# Patient Record
Sex: Female | Born: 1965
Health system: Southern US, Community
[De-identification: ages and names within clinical notes are randomized; demographics above are authoritative.]

## PROBLEM LIST (undated history)

## (undated) DIAGNOSIS — R059 Cough, unspecified: Secondary | ICD-10-CM

## (undated) DIAGNOSIS — D869 Sarcoidosis, unspecified: Secondary | ICD-10-CM

## (undated) DIAGNOSIS — K219 Gastro-esophageal reflux disease without esophagitis: Secondary | ICD-10-CM

## (undated) DIAGNOSIS — R05 Cough: Secondary | ICD-10-CM

## (undated) DIAGNOSIS — J45909 Unspecified asthma, uncomplicated: Secondary | ICD-10-CM

## (undated) DIAGNOSIS — I509 Heart failure, unspecified: Secondary | ICD-10-CM

## (undated) DIAGNOSIS — G43909 Migraine, unspecified, not intractable, without status migrainosus: Secondary | ICD-10-CM

## (undated) DIAGNOSIS — R0600 Dyspnea, unspecified: Secondary | ICD-10-CM

## (undated) DIAGNOSIS — Z9981 Dependence on supplemental oxygen: Secondary | ICD-10-CM

## (undated) DIAGNOSIS — I272 Pulmonary hypertension, unspecified: Secondary | ICD-10-CM

## (undated) HISTORY — PX: MULTIPLE TOOTH EXTRACTIONS: SHX2053

## (undated) HISTORY — PX: OTHER SURGICAL HISTORY: SHX169

---

## 2012-02-28 ENCOUNTER — Encounter (HOSPITAL_COMMUNITY): Payer: Self-pay | Admitting: Emergency Medicine

## 2012-02-28 ENCOUNTER — Emergency Department (INDEPENDENT_AMBULATORY_CARE_PROVIDER_SITE_OTHER)
Admission: EM | Admit: 2012-02-28 | Discharge: 2012-02-28 | Disposition: A | Discharge status: Home / Self Care | Payer: Self-pay | Source: Home / Self Care | Attending: Family Medicine | Admitting: Family Medicine

## 2012-02-28 DIAGNOSIS — J329 Chronic sinusitis, unspecified: Secondary | ICD-10-CM

## 2012-02-28 HISTORY — DX: Migraine, unspecified, not intractable, without status migrainosus: G43.909

## 2012-02-28 MED ORDER — AMOXICILLIN 500 MG PO CAPS: 500.0000 mg | ORAL_CAPSULE | Freq: Three times a day (TID) | ORAL | Status: DC

## 2012-02-28 MED ORDER — HYDROCODONE-ACETAMINOPHEN 5-325 MG PO TABS: 2.0000 | ORAL_TABLET | ORAL | Status: AC | PRN

## 2012-02-28 NOTE — ED Notes (Signed)
Pt c/o sinus pressure and pain. Since Sunday with some nasal  Drainage. Pt also c/o of gum pain left side top and bottom. Pt denies pain with teeth.

## 2012-02-28 NOTE — ED Provider Notes (Signed)
History     CSN: [id number]  Arrival date & time 02/28/12  1025   First MD Initiated Contact with Patient 02/28/12 1047      Chief Complaint  Patient presents with  . Facial Pain    sinus  pressure and left side face pain. and gum pain    (Consider location/radiation/quality/duration/timing/severity/associated sxs/prior treatment) Patient is a 46 y.o. female presenting with URI. The history is provided by the patient. No language interpreter was used.  URI The primary symptoms include headaches. Episode onset: 5 days. This is a new problem. The problem has been gradually worsening.  Symptoms associated with the illness include chills, facial pain and sinus pressure.    Past Medical History  Diagnosis Date  . Migraines     Past Surgical History  Procedure Date  . Tuballigation     History reviewed. No pertinent family history.  History  Substance Use Topics  . Smoking status: Never Smoker   . Smokeless tobacco: Not on file  . Alcohol Use: No    OB History    Grav Para Term Preterm Abortions TAB SAB Ect Mult Living                  Review of Systems  Constitutional: Positive for chills.  HENT: Positive for dental problem and sinus pressure.   Neurological: Positive for headaches.  All other systems reviewed and are negative.    Allergies  Review of patient's allergies indicates no known allergies.  Home Medications  No current outpatient prescriptions on file.  BP 129/88  Pulse 86  Temp 98.2 F (36.8 C) (Oral)  Resp 16  SpO2 98%  LMP 02/24/2012  Physical Exam  Nursing note and vitals reviewed. Constitutional: She appears well-developed and well-nourished.  HENT:  Head: Normocephalic and atraumatic.  Right Ear: External ear normal.  Left Ear: External ear normal.  Nose: Nose normal.  Mouth/Throat: Oropharynx is clear and moist.       Multiple broken teeth upper   Eyes: Conjunctivae normal and EOM are normal. Pupils are equal, round, and  reactive to light.  Neck: Normal range of motion. Neck supple.  Cardiovascular: Normal rate and regular rhythm.   Pulmonary/Chest: Effort normal and breath sounds normal.  Musculoskeletal: Normal range of motion.  Neurological: She is alert.  Skin: Skin is warm.    ED Course  Procedures (including critical care time)  Labs Reviewed - No data to display No results found.   No diagnosis found.    MDM  I will refer to dentist and treat for sinus infection        Fransico Meadow, [place] 02/28/12 1056

## 2012-07-15 ENCOUNTER — Emergency Department (HOSPITAL_COMMUNITY): Payer: No Typology Code available for payment source

## 2012-07-15 ENCOUNTER — Emergency Department (HOSPITAL_COMMUNITY)
Admission: EM | Admit: 2012-07-15 | Discharge: 2012-07-15 | Disposition: A | Discharge status: Home / Self Care | Payer: No Typology Code available for payment source | Attending: Emergency Medicine | Admitting: Emergency Medicine

## 2012-07-15 ENCOUNTER — Encounter (HOSPITAL_COMMUNITY): Payer: Self-pay | Admitting: Emergency Medicine

## 2012-07-15 DIAGNOSIS — M79642 Pain in left hand: Secondary | ICD-10-CM

## 2012-07-15 DIAGNOSIS — Y92410 Unspecified street and highway as the place of occurrence of the external cause: Secondary | ICD-10-CM | POA: Insufficient documentation

## 2012-07-15 DIAGNOSIS — Y9389 Activity, other specified: Secondary | ICD-10-CM | POA: Insufficient documentation

## 2012-07-15 DIAGNOSIS — Z8679 Personal history of other diseases of the circulatory system: Secondary | ICD-10-CM | POA: Insufficient documentation

## 2012-07-15 DIAGNOSIS — S4980XA Other specified injuries of shoulder and upper arm, unspecified arm, initial encounter: Secondary | ICD-10-CM | POA: Insufficient documentation

## 2012-07-15 DIAGNOSIS — S46909A Unspecified injury of unspecified muscle, fascia and tendon at shoulder and upper arm level, unspecified arm, initial encounter: Secondary | ICD-10-CM | POA: Insufficient documentation

## 2012-07-15 DIAGNOSIS — S6990XA Unspecified injury of unspecified wrist, hand and finger(s), initial encounter: Secondary | ICD-10-CM | POA: Insufficient documentation

## 2012-07-15 MED ORDER — NAPROXEN 500 MG PO TABS: 500.0000 mg | ORAL_TABLET | Freq: Two times a day (BID) | ORAL | Status: DC

## 2012-07-15 MED ORDER — HYDROCODONE-ACETAMINOPHEN 5-325 MG PO TABS: 2.0000 | ORAL_TABLET | ORAL | Status: DC | PRN

## 2012-07-15 MED ORDER — HYDROCODONE-ACETAMINOPHEN 5-325 MG PO TABS
2.0000 | ORAL_TABLET | Freq: Once | ORAL | Status: AC
  Administered 2012-07-15: 1 via ORAL
  Filled 2012-07-15: qty 2

## 2012-07-15 MED ORDER — CYCLOBENZAPRINE HCL 10 MG PO TABS: 10.0000 mg | ORAL_TABLET | Freq: Two times a day (BID) | ORAL | Status: DC | PRN
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
## 2012-07-15 NOTE — ED Provider Notes (Signed)
  Medical screening examination/treatment/procedure(s) were performed by non-physician practitioner and as supervising physician I was immediately available for consultation/collaboration.    Carmin Muskrat, MD 07/15/12 2121
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
## 2013-12-22 ENCOUNTER — Other Ambulatory Visit: Payer: Self-pay | Admitting: Obstetrics and Gynecology

## 2015-11-01 ENCOUNTER — Emergency Department (HOSPITAL_COMMUNITY): Payer: Medicaid Other

## 2015-11-01 ENCOUNTER — Ambulatory Visit (HOSPITAL_COMMUNITY)
Admission: EM | Admit: 2015-11-01 | Discharge: 2015-11-01 | Disposition: A | Discharge status: Nursing Home (Includes ICF) | Payer: Self-pay | Attending: Family Medicine | Admitting: Family Medicine

## 2015-11-01 ENCOUNTER — Inpatient Hospital Stay (HOSPITAL_COMMUNITY)
Admission: EM | Admit: 2015-11-01 | Discharge: 2015-11-04 | DRG: 189 | Disposition: A | Discharge status: Home / Self Care | Payer: Medicaid Other | Attending: Internal Medicine | Admitting: Internal Medicine

## 2015-11-01 ENCOUNTER — Encounter (HOSPITAL_COMMUNITY): Payer: Self-pay | Admitting: Emergency Medicine

## 2015-11-01 ENCOUNTER — Ambulatory Visit (INDEPENDENT_AMBULATORY_CARE_PROVIDER_SITE_OTHER): Payer: Self-pay

## 2015-11-01 ENCOUNTER — Encounter (HOSPITAL_COMMUNITY): Payer: Self-pay | Admitting: *Deleted

## 2015-11-01 DIAGNOSIS — R0602 Shortness of breath: Secondary | ICD-10-CM

## 2015-11-01 DIAGNOSIS — K219 Gastro-esophageal reflux disease without esophagitis: Secondary | ICD-10-CM | POA: Diagnosis present

## 2015-11-01 DIAGNOSIS — Z79899 Other long term (current) drug therapy: Secondary | ICD-10-CM

## 2015-11-01 DIAGNOSIS — J8410 Pulmonary fibrosis, unspecified: Secondary | ICD-10-CM | POA: Diagnosis present

## 2015-11-01 DIAGNOSIS — D869 Sarcoidosis, unspecified: Secondary | ICD-10-CM | POA: Diagnosis present

## 2015-11-01 DIAGNOSIS — R778 Other specified abnormalities of plasma proteins: Secondary | ICD-10-CM | POA: Diagnosis present

## 2015-11-01 DIAGNOSIS — IMO0001 Reserved for inherently not codable concepts without codable children: Secondary | ICD-10-CM

## 2015-11-01 DIAGNOSIS — J189 Pneumonia, unspecified organism: Secondary | ICD-10-CM

## 2015-11-01 DIAGNOSIS — Z87891 Personal history of nicotine dependence: Secondary | ICD-10-CM

## 2015-11-01 DIAGNOSIS — J45909 Unspecified asthma, uncomplicated: Secondary | ICD-10-CM

## 2015-11-01 DIAGNOSIS — D860 Sarcoidosis of lung: Secondary | ICD-10-CM

## 2015-11-01 DIAGNOSIS — J9601 Acute respiratory failure with hypoxia: Principal | ICD-10-CM | POA: Diagnosis present

## 2015-11-01 DIAGNOSIS — I248 Other forms of acute ischemic heart disease: Secondary | ICD-10-CM | POA: Diagnosis present

## 2015-11-01 DIAGNOSIS — E440 Moderate protein-calorie malnutrition: Secondary | ICD-10-CM | POA: Diagnosis present

## 2015-11-01 DIAGNOSIS — R7989 Other specified abnormal findings of blood chemistry: Secondary | ICD-10-CM

## 2015-11-01 DIAGNOSIS — Z23 Encounter for immunization: Secondary | ICD-10-CM

## 2015-11-01 DIAGNOSIS — I272 Other secondary pulmonary hypertension: Secondary | ICD-10-CM | POA: Diagnosis present

## 2015-11-01 DIAGNOSIS — Z8249 Family history of ischemic heart disease and other diseases of the circulatory system: Secondary | ICD-10-CM

## 2015-11-01 HISTORY — DX: Sarcoidosis, unspecified: D86.9

## 2015-11-01 HISTORY — DX: Gastro-esophageal reflux disease without esophagitis: K21.9

## 2015-11-01 LAB — CBC WITH DIFFERENTIAL/PLATELET
BASOS ABS: 0 10*3/uL (ref 0.0–0.1)
BASOS PCT: 0 %
EOS ABS: 0.1 10*3/uL (ref 0.0–0.7)
EOS PCT: 1 %
HEMATOCRIT: 48.7 % — AB (ref 36.0–46.0)
HEMOGLOBIN: 16 g/dL — AB (ref 12.0–15.0)
Lymphocytes Relative: 13 %
Lymphs Abs: 1.3 10*3/uL (ref 0.7–4.0)
MCH: 28.8 pg (ref 26.0–34.0)
MCHC: 32.9 g/dL (ref 30.0–36.0)
MCV: 87.6 fL (ref 78.0–100.0)
Monocytes Absolute: 0.6 10*3/uL (ref 0.1–1.0)
Monocytes Relative: 6 %
NEUTROS PCT: 80 %
Neutro Abs: 7.9 10*3/uL — ABNORMAL HIGH (ref 1.7–7.7)
Platelets: 397 10*3/uL (ref 150–400)
RBC: 5.56 MIL/uL — AB (ref 3.87–5.11)
RDW: 14.1 % (ref 11.5–15.5)
WBC: 9.9 10*3/uL (ref 4.0–10.5)

## 2015-11-01 LAB — I-STAT TROPONIN, ED: TROPONIN I, POC: 0.02 ng/mL (ref 0.00–0.08)

## 2015-11-01 LAB — BASIC METABOLIC PANEL
Anion gap: 10 (ref 5–15)
BUN: 10 mg/dL (ref 6–20)
CALCIUM: 9.1 mg/dL (ref 8.9–10.3)
CHLORIDE: 105 mmol/L (ref 101–111)
CO2: 21 mmol/L — AB (ref 22–32)
CREATININE: 1.01 mg/dL — AB (ref 0.44–1.00)
GFR calc Af Amer: >60 mL/min (ref >60)
GLUCOSE: 121 mg/dL — AB (ref 65–99)
Potassium: 3.9 mmol/L (ref 3.5–5.1)
Sodium: 136 mmol/L (ref 135–145)

## 2015-11-01 LAB — TROPONIN I: TROPONIN I: 0.05 ng/mL — AB (ref <0.03)

## 2015-11-01 MED ORDER — METHYLPREDNISOLONE SODIUM SUCC 125 MG IJ SOLR
125.0000 mg | Freq: Once | INTRAMUSCULAR | Status: AC
  Administered 2015-11-01: 125 mg via INTRAMUSCULAR

## 2015-11-01 MED ORDER — IOPAMIDOL (ISOVUE-370) INJECTION 76%
INTRAVENOUS | Status: AC
Start: 2015-11-01 — End: 2015-11-01
  Administered 2015-11-01: 100 mL
  Filled 2015-11-01: qty 100

## 2015-11-01 MED ORDER — ALBUTEROL SULFATE (2.5 MG/3ML) 0.083% IN NEBU
5.0000 mg | INHALATION_SOLUTION | Freq: Once | RESPIRATORY_TRACT | Status: AC
  Administered 2015-11-01: 5 mg via RESPIRATORY_TRACT
  Filled 2015-11-01: qty 6

## 2015-11-01 MED ORDER — GI COCKTAIL ~~LOC~~
30.0000 mL | Freq: Once | ORAL | Status: AC
  Administered 2015-11-01: 30 mL via ORAL

## 2015-11-01 MED ORDER — DEXTROSE 5 % IV SOLN
500.0000 mg | Freq: Once | INTRAVENOUS | Status: AC
  Administered 2015-11-02: 500 mg via INTRAVENOUS
  Filled 2015-11-01: qty 500

## 2015-11-01 MED ORDER — IPRATROPIUM BROMIDE 0.02 % IN SOLN
0.5000 mg | Freq: Once | RESPIRATORY_TRACT | Status: AC
  Administered 2015-11-01: 0.5 mg via RESPIRATORY_TRACT

## 2015-11-01 MED ORDER — METHYLPREDNISOLONE SODIUM SUCC 125 MG IJ SOLR
125.0000 mg | Freq: Once | INTRAMUSCULAR | Status: AC
  Administered 2015-11-02: 125 mg via INTRAVENOUS
  Filled 2015-11-01: qty 2

## 2015-11-01 MED ORDER — GI COCKTAIL ~~LOC~~
ORAL | Status: AC
  Filled 2015-11-01: qty 30

## 2015-11-01 MED ORDER — DEXTROSE 5 % IV SOLN
1.0000 g | Freq: Once | INTRAVENOUS | Status: AC
  Administered 2015-11-02: 1 g via INTRAVENOUS
  Filled 2015-11-01: qty 10

## 2015-11-01 MED ORDER — METHYLPREDNISOLONE SODIUM SUCC 125 MG IJ SOLR
INTRAMUSCULAR | Status: AC
  Filled 2015-11-01: qty 2

## 2015-11-01 MED ORDER — ALBUTEROL SULFATE (2.5 MG/3ML) 0.083% IN NEBU
INHALATION_SOLUTION | RESPIRATORY_TRACT | Status: AC
  Filled 2015-11-01: qty 6

## 2015-11-01 MED ORDER — ALBUTEROL SULFATE (2.5 MG/3ML) 0.083% IN NEBU
5.0000 mg | INHALATION_SOLUTION | Freq: Once | RESPIRATORY_TRACT | Status: AC
  Administered 2015-11-01: 5 mg via RESPIRATORY_TRACT

## 2015-11-01 MED ORDER — IPRATROPIUM BROMIDE 0.02 % IN SOLN
RESPIRATORY_TRACT | Status: AC
  Filled 2015-11-01: qty 2.5

## 2015-11-01 NOTE — Progress Notes (Signed)
RT called to pt's room for low spo2/increased HR. Pt on NRB at 10L when RT got to room with spo2 100%. Pt denies SOB at this time. Pt taken off NRB and placed on 5L [place] with spo2 remaining > 97%. RN at bedside and aware. Pt with clear/diminished BS throughout, RR approximately 20/min. RT will continue to monitor pt.

## 2015-11-01 NOTE — ED Notes (Signed)
Pt. Brought back from triage with report of RA sats around 78. 2L [place] applied, brought sats up to mid 80's, low 90's Non rebreather applied, 10L O2, respiratory called, Dr. Venora Maples notified. Respiratory therapist applied 5L [place], sats at 98%

## 2015-11-01 NOTE — ED Triage Notes (Signed)
Pt sts increased SOB over last couple of days; pt O2 sats noted to be 78% on RA

## 2015-11-01 NOTE — ED Notes (Signed)
Respiratory at bedside.

## 2015-11-01 NOTE — H&P (Signed)
History and Physical    Erin Zavala BUL:[id number] DOB: 1965-11-11 DOA: 11/01/2015  Referring MD/NP/PA:   PCP: No PCP Per Patient   Patient coming from:  The patient is coming from home.  At baseline, pt is independent for most of ADL.  Chief Complaint: Shortness of breath, cough  HPI: Erin Zavala is a 50 y.o. female with medical history significant of GERD, migraine headache, sarcoidosis, who presents with shortness of breath and cough  Patient reports that she has been having cough and shortness of breath for about 1 week, which has been progressively getting worse. No chest pain. She coughs up little amount of clear mucus. No fever, chills. Patient has a acid reflux symptoms of heartburn. She denies nausea, vomiting, abdominal pain, diarrhea, symptoms of UTI or unilateral weakness. Patient states that she was diagnosed as sarcoidosis in 1997 when she was treated for eye problems. Her vision has improved. Currently not on any medications for sarcoidosis.  ED Course: pt was found to have WBC 9.9, temperature normal, tachycardia, tachypnea, oxygen desaturation to 82% on room air, electrolytes and renal function okay. Chest x-ray the interstitial opacity. ABG with pH of 7.412, PCO2 34.7, PO2 121. Pt is admitted to telemetry bed as inpatient for further eval and treatment.  # CTA of the chest is negative for PE, but showed thoracic adenopathy and pulmonary fibrosis correlating with sarcoidosis history. Few patchy airspace opacities are likely related to the patient's chronic lung disease, but would correlate for pneumonia symptoms; probable pulmonary hypertension, presumably related to the fibrosis.  Review of Systems:   General: no fevers, chills, has loss of body weight, has poor appetite, has fatigue HEENT: no blurry vision, hearing changes or sore throat Respiratory: has dyspnea, coughing, no wheezing CV: no chest pain, no palpitations GI: no nausea, vomiting, abdominal pain, diarrhea,  constipation GU: no dysuria, burning on urination, increased urinary frequency, hematuria  Ext: no leg edema Neuro: no unilateral weakness, numbness, or tingling, no vision change or hearing loss Skin: no rash MSK: No muscle spasm, no deformity, no limitation of range of movement in spin Heme: No easy bruising.  Travel history: No recent long distant travel.  Allergy: No Known Allergies  Past Medical History:  Diagnosis Date  . GERD (gastroesophageal reflux disease)   . Migraines   . Sarcoidosis [facility])     Past Surgical History:  Procedure Laterality Date  . tuballigation      Social History:  reports that she has never smoked. She has never used smokeless tobacco. She reports that she does not drink alcohol or use drugs.  Family History:  Family History  Problem Relation Age of Onset  . Hypertension Mother   . Hypertension Father      Prior to Admission medications   Medication Sig Start Date End Date Taking? Authorizing Provider  cyclobenzaprine (FLEXERIL) 10 MG tablet Take 1 tablet (10 mg total) by mouth 2 (two) times daily as needed for muscle spasms. 07/15/12   Hannah Muthersbaugh, PA-C  HYDROcodone-acetaminophen (NORCO/VICODIN) 5-325 MG per tablet Take 2 tablets by mouth every 4 (four) hours as needed for pain. 07/15/12   Hannah Muthersbaugh, PA-C  naproxen (NAPROSYN) 500 MG tablet Take 1 tablet (500 mg total) by mouth 2 (two) times daily with a meal. 07/15/12   Hannah Muthersbaugh, PA-C  naproxen sodium (ANAPROX) 220 MG tablet Take 220 mg by mouth as needed (pain).    Historical Provider, MD    Physical Exam: Vitals:   11/02/15 0000 11/02/15 0002  11/02/15 0045 11/02/15 0136  BP: 109/90 115/84 118/87 119/87  Pulse: 93 103 97 99  Resp: 16 (!) 28  18  Temp:    97.5 F (36.4 C)  TempSrc:    Oral  SpO2: 100% 98% 100% 100%  Weight:    45.7 kg (100 lb 11.2 oz)   General: Not in acute distress HEENT:       Eyes: PERRL, EOMI, no scleral icterus.       ENT: No discharge  from the ears and nose, no pharynx injection, no tonsillar enlargement.        Neck: No JVD, no bruit, no mass felt. Heme: No neck lymph node enlargement. Cardiac: S1/S2, RRR, No murmurs, No gallops or rubs. Respiratory: Has decreased air movement bilaterally (L is worse). No rales, wheezing, rhonchi or rubs. GI: Soft, nondistended, nontender, no rebound pain, no organomegaly, BS present. GU: No hematuria Ext: No pitting leg edema bilaterally. 2+DP/PT pulse bilaterally. Musculoskeletal: No joint deformities, No joint redness or warmth, no limitation of ROM in spin. Skin: No rashes.  Neuro: Alert, oriented X3, cranial nerves II-XII grossly intact, moves all extremities normally. Psych: Patient is not psychotic, no suicidal or hemocidal ideation.  Labs on Admission: I have personally reviewed following labs and imaging studies  CBC:  Recent Labs Lab 11/01/15 1800  WBC 9.9  NEUTROABS 7.9*  HGB 16.0*  HCT 48.7*  MCV 87.6  PLT 387   Basic Metabolic Panel:  Recent Labs Lab 11/01/15 1800  NA 136  K 3.9  CL 105  CO2 21*  GLUCOSE 121*  BUN 10  CREATININE 1.01*  CALCIUM 9.1   GFR: Estimated Creatinine Clearance: 48.4 mL/min (by C-G formula based on SCr of 1.01 mg/dL). Liver Function Tests: No results for input(s): AST, ALT, ALKPHOS, BILITOT, PROT, ALBUMIN in the last 168 hours. No results for input(s): LIPASE, AMYLASE in the last 168 hours. No results for input(s): AMMONIA in the last 168 hours. Coagulation Profile:  Recent Labs Lab 11/02/15 0107  INR 1.12   Cardiac Enzymes:  Recent Labs Lab 11/01/15 1800 11/02/15 0107  TROPONINI 0.05* 0.04*   BNP (last 3 results) No results for input(s): PROBNP in the last 8760 hours. HbA1C: No results for input(s): HGBA1C in the last 72 hours. CBG: No results for input(s): GLUCAP in the last 168 hours. Lipid Profile: No results for input(s): CHOL, HDL, LDLCALC, TRIG, CHOLHDL, LDLDIRECT in the last 72 hours. Thyroid  Function Tests: No results for input(s): TSH, T4TOTAL, FREET4, T3FREE, THYROIDAB in the last 72 hours. Anemia Panel: No results for input(s): VITAMINB12, FOLATE, FERRITIN, TIBC, IRON, RETICCTPCT in the last 72 hours. Urine analysis: No results found for: COLORURINE, APPEARANCEUR, LABSPEC, PHURINE, GLUCOSEU, HGBUR, BILIRUBINUR, KETONESUR, PROTEINUR, UROBILINOGEN, NITRITE, LEUKOCYTESUR Sepsis Labs: _0 (procalcitonin:4,lacticidven:4) )No results found for this or any previous visit (from the past 240 hour(s)).   Radiological Exams on Admission: Dg Chest 2 View  Result Date: 11/01/2015 CLINICAL DATA:  Shortness of breath.  History of sarcoidosis. EXAM: CHEST  2 VIEW COMPARISON:  Chest radiograph 11/01/2015 FINDINGS: Superior mediastinal and hilar prominence may indicate underlying adenopathy. There are extensive interstitial opacities, predominantly within the upper lungs. No focal consolidation. No pneumothorax or pleural effusion. IMPRESSION: Interstitial opacities and likely mediastinal adenopathy, likely secondary to known sarcoidosis. Electronically Signed   By: Ulyses Jarred M.D.   On: 11/01/2015 19:36   Dg Chest 2 View  Result Date: 11/01/2015 CLINICAL DATA:  Shortness of breath, cough, history of asthma and sarcoidosis EXAM: CHEST  2 VIEW COMPARISON:  None. FINDINGS: Increased interstitial markings, upper lobe and left lower lobe predominant, likely corresponding to known sarcoidosis. No definite focal consolidation to suggest superimposed pneumonia. No pleural effusion or pneumothorax. Heart is normal in size. Visualized osseous structures are within normal limits. IMPRESSION: Suspected chronic interstitial markings related to known sarcoidosis. No findings suspicious for pneumonia. Electronically Signed   By: Julian Hy M.D.   On: 11/01/2015 16:31   Ct Angio Chest Pe W And/or Wo Contrast  Result Date: 11/01/2015 CLINICAL DATA:  Shortness of breath and tachycardia. History of  sarcoidosis. EXAM: CT ANGIOGRAPHY CHEST WITH CONTRAST TECHNIQUE: Multidetector CT imaging of the chest was performed using the standard protocol during bolus administration of intravenous contrast. Multiplanar CT image reconstructions and MIPs were obtained to evaluate the vascular anatomy. CONTRAST:  100 cc Isovue 370 intravenous COMPARISON:  None. FINDINGS: Cardiovascular: No cardiomegaly. No pericardial effusion. Enlarged main pulmonary artery measuring up to 31 mm, likely pulmonary hypertension. Study optimized for evaluation of the pulmonary arterial tree. No evidence of pulmonary embolism. Negative thoracic aorta. Mediastinum: Bulky mediastinal and hilar adenopathy. The subcarinal node is large enough to deform the left atrium. Findings attributed to patient's sarcoidosis history. Lungs/Pleura: Low volume lungs with apical predominant fibrotic interstitial opacities with central honeycombing. The lungs are diffusely ground-glass in appearance without Kerley lines are septal thickening. There are few patchy airspace type opacities in the right upper and right lower lobes. These could be fibrotic in nature but would correlate for any pneumonia symptoms. Upper abdomen: No acute findings. Hepatic artery branches are prominent and somewhat tortuous but there is no visible cirrhotic changes. Musculoskeletal: No chest wall mass or suspicious bone lesions identified. Review of the MIP images confirms the above findings. IMPRESSION: 1. Negative for pulmonary embolism. 2. Thoracic adenopathy and pulmonary fibrosis correlating with sarcoidosis history. Few patchy airspace opacities are likely related to the patient's chronic lung disease, but would correlate for pneumonia symptoms. 3. Probable pulmonary hypertension, presumably related to the fibrosis. Electronically Signed   By: Monte Fantasia M.D.   On: 11/01/2015 23:27     EKG: Independently reviewed. Sinus rhythm, QTC 456, T-wave inversion in V1-V5, and  III/aVF  Assessment/Plan Principal Problem:   Acute respiratory failure with hypoxia (HCC) Active Problems:   Sarcoidosis (HCC)   Elevated troponin   Pulmonary sarcoidosis (HCC)   GERD (gastroesophageal reflux disease)   Protein-calorie malnutrition, moderate (HCC)   Acute respiratory failure with hypoxia ([place]): Etiology is not clear. CTA of chest is negative for PE, but showed pulmonary fibrosis correlating with history of sarcoidosis and patchy airspace opacities. Patient does not have leukocytosis or fever, clinically does not have pneumonia, but cannot rule out atypical infection completely.  - Will admit to tele bed as inpt - IV Rocephin and azithromycin were given in ED, will keep azithromycin, and discontinue Rocephin - Mucinex for cough  - Solu-Medrol 60 mg twice a day - Atrovent nebs and prn Xopenex Neb prn for SOB - Urine legionella and S. pneumococcal antigen - Follow up blood culture x2, sputum culture and respiratory virus panel   Sarcoidosis: Patient states that she had vision disturbance initially when she was diagnosed on 1997. Her eye problem has resolved. She is taking any medications for sarcoidosis. No follow-up in the past. 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 Calcium 9.1 on admission - may consult to pulmonology in morning  Elevated troponin: Troponin 0.05. No chest pain, but has diffused T-wave inversion on EKG. Likely  due to demand ischemia secondary to tachycardia and hypoxia, I cannot rule out NSTEMI completely. - cycle CE q6 x3 and repeat her EKG in the am  - Aspirin - Risk factor stratification: will check FLP, UDS and A1C  - 2d echo - please call Card in AM  GERD: -Protonix  Protein-calorie malnutrition, moderate ([place]): -Consultation  DVT ppx:  SQ Lovenox Code Status: Full code Family Communication: Yes, patient's daughter and niece at bed side Disposition Plan:  Anticipate discharge back to previous home environment Consults  called:  none Admission status: Inpatient/tele  Date of Service 11/02/2015    Ivor Costa [facility] Pager [phone]  If 7PM-7AM, please contact night-coverage [email or web address] Password [facility] 11/02/2015, 2:27 AM

## 2015-11-01 NOTE — ED Notes (Signed)
Dr. Venora Maples at bedside

## 2015-11-01 NOTE — ED Provider Notes (Signed)
[place]    CSN: [id number] Arrival date & time: 11/01/15  1539  First Provider Contact:  First MD Initiated Contact with Patient 11/01/15 1603        History   Chief Complaint Chief Complaint  Patient presents with  . Cough    HPI Erin Zavala is a 50 y.o. female.    Shortness of Breath  Severity:  Moderate Onset quality:  Gradual Duration:  2 weeks Progression:  Worsening Chronicity:  Chronic Context comment:  Sx worse with gerd. just finished course of nexium Ineffective treatments:  Inhaler Associated symptoms: no fever and no wheezing   Risk factors comment:  H/o sarcoidosis   Past Medical History:  Diagnosis Date  . Migraines     There are no active problems to display for this patient.   Past Surgical History:  Procedure Laterality Date  . tuballigation      OB History    No data available       Home Medications    Prior to Admission medications   Medication Sig Start Date End Date Taking? Authorizing Provider  cyclobenzaprine (FLEXERIL) 10 MG tablet Take 1 tablet (10 mg total) by mouth 2 (two) times daily as needed for muscle spasms. 07/15/12   Hannah Muthersbaugh, PA-C  HYDROcodone-acetaminophen (NORCO/VICODIN) 5-325 MG per tablet Take 2 tablets by mouth every 4 (four) hours as needed for pain. 07/15/12   Hannah Muthersbaugh, PA-C  naproxen (NAPROSYN) 500 MG tablet Take 1 tablet (500 mg total) by mouth 2 (two) times daily with a meal. 07/15/12   Hannah Muthersbaugh, PA-C  naproxen sodium (ANAPROX) 220 MG tablet Take 220 mg by mouth as needed (pain).    Historical Provider, MD    Family History Family History  Problem Relation Age of Onset  . Hypertension Mother   . Hypertension Father     Social History Social History  Substance Use Topics  . Smoking status: Never Smoker  . Smokeless tobacco: Not on file  . Alcohol use No     Allergies   Review of patient's allergies indicates no known allergies.   Review of  Systems Review of Systems  Constitutional: Negative for fever.  Respiratory: Positive for shortness of breath. Negative for wheezing.   Cardiovascular: Negative.   Gastrointestinal: Negative.   All other systems reviewed and are negative.    Physical Exam Triage Vital Signs ED Triage Vitals [11/01/15 1559]  Enc Vitals Group     BP 115/81     Pulse Rate (!) 130     Resp 22     Temp 97.5 F (36.4 C)     Temp Source Oral     SpO2 (!) 87 %     Weight 102 lb (46.3 kg)     Height 5' (1.524 m)     Head Circumference      Peak Flow      Pain Score      Pain Loc      Pain Edu?      Excl. in [place]?    No data found.   Updated Vital Signs BP 115/81 (BP Location: Left Arm)   Pulse (!) 130 Comment: checked both hands  Temp 97.5 F (36.4 C) (Oral)   Resp 22 Comment: breathing swallow and fast, Pt states unable to take a normal breath or she'll start to cough.  Ht 5' (1.524 m)   Wt 102 lb (46.3 kg)   LMP 09/11/2015 Comment: perimenapausal  SpO2 (!) 87% Comment:  RN Nicole Kindred informed of Pts Spo2 highest reading from both hands  BMI 19.92 kg/m   Visual Acuity Right Eye Distance:   Left Eye Distance:   Bilateral Distance:    Right Eye Near:   Left Eye Near:    Bilateral Near:     Physical Exam  Constitutional: She is oriented to person, place, and time. She appears well-developed and well-nourished. She appears distressed.  Cardiovascular: Normal rate, regular rhythm, normal heart sounds and intact distal pulses.   Pulmonary/Chest: Breath sounds normal. She is in respiratory distress. She has no wheezes. She has no rales. She exhibits no tenderness.  Abdominal: Soft. Bowel sounds are normal.  Neurological: She is alert and oriented to person, place, and time.  Skin: Skin is warm and dry.  Nursing note and vitals reviewed.    UC Treatments / Results  Labs (all labs ordered are listed, but only abnormal results are displayed) Labs Reviewed - No data to display  EKG  EKG  Interpretation None       Radiology Dg Chest 2 View  Result Date: 11/01/2015 CLINICAL DATA:  Shortness of breath, cough, history of asthma and sarcoidosis EXAM: CHEST  2 VIEW COMPARISON:  None. FINDINGS: Increased interstitial markings, upper lobe and left lower lobe predominant, likely corresponding to known sarcoidosis. No definite focal consolidation to suggest superimposed pneumonia. No pleural effusion or pneumothorax. Heart is normal in size. Visualized osseous structures are within normal limits. IMPRESSION: Suspected chronic interstitial markings related to known sarcoidosis. No findings suspicious for pneumonia. Electronically Signed   By: Julian Hy M.D.   On: 11/01/2015 16:31  X-rays reviewed and report per radiologist.   Procedures Procedures (including critical care time)  Medications Ordered in UC Medications  gi cocktail (Maalox,Lidocaine,Donnatal) (30 mLs Oral Given 11/01/15 1610)  methylPREDNISolone sodium succinate (SOLU-MEDROL) 125 mg/2 mL injection 125 mg (125 mg Intramuscular Given 11/01/15 1610)  albuterol (PROVENTIL) (2.5 MG/3ML) 0.083% nebulizer solution 5 mg (5 mg Nebulization Given 11/01/15 1610)  ipratropium (ATROVENT) nebulizer solution 0.5 mg (0.5 mg Nebulization Given 11/01/15 1611)     Initial Impression / Assessment and Plan / UC Course  I have reviewed the triage vital signs and the nursing notes.  Pertinent labs & imaging results that were available during my care of the patient were reviewed by me and considered in my medical decision making (see chart for details).  Clinical Course    Sent for pulmonary eval of pulmonary sarcoidosis  Final Clinical Impressions(s) / UC Diagnoses   Final diagnoses:  Shortness of breath dyspnea  Sarcoidosis, lung (HCC)    New Prescriptions New Prescriptions   No medications on file     Billy Fischer, MD 11/01/15 1641

## 2015-11-01 NOTE — ED Provider Notes (Signed)
[facility] DEPT Provider Note   CSN: [id number] Arrival date & time: 11/01/15  1718     History   Chief Complaint Chief Complaint  Patient presents with  . Shortness of Breath    HPI Erin Zavala is a 50 y.o. female.  HPI Patient presents emergency department with worsening shortness of breath over the past several months but now with significant worsening over the past week and ongoing productive cough.  She's tried an albuterol inhaler without improvement in her symptoms.  She was seen at the urgent care and sent to the ER for further evaluation given her tachycardia to the 140s had significant shortness of breath.  No history DVT or pulmonary embolism.  She was diagnosed with sarcoidosis in her eyes in the 90s.  She's never followed up with anyone about the possibility of sarcoid elsewhere.  She does not have a primary care physician.    Past Medical History:  Diagnosis Date  . Migraines   . Sarcoidosis ([place])     There are no active problems to display for this patient.   Past Surgical History:  Procedure Laterality Date  . tuballigation      OB History    No data available       Home Medications    Prior to Admission medications   Medication Sig Start Date End Date Taking? Authorizing Provider  cyclobenzaprine (FLEXERIL) 10 MG tablet Take 1 tablet (10 mg total) by mouth 2 (two) times daily as needed for muscle spasms. 07/15/12   Hannah Muthersbaugh, PA-C  HYDROcodone-acetaminophen (NORCO/VICODIN) 5-325 MG per tablet Take 2 tablets by mouth every 4 (four) hours as needed for pain. 07/15/12   Hannah Muthersbaugh, PA-C  naproxen (NAPROSYN) 500 MG tablet Take 1 tablet (500 mg total) by mouth 2 (two) times daily with a meal. 07/15/12   Hannah Muthersbaugh, PA-C  naproxen sodium (ANAPROX) 220 MG tablet Take 220 mg by mouth as needed (pain).    Historical Provider, MD    Family History Family History  Problem Relation Age of Onset  . Hypertension Mother   .  Hypertension Father     Social History Social History  Substance Use Topics  . Smoking status: Never Smoker  . Smokeless tobacco: Never Used  . Alcohol use No     Allergies   Review of patient's allergies indicates no known allergies.   Review of Systems Review of Systems  All other systems reviewed and are negative.    Physical Exam Updated Vital Signs BP 104/77   Pulse 111   Resp (!) 28   LMP 10/17/2015 Comment: perimenapausal  SpO2 100%   Physical Exam  Constitutional: She is oriented to person, place, and time. She appears well-developed and well-nourished. No distress.  HENT:  Head: Normocephalic and atraumatic.  Eyes: EOM are normal.  Neck: Normal range of motion.  Cardiovascular: Regular rhythm and normal heart sounds.   Tachycardic  Pulmonary/Chest:  Tachypnea.  Accessory muscle use.  Decreased breath sounds in the bases.  No obvious wheezing.  Abdominal: Soft. She exhibits no distension. There is no tenderness.  Musculoskeletal: Normal range of motion.  Neurological: She is alert and oriented to person, place, and time.  Skin: Skin is warm and dry.  Psychiatric: She has a normal mood and affect. Judgment normal.  Nursing note and vitals reviewed.    ED Treatments / Results  Labs (all labs ordered are listed, but only abnormal results are displayed) Labs Reviewed  CBC WITH DIFFERENTIAL/PLATELET - Abnormal;  Notable for the following:       Result Value   RBC 5.56 (*)    Hemoglobin 16.0 (*)    HCT 48.7 (*)    Neutro Abs 7.9 (*)    All other components within normal limits  BASIC METABOLIC PANEL - Abnormal; Notable for the following:    CO2 21 (*)    Glucose, Bld 121 (*)    Creatinine, Ser 1.01 (*)    All other components within normal limits  TROPONIN I - Abnormal; Notable for the following:    Troponin I 0.05 (*)    All other components within normal limits  I-STAT TROPOININ, ED    EKG  EKG Interpretation  Date/Time:  Tuesday November 01 2015 17:47:01 EDT Ventricular Rate:  122 PR Interval:  126 QRS Duration: 70 QT Interval:  320 QTC Calculation: 456 R Axis:   71 Text Interpretation:  Sinus tachycardia Right atrial enlargement ST & T wave abnormality, consider anterior ischemia Abnormal ECG No old tracing to compare Confirmed by Brittnei Jagiello  MD, Lennette Bihari ([id number]) on 11/01/2015 7:16:02 PM       Radiology Dg Chest 2 View  Result Date: 11/01/2015 CLINICAL DATA:  Shortness of breath.  History of sarcoidosis. EXAM: CHEST  2 VIEW COMPARISON:  Chest radiograph 11/01/2015 FINDINGS: Superior mediastinal and hilar prominence may indicate underlying adenopathy. There are extensive interstitial opacities, predominantly within the upper lungs. No focal consolidation. No pneumothorax or pleural effusion. IMPRESSION: Interstitial opacities and likely mediastinal adenopathy, likely secondary to known sarcoidosis. Electronically Signed   By: Ulyses Jarred M.D.   On: 11/01/2015 19:36   Dg Chest 2 View  Result Date: 11/01/2015 CLINICAL DATA:  Shortness of breath, cough, history of asthma and sarcoidosis EXAM: CHEST  2 VIEW COMPARISON:  None. FINDINGS: Increased interstitial markings, upper lobe and left lower lobe predominant, likely corresponding to known sarcoidosis. No definite focal consolidation to suggest superimposed pneumonia. No pleural effusion or pneumothorax. Heart is normal in size. Visualized osseous structures are within normal limits. IMPRESSION: Suspected chronic interstitial markings related to known sarcoidosis. No findings suspicious for pneumonia. Electronically Signed   By: Julian Hy M.D.   On: 11/01/2015 16:31   Ct Angio Chest Pe W And/or Wo Contrast  Result Date: 11/01/2015 CLINICAL DATA:  Shortness of breath and tachycardia. History of sarcoidosis. EXAM: CT ANGIOGRAPHY CHEST WITH CONTRAST TECHNIQUE: Multidetector CT imaging of the chest was performed using the standard protocol during bolus administration of intravenous  contrast. Multiplanar CT image reconstructions and MIPs were obtained to evaluate the vascular anatomy. CONTRAST:  100 cc Isovue 370 intravenous COMPARISON:  None. FINDINGS: Cardiovascular: No cardiomegaly. No pericardial effusion. Enlarged main pulmonary artery measuring up to 31 mm, likely pulmonary hypertension. Study optimized for evaluation of the pulmonary arterial tree. No evidence of pulmonary embolism. Negative thoracic aorta. Mediastinum: Bulky mediastinal and hilar adenopathy. The subcarinal node is large enough to deform the left atrium. Findings attributed to patient's sarcoidosis history. Lungs/Pleura: Low volume lungs with apical predominant fibrotic interstitial opacities with central honeycombing. The lungs are diffusely ground-glass in appearance without Kerley lines are septal thickening. There are few patchy airspace type opacities in the right upper and right lower lobes. These could be fibrotic in nature but would correlate for any pneumonia symptoms. Upper abdomen: No acute findings. Hepatic artery branches are prominent and somewhat tortuous but there is no visible cirrhotic changes. Musculoskeletal: No chest wall mass or suspicious bone lesions identified. Review of the MIP images confirms the above  findings. IMPRESSION: 1. Negative for pulmonary embolism. 2. Thoracic adenopathy and pulmonary fibrosis correlating with sarcoidosis history. Few patchy airspace opacities are likely related to the patient's chronic lung disease, but would correlate for pneumonia symptoms. 3. Probable pulmonary hypertension, presumably related to the fibrosis. Electronically Signed   By: Monte Fantasia M.D.   On: 11/01/2015 23:27    Procedures Procedures (including critical care time)  Medications Ordered in ED Medications  cefTRIAXone (ROCEPHIN) 1 g in dextrose 5 % 50 mL IVPB (not administered)  azithromycin (ZITHROMAX) 500 mg in dextrose 5 % 250 mL IVPB (not administered)  methylPREDNISolone sodium  succinate (SOLU-MEDROL) 125 mg/2 mL injection 125 mg (not administered)  albuterol (PROVENTIL) (2.5 MG/3ML) 0.083% nebulizer solution 5 mg (5 mg Nebulization Given 11/01/15 1840)  iopamidol (ISOVUE-370) 76 % injection (100 mLs  Contrast Given 11/01/15 2216)     Initial Impression / Assessment and Plan / ED Course  I have reviewed the triage vital signs and the nursing notes.  Pertinent labs & imaging results that were available during my care of the patient were reviewed by me and considered in my medical decision making (see chart for details).  Clinical Course    Patient likely pulmonary sarcoid has evidence on CT.  No evidence of pulmonary embolism.  There is questionable infiltrates to suggest community acquired pneumonia.  She has had significant worsening of her breathing in the past week with productive cough.  She'll be started on IV antibiotics for community associated pneumonia.  IV Solu-Medrol now.  She's never had a diagnosis of pulmonary sarcoid.  She'll benefit from a pulmonary consultation.  She does not have a primary care physician.  Patient will be admitted overnight for ongoing evaluation and treatment.  Improved after bronchodilators in the ER.  Final Clinical Impressions(s) / ED Diagnoses   Final diagnoses:  SOB (shortness of breath)  Sarcoidosis ([place])  CAP (community acquired pneumonia)    New Prescriptions New Prescriptions   No medications on file     Jola Schmidt, MD 11/01/15 2349

## 2015-11-01 NOTE — ED Triage Notes (Signed)
Pt  Reports   Some   Shortness  Of  Breath  With  A  Non  Productive       Cough          With  Symptoms     Since  Yesterday  Pt  Has a  History  Of  Sarcoidosis

## 2015-11-01 NOTE — ED Notes (Addendum)
Patient transported to CT 

## 2015-11-01 NOTE — ED Notes (Signed)
Dr. Venora Maples aware of the pts troponin

## 2015-11-01 NOTE — ED Notes (Signed)
Report  Phoned   To     [facility]     Triage  nurse

## 2015-11-02 ENCOUNTER — Inpatient Hospital Stay (HOSPITAL_COMMUNITY): Payer: Medicaid Other

## 2015-11-02 ENCOUNTER — Encounter (HOSPITAL_COMMUNITY): Payer: Self-pay | Admitting: Internal Medicine

## 2015-11-02 DIAGNOSIS — R778 Other specified abnormalities of plasma proteins: Secondary | ICD-10-CM | POA: Diagnosis present

## 2015-11-02 DIAGNOSIS — R7989 Other specified abnormal findings of blood chemistry: Secondary | ICD-10-CM

## 2015-11-02 DIAGNOSIS — E440 Moderate protein-calorie malnutrition: Secondary | ICD-10-CM

## 2015-11-02 DIAGNOSIS — J8410 Pulmonary fibrosis, unspecified: Secondary | ICD-10-CM | POA: Diagnosis present

## 2015-11-02 DIAGNOSIS — K219 Gastro-esophageal reflux disease without esophagitis: Secondary | ICD-10-CM

## 2015-11-02 DIAGNOSIS — D860 Sarcoidosis of lung: Secondary | ICD-10-CM | POA: Diagnosis present

## 2015-11-02 DIAGNOSIS — Z23 Encounter for immunization: Secondary | ICD-10-CM | POA: Diagnosis not present

## 2015-11-02 DIAGNOSIS — I272 Other secondary pulmonary hypertension: Secondary | ICD-10-CM | POA: Diagnosis present

## 2015-11-02 DIAGNOSIS — Z87891 Personal history of nicotine dependence: Secondary | ICD-10-CM | POA: Diagnosis not present

## 2015-11-02 DIAGNOSIS — Z8249 Family history of ischemic heart disease and other diseases of the circulatory system: Secondary | ICD-10-CM | POA: Diagnosis not present

## 2015-11-02 DIAGNOSIS — J9601 Acute respiratory failure with hypoxia: Principal | ICD-10-CM

## 2015-11-02 DIAGNOSIS — D869 Sarcoidosis, unspecified: Secondary | ICD-10-CM | POA: Diagnosis present

## 2015-11-02 DIAGNOSIS — J45909 Unspecified asthma, uncomplicated: Secondary | ICD-10-CM

## 2015-11-02 DIAGNOSIS — Z79899 Other long term (current) drug therapy: Secondary | ICD-10-CM | POA: Diagnosis not present

## 2015-11-02 DIAGNOSIS — I248 Other forms of acute ischemic heart disease: Secondary | ICD-10-CM | POA: Diagnosis present

## 2015-11-02 LAB — LIPID PANEL
CHOL/HDL RATIO: 5.5 ratio
Cholesterol: 218 mg/dL — ABNORMAL HIGH (ref 0–200)
HDL: 40 mg/dL — ABNORMAL LOW (ref >40)
LDL CALC: 164 mg/dL — AB (ref 0–99)
Triglycerides: 68 mg/dL (ref <150)
VLDL: 14 mg/dL (ref 0–40)

## 2015-11-02 LAB — I-STAT ARTERIAL BLOOD GAS, ED
Acid-base deficit: 2 mmol/L (ref 0.0–2.0)
Bicarbonate: 22.1 mEq/L (ref 20.0–24.0)
O2 SAT: 99 %
PCO2 ART: 34.7 mmHg — AB (ref 35.0–45.0)
TCO2: 23 mmol/L (ref 0–100)
pH, Arterial: 7.412 (ref 7.350–7.450)
pO2, Arterial: 121 mmHg — ABNORMAL HIGH (ref 80.0–100.0)

## 2015-11-02 LAB — RAPID URINE DRUG SCREEN, HOSP PERFORMED
Amphetamines: NOT DETECTED
BARBITURATES: POSITIVE — AB
BENZODIAZEPINES: NOT DETECTED
COCAINE: NOT DETECTED
Opiates: NOT DETECTED
TETRAHYDROCANNABINOL: NOT DETECTED

## 2015-11-02 LAB — HIV ANTIBODY (ROUTINE TESTING W REFLEX): HIV SCREEN 4TH GENERATION: NONREACTIVE

## 2015-11-02 LAB — APTT: APTT: 32 s (ref 24–36)

## 2015-11-02 LAB — PROCALCITONIN

## 2015-11-02 LAB — ECHOCARDIOGRAM COMPLETE
HEIGHTINCHES: 60 in
WEIGHTICAEL: 1611.2 [oz_av]

## 2015-11-02 LAB — TROPONIN I
TROPONIN I: 0.04 ng/mL — AB (ref <0.03)
TROPONIN I: 0.04 ng/mL — AB (ref <0.03)
Troponin I: <0.03 ng/mL (ref <0.03)

## 2015-11-02 LAB — PROTIME-INR
INR: 1.12
Prothrombin Time: 14.5 seconds (ref 11.4–15.2)

## 2015-11-02 LAB — STREP PNEUMONIAE URINARY ANTIGEN: STREP PNEUMO URINARY ANTIGEN: NEGATIVE

## 2015-11-02 MED ORDER — LORATADINE 10 MG PO TABS
10.0000 mg | ORAL_TABLET | Freq: Every day | ORAL | Status: DC
  Administered 2015-11-02 – 2015-11-04 (×3): 10 mg via ORAL
  Filled 2015-11-02 (×3): qty 1

## 2015-11-02 MED ORDER — ASPIRIN 325 MG PO TABS
325.0000 mg | ORAL_TABLET | Freq: Every day | ORAL | Status: DC
  Administered 2015-11-02 – 2015-11-04 (×3): 325 mg via ORAL
  Filled 2015-11-02 (×3): qty 1

## 2015-11-02 MED ORDER — CETYLPYRIDINIUM CHLORIDE 0.05 % MT LIQD
7.0000 mL | Freq: Two times a day (BID) | OROMUCOSAL | Status: DC
  Administered 2015-11-02 – 2015-11-03 (×3): 7 mL via OROMUCOSAL

## 2015-11-02 MED ORDER — ENSURE ENLIVE PO LIQD
237.0000 mL | Freq: Three times a day (TID) | ORAL | Status: DC
  Administered 2015-11-03 – 2015-11-04 (×5): 237 mL via ORAL

## 2015-11-02 MED ORDER — PANTOPRAZOLE SODIUM 40 MG PO TBEC
40.0000 mg | DELAYED_RELEASE_TABLET | Freq: Every day | ORAL | Status: DC
  Administered 2015-11-02 – 2015-11-04 (×3): 40 mg via ORAL
  Filled 2015-11-02 (×3): qty 1

## 2015-11-02 MED ORDER — CYCLOBENZAPRINE HCL 10 MG PO TABS: 10.0000 mg | ORAL_TABLET | Freq: Two times a day (BID) | ORAL | Status: DC | PRN

## 2015-11-02 MED ORDER — SODIUM CHLORIDE 0.9 % IV SOLN
INTRAVENOUS | Status: DC
  Administered 2015-11-02: 100 mL/h via INTRAVENOUS
  Administered 2015-11-03 (×2): via INTRAVENOUS

## 2015-11-02 MED ORDER — IPRATROPIUM BROMIDE 0.02 % IN SOLN
0.5000 mg | Freq: Three times a day (TID) | RESPIRATORY_TRACT | Status: DC
  Administered 2015-11-02 – 2015-11-04 (×8): 0.5 mg via RESPIRATORY_TRACT
  Filled 2015-11-02 (×8): qty 2.5

## 2015-11-02 MED ORDER — PNEUMOCOCCAL VAC POLYVALENT 25 MCG/0.5ML IJ INJ
0.5000 mL | INJECTION | INTRAMUSCULAR | Status: AC
  Administered 2015-11-03: 0.5 mL via INTRAMUSCULAR
  Filled 2015-11-02: qty 0.5

## 2015-11-02 MED ORDER — LEVALBUTEROL HCL 1.25 MG/0.5ML IN NEBU
1.2500 mg | INHALATION_SOLUTION | Freq: Three times a day (TID) | RESPIRATORY_TRACT | Status: DC
  Administered 2015-11-02 – 2015-11-04 (×8): 1.25 mg via RESPIRATORY_TRACT
  Filled 2015-11-02 (×8): qty 0.5

## 2015-11-02 MED ORDER — IPRATROPIUM BROMIDE 0.02 % IN SOLN
0.5000 mg | RESPIRATORY_TRACT | Status: DC
  Administered 2015-11-02: 0.5 mg via RESPIRATORY_TRACT
  Filled 2015-11-02: qty 2.5

## 2015-11-02 MED ORDER — ENOXAPARIN SODIUM 40 MG/0.4ML ~~LOC~~ SOLN
40.0000 mg | SUBCUTANEOUS | Status: DC
  Administered 2015-11-02 – 2015-11-04 (×3): 40 mg via SUBCUTANEOUS
  Filled 2015-11-02 (×3): qty 0.4

## 2015-11-02 MED ORDER — LEVALBUTEROL HCL 1.25 MG/0.5ML IN NEBU
1.2500 mg | INHALATION_SOLUTION | Freq: Four times a day (QID) | RESPIRATORY_TRACT | Status: DC
  Administered 2015-11-02: 1.25 mg via RESPIRATORY_TRACT
  Filled 2015-11-02 (×2): qty 0.5

## 2015-11-02 MED ORDER — METHYLPREDNISOLONE SODIUM SUCC 125 MG IJ SOLR
60.0000 mg | Freq: Two times a day (BID) | INTRAMUSCULAR | Status: DC
  Administered 2015-11-02 – 2015-11-03 (×3): 60 mg via INTRAVENOUS
  Filled 2015-11-02 (×3): qty 2

## 2015-11-02 MED ORDER — DM-GUAIFENESIN ER 30-600 MG PO TB12: 1.0000 | ORAL_TABLET | Freq: Two times a day (BID) | ORAL | Status: DC | PRN

## 2015-11-02 MED ORDER — DEXTROSE 5 % IV SOLN: 1.0000 g | INTRAVENOUS | Status: DC

## 2015-11-02 MED ORDER — DEXTROSE 5 % IV SOLN
500.0000 mg | INTRAVENOUS | Status: DC
Start: 2015-11-02 — End: 2015-11-04
  Administered 2015-11-02 – 2015-11-03 (×2): 500 mg via INTRAVENOUS
  Filled 2015-11-02 (×3): qty 500

## 2015-11-02 MED ORDER — SALINE SPRAY 0.65 % NA SOLN
1.0000 | NASAL | Status: DC | PRN
  Administered 2015-11-02: 1 via NASAL
  Filled 2015-11-02: qty 44

## 2015-11-02 MED ORDER — HYDROCODONE-ACETAMINOPHEN 5-325 MG PO TABS: 2.0000 | ORAL_TABLET | ORAL | Status: DC | PRN

## 2015-11-02 NOTE — Progress Notes (Signed)
PROGRESS NOTE    Erin Zavala  ZOX:[id number] DOB: 1965/07/15 DOA: 11/01/2015 PCP: No PCP Per Patient   Chief Complaint  Patient presents with  . Shortness of Breath    Brief Narrative:  HPI On 11/01/2015 by Dr. Ivor Costa Martika Zavala is a 50 y.o. female with medical history significant of GERD, migraine headache, sarcoidosis, who presents with shortness of breath and cough Patient reports that she has been having cough and shortness of breath for about 1 week, which has been progressively getting worse. No chest pain. She coughs up little amount of clear mucus. No fever, chills. Patient has a acid reflux symptoms of heartburn. She denies nausea, vomiting, abdominal pain, diarrhea, symptoms of UTI or unilateral weakness. Patient states that she was diagnosed as sarcoidosis in 1997 when she was treated for eye problems. Her vision has improved. Currently not on any medications for sarcoidosis.  Assessment & Plan  Acute respiratory failure with hypoxia  -Unknown etiology  -Upon admission, SPO2 82% -CTA of chest is negative for PE, but showed pulmonary fibrosis correlating with history of sarcoidosis and patchy airspace opacities.  -Currently afebrile with no leukocytosis  -In. Clinically placed on IV azithromycin (1 dose of ceftriaxone given) -Continue Mucinex, slight mitral, nebulizer treatments -Continue supplemental oxygen -Respiratory viral panel pending  Sarcoidosis -Patient states that she had vision disturbance initially when she was diagnosed on 1997.  Her eye problem has resolved. She is not taking any medications for sarcoidosis. No follow-up in the past. 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  -Calcium 9.1 on admission  Elevated troponin -Troponin 0.05. However trending downward No chest pain, but has diffused T-wave inversion on EKG.  -Likely due to demand ischemia secondary to tachycardia and hypoxia -Continue aspirin -Echocardiogram: EF 55-60%, grade 1  diastolic dysfunction, PA peak pressure 60 mmHg  GERD -Protonix  Protein-calorie malnutrition, moderate  -Nutrition consulted  DVT Prophylaxis  Lovenox  Code Status: Full   Family Communication: none at bedside  Disposition Plan: Admitted  Consultants None  Procedures  Echocardiogram  Antibiotics   Anti-infectives    Start     Dose/Rate Route Frequency Ordered Stop   11/02/15 2300  cefTRIAXone (ROCEPHIN) 1 g in dextrose 5 % 50 mL IVPB  Status:  Discontinued     1 g 100 mL/hr over 30 Minutes Intravenous Every 24 hours 11/02/15 0019 11/02/15 0234   11/02/15 2300  azithromycin (ZITHROMAX) 500 mg in dextrose 5 % 250 mL IVPB     500 mg 250 mL/hr over 60 Minutes Intravenous Every 24 hours 11/02/15 0019 11/08/15 2259   11/01/15 2345  cefTRIAXone (ROCEPHIN) 1 g in dextrose 5 % 50 mL IVPB     1 g 100 mL/hr over 30 Minutes Intravenous  Once 11/01/15 2343 11/02/15 0110   11/01/15 2345  azithromycin (ZITHROMAX) 500 mg in dextrose 5 % 250 mL IVPB     500 mg 250 mL/hr over 60 Minutes Intravenous  Once 11/01/15 2343 11/02/15 0142      Subjective:   Katharine Look Slappey seen and examined today. Patient feels breathing is improved. Denies any current chest pain, abdominal pain, nausea, vomiting, and diarrhea or constipation.   Objective:   Vitals:   11/02/15 0136 11/02/15 0426 11/02/15 0820 11/02/15 0939  BP: 119/87 111/79 102/79   Pulse: 99 93 91 92  Resp: _0 Temp: 97.5 F (36.4 C) 97.5 F (36.4 C) 97.7 F (36.5 C)   TempSrc: Oral Oral Axillary   SpO2:  100% 100% 100% 100%  Weight: 45.7 kg (100 lb 11.2 oz)       Intake/Output Summary (Last 24 hours) at 11/02/15 1400 Last data filed at 11/02/15 0953  Gross per 24 hour  Intake                0 ml  Output              250 ml  Net             -250 ml   Filed Weights   11/02/15 0136  Weight: 45.7 kg (100 lb 11.2 oz)    Exam  General: Well developed, thin, NAD  HEENT: NCAT, , mucous membranes moist.    Cardiovascular: S1 S2 auscultated, no murmurs, RRR  Respiratory: Diminished but clear, occasional cough.  Abdomen: Soft, nontender, nondistended, + bowel sounds  Extremities: warm dry without cyanosis clubbing or edema  Neuro: AAOx3, nonfocal  Psych: Normal affect and demeanor with intact judgement and insight   Data Reviewed: I have personally reviewed following labs and imaging studies  CBC:  Recent Labs Lab 11/01/15 1800  WBC 9.9  NEUTROABS 7.9*  HGB 16.0*  HCT 48.7*  MCV 87.6  PLT 902   Basic Metabolic Panel:  Recent Labs Lab 11/01/15 1800  NA 136  K 3.9  CL 105  CO2 21*  GLUCOSE 121*  BUN 10  CREATININE 1.01*  CALCIUM 9.1   GFR: Estimated Creatinine Clearance: 48.4 mL/min (by C-G formula based on SCr of 1.01 mg/dL). Liver Function Tests: No results for input(s): AST, ALT, ALKPHOS, BILITOT, PROT, ALBUMIN in the last 168 hours. No results for input(s): LIPASE, AMYLASE in the last 168 hours. No results for input(s): AMMONIA in the last 168 hours. Coagulation Profile:  Recent Labs Lab 11/02/15 0107  INR 1.12   Cardiac Enzymes:  Recent Labs Lab 11/01/15 1800 11/02/15 0107 11/02/15 0649  TROPONINI 0.05* 0.04* 0.04*   BNP (last 3 results) No results for input(s): PROBNP in the last 8760 hours. HbA1C: No results for input(s): HGBA1C in the last 72 hours. CBG: No results for input(s): GLUCAP in the last 168 hours. Lipid Profile:  Recent Labs  11/02/15 0649  CHOL 218*  HDL 40*  LDLCALC 164*  TRIG 68  CHOLHDL 5.5   Thyroid Function Tests: No results for input(s): TSH, T4TOTAL, FREET4, T3FREE, THYROIDAB in the last 72 hours. Anemia Panel: No results for input(s): VITAMINB12, FOLATE, FERRITIN, TIBC, IRON, RETICCTPCT in the last 72 hours. Urine analysis: No results found for: COLORURINE, APPEARANCEUR, LABSPEC, PHURINE, GLUCOSEU, HGBUR, BILIRUBINUR, KETONESUR, PROTEINUR, UROBILINOGEN, NITRITE, LEUKOCYTESUR Sepsis  Labs: _0 (procalcitonin:4,lacticidven:4)  )No results found for this or any previous visit (from the past 240 hour(s)).    Radiology Studies: Dg Chest 2 View  Result Date: 11/01/2015 CLINICAL DATA:  Shortness of breath.  History of sarcoidosis. EXAM: CHEST  2 VIEW COMPARISON:  Chest radiograph 11/01/2015 FINDINGS: Superior mediastinal and hilar prominence may indicate underlying adenopathy. There are extensive interstitial opacities, predominantly within the upper lungs. No focal consolidation. No pneumothorax or pleural effusion. IMPRESSION: Interstitial opacities and likely mediastinal adenopathy, likely secondary to known sarcoidosis. Electronically Signed   By: Ulyses Jarred M.D.   On: 11/01/2015 19:36   Dg Chest 2 View  Result Date: 11/01/2015 CLINICAL DATA:  Shortness of breath, cough, history of asthma and sarcoidosis EXAM: CHEST  2 VIEW COMPARISON:  None. FINDINGS: Increased interstitial markings, upper lobe and left lower lobe predominant, likely corresponding to known sarcoidosis. No definite  focal consolidation to suggest superimposed pneumonia. No pleural effusion or pneumothorax. Heart is normal in size. Visualized osseous structures are within normal limits. IMPRESSION: Suspected chronic interstitial markings related to known sarcoidosis. No findings suspicious for pneumonia. Electronically Signed   By: Julian Hy M.D.   On: 11/01/2015 16:31   Ct Angio Chest Pe W And/or Wo Contrast  Result Date: 11/01/2015 CLINICAL DATA:  Shortness of breath and tachycardia. History of sarcoidosis. EXAM: CT ANGIOGRAPHY CHEST WITH CONTRAST TECHNIQUE: Multidetector CT imaging of the chest was performed using the standard protocol during bolus administration of intravenous contrast. Multiplanar CT image reconstructions and MIPs were obtained to evaluate the vascular anatomy. CONTRAST:  100 cc Isovue 370 intravenous COMPARISON:  None. FINDINGS: Cardiovascular: No cardiomegaly. No pericardial  effusion. Enlarged main pulmonary artery measuring up to 31 mm, likely pulmonary hypertension. Study optimized for evaluation of the pulmonary arterial tree. No evidence of pulmonary embolism. Negative thoracic aorta. Mediastinum: Bulky mediastinal and hilar adenopathy. The subcarinal node is large enough to deform the left atrium. Findings attributed to patient's sarcoidosis history. Lungs/Pleura: Low volume lungs with apical predominant fibrotic interstitial opacities with central honeycombing. The lungs are diffusely ground-glass in appearance without Kerley lines are septal thickening. There are few patchy airspace type opacities in the right upper and right lower lobes. These could be fibrotic in nature but would correlate for any pneumonia symptoms. Upper abdomen: No acute findings. Hepatic artery branches are prominent and somewhat tortuous but there is no visible cirrhotic changes. Musculoskeletal: No chest wall mass or suspicious bone lesions identified. Review of the MIP images confirms the above findings. IMPRESSION: 1. Negative for pulmonary embolism. 2. Thoracic adenopathy and pulmonary fibrosis correlating with sarcoidosis history. Few patchy airspace opacities are likely related to the patient's chronic lung disease, but would correlate for pneumonia symptoms. 3. Probable pulmonary hypertension, presumably related to the fibrosis. Electronically Signed   By: Monte Fantasia M.D.   On: 11/01/2015 23:27     Scheduled Meds: . aspirin  325 mg Oral Daily  . azithromycin  500 mg Intravenous Q24H  . enoxaparin (LOVENOX) injection  40 mg Subcutaneous Q24H  . feeding supplement (ENSURE ENLIVE)  237 mL Oral TID BM  . ipratropium  0.5 mg Nebulization TID  . levalbuterol  1.25 mg Nebulization TID  . loratadine  10 mg Oral Daily  . methylPREDNISolone (SOLU-MEDROL) injection  60 mg Intravenous Q12H  . pantoprazole  40 mg Oral Q1200  . [START ON 11/03/2015] pneumococcal 23 valent vaccine  0.5 mL  Intramuscular Tomorrow-1000   Continuous Infusions: . sodium chloride 100 mL/hr (11/02/15 0202)     LOS: 0 days   Time Spent in minutes   30 minutes  Jennessy Sandridge D.O. on 11/02/2015 at 2:00 PM  Between 7am to 7pm - Pager - [phone]  After 7pm go to [email or web address] - password [email or web address]  And look for the night coverage person covering for me after hours  [facility] Office  [phone]

## 2015-11-02 NOTE — Progress Notes (Signed)
Echocardiogram 2D Echocardiogram has been performed.  Aggie Cosier 11/02/2015, 11:37 AM

## 2015-11-02 NOTE — ED Notes (Signed)
Attempted report x 2 

## 2015-11-02 NOTE — Progress Notes (Signed)
Initial Nutrition Assessment  DOCUMENTATION CODES:   Non-severe (moderate) malnutrition in context of chronic illness  INTERVENTION:  Provide Ensure Enlive po TID, each supplement provides 350 kcal and 20 grams of protein.  Encourage adequate PO intake.   NUTRITION DIAGNOSIS:   Malnutrition related to chronic illness as evidenced by percent weight loss, mild depletion of body fat, moderate depletions of muscle mass.  GOAL:   Patient will meet greater than or equal to 90% of their needs  MONITOR:   PO intake, Supplement acceptance, Labs, Weight trends, Skin, I & O's  REASON FOR ASSESSMENT:   Consult Assessment of nutrition requirement/status  ASSESSMENT:   50 y.o. female with medical history significant of GERD, migraine headache, sarcoidosis, who presents with shortness of breath and cough  Diet has been advanced. Pt reports having a lack of appetite which has been ongoing over the past 1 week. Pt reports trying to consume at least 3 meals a day (usually consisting of chicken and vegetables) however meal completion was 50%. Pt does reports consuming at least 3 Ensure Shakes a day. Usually body weight ~115-120 lbs, which she reported last weighing 8-9 months ago. Pt with a reported 13% weight loss in 8 months. RD to order Ensure to aid in caloric and protein needs. Pt encouraged to continue with her Ensure shakes at home to aid in caloric and protein needs. Pt concerned about her lack of appetite and plans on taking to the MD regarding an appetite stimulant medication.   Nutrition-Focused physical exam completed. Findings are mild to moderate fat depletion, moderate muscle depletion, and no edema.   Labs and medications reviewed.   Diet Order:  Diet regular Room service appropriate? Yes; Fluid consistency: Thin  Skin:  Reviewed, no issues  Last BM:  8/22  Height:   Ht Readings from Last 1 Encounters:  11/01/15 5' (1.524 m)    Weight:   Wt Readings from Last 1  Encounters:  11/02/15 100 lb 11.2 oz (45.7 kg)    Ideal Body Weight:  45.45 kg  BMI:  Body mass index is 19.67 kg/m.  Estimated Nutritional Needs:   Kcal:  1500-1700  Protein:  60-75 grams  Fluid:  >/= 1.5 L/day  EDUCATION NEEDS:   Education needs addressed  Corrin Parker, MS, RD, LDN Pager # [phone] After hours/ weekend pager # [phone]

## 2015-11-02 NOTE — ED Notes (Signed)
Attempted report x1.

## 2015-11-02 NOTE — Consult Note (Signed)
Name: Erin Zavala MRN: [id number] DOB: [birth year]    ADMISSION DATE:  11/01/2015 CONSULTATION DATE:  11/02/15  REFERRING MD :  Dr. Ree Kida   CHIEF COMPLAINT:  Shortness of Breath    HISTORY OF PRESENT ILLNESS:  50 y/o F, former occasional social smoker, with a PMH of GERD, migraines and occular sarcoidosis (dx in 1997) who presented to [facility] on 8/23 with reports of shortness of breath.    The patient reports she has noted a gradual worsening of shortness of breath with decreased activity tolerance. Initially it was only occassionally and with significant exertion (stairs).  As of late she has noted shortness of breath even at rest to the point that it is difficult to eat.  She reports baseline weight of ~ 115-120 lbs and she has noted since January of 2017 that she has lost weight (now down to 100 lbs).  After diagnosis of occular sarcoidosis, she was treated with occular steroid injections.  She was aware that she had mild pulmonary manifestations at that time.  She has noted a dry, mostly non-productive cough.  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    Initial ER evaluation notable for WBC 9.9, afebrile, tachycardia, and O2 saturations to 82% on RA. The patient was ruled out for PE with a negative CTA of the chest.  The patient was admitted per [facility] for further evaluation of hypoxic respiratory failure.  Follow up ECHO demonstrated LVEF of 55-60%, normal wall motion, elevated LV filling pressure, grade 1 diastolic dysfunction, mild TR and PA peak pressure of 60.    Currently, she denies fevers, chills, "sick feeling", chest pain, pain with inspiration, hemoptysis, n/v/d/.   PAST MEDICAL HISTORY :   has a past medical history of GERD  (gastroesophageal reflux disease); Migraines; and Sarcoidosis ([place]).  has a past surgical history that includes tuballigation.   Prior to Admission medications   Medication Sig Start Date End Date Taking? Authorizing Provider  naproxen sodium (ANAPROX) 220 MG tablet Take 220 mg by mouth as needed (pain).   Yes Historical Provider, MD  cyclobenzaprine (FLEXERIL) 10 MG tablet Take 1 tablet (10 mg total) by mouth 2 (two) times daily as needed for muscle spasms. Patient not taking: Reported on 11/02/2015 07/15/12   Jarrett Soho Muthersbaugh, PA-C  esomeprazole (NEXIUM) 20 MG capsule Take 20 mg by mouth daily at 12 noon.    Historical Provider, MD  HYDROcodone-acetaminophen (NORCO/VICODIN) 5-325 MG per tablet Take 2 tablets by mouth every 4 (four) hours as needed for pain. Patient not taking: Reported on 11/02/2015 07/15/12   Jarrett Soho Muthersbaugh, PA-C  naproxen (NAPROSYN) 500 MG tablet Take 1 tablet (500 mg total) by mouth 2 (two) times daily with a meal. Patient not taking: Reported on 11/02/2015 07/15/12   Jarrett Soho Muthersbaugh, PA-C   No Known Allergies  FAMILY HISTORY:  family history includes Hypertension in her father and mother.   SOCIAL HISTORY:  reports that she has quit smoking. She has never used smokeless tobacco. She reports that she does not drink alcohol or use drugs.  REVIEW OF SYSTEMS:  POSITIVES IN BOLD Constitutional: Negative for fever, chills, weight loss, malaise/fatigue and diaphoresis.  HENT: Negative for hearing loss, ear pain, nosebleeds, congestion, sore throat, neck pain, tinnitus and  ear discharge.   Eyes: Negative for blurred vision, double vision, photophobia, pain, discharge and redness.  Respiratory: Negative for cough, hemoptysis, sputum production, shortness of breath, wheezing and stridor.   Cardiovascular: Negative for chest pain, palpitations, orthopnea, claudication, leg swelling and PND.  Gastrointestinal: Negative for heartburn, nausea, vomiting, abdominal pain,  diarrhea, constipation, blood in stool and melena.  Genitourinary: Negative for dysuria, urgency, frequency, hematuria and flank pain.  Musculoskeletal: Negative for myalgias, back pain, joint pain and falls.  Skin: Negative for itching and rash.  Neurological: Negative for dizziness, tingling, tremors, sensory change, speech change, focal weakness, seizures, loss of consciousness, weakness and headaches.  Endo/Heme/Allergies: Negative for environmental allergies and polydipsia. Does not bruise/bleed easily.  SUBJECTIVE:   VITAL SIGNS: Temp:  [97.5 F (36.4 C)-97.7 F (36.5 C)] 97.7 F (36.5 C) (08/23 0820) Pulse Rate:  [91-128] 92 (08/23 0939) Resp:  [16-31] 18 (08/23 0939) BP: (102-123)/(77-97) 102/79 (08/23 0820) SpO2:  [82 %-100 %] 100 % (08/23 1406) FiO2 (%):  [3 %] 3 % (08/23 0426) Weight:  [100 lb 11.2 oz (45.7 kg)] 100 lb 11.2 oz (45.7 kg) (08/23 0136)  PHYSICAL EXAMINATION: General:  Thin adult female in NAD at rest  Neuro:  AAOx4, speech clear, MAE  HEENT:  MM pink/moist, no jvd Cardiovascular:  s1s2 rrr, no m/r/g, tachy  Lungs:  Mild tachypnea but no distress, lungs bilaterally diminished with few faint inspiratory crackles posterior lower  Abdomen:  Obese, soft, bsx4 active  Musculoskeletal:  No acute deformities  Skin:  Warm/dry, no edema Extremities:  Nail bed clubbing noted   Recent Labs Lab 11/01/15 1800  NA 136  K 3.9  CL 105  CO2 21*  BUN 10  CREATININE 1.01*  GLUCOSE 121*    Recent Labs Lab 11/01/15 1800  HGB 16.0*  HCT 48.7*  WBC 9.9  PLT 397   Dg Chest 2 View  Result Date: 11/01/2015 CLINICAL DATA:  Shortness of breath.  History of sarcoidosis. EXAM: CHEST  2 VIEW COMPARISON:  Chest radiograph 11/01/2015 FINDINGS: Superior mediastinal and hilar prominence may indicate underlying adenopathy. There are extensive interstitial opacities, predominantly within the upper lungs. No focal consolidation. No pneumothorax or pleural effusion.  IMPRESSION: Interstitial opacities and likely mediastinal adenopathy, likely secondary to known sarcoidosis. Electronically Signed   By: Ulyses Jarred M.D.   On: 11/01/2015 19:36   Dg Chest 2 View  Result Date: 11/01/2015 CLINICAL DATA:  Shortness of breath, cough, history of asthma and sarcoidosis EXAM: CHEST  2 VIEW COMPARISON:  None. FINDINGS: Increased interstitial markings, upper lobe and left lower lobe predominant, likely corresponding to known sarcoidosis. No definite focal consolidation to suggest superimposed pneumonia. No pleural effusion or pneumothorax. Heart is normal in size. Visualized osseous structures are within normal limits. IMPRESSION: Suspected chronic interstitial markings related to known sarcoidosis. No findings suspicious for pneumonia. Electronically Signed   By: Julian Hy M.D.   On: 11/01/2015 16:31   Ct Angio Chest Pe W And/or Wo Contrast  Result Date: 11/01/2015 CLINICAL DATA:  Shortness of breath and tachycardia. History of sarcoidosis. EXAM: CT ANGIOGRAPHY CHEST WITH CONTRAST TECHNIQUE: Multidetector CT imaging of the chest was performed using the standard protocol during bolus administration of intravenous contrast. Multiplanar CT image reconstructions and MIPs were obtained to evaluate the vascular anatomy. CONTRAST:  100 cc Isovue 370 intravenous COMPARISON:  None. FINDINGS: Cardiovascular: No cardiomegaly. No pericardial effusion. Enlarged main pulmonary artery measuring up to 31 mm, likely pulmonary hypertension. Study optimized for evaluation of  the pulmonary arterial tree. No evidence of pulmonary embolism. Negative thoracic aorta. Mediastinum: Bulky mediastinal and hilar adenopathy. The subcarinal node is large enough to deform the left atrium. Findings attributed to patient's sarcoidosis history. Lungs/Pleura: Low volume lungs with apical predominant fibrotic interstitial opacities with central honeycombing. The lungs are diffusely ground-glass in appearance  without Kerley lines are septal thickening. There are few patchy airspace type opacities in the right upper and right lower lobes. These could be fibrotic in nature but would correlate for any pneumonia symptoms. Upper abdomen: No acute findings. Hepatic artery branches are prominent and somewhat tortuous but there is no visible cirrhotic changes. Musculoskeletal: No chest wall mass or suspicious bone lesions identified. Review of the MIP images confirms the above findings. IMPRESSION: 1. Negative for pulmonary embolism. 2. Thoracic adenopathy and pulmonary fibrosis correlating with sarcoidosis history. Few patchy airspace opacities are likely related to the patient's chronic lung disease, but would correlate for pneumonia symptoms. 3. Probable pulmonary hypertension, presumably related to the fibrosis. Electronically Signed   By: Monte Fantasia M.D.   On: 11/01/2015 23:27   SIGNIFICANT EVENTS  8/22 - Admit with dyspnea   STUDIES:  CTA Chest 8/22: negative for PE, thoracic adenopathy & pulmonary fibrosis, few patchy areas of scarring, suspected pulmonary hypertension TTE 8/23: LVEF of 55-60%, normal wall motion, elevated LV filling pressure, grade 1 diastolic dysfunction, mild TR and PA peak pressure of 60.    ASSESSMENT / PLAN:  Acute Hypoxic Respiratory Failure - in the setting of suspected sarcoid pulmonary disease with evidence of pulmonary hypertension on ECHO and CT  Plan: Continue O2 for saturations > 92% with PH Assess ambulatory O2 needs prior to discharge Await respiratory viral panel Continue droplet precautions  Continue PPI  Await sputum culture  Bilateral Pulmonary Fibrosis - as noted on CTA chest 8/22 with areas of suspected scarring  Plan: Will need follow up in pulmonary office post discharge Assess ANA to rule out lupus  Continue empiric abx for now, doubt pulmonary infection  Assess PCT    Sarcoidosis - likely stage II based on radiographic findings    Plan: Continue solumedrol 60 mg IV Q12 Plan to taper to to 40 mg QD 8/24    Noe Gens, NP-C [facility] Pgr: [phone] or if no answer [phone] 11/02/2015, 4:45 PM  PCCM Attending Note: Please refer to patient's consultation note performed by nurse practitioner and reviewed extensively by me. 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 Patient was on prednisone therapy for approximately 2 years. Since then she has had ongoing intermittent cough productive of a clear mucus. She has noted increased dyspnea with exposure to heat/humidity as well as inhaled perfumes. She reports her dyspnea and cough did worsen prompting presentation to hospital. Patient denies any rashes or abnormal bruising. She denies any palpitations or near syncope. She denies any eye swelling, erythema, or blurry vision. Denies any recent travel or infectious contacts.  BP 102/79 (BP Location: Left Arm)   Pulse 92   Temp 97.7 F (36.5 C) (Axillary)   Resp 18   Wt 100 lb 11.2 oz (45.7 kg)   LMP 10/17/2015 Comment: perimenapausal  SpO2 100%   BMI 19.67 kg/m  Gen.: Sitting up in bed. Watching TV. Talking on the phone. Integument: Warm and dry. No rash on exposed skin. Pulmonary: Symmetrically decreased breath sounds. Normal work of breathing on nasal cannula oxygen. Speaking in complete sentences. Abdomen: Soft. Nontender. Nondistended. Cardiovascular: Regular rate &  rhythm. No appreciable JVD or edema.  TTE (11/02/15): LV normal in size with EF 55-60%. No wall motion abnormalities. Grade 1 diastolic dysfunction. LA & RA normal in size. RV normal in size and function. Pulmonary artery systolic pressure 60 mmHg. Diastolic flattening and systolic flattening of the intraventricular septum. No aortic stenosis or regurgitation. Aortic root normal in size. Trivial mitral regurgitation without stenosis. No pulmonic stenosis. Mild tricuspid regurgitation. Trivial pericardial  effusion.  CTA CHEST 11/01/15 (personally reviewed by me): No pulmonary emboli noted. No pleural effusion or thickening. Enlarged main pulmonary artery. No pericardial effusion appreciated. Bulky mediastinal and hilar lymphadenopathy. Bilateral upper lung cystic changes as well as subpleural consolidation within right lower lung and bilateral upper lobes. Bilateral patchy groundglass also noted.  A/P: 50 year old female with acute hypoxic respiratory failure remotely diagnosed with ocular sarcoidosis. Patient's CT imaging could be consistent with a combination of some mild fibrocystic (stage IV) sarcoidosis as well as stage II sarcoidosis. An additional autoimmune etiology is certainly possible given her history of dry mouth. Without a biopsy to confirm the diagnosis she is currently on empiric therapy. She has no infectious symptoms or history that would suggest as such. I do feel a limited autoimmune workup is reasonable at this time as we continue treatment of the patient. Certainly some childhood history of asthma could be contributing to some of her symptoms.  1. Possible sarcoidosis: Checking serum angiotensin-converting enzyme level. Also checking serum ANA. Continuing on prednisone with plan to taper to prednisone 40 mg by mouth daily tomorrow. Patient will need outpatient full pulmonary function testing as well as full autoimmune workup pending results of her serum ANA. Biopsy may be necessary but will be deferred for now. 2. Acute hypoxic respiratory failure: Possibly secondary to underlying sarcoidosis. Recommend continuing to wean FiO2 for saturation greater than 92% to prevent pulmonary arterial vasospasm. Patient will need a oxygen titration walk prior to discharge from hospital. 3. Possible pulmonary hypertension: No indication for pulmonary arterial vasodilator therapy at this time. We will plan for an outpatient workup with right and left heart catheterization. 4. Childhood asthma: Plan to  check full pulmonary function testing after discharge from hospital. We will plan to start the patient on inhaled medication therapy/nebulizer therapy as an outpatient. 5. Follow-up: The patient should be scheduled for a follow-up appointment in my clinic with me within 2-4 weeks of discharge from hospital.  [place]. Ashok Cordia, M.D. [facility] Pager:  [phone] After 3pm or if no response, call [phone] 4:48 PM 11/02/15

## 2015-11-03 DIAGNOSIS — J45909 Unspecified asthma, uncomplicated: Secondary | ICD-10-CM

## 2015-11-03 DIAGNOSIS — D860 Sarcoidosis of lung: Secondary | ICD-10-CM

## 2015-11-03 LAB — CBC
HEMATOCRIT: 43.2 % (ref 36.0–46.0)
HEMOGLOBIN: 14.3 g/dL (ref 12.0–15.0)
MCH: 29.1 pg (ref 26.0–34.0)
MCHC: 33.1 g/dL (ref 30.0–36.0)
MCV: 87.8 fL (ref 78.0–100.0)
Platelets: 352 10*3/uL (ref 150–400)
RBC: 4.92 MIL/uL (ref 3.87–5.11)
RDW: 14.7 % (ref 11.5–15.5)
WBC: 22.6 10*3/uL — AB (ref 4.0–10.5)

## 2015-11-03 LAB — BASIC METABOLIC PANEL
ANION GAP: 6 (ref 5–15)
BUN: 11 mg/dL (ref 6–20)
CHLORIDE: 105 mmol/L (ref 101–111)
CO2: 24 mmol/L (ref 22–32)
Calcium: 8.7 mg/dL — ABNORMAL LOW (ref 8.9–10.3)
Creatinine, Ser: 0.71 mg/dL (ref 0.44–1.00)
GFR calc non Af Amer: >60 mL/min (ref >60)
Glucose, Bld: 121 mg/dL — ABNORMAL HIGH (ref 65–99)
POTASSIUM: 4.8 mmol/L (ref 3.5–5.1)
Sodium: 135 mmol/L (ref 135–145)

## 2015-11-03 LAB — LEGIONELLA PNEUMOPHILA SEROGP 1 UR AG: L. PNEUMOPHILA SEROGP 1 UR AG: NEGATIVE

## 2015-11-03 LAB — RESPIRATORY PANEL BY PCR
ADENOVIRUS-RVPPCR: NOT DETECTED
Bordetella pertussis: NOT DETECTED
CHLAMYDOPHILA PNEUMONIAE-RVPPCR: NOT DETECTED
CORONAVIRUS 229E-RVPPCR: NOT DETECTED
Coronavirus HKU1: NOT DETECTED
Coronavirus NL63: NOT DETECTED
Coronavirus OC43: NOT DETECTED
Influenza A: NOT DETECTED
Influenza B: NOT DETECTED
METAPNEUMOVIRUS-RVPPCR: NOT DETECTED
Mycoplasma pneumoniae: NOT DETECTED
PARAINFLUENZA VIRUS 3-RVPPCR: NOT DETECTED
Parainfluenza Virus 1: NOT DETECTED
Parainfluenza Virus 2: NOT DETECTED
Parainfluenza Virus 4: NOT DETECTED
RHINOVIRUS / ENTEROVIRUS - RVPPCR: NOT DETECTED
Respiratory Syncytial Virus: NOT DETECTED

## 2015-11-03 LAB — ANTINUCLEAR ANTIBODIES, IFA: ANA Ab, IFA: POSITIVE — AB

## 2015-11-03 LAB — FANA STAINING PATTERNS: Speckled Pattern: 1:80 {titer}

## 2015-11-03 LAB — HEMOGLOBIN A1C
Hgb A1c MFr Bld: 6.2 % — ABNORMAL HIGH (ref 4.8–5.6)
Mean Plasma Glucose: 131 mg/dL

## 2015-11-03 LAB — ANGIOTENSIN CONVERTING ENZYME: ANGIOTENSIN-CONVERTING ENZYME: 120 U/L — AB (ref 14–82)

## 2015-11-03 MED ORDER — ONDANSETRON HCL 4 MG/2ML IJ SOLN
4.0000 mg | Freq: Four times a day (QID) | INTRAMUSCULAR | Status: DC | PRN
  Administered 2015-11-03: 4 mg via INTRAVENOUS
  Filled 2015-11-03: qty 2

## 2015-11-03 MED ORDER — PREDNISONE 20 MG PO TABS
40.0000 mg | ORAL_TABLET | Freq: Every day | ORAL | Status: DC
  Administered 2015-11-03 – 2015-11-04 (×2): 40 mg via ORAL
  Filled 2015-11-03 (×2): qty 2

## 2015-11-03 NOTE — Progress Notes (Signed)
paitent sitting with and w/o O2 was 100%. patietn ambulating with O2 and sated between 100-95% patietn when ambulated without OS and sats dropped to 81%.

## 2015-11-03 NOTE — Progress Notes (Signed)
Name: Erin Zavala MRN: [id number] DOB: [birth year]    ADMISSION DATE:  11/01/2015 CONSULTATION DATE:  11/02/15  REFERRING MD :  Dr. Ree Kida   CHIEF COMPLAINT:  Shortness of Breath    HISTORY OF PRESENT ILLNESS:   49 y/o F, former occasional social smoker, with a PMH of GERD, migraines and occular sarcoidosis (dx in 1997) who presented to [facility] on 8/23 with reports of shortness of breath.    SUBJECTIVE:   VITAL SIGNS: Temp:  [97.5 F (36.4 C)-98.5 F (36.9 C)] 97.5 F (36.4 C) (08/24 0917) Pulse Rate:  [83-92] 87 (08/24 0917) Resp:  [16-19] 16 (08/24 0917) BP: (127-144)/(89-97) 144/97 (08/24 0917) SpO2:  [98 %-100 %] 98 % (08/24 1008) Weight:  [100 lb 11.2 oz (45.7 kg)] 100 lb 11.2 oz (45.7 kg) (08/23 2200) 4 liters  PHYSICAL EXAMINATION: General:  Thin adult female in NAD at rest -->gets SOB w/ exertion but attributes it to fatigue Neuro:  AAOx4, speech clear, MAE  HEENT:  MM pink/moist, no jvd Cardiovascular:  s1s2 rrr, no m/r/g, tachy  Lungs:  M no distress, lungs bilaterally diminished no rales today  Abdomen:  Obese, soft, bsx4 active  Musculoskeletal:  No acute deformities  Skin:  Warm/dry, no edema Extremities:  Nail bed clubbing noted   Recent Labs Lab 11/01/15 1800 11/03/15 0733  NA 136 135  K 3.9 4.8  CL 105 105  CO2 21* 24  BUN 10 11  CREATININE 1.01* 0.71  GLUCOSE 121* 121*    Recent Labs Lab 11/01/15 1800 11/03/15 0733  HGB 16.0* 14.3  HCT 48.7* 43.2  WBC 9.9 22.6*  PLT 397 352   Dg Chest 2 View  Result Date: 11/01/2015 CLINICAL DATA:  Shortness of breath.  History of sarcoidosis. EXAM: CHEST  2 VIEW COMPARISON:  Chest radiograph 11/01/2015 FINDINGS: Superior mediastinal and hilar prominence may indicate underlying adenopathy. There are extensive interstitial opacities, predominantly within the upper lungs. No focal consolidation. No pneumothorax or pleural effusion. IMPRESSION: Interstitial opacities and likely mediastinal  adenopathy, likely secondary to known sarcoidosis. Electronically Signed   By: Ulyses Jarred M.D.   On: 11/01/2015 19:36   Dg Chest 2 View  Result Date: 11/01/2015 CLINICAL DATA:  Shortness of breath, cough, history of asthma and sarcoidosis EXAM: CHEST  2 VIEW COMPARISON:  None. FINDINGS: Increased interstitial markings, upper lobe and left lower lobe predominant, likely corresponding to known sarcoidosis. No definite focal consolidation to suggest superimposed pneumonia. No pleural effusion or pneumothorax. Heart is normal in size. Visualized osseous structures are within normal limits. IMPRESSION: Suspected chronic interstitial markings related to known sarcoidosis. No findings suspicious for pneumonia. Electronically Signed   By: Julian Hy M.D.   On: 11/01/2015 16:31   Ct Angio Chest Pe W And/or Wo Contrast  Result Date: 11/01/2015 CLINICAL DATA:  Shortness of breath and tachycardia. History of sarcoidosis. EXAM: CT ANGIOGRAPHY CHEST WITH CONTRAST TECHNIQUE: Multidetector CT imaging of the chest was performed using the standard protocol during bolus administration of intravenous contrast. Multiplanar CT image reconstructions and MIPs were obtained to evaluate the vascular anatomy. CONTRAST:  100 cc Isovue 370 intravenous COMPARISON:  None. FINDINGS: Cardiovascular: No cardiomegaly. No pericardial effusion. Enlarged main pulmonary artery measuring up to 31 mm, likely pulmonary hypertension. Study optimized for evaluation of the pulmonary arterial tree. No evidence of pulmonary embolism. Negative thoracic aorta. Mediastinum: Bulky mediastinal and hilar adenopathy. The subcarinal node is large enough to deform the left atrium. Findings attributed to patient's sarcoidosis  history. Lungs/Pleura: Low volume lungs with apical predominant fibrotic interstitial opacities with central honeycombing. The lungs are diffusely ground-glass in appearance without Kerley lines are septal thickening. There are few  patchy airspace type opacities in the right upper and right lower lobes. These could be fibrotic in nature but would correlate for any pneumonia symptoms. Upper abdomen: No acute findings. Hepatic artery branches are prominent and somewhat tortuous but there is no visible cirrhotic changes. Musculoskeletal: No chest wall mass or suspicious bone lesions identified. Review of the MIP images confirms the above findings. IMPRESSION: 1. Negative for pulmonary embolism. 2. Thoracic adenopathy and pulmonary fibrosis correlating with sarcoidosis history. Few patchy airspace opacities are likely related to the patient's chronic lung disease, but would correlate for pneumonia symptoms. 3. Probable pulmonary hypertension, presumably related to the fibrosis. Electronically Signed   By: Monte Fantasia M.D.   On: 11/01/2015 23:27   SIGNIFICANT EVENTS  8/22 - Admit with dyspnea   STUDIES:  CTA Chest 8/22: negative for PE, thoracic adenopathy & pulmonary fibrosis, few patchy areas of scarring, suspected pulmonary hypertension TTE 8/23: LVEF of 55-60%, normal wall motion, elevated LV filling pressure, grade 1 diastolic dysfunction, mild TR and PA peak pressure of 60.    ASSESSMENT / PLAN: 50 year old female with acute hypoxic respiratory failure remotely diagnosed with ocular sarcoidosis. Patient's CT imaging could be consistent with a combination of some mild fibrocystic (stage IV) sarcoidosis as well as stage II sarcoidosis. An additional autoimmune etiology is certainly possible given her history of dry mouth. Without a biopsy to confirm the diagnosis she is currently on empiric therapy. She has no infectious symptoms or history that would suggest as such.. Certainly some childhood history of asthma could be contributing to some of her symptoms. She seems better w/ steroids, BDs and supportive care.   1. Possible pulmonary sarcoidosis: Checking serum angiotensin-converting enzyme level. Also checking serum ANA (still  pending) Continuing on prednisone with plan to taper to prednisone 40 mg by mouth daily starting today (8/24). Patient will need outpatient full pulmonary function testing as well as full autoimmune workup pending results of her serum ANA. Biopsy may be necessary but will be deferred for now. Respiratory Viral studies are negative 2. Acute hypoxic respiratory failure: Possibly secondary to underlying sarcoidosis. Recommend continuing to wean FiO2 for saturation greater than 92% to prevent pulmonary arterial vasospasm. Patient will need a oxygen titration walk prior to discharge from hospital. 3. Possible pulmonary hypertension: No indication for pulmonary arterial vasodilator therapy at this time. We will plan for an outpatient workup with right and left heart catheterization. 4. Childhood asthma: Plan to check full pulmonary function testing after discharge from hospital. We will plan to start the patient on inhaled medication therapy/nebulizer therapy as an outpatient.   Follow-up: w/ Dr Ashok Cordia on: Sept 18th at 915 am   We will be available as needed   Erick Colace ACNP-BC [place] Pager # [phone] OR # [phone] if no answer  12:39 PM 11/03/15  PCCM Attending Note: Please refer to patient's progress note performed by nurse practitioner and reviewed extensively by me. Resulted in desaturation. Continuing to have intermittent coughing triggered by "tickle" in the back of her throat. Denies any chest pain or pressure.  Review of systems: No emesis but did have nausea. No subjective fever, chills, or sweats.  BP (!) 144/97 (BP Location: Right Arm)   Pulse 87   Temp 97.5 F (36.4 C) (Oral)   Resp 16  Ht 5' (1.524 m)   Wt 100 lb 11.2 oz (45.7 kg)   LMP 10/17/2015 Comment: perimenapausal  SpO2 99%   BMI 19.67 kg/m  Gen.: Sitting up in bed. Watching TV. No distress. Integument: Warm and dry. No rash on exposed skin. Pulmonary: Distant and symmetrically  decreased breath sounds. Normal work of breathing on nasal cannula oxygen.  Patient did desaturate with coughing spell.  Abdomen: Soft. Nontender. Nondistended. Cardiovascular: Regular rate & rhythm. No appreciable JVD or edema.  TTE (11/02/15): LV normal in size with EF 55-60%. No wall motion abnormalities. Grade 1 diastolic dysfunction. LA & RA normal in size. RV normal in size and function. Pulmonary artery systolic pressure 60 mmHg. Diastolic flattening and systolic flattening of the intraventricular septum. No aortic stenosis or regurgitation. Aortic root normal in size. Trivial mitral regurgitation without stenosis. No pulmonic stenosis. Mild tricuspid regurgitation. Trivial pericardial effusion.  CTA CHEST 11/01/15 (previously reviewed by me): No pulmonary emboli noted. No pleural effusion or thickening. Enlarged main pulmonary artery. No pericardial effusion appreciated. Bulky mediastinal and hilar lymphadenopathy. Bilateral upper lung cystic changes as well as subpleural consolidation within right lower lung and bilateral upper lobes. Bilateral patchy groundglass also noted.  A/P: 50 year old female with acute hypoxic respiratory failure remotely diagnosed with ocular sarcoidosis. Slow clinical improvement but she is improving.  1. Possible Sarcoidosis: Awaiting serum ACE & ANA results. Recommend continuing Prednisone 75m daily x7 days then taper by 173mevery 10 days until she sees me in office. Plan for further outpatient PFTs and workup pending the results of her serum testing. 2. Acute Hypoxic Respiratory Failure: Recommend ambulatory oxygen saturation prior to discharge to determine oxygen flow requirement to maintain saturation >92% to prevent pulmonary arterial vasospasm. 3. Possible Pulmonary Hypertension:  Continuing to hold on pulmonary arterial vasodilator therapy. Plan for left and right heart catheterization once patient recovers. 4. Childhood Asthma: Plan for Full PFTs after  she recovers. 5. Follow-up: With me on 9/18 @ 9:15am. She has my contact information & business card.  JeSonia BallereAshok CordiaM.D. [facility] Pager:  [phone] 3pm or if no response, call [phone] PM 11/03/15

## 2015-11-03 NOTE — Progress Notes (Addendum)
PROGRESS NOTE    Erin Zavala  FBX:[id number] DOB: [birth year] DOA: 11/01/2015 PCP: No PCP Per Patient   Chief Complaint  Patient presents with  . Shortness of Breath    Brief Narrative:  HPI On 11/01/2015 by Dr. Ivor Costa Erin Zavala is a 50 y.o. female with medical history significant of GERD, migraine headache, sarcoidosis, who presents with shortness of breath and cough Patient reports that she has been having cough and shortness of breath for about 1 week, which has been progressively getting worse. No chest pain. She coughs up little amount of clear mucus. No fever, chills. Patient has a acid reflux symptoms of heartburn. She denies nausea, vomiting, abdominal pain, diarrhea, symptoms of UTI or unilateral weakness. Patient states that she was diagnosed as sarcoidosis in 1997 when she was treated for eye problems. Her vision has improved. Currently not on any medications for sarcoidosis.  Assessment & Plan  Acute respiratory failure with hypoxia  -Unknown etiology  -Upon admission, SPO2 82% -CTA of chest is negative for PE, but showed pulmonary fibrosis correlating with history of sarcoidosis and patchy airspace opacities.  -Currently afebrile  -Initially placed on IV azithromycin (1 dose of ceftriaxone given) -Continue Mucinex, slight mitral, nebulizer treatments -Continue supplemental oxygen -Respiratory viral panel negative -Pulmonology consulted and appreciated, see discussion below. -Have asked to have oxygen saturations monitored on ambulation as well as rest. Patient dropped 81% while ambulating in her room. -Case management consulted for home oxygen as well as medications.  Sarcoidosis -Patient states that she had vision disturbance initially when she was diagnosed on 1997.  Her eye problem has resolved. She is not taking any medications for sarcoidosis. No follow-up in the past. 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  -Calcium 9.1 on  admission -Pulmonology consulted and appreciated, Have ordered ACE level, ANA. Recommended patient be on prednisone taper. Patient will need pulmonary function studies as an outpatient. She may also need a biopsy however that is deferred for now. Use of inhaled therapy as well as nebulizer therapy will be assessed as an outpatient as well. Patient may also need a right and left heart catheterization. Pulmonary arterial vasodilator therapy will be deferred at this time.  Elevated troponin -Troponin 0.05. However trending downward No chest pain, but has diffused T-wave inversion on EKG.  -Likely due to demand ischemia secondary to tachycardia and hypoxia -Continue aspirin -Echocardiogram: EF 91-91%, grade 1 diastolic dysfunction, PA peak pressure 60 mmHg  GERD -Protonix  Protein-calorie malnutrition, moderate  -Nutrition consulted  Leukocytosis -Likely secondary to steroids -Will continue to monitor CBC  DVT Prophylaxis  Lovenox  Code Status: Full   Family Communication: none at bedside  Disposition Plan: Admitted. Possible discharge 11/04/2015.  Consultants Pulmonology  Procedures  Echocardiogram  Antibiotics   Anti-infectives    Start     Dose/Rate Route Frequency Ordered Stop   11/02/15 2300  cefTRIAXone (ROCEPHIN) 1 g in dextrose 5 % 50 mL IVPB  Status:  Discontinued     1 g 100 mL/hr over 30 Minutes Intravenous Every 24 hours 11/02/15 0019 11/02/15 0234   11/02/15 2300  azithromycin (ZITHROMAX) 500 mg in dextrose 5 % 250 mL IVPB     500 mg 250 mL/hr over 60 Minutes Intravenous Every 24 hours 11/02/15 0019 11/08/15 2259   11/01/15 2345  cefTRIAXone (ROCEPHIN) 1 g in dextrose 5 % 50 mL IVPB     1 g 100 mL/hr over 30 Minutes Intravenous  Once 11/01/15 2343 11/02/15 0110  11/01/15 2345  azithromycin (ZITHROMAX) 500 mg in dextrose 5 % 250 mL IVPB     500 mg 250 mL/hr over 60 Minutes Intravenous  Once 11/01/15 2343 11/02/15 0142      Subjective:   Erin Zavala  seen and examined today. Patient States she feels improved today. Continues to have cough, nonproductive.  Denies any current chest pain, abdominal pain, nausea, vomiting, and diarrhea or constipation.   Objective:   Vitals:   11/02/15 2200 11/03/15 0551 11/03/15 0917 11/03/15 1008  BP:  135/90 (!) 144/97   Pulse:  83 87   Resp:  19 16   Temp:  97.6 F (36.4 C) 97.5 F (36.4 C)   TempSrc:  Oral Oral   SpO2:  100% 99% 98%  Weight: 45.7 kg (100 lb 11.2 oz)     Height: 5' (1.524 m)       Intake/Output Summary (Last 24 hours) at 11/03/15 1437 Last data filed at 11/03/15 0918  Gross per 24 hour  Intake          2453.33 ml  Output              900 ml  Net          1553.33 ml   Filed Weights   11/02/15 0136 11/02/15 2200  Weight: 45.7 kg (100 lb 11.2 oz) 45.7 kg (100 lb 11.2 oz)    Exam  General: Well developed, thin, NAD  HEENT: NCAT, , mucous membranes moist.   Cardiovascular: S1 S2 auscultated, no murmurs, RRR  Respiratory: Diminished but clear, occasional cough.  Abdomen: Soft, nontender, nondistended, + bowel sounds  Extremities: warm dry without cyanosis clubbing or edema. Patient does have clubbing noted on her fingernails  Neuro: AAOx3, nonfocal  Psych: appropriate mood and affect, pleasant   Data Reviewed: I have personally reviewed following labs and imaging studies  CBC:  Recent Labs Lab 11/01/15 1800 11/03/15 0733  WBC 9.9 22.6*  NEUTROABS 7.9*  --   HGB 16.0* 14.3  HCT 48.7* 43.2  MCV 87.6 87.8  PLT 397 250   Basic Metabolic Panel:  Recent Labs Lab 11/01/15 1800 11/03/15 0733  NA 136 135  K 3.9 4.8  CL 105 105  CO2 21* 24  GLUCOSE 121* 121*  BUN 10 11  CREATININE 1.01* 0.71  CALCIUM 9.1 8.7*   GFR: Estimated Creatinine Clearance: 61.1 mL/min (by C-G formula based on SCr of 0.8 mg/dL). Liver Function Tests: No results for input(s): AST, ALT, ALKPHOS, BILITOT, PROT, ALBUMIN in the last 168 hours. No results for input(s): LIPASE,  AMYLASE in the last 168 hours. No results for input(s): AMMONIA in the last 168 hours. Coagulation Profile:  Recent Labs Lab 11/02/15 0107  INR 1.12   Cardiac Enzymes:  Recent Labs Lab 11/01/15 1800 11/02/15 0107 11/02/15 0649 11/02/15 1253  TROPONINI 0.05* 0.04* 0.04* <0.03   BNP (last 3 results) No results for input(s): PROBNP in the last 8760 hours. HbA1C:  Recent Labs  11/02/15 0649  HGBA1C 6.2*   CBG: No results for input(s): GLUCAP in the last 168 hours. Lipid Profile:  Recent Labs  11/02/15 0649  CHOL 218*  HDL 40*  LDLCALC 164*  TRIG 68  CHOLHDL 5.5   Thyroid Function Tests: No results for input(s): TSH, T4TOTAL, FREET4, T3FREE, THYROIDAB in the last 72 hours. Anemia Panel: No results for input(s): VITAMINB12, FOLATE, FERRITIN, TIBC, IRON, RETICCTPCT in the last 72 hours. Urine analysis: No results found for: COLORURINE, APPEARANCEUR, [place], [place],  GLUCOSEU, HGBUR, BILIRUBINUR, KETONESUR, PROTEINUR, UROBILINOGEN, NITRITE, LEUKOCYTESUR Sepsis Labs: _0 (procalcitonin:4,lacticidven:4)  ) Recent Results (from the past 240 hour(s))  Respiratory Panel by PCR     Status: None   Collection Time: 11/02/15  5:39 PM  Result Value Ref Range Status   Adenovirus NOT DETECTED NOT DETECTED Final   Coronavirus 229E NOT DETECTED NOT DETECTED Final   Coronavirus HKU1 NOT DETECTED NOT DETECTED Final   Coronavirus NL63 NOT DETECTED NOT DETECTED Final   Coronavirus OC43 NOT DETECTED NOT DETECTED Final   Metapneumovirus NOT DETECTED NOT DETECTED Final   Rhinovirus / Enterovirus NOT DETECTED NOT DETECTED Final   Influenza A NOT DETECTED NOT DETECTED Final   Influenza B NOT DETECTED NOT DETECTED Final   Parainfluenza Virus 1 NOT DETECTED NOT DETECTED Final   Parainfluenza Virus 2 NOT DETECTED NOT DETECTED Final   Parainfluenza Virus 3 NOT DETECTED NOT DETECTED Final   Parainfluenza Virus 4 NOT DETECTED NOT DETECTED Final   Respiratory Syncytial Virus NOT  DETECTED NOT DETECTED Final   Bordetella pertussis NOT DETECTED NOT DETECTED Final   Chlamydophila pneumoniae NOT DETECTED NOT DETECTED Final   Mycoplasma pneumoniae NOT DETECTED NOT DETECTED Final      Radiology Studies: Dg Chest 2 View  Result Date: 11/01/2015 CLINICAL DATA:  Shortness of breath.  History of sarcoidosis. EXAM: CHEST  2 VIEW COMPARISON:  Chest radiograph 11/01/2015 FINDINGS: Superior mediastinal and hilar prominence may indicate underlying adenopathy. There are extensive interstitial opacities, predominantly within the upper lungs. No focal consolidation. No pneumothorax or pleural effusion. IMPRESSION: Interstitial opacities and likely mediastinal adenopathy, likely secondary to known sarcoidosis. Electronically Signed   By: Ulyses Jarred M.D.   On: 11/01/2015 19:36   Dg Chest 2 View  Result Date: 11/01/2015 CLINICAL DATA:  Shortness of breath, cough, history of asthma and sarcoidosis EXAM: CHEST  2 VIEW COMPARISON:  None. FINDINGS: Increased interstitial markings, upper lobe and left lower lobe predominant, likely corresponding to known sarcoidosis. No definite focal consolidation to suggest superimposed pneumonia. No pleural effusion or pneumothorax. Heart is normal in size. Visualized osseous structures are within normal limits. IMPRESSION: Suspected chronic interstitial markings related to known sarcoidosis. No findings suspicious for pneumonia. Electronically Signed   By: Julian Hy M.D.   On: 11/01/2015 16:31   Ct Angio Chest Pe W And/or Wo Contrast  Result Date: 11/01/2015 CLINICAL DATA:  Shortness of breath and tachycardia. History of sarcoidosis. EXAM: CT ANGIOGRAPHY CHEST WITH CONTRAST TECHNIQUE: Multidetector CT imaging of the chest was performed using the standard protocol during bolus administration of intravenous contrast. Multiplanar CT image reconstructions and MIPs were obtained to evaluate the vascular anatomy. CONTRAST:  100 cc Isovue 370 intravenous  COMPARISON:  None. FINDINGS: Cardiovascular: No cardiomegaly. No pericardial effusion. Enlarged main pulmonary artery measuring up to 31 mm, likely pulmonary hypertension. Study optimized for evaluation of the pulmonary arterial tree. No evidence of pulmonary embolism. Negative thoracic aorta. Mediastinum: Bulky mediastinal and hilar adenopathy. The subcarinal node is large enough to deform the left atrium. Findings attributed to patient's sarcoidosis history. Lungs/Pleura: Low volume lungs with apical predominant fibrotic interstitial opacities with central honeycombing. The lungs are diffusely ground-glass in appearance without Kerley lines are septal thickening. There are few patchy airspace type opacities in the right upper and right lower lobes. These could be fibrotic in nature but would correlate for any pneumonia symptoms. Upper abdomen: No acute findings. Hepatic artery branches are prominent and somewhat tortuous but there is no visible cirrhotic changes. Musculoskeletal: No  chest wall mass or suspicious bone lesions identified. Review of the MIP images confirms the above findings. IMPRESSION: 1. Negative for pulmonary embolism. 2. Thoracic adenopathy and pulmonary fibrosis correlating with sarcoidosis history. Few patchy airspace opacities are likely related to the patient's chronic lung disease, but would correlate for pneumonia symptoms. 3. Probable pulmonary hypertension, presumably related to the fibrosis. Electronically Signed   By: Monte Fantasia M.D.   On: 11/01/2015 23:27     Scheduled Meds: . antiseptic oral rinse  7 mL Mouth Rinse BID  . aspirin  325 mg Oral Daily  . azithromycin  500 mg Intravenous Q24H  . enoxaparin (LOVENOX) injection  40 mg Subcutaneous Q24H  . feeding supplement (ENSURE ENLIVE)  237 mL Oral TID BM  . ipratropium  0.5 mg Nebulization TID  . levalbuterol  1.25 mg Nebulization TID  . loratadine  10 mg Oral Daily  . pantoprazole  40 mg Oral Q1200  . predniSONE   40 mg Oral Q breakfast   Continuous Infusions: . sodium chloride 100 mL/hr at 11/03/15 0604     LOS: 1 day   Time Spent in minutes   30 minutes  Hendrick Pavich D.O. on 11/03/2015 at 2:37 PM  Between 7am to 7pm - Pager - [phone]  After 7pm go to [email or web address] - password [email or web address]  And look for the night coverage person covering for me after hours  [facility] Office  [phone]

## 2015-11-03 NOTE — Care Management Note (Addendum)
Case Management Note  Patient Details  Name: Erin Zavala MRN: [id number] Date of Birth: [birth year]  Subjective/Objective:     CM following for progression and d/c planning.                Action/Plan: 11/03/2015 Met with pt re d/c planning. Pt is currently uninsured, she has recently stated a new job however is not yet eligible for the insurance benefit. Have asked [facility]  to eval this pt for possible assistance with Oxygen. Will also provide a MATCH letter to assist pt with medications when ready for d/c. Pt has no PCP at this time. Have given pt info re [facility] to schedule an appointment. No appointments are available until Monday, November 07, 2015. If pt is d/c she will need to call at [facility] on Aug [facility], to schedule a hospital follow up appointment.   11/04/2015 [facility] able to assist with oxygen, pt will d/c to [facility], [place], [place] room 306 at the time of d/c. [place] notified.   Expected Discharge Date:  11/04/15               Expected Discharge Plan:  Home/Self Care  In-House Referral:  NA  Discharge planning Services  CM Consult, [place], [facility]  Post Acute Care Choice:  Durable Medical Equipment Choice offered to:  NA  DME Arranged:  Oxygen DME Agency:  [place]:    [facility] Agency:     Status of Service:  In process, will continue to follow  If discussed at Long Length of Stay Meetings, dates discussed:    Additional Comments:  Adron Bene, RN 11/03/2015, 3:19 PM

## 2015-11-03 NOTE — Progress Notes (Signed)
SATURATION QUALIFICATIONS: (This note is used to comply with regulatory documentation for home oxygen)  Patient Saturations on Room Air at Rest = 100%  Patient Saturations on Room Air while Ambulating = 81%  Patient Saturations on 3 Liters of oxygen while Ambulating = 100-95%  Please briefly explain why patient needs home oxygen:

## 2015-11-04 LAB — CBC
HCT: 42.8 % (ref 36.0–46.0)
HEMOGLOBIN: 14 g/dL (ref 12.0–15.0)
MCH: 29 pg (ref 26.0–34.0)
MCHC: 32.7 g/dL (ref 30.0–36.0)
MCV: 88.8 fL (ref 78.0–100.0)
PLATELETS: 337 10*3/uL (ref 150–400)
RBC: 4.82 MIL/uL (ref 3.87–5.11)
RDW: 14.7 % (ref 11.5–15.5)
WBC: 18.6 10*3/uL — AB (ref 4.0–10.5)

## 2015-11-04 LAB — BASIC METABOLIC PANEL
Anion gap: 8 (ref 5–15)
BUN: 10 mg/dL (ref 6–20)
CHLORIDE: 103 mmol/L (ref 101–111)
CO2: 26 mmol/L (ref 22–32)
CREATININE: 0.72 mg/dL (ref 0.44–1.00)
Calcium: 8.6 mg/dL — ABNORMAL LOW (ref 8.9–10.3)
Glucose, Bld: 115 mg/dL — ABNORMAL HIGH (ref 65–99)
POTASSIUM: 3.8 mmol/L (ref 3.5–5.1)
SODIUM: 137 mmol/L (ref 135–145)

## 2015-11-04 LAB — PROCALCITONIN

## 2015-11-04 MED ORDER — FLUTICASONE PROPIONATE 50 MCG/ACT NA SUSP
1.0000 | Freq: Two times a day (BID) | NASAL | Status: DC
  Administered 2015-11-04: 1 via NASAL
  Filled 2015-11-04 (×2): qty 16

## 2015-11-04 MED ORDER — PREDNISONE 10 MG PO TABS: ORAL_TABLET | ORAL | 0 refills | Status: DC

## 2015-11-04 MED ORDER — ASPIRIN 81 MG PO TABS: 81.0000 mg | ORAL_TABLET | Freq: Every day | ORAL | 0 refills | Status: AC

## 2015-11-04 MED ORDER — LORATADINE 10 MG PO TABS: 10.0000 mg | ORAL_TABLET | Freq: Every day | ORAL | 0 refills | Status: DC

## 2015-11-04 MED ORDER — AZITHROMYCIN 500 MG PO TABS: 500.0000 mg | ORAL_TABLET | Freq: Every day | ORAL | 0 refills | Status: DC

## 2015-11-04 MED ORDER — SALINE SPRAY 0.65 % NA SOLN: 1.0000 | NASAL | 0 refills | Status: DC | PRN

## 2015-11-04 MED ORDER — DEXTROSE 5 % IV SOLN: 500.0000 mg | INTRAVENOUS | 0 refills | Status: DC

## 2015-11-04 MED ORDER — DM-GUAIFENESIN ER 30-600 MG PO TB12: 1.0000 | ORAL_TABLET | Freq: Two times a day (BID) | ORAL | 0 refills | Status: DC | PRN

## 2015-11-04 MED ORDER — ENSURE ENLIVE PO LIQD: 237.0000 mL | Freq: Three times a day (TID) | ORAL | 0 refills | Status: DC

## 2015-11-04 NOTE — Discharge Instructions (Signed)
Respiratory failure is when your lungs are not working well and your breathing (respiratory) system fails. When respiratory failure occurs, it is difficult for your lungs to get enough oxygen, get rid of carbon dioxide, or both. Respiratory failure can be life threatening.  °Respiratory failure can be acute or chronic. Acute respiratory failure is sudden, severe, and requires emergency medical treatment. Chronic respiratory failure is less severe, happens over time, and requires ongoing treatment.  °WHAT ARE THE CAUSES OF ACUTE RESPIRATORY FAILURE?  °Any problem affecting the heart or lungs can cause acute respiratory failure. Some of these causes include the following: °· Chronic bronchitis and emphysema (COPD).   °· Blood clot going to a lung (pulmonary embolism).   °· Having water in the lungs caused by heart failure, lung injury, or infection (pulmonary edema).   °· Collapsed lung (pneumothorax).   °· Pneumonia.   °· Pulmonary fibrosis.   °· Obesity.   °· Asthma.   °· Heart failure.   °· Any type of trauma to the chest that can make breathing difficult.   °· Nerve or muscle diseases making chest movements difficult. °HOW WILL MY ACUTE RESPIRATORY FAILURE BE TREATED?  °Treatment of acute respiratory failure depends on the cause of the respiratory failure. Usually, you will stay in the intensive care unit so your breathing can be watched closely. Treatment can include the following: °· Oxygen. Oxygen can be delivered through the following: °¨ Nasal cannula. This is small tubing that goes in your nose to give you oxygen. °¨ Face mask. A face mask covers your nose and mouth to give you oxygen. °· Medicine. Different medicines can be given to help with breathing. These can include: °¨ Nebulizers. Nebulizers deliver medicines to open the air passages (bronchodilators). These medicines help to open or relax the airways in the lungs so you can breathe better. They can also help loosen mucus from your  lungs. °¨ Diuretics. Diuretic medicines can help you breathe better by getting rid of extra water in your body. °¨ Steroids. Steroid medicines can help decrease swelling (inflammation) in your lungs. °¨ Antibiotics. °· Chest tube. If you have a collapsed lung (pneumothorax), a chest tube is placed to help reinflate the lung. °· Noninvasive positive pressure ventilation (NPPV). This is a tight-fitting mask that goes over your nose and mouth. The mask has tubing that is attached to a machine. The machine blows air into the tubing, which helps to keep the tiny air sacs (alveoli) in your lungs open. This machine allows you to breathe on your own. °· Ventilator. A ventilator is a breathing machine. When on a ventilator, a breathing tube is put into the lungs. A ventilator is used when you can no longer breathe well enough on your own. You may have low oxygen levels or high carbon dioxide (CO2) levels in your blood. When you are on a ventilator, sedation and pain medicines are given to make you sleep so your lungs can heal. °SEEK IMMEDIATE MEDICAL CARE IF: °· You have shortness of breath (dyspnea) with or without activity. °· You have rapid breathing (tachypnea). °· You are wheezing. °· You are unable to say more than a few words without having to catch your breath. °· You find it very difficult to function normally. °· You have a fast heart rate. °· You have a bluish color to your finger or toe nail beds. °· You have confusion or drowsiness or both. °  °This information is not intended to replace advice given to you by your health care provider. Make sure you discuss   any questions you have with your health care provider.   Document Released: 03/03/2013 Document Revised: 11/17/2014 Document Reviewed: 03/03/2013 Elsevier Interactive Patient Education [employer].

## 2015-11-04 NOTE — Discharge Summary (Signed)
Physician Discharge Summary  Erin Zavala TXH:[id number] DOB: 12-20-1965 DOA: 11/01/2015  PCP: No PCP Per Patient  Admit date: 11/01/2015 Discharge date: 11/04/2015  Time spent: 45 minutes  Recommendations for Outpatient Follow-up:  Patient will be discharged to home with home oxygen.  Please call [place] [facility] for follow up appointment in one week and repeat CBC. Follow up with Dr. Ashok Zavala, pulmonology, on September 18 at 9:15am. Patient should continue medications as prescribed.  Patient should follow a regular diet.   Discharge Diagnoses:  Acute respiratory failure with hypoxia Sarcoidosis Elevated troponin GERD Protein calorie malnutrition, moderate Leukocytosis  Discharge Condition: Stable  Diet recommendation: Regular  Filed Weights   11/02/15 0136 11/02/15 2200  Weight: 45.7 kg (100 lb 11.2 oz) 45.7 kg (100 lb 11.2 oz)    History of present illness:  On 11/01/2015 by Dr. Mila Zavala a 50 y.o.femalewith medical history significant of GERD, migraine headache, sarcoidosis, who presents with shortness of breath and cough Patient reports that she has been having cough and shortness of breath for about 1 week, which has been progressively getting worse. No chest pain.She coughs up little amount of clear mucus. No fever, chills.Patient has a acid reflux symptoms of heartburn. She denies nausea, vomiting, abdominal pain, diarrhea, symptoms of UTI or unilateral weakness. Patient states that she was diagnosed as sarcoidosis in 1997 when she was treated for eye problems. Her vision has improved. Currently not on any medications for sarcoidosis.  Hospital Course:  Acute respiratory failure with hypoxia  -Unknown etiology  -Upon admission, SPO2 82% -CTA of chest isnegative for PE, but showed pulmonary fibrosiscorrelating with history of sarcoidosisand patchy airspace opacities.  -Currently afebrile  -Initially placed on IV azithromycin (1 dose of  ceftriaxone given) -Continue Mucinex, slight mitral, nebulizer treatments -Continue supplemental oxygen -Respiratory viral panel negative -Pulmonology consulted and appreciated, see discussion below. -Have asked to have oxygen saturations monitored on ambulation as well as rest. Patient dropped 81% while ambulating in her room. O2 sats with ambulation on 3L: 95-100% -Case management consulted for home oxygen as well as medications.  Sarcoidosis -Patient states that she had vision disturbance initially when she was diagnosed on 1997.  Her eye problem has resolved. She isnot taking any medications for sarcoidosis. No follow-up in the past. 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  -Calcium 9.1 on admission -Pulmonology consulted and appreciated, Have ordered ACE level, ANA. Recommended patient be on prednisone taper. Patient will need pulmonary function studies as an outpatient. She may also need a biopsy however that is deferred for now. Use of inhaled therapy as well as nebulizer therapy will be assessed as an outpatient as well. Patient may also need a right and left heart catheterization. Pulmonary arterial vasodilator therapy will be deferred at this time. Will discharge patient with long prednisone taper.   Elevated troponin -Troponin 0.05. However trending downward No chest pain, but has diffused T-wave inversion on EKG.  -Likely due to demand ischemia secondary to tachycardia and hypoxia -Continue aspirin -Echocardiogram: EF 20-23%, grade 1 diastolic dysfunction, PA peak pressure 60 mmHg  GERD -Protonix  Protein-calorie malnutrition, moderate  -Nutrition consulted  Leukocytosis -Likely secondary to steroids, WBC improving. Today 18 -Repeat CBC in one week  Consultants Pulmonology  Procedures  Echocardiogram  Discharge Exam: Vitals:   11/04/15 0427 11/04/15 0821  BP: 131/84 134/88  Pulse: 80 83  Resp: 18 17  Temp: 99.2 F (37.3 C) 97.9 F (36.6 C)     Exam  General: Well developed, thin, NAD  HEENT: NCAT, , mucous membranes moist.   Cardiovascular: S1 S2 auscultated, no murmurs, RRR  Respiratory: Diminished but clear, occasional cough.  Abdomen: Soft, nontender, nondistended, + bowel sounds  Extremities: warm dry without cyanosis clubbing or edema. +clubbing  Neuro: AAOx3, nonfocal  Psych: appropriate mood and affect, pleasant  Discharge Instructions Discharge Instructions    Discharge instructions    Complete by:  As directed   Patient will be discharged to home with home oxygen.  Please call [place] [facility] for follow up appointment in one week and repeat CBC. Follow up with Dr. Ashok Zavala, pulmonology, on September 18 at 9:15am. Patient should continue medications as prescribed.  Patient should follow a regular diet.     Current Discharge Medication List    START taking these medications   Details  aspirin 81 MG tablet Take 1 tablet (81 mg total) by mouth daily. Qty: 30 tablet, Refills: 0    azithromycin 500 mg in dextrose 5 % 250 mL Inject 500 mg into the vein daily. Qty: 5 tablet, Refills: 0    dextromethorphan-guaiFENesin (MUCINEX DM) 30-600 MG 12hr tablet Take 1 tablet by mouth 2 (two) times daily as needed for cough. Qty: 14 tablet, Refills: 0    feeding supplement, ENSURE ENLIVE, (ENSURE ENLIVE) LIQD Take 237 mLs by mouth 3 (three) times daily between meals. Qty: 90 Bottle, Refills: 0    loratadine (CLARITIN) 10 MG tablet Take 1 tablet (10 mg total) by mouth daily. Qty: 30 tablet, Refills: 0    predniSONE (DELTASONE) 10 MG tablet Take 4 tablets (30m) once per day for seven days.  Then take 3 tablets (339m once per day for seven days.  Then take 2 tablets (2056monce per day for seven days.  Then take 1 tablet (68m15mnce per day for seven days. Qty: 70 tablet, Refills: 0    sodium chloride (OCEAN) 0.65 % SOLN nasal spray Place 1 spray into both nostrils as needed for congestion. Qty: 480 mL,  Refills: 0      CONTINUE these medications which have NOT CHANGED   Details  naproxen sodium (ANAPROX) 220 MG tablet Take 220 mg by mouth as needed (pain).    esomeprazole (NEXIUM) 20 MG capsule Take 20 mg by mouth daily at 12 noon.      STOP taking these medications     cyclobenzaprine (FLEXERIL) 10 MG tablet      HYDROcodone-acetaminophen (NORCO/VICODIN) 5-325 MG per tablet      naproxen (NAPROSYN) 500 MG tablet        No Known Allergies Follow-up Information    [facility] Follow up on 11/28/2015.   Specialty:  Pulmonary Disease Why:  at 915 am  Contact information: [address] [phone]        [facility] Contact information: Please call [phone] on Monday, November 07, 2015 at 9am to schedule an appointment [facility].  [address] [id number]       The results of significant diagnostics from this hospitalization (including imaging, microbiology, ancillary and laboratory) are listed below for reference.    Significant Diagnostic Studies: Dg Chest 2 View  Result Date: 11/01/2015 CLINICAL DATA:  Shortness of breath.  History of sarcoidosis. EXAM: CHEST  2 VIEW COMPARISON:  Chest radiograph 11/01/2015 FINDINGS: Superior mediastinal and hilar prominence  may indicate underlying adenopathy. There are extensive interstitial opacities, predominantly within the upper lungs. No focal consolidation. No pneumothorax or pleural effusion. IMPRESSION: Interstitial opacities and likely mediastinal adenopathy, likely secondary to known sarcoidosis. Electronically Signed   By: Ulyses Jarred M.D.   On: 11/01/2015 19:36   Dg Chest 2 View  Result Date: 11/01/2015 CLINICAL DATA:  Shortness of breath, cough, history of asthma and sarcoidosis EXAM: CHEST  2 VIEW COMPARISON:  None. FINDINGS: Increased interstitial markings, upper lobe and left  lower lobe predominant, likely corresponding to known sarcoidosis. No definite focal consolidation to suggest superimposed pneumonia. No pleural effusion or pneumothorax. Heart is normal in size. Visualized osseous structures are within normal limits. IMPRESSION: Suspected chronic interstitial markings related to known sarcoidosis. No findings suspicious for pneumonia. Electronically Signed   By: Julian Hy M.D.   On: 11/01/2015 16:31   Ct Angio Chest Pe W And/or Wo Contrast  Result Date: 11/01/2015 CLINICAL DATA:  Shortness of breath and tachycardia. History of sarcoidosis. EXAM: CT ANGIOGRAPHY CHEST WITH CONTRAST TECHNIQUE: Multidetector CT imaging of the chest was performed using the standard protocol during bolus administration of intravenous contrast. Multiplanar CT image reconstructions and MIPs were obtained to evaluate the vascular anatomy. CONTRAST:  100 cc Isovue 370 intravenous COMPARISON:  None. FINDINGS: Cardiovascular: No cardiomegaly. No pericardial effusion. Enlarged main pulmonary artery measuring up to 31 mm, likely pulmonary hypertension. Study optimized for evaluation of the pulmonary arterial tree. No evidence of pulmonary embolism. Negative thoracic aorta. Mediastinum: Bulky mediastinal and hilar adenopathy. The subcarinal node is large enough to deform the left atrium. Findings attributed to patient's sarcoidosis history. Lungs/Pleura: Low volume lungs with apical predominant fibrotic interstitial opacities with central honeycombing. The lungs are diffusely ground-glass in appearance without Kerley lines are septal thickening. There are few patchy airspace type opacities in the right upper and right lower lobes. These could be fibrotic in nature but would correlate for any pneumonia symptoms. Upper abdomen: No acute findings. Hepatic artery branches are prominent and somewhat tortuous but there is no visible cirrhotic changes. Musculoskeletal: No chest wall mass or suspicious bone  lesions identified. Review of the MIP images confirms the above findings. IMPRESSION: 1. Negative for pulmonary embolism. 2. Thoracic adenopathy and pulmonary fibrosis correlating with sarcoidosis history. Few patchy airspace opacities are likely related to the patient's chronic lung disease, but would correlate for pneumonia symptoms. 3. Probable pulmonary hypertension, presumably related to the fibrosis. Electronically Signed   By: Monte Fantasia M.D.   On: 11/01/2015 23:27    Microbiology: Recent Results (from the past 240 hour(s))  Culture, blood (routine x 2) Call MD if unable to obtain prior to antibiotics being given     Status: None (Preliminary result)   Collection Time: 11/02/15 12:27 AM  Result Value Ref Range Status   Specimen Description BLOOD LEFT ARM  Final   Special Requests BOTTLES DRAWN AEROBIC AND ANAEROBIC 5ML  Final   Culture NO GROWTH 1 DAY  Final   Report Status PENDING  Incomplete  Culture, blood (routine x 2) Call MD if unable to obtain prior to antibiotics being given     Status: None (Preliminary result)   Collection Time: 11/02/15 12:32 AM  Result Value Ref Range Status   Specimen Description BLOOD LEFT HAND  Final   Special Requests BOTTLES DRAWN AEROBIC AND ANAEROBIC 5ML  Final   Culture NO GROWTH 1 DAY  Final   Report Status PENDING  Incomplete  Respiratory Panel by PCR  Status: None   Collection Time: 11/02/15  5:39 PM  Result Value Ref Range Status   Adenovirus NOT DETECTED NOT DETECTED Final   Coronavirus 229E NOT DETECTED NOT DETECTED Final   Coronavirus HKU1 NOT DETECTED NOT DETECTED Final   Coronavirus NL63 NOT DETECTED NOT DETECTED Final   Coronavirus OC43 NOT DETECTED NOT DETECTED Final   Metapneumovirus NOT DETECTED NOT DETECTED Final   Rhinovirus / Enterovirus NOT DETECTED NOT DETECTED Final   Influenza A NOT DETECTED NOT DETECTED Final   Influenza B NOT DETECTED NOT DETECTED Final   Parainfluenza Virus 1 NOT DETECTED NOT DETECTED Final    Parainfluenza Virus 2 NOT DETECTED NOT DETECTED Final   Parainfluenza Virus 3 NOT DETECTED NOT DETECTED Final   Parainfluenza Virus 4 NOT DETECTED NOT DETECTED Final   Respiratory Syncytial Virus NOT DETECTED NOT DETECTED Final   Bordetella pertussis NOT DETECTED NOT DETECTED Final   Chlamydophila pneumoniae NOT DETECTED NOT DETECTED Final   Mycoplasma pneumoniae NOT DETECTED NOT DETECTED Final     Labs: Basic Metabolic Panel:  Recent Labs Lab 11/01/15 1800 11/03/15 0733 11/04/15 0552  NA 136 135 137  K 3.9 4.8 3.8  CL 105 105 103  CO2 21* 24 26  GLUCOSE 121* 121* 115*  BUN _0 CREATININE 1.01* 0.71 0.72  CALCIUM 9.1 8.7* 8.6*   Liver Function Tests: No results for input(s): AST, ALT, ALKPHOS, BILITOT, PROT, ALBUMIN in the last 168 hours. No results for input(s): LIPASE, AMYLASE in the last 168 hours. No results for input(s): AMMONIA in the last 168 hours. CBC:  Recent Labs Lab 11/01/15 1800 11/03/15 0733 11/04/15 0552  WBC 9.9 22.6* 18.6*  NEUTROABS 7.9*  --   --   HGB 16.0* 14.3 14.0  HCT 48.7* 43.2 42.8  MCV 87.6 87.8 88.8  PLT 397 352 337   Cardiac Enzymes:  Recent Labs Lab 11/01/15 1800 11/02/15 0107 11/02/15 0649 11/02/15 1253  TROPONINI 0.05* 0.04* 0.04* <0.03   BNP: BNP (last 3 results) No results for input(s): BNP in the last 8760 hours.  ProBNP (last 3 results) No results for input(s): PROBNP in the last 8760 hours.  CBG: No results for input(s): GLUCAP in the last 168 hours.     SignedCristal Ford  [facility] 11/04/2015, 11:28 AM

## 2015-11-04 NOTE — Progress Notes (Signed)
Erin Zavala to be D/C'd Home per MD order.  Discussed prescriptions and follow up appointments with the patient. Prescriptions given to patient, medication list explained in detail. Pt verbalized understanding.    Medication List    STOP taking these medications   cyclobenzaprine 10 MG tablet Commonly known as:  FLEXERIL   HYDROcodone-acetaminophen 5-325 MG tablet Commonly known as:  NORCO/VICODIN   naproxen 500 MG tablet Commonly known as:  NAPROSYN     TAKE these medications   aspirin 81 MG tablet Take 1 tablet (81 mg total) by mouth daily.   azithromycin 500 MG tablet Commonly known as:  ZITHROMAX Take 1 tablet (500 mg total) by mouth daily.   dextromethorphan-guaiFENesin 30-600 MG 12hr tablet Commonly known as:  MUCINEX DM Take 1 tablet by mouth 2 (two) times daily as needed for cough.   esomeprazole 20 MG capsule Commonly known as:  NEXIUM Take 20 mg by mouth daily at 12 noon.   feeding supplement (ENSURE ENLIVE) Liqd Take 237 mLs by mouth 3 (three) times daily between meals.   loratadine 10 MG tablet Commonly known as:  CLARITIN Take 1 tablet (10 mg total) by mouth daily.   naproxen sodium 220 MG tablet Commonly known as:  ANAPROX Take 220 mg by mouth as needed (pain).   predniSONE 10 MG tablet Commonly known as:  DELTASONE Take 4 tablets (80m) once per day for seven days.  Then take 3 tablets (329m once per day for seven days.  Then take 2 tablets (2034monce per day for seven days.  Then take 1 tablet (30m57mnce per day for seven days.   sodium chloride 0.65 % Soln nasal spray Commonly known as:  OCEAN Place 1 spray into both nostrils as needed for congestion.       Vitals:   11/04/15 0427 11/04/15 0821  BP: 131/84 134/88  Pulse: 80 83  Resp: 18 17  Temp: 99.2 F (37.3 C) 97.9 F (36.6 C)    Skin clean, dry and intact without evidence of skin break down, no evidence of skin tears noted. IV catheter discontinued intact. Site without signs  and symptoms of complications. Dressing and pressure applied. Pt denies pain at this time. No complaints noted.  An After Visit Summary was printed and given to the patient. Match letter given to patient Patient escorted via WC, [place] D/C home via private auto.  LukeRetta Mac, RN

## 2015-11-04 NOTE — Discharge Summary (Signed)
Physician Discharge Summary  Erin Zavala TXH:[id number] DOB: 12-20-1965 DOA: 11/01/2015  PCP: No PCP Per Erin Zavala  Admit date: 11/01/2015 Discharge date: 11/04/2015  Time spent: 45 minutes  Recommendations for Outpatient Follow-up:  Erin Zavala will be discharged to home with home oxygen.  Please call [facility] for follow up appointment in one week and repeat CBC. Follow up with Dr. Ashok Cordia, pulmonology, on September 18 at 9:15am. Erin Zavala should continue medications as prescribed.  Erin Zavala should follow a regular diet.   Discharge Diagnoses:  Acute respiratory failure with hypoxia Sarcoidosis Elevated troponin GERD Protein calorie malnutrition, moderate Leukocytosis  Discharge Condition: Stable  Diet recommendation: Regular  Filed Weights   11/02/15 0136 11/02/15 2200  Weight: 45.7 kg (100 lb 11.2 oz) 45.7 kg (100 lb 11.2 oz)    History of present illness:  On 11/01/2015 by Dr. Mila Zavala a 50 y.o.femalewith medical history significant of GERD, migraine headache, sarcoidosis, who presents with shortness of breath and cough Erin Zavala reports that she has been having cough and shortness of breath for about 1 week, which has been progressively getting worse. No chest pain.She coughs up little amount of clear mucus. No fever, chills.Erin Zavala has a acid reflux symptoms of heartburn. She denies nausea, vomiting, abdominal pain, diarrhea, symptoms of UTI or unilateral weakness. Erin Zavala states that she was diagnosed as sarcoidosis in 1997 when she was treated for eye problems. Her vision has improved. Currently not on any medications for sarcoidosis.  Hospital Course:  Acute respiratory failure with hypoxia  -Unknown etiology  -Upon admission, SPO2 82% -CTA of chest isnegative for PE, but showed pulmonary fibrosiscorrelating with history of sarcoidosisand patchy airspace opacities.  -Currently afebrile  -Initially placed on IV azithromycin (1 dose of  ceftriaxone given) -Continue Mucinex, slight mitral, nebulizer treatments -Continue supplemental oxygen -Respiratory viral panel negative -Pulmonology consulted and appreciated, see discussion below. -Have asked to have oxygen saturations monitored on ambulation as well as rest. Erin Zavala dropped 81% while ambulating in her room. O2 sats with ambulation on 3L: 95-100% -Case management consulted for home oxygen as well as medications.  Sarcoidosis -Erin Zavala states that she had vision disturbance initially when she was diagnosed on 1997.  Her eye problem has resolved. She isnot taking any medications for sarcoidosis. No follow-up in the past. 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  -Calcium 9.1 on admission -Pulmonology consulted and appreciated, Have ordered ACE level, ANA. Recommended Erin Zavala be on prednisone taper. Erin Zavala will need pulmonary function studies as an outpatient. She may also need a biopsy however that is deferred for now. Use of inhaled therapy as well as nebulizer therapy will be assessed as an outpatient as well. Erin Zavala may also need a right and left heart catheterization. Pulmonary arterial vasodilator therapy will be deferred at this time. Will discharge Erin Zavala with long prednisone taper.   Elevated troponin -Troponin 0.05. However trending downward No chest pain, but has diffused T-wave inversion on EKG.  -Likely due to demand ischemia secondary to tachycardia and hypoxia -Continue aspirin -Echocardiogram: EF 20-23%, grade 1 diastolic dysfunction, PA peak pressure 60 mmHg  GERD -Protonix  Protein-calorie malnutrition, moderate  -Nutrition consulted  Leukocytosis -Likely secondary to steroids, WBC improving. Today 18 -Repeat CBC in one week  Consultants Pulmonology  Procedures  Echocardiogram  Discharge Exam: Vitals:   11/04/15 0427 11/04/15 0821  BP: 131/84 134/88  Pulse: 80 83  Resp: 18 17  Temp: 99.2 F (37.3 C) 97.9 F (36.6 C)     Exam  General: Well developed, thin, NAD  HEENT: NCAT, , mucous membranes moist.   Cardiovascular: S1 S2 auscultated, no murmurs, RRR  Respiratory: Diminished but clear, occasional cough.  Abdomen: Soft, nontender, nondistended, + bowel sounds  Extremities: warm dry without cyanosis clubbing or edema. +clubbing  Neuro: AAOx3, nonfocal  Psych: appropriate mood and affect, pleasant  Discharge Instructions Discharge Instructions    Discharge instructions    Complete by:  As directed   Erin Zavala will be discharged to home with home oxygen.  Please call [facility] for follow up appointment in one week and repeat CBC. Follow up with Dr. Ashok Cordia, pulmonology, on September 18 at 9:15am. Erin Zavala should continue medications as prescribed.  Erin Zavala should follow a regular diet.     Current Discharge Medication List    START taking these medications   Details  aspirin 81 MG tablet Take 1 tablet (81 mg total) by mouth daily. Qty: 30 tablet, Refills: 0    azithromycin (ZITHROMAX) 500 MG tablet Take 1 tablet (500 mg total) by mouth daily. Qty: 5 tablet, Refills: 0    dextromethorphan-guaiFENesin (MUCINEX DM) 30-600 MG 12hr tablet Take 1 tablet by mouth 2 (two) times daily as needed for cough. Qty: 14 tablet, Refills: 0    feeding supplement, ENSURE ENLIVE, (ENSURE ENLIVE) LIQD Take 237 mLs by mouth 3 (three) times daily between meals. Qty: 90 Bottle, Refills: 0    loratadine (CLARITIN) 10 MG tablet Take 1 tablet (10 mg total) by mouth daily. Qty: 30 tablet, Refills: 0    predniSONE (DELTASONE) 10 MG tablet Take 4 tablets (78m) once per day for seven days.  Then take 3 tablets (37m once per day for seven days.  Then take 2 tablets (2026monce per day for seven days.  Then take 1 tablet (58m29mnce per day for seven days. Qty: 70 tablet, Refills: 0    sodium chloride (OCEAN) 0.65 % SOLN nasal spray Place 1 spray into both nostrils as needed for congestion. Qty: 480  mL, Refills: 0      CONTINUE these medications which have NOT CHANGED   Details  naproxen sodium (ANAPROX) 220 MG tablet Take 220 mg by mouth as needed (pain).    esomeprazole (NEXIUM) 20 MG capsule Take 20 mg by mouth daily at 12 noon.      STOP taking these medications     cyclobenzaprine (FLEXERIL) 10 MG tablet      HYDROcodone-acetaminophen (NORCO/VICODIN) 5-325 MG per tablet      naproxen (NAPROSYN) 500 MG tablet        No Known Allergies Follow-up Information    [facility] Follow up on 11/28/2015.   Specialty:  Pulmonary Disease Why:  at 915 am  Contact information: [phone] [phone]        [facility] Contact information: Please call [phone] on Monday, November 07, 2015 at 9am to schedule an [facility].  [address] [phone]       The results of significant diagnostics from this hospitalization (including imaging, microbiology, ancillary and laboratory) are listed below for reference.    Significant Diagnostic Studies: Dg Chest 2 View  Result Date: 11/01/2015 CLINICAL DATA:  Shortness of breath.  History of sarcoidosis. EXAM: CHEST  2 VIEW COMPARISON:  Chest radiograph 11/01/2015 FINDINGS: Superior mediastinal and hilar prominence may indicate  underlying adenopathy. There are extensive interstitial opacities, predominantly within the upper lungs. No focal consolidation. No pneumothorax or pleural effusion. IMPRESSION: Interstitial opacities and likely mediastinal adenopathy, likely secondary to known sarcoidosis. Electronically Signed   By: Ulyses Jarred M.D.   On: 11/01/2015 19:36   Dg Chest 2 View  Result Date: 11/01/2015 CLINICAL DATA:  Shortness of breath, cough, history of asthma and sarcoidosis EXAM: CHEST  2 VIEW COMPARISON:  None. FINDINGS: Increased interstitial markings, upper lobe and left  lower lobe predominant, likely corresponding to known sarcoidosis. No definite focal consolidation to suggest superimposed pneumonia. No pleural effusion or pneumothorax. Heart is normal in size. Visualized osseous structures are within normal limits. IMPRESSION: Suspected chronic interstitial markings related to known sarcoidosis. No findings suspicious for pneumonia. Electronically Signed   By: Julian Hy M.D.   On: 11/01/2015 16:31   Ct Angio Chest Pe W And/or Wo Contrast  Result Date: 11/01/2015 CLINICAL DATA:  Shortness of breath and tachycardia. History of sarcoidosis. EXAM: CT ANGIOGRAPHY CHEST WITH CONTRAST TECHNIQUE: Multidetector CT imaging of the chest was performed using the standard protocol during bolus administration of intravenous contrast. Multiplanar CT image reconstructions and MIPs were obtained to evaluate the vascular anatomy. CONTRAST:  100 cc Isovue 370 intravenous COMPARISON:  None. FINDINGS: Cardiovascular: No cardiomegaly. No pericardial effusion. Enlarged main pulmonary artery measuring up to 31 mm, likely pulmonary hypertension. Study optimized for evaluation of the pulmonary arterial tree. No evidence of pulmonary embolism. Negative thoracic aorta. Mediastinum: Bulky mediastinal and hilar adenopathy. The subcarinal node is large enough to deform the left atrium. Findings attributed to Erin Zavala's sarcoidosis history. Lungs/Pleura: Low volume lungs with apical predominant fibrotic interstitial opacities with central honeycombing. The lungs are diffusely ground-glass in appearance without Kerley lines are septal thickening. There are few patchy airspace type opacities in the right upper and right lower lobes. These could be fibrotic in nature but would correlate for any pneumonia symptoms. Upper abdomen: No acute findings. Hepatic artery branches are prominent and somewhat tortuous but there is no visible cirrhotic changes. Musculoskeletal: No chest wall mass or suspicious bone  lesions identified. Review of the MIP images confirms the above findings. IMPRESSION: 1. Negative for pulmonary embolism. 2. Thoracic adenopathy and pulmonary fibrosis correlating with sarcoidosis history. Few patchy airspace opacities are likely related to the Erin Zavala's chronic lung disease, but would correlate for pneumonia symptoms. 3. Probable pulmonary hypertension, presumably related to the fibrosis. Electronically Signed   By: Monte Fantasia M.D.   On: 11/01/2015 23:27    Microbiology: Recent Results (from the past 240 hour(s))  Culture, blood (routine x 2) Call MD if unable to obtain prior to antibiotics being given     Status: None (Preliminary result)   Collection Time: 11/02/15 12:27 AM  Result Value Ref Range Status   Specimen Description BLOOD LEFT ARM  Final   Special Requests BOTTLES DRAWN AEROBIC AND ANAEROBIC 5ML  Final   Culture NO GROWTH 1 DAY  Final   Report Status PENDING  Incomplete  Culture, blood (routine x 2) Call MD if unable to obtain prior to antibiotics being given     Status: None (Preliminary result)   Collection Time: 11/02/15 12:32 AM  Result Value Ref Range Status   Specimen Description BLOOD LEFT HAND  Final   Special Requests BOTTLES DRAWN AEROBIC AND ANAEROBIC 5ML  Final   Culture NO GROWTH 1 DAY  Final   Report Status PENDING  Incomplete  Respiratory Panel by PCR  Status: None   Collection Time: 11/02/15  5:39 PM  Result Value Ref Range Status   Adenovirus NOT DETECTED NOT DETECTED Final   Coronavirus 229E NOT DETECTED NOT DETECTED Final   Coronavirus HKU1 NOT DETECTED NOT DETECTED Final   Coronavirus NL63 NOT DETECTED NOT DETECTED Final   Coronavirus OC43 NOT DETECTED NOT DETECTED Final   Metapneumovirus NOT DETECTED NOT DETECTED Final   Rhinovirus / Enterovirus NOT DETECTED NOT DETECTED Final   Influenza A NOT DETECTED NOT DETECTED Final   Influenza B NOT DETECTED NOT DETECTED Final   Parainfluenza Virus 1 NOT DETECTED NOT DETECTED Final    Parainfluenza Virus 2 NOT DETECTED NOT DETECTED Final   Parainfluenza Virus 3 NOT DETECTED NOT DETECTED Final   Parainfluenza Virus 4 NOT DETECTED NOT DETECTED Final   Respiratory Syncytial Virus NOT DETECTED NOT DETECTED Final   Bordetella pertussis NOT DETECTED NOT DETECTED Final   Chlamydophila pneumoniae NOT DETECTED NOT DETECTED Final   Mycoplasma pneumoniae NOT DETECTED NOT DETECTED Final     Labs: Basic Metabolic Panel:  Recent Labs Lab 11/01/15 1800 11/03/15 0733 11/04/15 0552  NA 136 135 137  K 3.9 4.8 3.8  CL 105 105 103  CO2 21* 24 26  GLUCOSE 121* 121* 115*  BUN _0 CREATININE 1.01* 0.71 0.72  CALCIUM 9.1 8.7* 8.6*   Liver Function Tests: No results for input(s): AST, ALT, ALKPHOS, BILITOT, PROT, ALBUMIN in the last 168 hours. No results for input(s): LIPASE, AMYLASE in the last 168 hours. No results for input(s): AMMONIA in the last 168 hours. CBC:  Recent Labs Lab 11/01/15 1800 11/03/15 0733 11/04/15 0552  WBC 9.9 22.6* 18.6*  NEUTROABS 7.9*  --   --   HGB 16.0* 14.3 14.0  HCT 48.7* 43.2 42.8  MCV 87.6 87.8 88.8  PLT 397 352 337   Cardiac Enzymes:  Recent Labs Lab 11/01/15 1800 11/02/15 0107 11/02/15 0649 11/02/15 1253  TROPONINI 0.05* 0.04* 0.04* <0.03   BNP: BNP (last 3 results) No results for input(s): BNP in the last 8760 hours.  ProBNP (last 3 results) No results for input(s): PROBNP in the last 8760 hours.  CBG: No results for input(s): GLUCAP in the last 168 hours.     SignedCristal Ford  [facility] 11/04/2015, 2:50 PM

## 2015-11-04 NOTE — Progress Notes (Signed)
SATURATION QUALIFICATIONS: (This note is used to comply with regulatory documentation for home oxygen)  Patient Saturations on Room Air at Rest = 100%  Patient Saturations on Room Air while Ambulating = 81%  Patient Saturations on 3 Liters of oxygen while Ambulating = 95-100%  Please briefly explain why patient needs home oxygen:

## 2015-11-04 NOTE — Care Management (Addendum)
Spoke with [facility] on 11/03/2015 re d/c of this pt with home Oxygen. Await [facility] eval for assistance with home Oxygen. Follow up call to [facility] today, 11/04/2015 await response.   10:15 Notified by Brenton Grills of [facility] , that pt is eligible for home Oxygen. This CM notified Dr Ree Kida. Pt will be staying at [facility] until she is more recovered as she is concerned about old carpets in her home. Will notify [facility].  Address:   [facility], [address] [place], [place] room [address].

## 2015-11-09 ENCOUNTER — Encounter: Payer: Self-pay | Admitting: *Deleted

## 2015-11-09 ENCOUNTER — Encounter: Payer: Self-pay | Admitting: Critical Care Medicine

## 2015-11-09 ENCOUNTER — Ambulatory Visit: Payer: Medicaid Other | Attending: Critical Care Medicine | Admitting: Critical Care Medicine

## 2015-11-09 VITALS — BP 121/83 | HR 90 | Temp 97.5°F | Resp 18 | Ht 60.0 in | Wt 108.4 lb

## 2015-11-09 DIAGNOSIS — D869 Sarcoidosis, unspecified: Secondary | ICD-10-CM

## 2015-11-09 DIAGNOSIS — K219 Gastro-esophageal reflux disease without esophagitis: Secondary | ICD-10-CM | POA: Diagnosis not present

## 2015-11-09 DIAGNOSIS — J9611 Chronic respiratory failure with hypoxia: Secondary | ICD-10-CM | POA: Diagnosis not present

## 2015-11-09 DIAGNOSIS — Z7982 Long term (current) use of aspirin: Secondary | ICD-10-CM | POA: Insufficient documentation

## 2015-11-09 DIAGNOSIS — Z79899 Other long term (current) drug therapy: Secondary | ICD-10-CM | POA: Diagnosis not present

## 2015-11-09 DIAGNOSIS — D860 Sarcoidosis of lung: Secondary | ICD-10-CM | POA: Insufficient documentation

## 2015-11-09 DIAGNOSIS — Z87891 Personal history of nicotine dependence: Secondary | ICD-10-CM | POA: Diagnosis not present

## 2015-11-09 LAB — CULTURE, BLOOD (ROUTINE X 2)
CULTURE: NO GROWTH
Culture: NO GROWTH

## 2015-11-09 MED ORDER — ESOMEPRAZOLE MAGNESIUM 20 MG PO CPDR: 20.0000 mg | DELAYED_RELEASE_CAPSULE | Freq: Every day | ORAL | 6 refills | Status: DC

## 2015-11-09 MED FILL — ESOMEPRAZOLE MAG DR 20 MG C: 20 | 30 days supply | Qty: 30 | Fill #0

## 2015-11-09 NOTE — Assessment & Plan Note (Addendum)
pulm sarcoidosis Flare  Plan Pulse prednisone Oxygen therapy Needs [place] pulm f/u , I will refere ?pulm htn rx

## 2015-11-09 NOTE — Progress Notes (Signed)
Patient is here for HFU  Patient denies pain at this time  Patient has taken medication today and patient has eaten.

## 2015-11-09 NOTE — Patient Instructions (Addendum)
Continue prednisone as prescribed Continue oxygen as prescribed nexium refilled  No further changes Return 2 months

## 2015-11-09 NOTE — Progress Notes (Signed)
Subjective:    Patient ID: Erin Zavala, female    DOB: 1965/10/20, 49 y.o.   MRN: [id number]  Hosp f/u sarcoid and resp failure Dx in 1997 in eyes.  Pt had some scarring in the lungs.  Rx eyes only for a few years, in remission. Pt noted more symptoms in the past year.  Pt notes more allergies, work environment issues and worse last 6 months. Also GERD.  D/c on 2L oxygen.  Uses [facility].     Shortness of Breath  This is a chronic problem. The current episode started more than 1 year ago. The problem occurs daily (exertional only but occ at rest ). Associated symptoms include chest pain and wheezing. Pertinent negatives include no sore throat.   Hosp f/u . On 11/01/2015 by Dr. Mila Palmer a 50 y.o.femalewith medical history significant of GERD, migraine headache, sarcoidosis, who presents with shortness of breath and cough Patient reports that she has been having cough and shortness of breath for about 1 week, which has been progressively getting worse. No chest pain.She coughs up little amount of clear mucus. No fever, chills.Patient has a acid reflux symptoms of heartburn. She denies nausea, vomiting, abdominal pain, diarrhea, symptoms of UTI or unilateral weakness. Patient states that she was diagnosed as sarcoidosis in 1997 when she was treated for eye problems. Her vision has improved. Currently not on any medications for sarcoidosis.  Hospital Course:  Acute respiratory failure with hypoxia  -Unknown etiology  -Upon admission, SPO2 82% -CTA of chest isnegative for PE, but showed pulmonary fibrosiscorrelating with history of sarcoidosisand patchy airspace opacities.  -Currently afebrile  -Initiallyplaced on IV azithromycin (1 dose of ceftriaxone given) -Continue Mucinex, slight mitral, nebulizer treatments -Continue supplemental oxygen -Respiratory viral panel negative -Pulmonology consulted and appreciated, see discussion below. -Have asked to have oxygen  saturations monitored on ambulation as well as rest. Patient dropped 81% while ambulating in her room. O2 sats with ambulation on 3L: 95-100% -Case management consulted for home oxygen as well as medications.  Sarcoidosis -Patient states that she had vision disturbance initially when she was diagnosed on 1997. Her eye problem has resolved. She isnot taking any medications for sarcoidosis. No follow-up in the past. 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  -Calcium 9.1 on admission -Pulmonology consulted and appreciated, Have ordered ACElevel, ANA. Recommended patient be on prednisone taper. Patient will need pulmonary function studies as an outpatient. She may also need a biopsy however that is deferred for now. Use of inhaled therapy as well as nebulizer therapy will be assessed as an outpatient as well. Patient may also need a right and left heart catheterization. Pulmonary arterial vasodilator therapy will be deferred at this time. Will discharge patient with long prednisone taper.   Elevated troponin -Troponin 0.05. However trending downward No chest pain, but has diffused T-wave inversion on EKG.  -Likely due to demand ischemia secondary to tachycardia and hypoxia -Continue aspirin -Echocardiogram: EF 44-03%, grade 1 diastolic dysfunction, PA peak pressure 60 mmHg  GERD -Protonix  Protein-calorie malnutrition, moderate  -Nutrition consulted  Leukocytosis -Likely secondary to steroids, WBC improving. Today 18 -Repeat CBC in one week  Past Medical History:  Diagnosis Date  . GERD (gastroesophageal reflux disease)   . Migraines   . Sarcoidosis ([place])      Family History  Problem Relation Age of Onset  . Hypertension Mother   . Hypertension Father   . Rheumatologic disease Neg Hx      Social  History   Social History  . Marital status: Married    Spouse name: N/A  . Number of children: N/A  . Years of education: N/A   Occupational History  . Not on  file.   Social History Main Topics  . Smoking status: Former Research scientist (life sciences)  . Smokeless tobacco: Never Used     Comment: former occasional social smoker  . Alcohol use No  . Drug use: No  . Sexual activity: Not on file   Other Topics Concern  . Not on file   Social History Narrative   Patient denies any bird or mold exposure. Currently works with exposure to inhaled perfumes and chemicals. 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 Previous exposure to inhaled dust while working in [employer]. Denies any IV drug use or previous incarceration. No known tuberculosis exposure. She has never lived nor volunteered at a homeless shelter.     No Known Allergies   Outpatient Medications Prior to Visit  Medication Sig Dispense Refill  . aspirin 81 MG tablet Take 1 tablet (81 mg total) by mouth daily. 30 tablet 0  . dextromethorphan-guaiFENesin (MUCINEX DM) 30-600 MG 12hr tablet Take 1 tablet by mouth 2 (two) times daily as needed for cough. 14 tablet 0  . feeding supplement, ENSURE ENLIVE, (ENSURE ENLIVE) LIQD Take 237 mLs by mouth 3 (three) times daily between meals. 90 Bottle 0  . loratadine (CLARITIN) 10 MG tablet Take 1 tablet (10 mg total) by mouth daily. 30 tablet 0  . naproxen sodium (ANAPROX) 220 MG tablet Take 220 mg by mouth as needed (pain).    . predniSONE (DELTASONE) 10 MG tablet Take 4 tablets (23m) once per day for seven days.  Then take 3 tablets (370m once per day for seven days.  Then take 2 tablets (2052monce per day for seven days.  Then take 1 tablet (32m47mnce per day for seven days. 70 tablet 0  . sodium chloride (OCEAN) 0.65 % SOLN nasal spray Place 1 spray into both nostrils as needed for congestion. 480 mL 0  . azithromycin (ZITHROMAX) 500 MG tablet Take 1 tablet (500 mg total) by mouth daily. 5 tablet 0  . esomeprazole (NEXIUM) 20 MG capsule Take 20 mg by mouth daily at 12 noon.     No  facility-administered medications prior to visit.      Review of Systems  Constitutional: Negative for fatigue.  HENT: Positive for nosebleeds, trouble swallowing and voice change. Negative for sore throat.   Respiratory: Positive for cough, chest tightness, shortness of breath and wheezing.   Cardiovascular: Positive for chest pain.  Gastrointestinal:       Notes some gerd  Hematological: Negative for adenopathy.       Objective:   Physical Exam Vitals:   11/09/15 1226  BP: 121/83  Pulse: 90  Resp: 18  Temp: 97.5 F (36.4 C)  TempSrc: Oral  SpO2: 98%  Weight: 108 lb 6.4 oz (49.2 kg)  Height: 5' (1.524 m)    Gen: Pleasant, well-nourished, in no distress,  normal affect  ENT: No lesions,  mouth clear,  oropharynx clear, no postnasal drip  Neck: No JVD, no TMG, no carotid bruits  Lungs: No use of accessory muscles, no dullness to percussion,basilar dry rales  Cardiovascular: RRR, heart sounds normal, no murmur or gallops, no peripheral edema  Abdomen: soft and NT, no HSM,  BS normal  Musculoskeletal: No deformities, no cyanosis or clubbing  Neuro: alert, non focal  Skin: Warm, no lesions or rashes  No results found.'      Assessment & Plan:  I personally reviewed all images and lab data in the [facility] system as well as any outside material available during this office visit and agree with the  radiology impressions.   Pulmonary sarcoidosis ([place]) pulm sarcoidosis Flare  Plan Pulse prednisone Oxygen therapy Needs [place] pulm f/u , I will refere ?pulm htn rx  GERD (gastroesophageal reflux disease) Cont GERD Rx   Erin Zavala was seen today for hospitalization follow-up.  Diagnoses and all orders for this visit:  Sarcoidosis ([place])  Chronic hypoxemic respiratory failure ([place])  Pulmonary sarcoidosis ([place])  Gastroesophageal reflux disease without esophagitis  Other orders -     esomeprazole (NEXIUM) 20 MG capsule; Take 1 capsule (20 mg total) by mouth  daily at 12 noon.

## 2015-11-09 NOTE — Assessment & Plan Note (Signed)
Cont GERD Rx

## 2015-11-28 ENCOUNTER — Telehealth: Payer: Self-pay | Admitting: Pulmonary Disease

## 2015-11-28 ENCOUNTER — Ambulatory Visit (INDEPENDENT_AMBULATORY_CARE_PROVIDER_SITE_OTHER): Payer: Self-pay | Admitting: Pulmonary Disease

## 2015-11-28 ENCOUNTER — Encounter: Payer: Self-pay | Admitting: Pulmonary Disease

## 2015-11-28 ENCOUNTER — Telehealth (HOSPITAL_COMMUNITY): Payer: Self-pay | Admitting: Vascular Surgery

## 2015-11-28 VITALS — BP 108/72 | HR 108 | Ht 60.0 in | Wt 109.6 lb

## 2015-11-28 DIAGNOSIS — J45909 Unspecified asthma, uncomplicated: Secondary | ICD-10-CM

## 2015-11-28 DIAGNOSIS — R002 Palpitations: Secondary | ICD-10-CM

## 2015-11-28 DIAGNOSIS — K219 Gastro-esophageal reflux disease without esophagitis: Secondary | ICD-10-CM

## 2015-11-28 DIAGNOSIS — D869 Sarcoidosis, unspecified: Secondary | ICD-10-CM

## 2015-11-28 DIAGNOSIS — J9601 Acute respiratory failure with hypoxia: Secondary | ICD-10-CM

## 2015-11-28 NOTE — Patient Instructions (Addendum)
   Remember to schedule an appointment with an Ophthalmologist to make sure you don't have any Sarcoidosis in your eyes.  I am referring you to a Cardiologist to do a heart catheterization.  We will do blood work and breathing tests when you come back to see me.   Please use your oxygen at 3 L/m when you are walking or exerting yourself.   Call me if you feel your breathing or coughing is getting worse after you stop the Prednisone.  TESTS ORDERED: 1. 48 Hour Holter Monitor 2. Full PFTs at Follow-up 3. 6MWT with oxygen titration at next appointment

## 2015-11-28 NOTE — Addendum Note (Signed)
Addended by: Inge Rise on: 11/28/2015 10:27 AM   Modules accepted: Orders

## 2015-11-28 NOTE — Progress Notes (Signed)
Subjective:    Patient ID: Erin Zavala, female    DOB: 13-Dec-1965, 50 y.o.   MRN: [id number]  C.C.:  Follow-up for Possible Sarcoidosis, Acute Hypoxic Respiratory Failure, Childhood Asthma, Possible Pulmonary Hypertension, & GERD.  HPI Possible Sarcoidosis:  Started on Prednisone 61m daily during last hospitalization. Previously diagnosed in 1997. She reports her dyspnea has improved significantly. She reports her cough has significantly improved and is only intermittent. No syncope or near syncope.  No eye swelling, pain, or erythema. She has tapered to Prednisone 134mdaily from 4080mreviously. Her taper finishes on Friday.   Acute Hypoxic Respiratory Failure:  Required 3 L/m on exertion at last appointment to maintain her saturation. However she has been using 2 L/m since her discharge per her report. Brief walk test today continues to show a 3 L/m requirement with exertion.  Childhood Asthma:  Per patient's report. Not currently on any inhaled medications. She reports she has no nocturnal coughing or awakening with any breathing problems.   Possible Pulmonary Hypertension:  She reports she has had intermittent palpitations. Denies any lower extremity edema.  GERD:  Patient is on Nexium daily. She reports she does have intermittent heartburn but is improved. She reports her cough seems to trigger with her reflux.  Review of Systems No chest pain, tightness, or pressure. No rashes or abnormal bruising. No focal weakness, numbness, or tingling.   No Known Allergies  Current Outpatient Prescriptions on File Prior to Visit  Medication Sig Dispense Refill  . aspirin 81 MG tablet Take 1 tablet (81 mg total) by mouth daily. 30 tablet 0  . esomeprazole (NEXIUM) 20 MG capsule Take 1 capsule (20 mg total) by mouth daily at 12 noon. 30 capsule 6  . feeding supplement, ENSURE ENLIVE, (ENSURE ENLIVE) LIQD Take 237 mLs by mouth 3 (three) times daily between meals. 90 Bottle 0  . loratadine  (CLARITIN) 10 MG tablet Take 1 tablet (10 mg total) by mouth daily. 30 tablet 0  . naproxen sodium (ANAPROX) 220 MG tablet Take 220 mg by mouth as needed (pain).    . predniSONE (DELTASONE) 10 MG tablet Take 4 tablets (48m29mnce per day for seven days.  Then take 3 tablets (30mg33mce per day for seven days.  Then take 2 tablets (20mg)39me per day for seven days.  Then take 1 tablet (10mg) 6m per day for seven days. 70 tablet 0  . sodium chloride (OCEAN) 0.65 % SOLN nasal spray Place 1 spray into both nostrils as needed for congestion. 480 mL 0  . dextromethorphan-guaiFENesin (MUCINEX DM) 30-600 MG 12hr tablet Take 1 tablet by mouth 2 (two) times daily as needed for cough. (Patient not taking: Reported on 11/28/2015) 14 tablet 0   No current facility-administered medications on file prior to visit.     Past Medical History:  Diagnosis Date  . GERD (gastroesophageal reflux disease)   . Migraines   . Sarcoidosis (HCC)  [facility]:  Procedure Laterality Date  . tuballigation      Family History  Problem Relation Age of Onset  . Hypertension Mother   . Hypertension Father   . Rheumatologic disease Neg Hx     Social History   Social History  . Marital status: Married    Spouse name: N/A  . Number of children: N/A  . Years of education: N/A   Social History Main Topics  . 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 Alcohol use No  . Drug use: No  . Sexual activity: Not Asked   Other Topics Concern  . None   Social History Narrative   Patient denies any bird or mold exposure. Currently works with exposure to inhaled perfumes and chemicals. 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 Previous exposure to inhaled dust while working in [employer]. Denies any IV drug use or previous incarceration. No known tuberculosis exposure.  She has never lived nor volunteered at a homeless shelter.      Objective:   Physical Exam BP 108/72 (BP Location: Left Arm, Patient Position: Sitting, Cuff Size: Normal)   Pulse (!) 108   Ht 5' (1.524 m)   Wt 109 lb 9.6 oz (49.7 kg)   LMP 11/09/2015 Comment: perimenapausal  SpO2 98%   BMI 21.40 kg/m  General:  Awake. Alert. No acute distress. S  Integument:  Warm & dry. No rash on exposed skin. No bruising. HEENT:  Moist mucus membranes. No oral ulcers. No scleral injection or icterus.  Cardiovascular:  Regular rate. No edema. No appreciable JVD.  Pulmonary:  Good aeration & clear to auscultation bilaterally. Symmetric chest wall expansion. No accessory muscle use. Abdomen: Soft. Normal bowel sounds. Nondistended. Grossly nontender. Musculoskeletal:  Normal bulk and tone. Hand grip strength 5/5 bilaterally. No joint deformity or effusion appreciated.  IMAGING CTA CHEST 11/01/15 (previously reviewed by me):No pulmonary emboli noted. No pleural effusion or thickening. Enlarged main pulmonary artery. No pericardial effusion appreciated. Bulky mediastinal and hilar lymphadenopathy. Bilateral upper lung cystic changes as well as subpleural consolidation within right lower lung and bilateral upper lobes. Bilateral patchy groundglass also noted.  CARDIAC TTE (11/02/15):LV normal in size with EF 55-60%.No wall motion abnormalities. Grade 1 diastolic dysfunction. LA &RA normal in size. RV normal in size and function. Pulmonary artery systolic pressure 60 mmHg. Diastolic flattening and systolic flattening of the intraventricular septum. No aortic stenosis or regurgitation. Aortic root normal in size. Trivial mitral regurgitation without stenosis. No pulmonic stenosis. Mild tricuspid regurgitation. Trivial pericardial effusion.  LABS 11/02/15 ACE:  120 ANA:  1:80 (speckled)    Assessment & Plan:  50 y.o. previously diagnosed with sarcoidosis and also with a history of childhood asthma.  Patient's findings on CT imaging could be consistent with underlying sarcoidosis. Alternatively, with her speckled pattern and mildly positive ANA she may have underlying lupus. Her echocardiogram does suggest the possibility of pulmonary hypertension with preserved right ventricular function. We will need to perform further serum workup once the patient is weaned off prednisone therapy. With her palpitations and the possibility for underlying sarcoidosis I feel that evaluation with Holter monitoring is reasonable at this time. We will need to assess her with left and right heart catheterization. I instructed the patient to contact my office if she had any new breathing problems or questions as she weaned off of prednisone therapy.  1. Sarcoidosis: Deferring biopsy at this time. Patient recommended to undergo ophthalmology evaluation. Continuing with full autoimmune workup at follow-up appointment. 2. Acute Hypoxic Respiratory Failure: Continuing oxygen at 3 L/m with exertion. Plan to repeat 6 minute walk test with oxygen titration at next appointment. 3. Possible Pulmonary Hypertension: Referring to cardiology for left and right heart catheterizations with pulmonary arterial vasodilator challenge. 4. Childhood Asthma: Checking full pulmonary function testing at follow-up appointment. Deferring RAST panel until follow-up appointment. 5. GERD: Continuing Nexium. No changes. 6. Palpitations: Checking 48 hour  Holter monitor. 7. Follow-up: Patient to return to clinic in 4 weeks or sooner if needed.  Sonia Baller Ashok Cordia, M.D. [facility] Pager:  [phone] After 3pm or if no response, call [phone] 9:55 AM 11/28/15

## 2015-11-28 NOTE — Telephone Encounter (Signed)
Routing to [facility] for follow up.

## 2015-11-28 NOTE — Telephone Encounter (Signed)
Sherri from [facility] called pt needs a rt and LT heart cath.. Please advise

## 2015-11-29 NOTE — Addendum Note (Signed)
Addended by: Inge Rise on: 11/29/2015 11:41 AM   Modules accepted: Orders

## 2015-11-29 NOTE — Telephone Encounter (Signed)
Forms were placed in [place] look at. Please advise once done. thanks

## 2015-11-29 NOTE — Telephone Encounter (Signed)
Spoke with pt, pt will pick up completed forms from office.  Nothing further needed.

## 2015-11-29 NOTE — Telephone Encounter (Signed)
Done & in folder with Mindy.

## 2015-12-05 ENCOUNTER — Ambulatory Visit (INDEPENDENT_AMBULATORY_CARE_PROVIDER_SITE_OTHER): Payer: Medicaid Other

## 2015-12-05 DIAGNOSIS — R002 Palpitations: Secondary | ICD-10-CM

## 2015-12-06 ENCOUNTER — Institutional Professional Consult (permissible substitution): Payer: Self-pay | Admitting: Pulmonary Disease

## 2015-12-08 ENCOUNTER — Other Ambulatory Visit (HOSPITAL_COMMUNITY): Payer: Self-pay | Admitting: *Deleted

## 2015-12-08 DIAGNOSIS — I272 Pulmonary hypertension, unspecified: Secondary | ICD-10-CM

## 2015-12-08 NOTE — Telephone Encounter (Signed)
[place] sch for 10/2, pt aware and instructions reviewed with her

## 2015-12-09 ENCOUNTER — Other Ambulatory Visit (INDEPENDENT_AMBULATORY_CARE_PROVIDER_SITE_OTHER): Payer: Medicaid Other

## 2015-12-09 ENCOUNTER — Telehealth: Payer: Self-pay | Admitting: Pulmonary Disease

## 2015-12-09 DIAGNOSIS — D860 Sarcoidosis of lung: Secondary | ICD-10-CM | POA: Diagnosis not present

## 2015-12-09 LAB — LACTATE DEHYDROGENASE: LDH: 370 U/L — AB (ref 100–200)

## 2015-12-09 LAB — SEDIMENTATION RATE: SED RATE: 112 mm/h — AB (ref 0–20)

## 2015-12-09 LAB — C-REACTIVE PROTEIN: CRP: 3.4 mg/dL (ref 0.5–20.0)

## 2015-12-09 MED ORDER — PREDNISONE 10 MG PO TABS: ORAL_TABLET | ORAL | 0 refills | Status: DC

## 2015-12-09 NOTE — Telephone Encounter (Signed)
Have the patient restart on Prednisone 24m daily today but not before the following blood work is done: ANA  Smith Antibody DS-DNA Antibody ESR CRP ACE LDH SSA SSB Rheumatoid Factor Anti-CCP

## 2015-12-09 NOTE — Telephone Encounter (Signed)
Pt states that she completed the Prednisone Taper x 7 days ago. Not feeling any better. Pt c/o SOB and cough with clear mucus production. Per last OV notes the patient was advised to call our office if not any better after taper.  Please advise Dr Ashok Cordia. Thanks.

## 2015-12-09 NOTE — Telephone Encounter (Signed)
Spoke with pt, aware of recs.  Labs will be drawn today.  Labs and rx ordered.  Nothing further needed at this time.

## 2015-12-12 ENCOUNTER — Encounter (HOSPITAL_COMMUNITY): Payer: Self-pay | Admitting: Cardiology

## 2015-12-12 ENCOUNTER — Encounter (HOSPITAL_COMMUNITY)
Admission: RE | Disposition: A | Discharge status: Home / Self Care | Payer: Self-pay | Source: Ambulatory Visit | Attending: Cardiology

## 2015-12-12 ENCOUNTER — Ambulatory Visit (HOSPITAL_COMMUNITY)
Admission: RE | Admit: 2015-12-12 | Discharge: 2015-12-12 | Disposition: A | Discharge status: Home / Self Care | Payer: Medicaid Other | Source: Ambulatory Visit | Attending: Cardiology | Admitting: Cardiology

## 2015-12-12 DIAGNOSIS — G43909 Migraine, unspecified, not intractable, without status migrainosus: Secondary | ICD-10-CM | POA: Insufficient documentation

## 2015-12-12 DIAGNOSIS — J9601 Acute respiratory failure with hypoxia: Secondary | ICD-10-CM | POA: Insufficient documentation

## 2015-12-12 DIAGNOSIS — Z7952 Long term (current) use of systemic steroids: Secondary | ICD-10-CM | POA: Diagnosis not present

## 2015-12-12 DIAGNOSIS — Z87891 Personal history of nicotine dependence: Secondary | ICD-10-CM | POA: Diagnosis not present

## 2015-12-12 DIAGNOSIS — Z8249 Family history of ischemic heart disease and other diseases of the circulatory system: Secondary | ICD-10-CM | POA: Diagnosis not present

## 2015-12-12 DIAGNOSIS — I2721 Secondary pulmonary arterial hypertension: Secondary | ICD-10-CM | POA: Diagnosis not present

## 2015-12-12 DIAGNOSIS — I270 Primary pulmonary hypertension: Secondary | ICD-10-CM | POA: Diagnosis not present

## 2015-12-12 DIAGNOSIS — K219 Gastro-esophageal reflux disease without esophagitis: Secondary | ICD-10-CM | POA: Insufficient documentation

## 2015-12-12 DIAGNOSIS — I272 Pulmonary hypertension, unspecified: Secondary | ICD-10-CM

## 2015-12-12 DIAGNOSIS — Z7982 Long term (current) use of aspirin: Secondary | ICD-10-CM | POA: Insufficient documentation

## 2015-12-12 DIAGNOSIS — D869 Sarcoidosis, unspecified: Secondary | ICD-10-CM | POA: Insufficient documentation

## 2015-12-12 HISTORY — PX: CARDIAC CATHETERIZATION: SHX172

## 2015-12-12 LAB — POCT I-STAT 3, VENOUS BLOOD GAS (G3P V)
Acid-base deficit: 1 mmol/L (ref 0.0–2.0)
BICARBONATE: 25.5 mmol/L (ref 20.0–28.0)
BICARBONATE: 26.1 mmol/L (ref 20.0–28.0)
O2 Saturation: 74 %
O2 Saturation: 75 %
PCO2 VEN: 47.7 mmHg (ref 44.0–60.0)
PH VEN: 7.338 (ref 7.250–7.430)
PH VEN: 7.346 (ref 7.250–7.430)
PO2 VEN: 42 mmHg (ref 32.0–45.0)
TCO2: 27 mmol/L (ref 0–100)
TCO2: 28 mmol/L (ref 0–100)
pCO2, Ven: 47.6 mmHg (ref 44.0–60.0)
pO2, Ven: 42 mmHg (ref 32.0–45.0)

## 2015-12-12 LAB — CBC
HCT: 41.4 % (ref 36.0–46.0)
Hemoglobin: 13.4 g/dL (ref 12.0–15.0)
MCH: 29 pg (ref 26.0–34.0)
MCHC: 32.4 g/dL (ref 30.0–36.0)
MCV: 89.6 fL (ref 78.0–100.0)
PLATELETS: 414 10*3/uL — AB (ref 150–400)
RBC: 4.62 MIL/uL (ref 3.87–5.11)
RDW: 14.2 % (ref 11.5–15.5)
WBC: 7.8 10*3/uL (ref 4.0–10.5)

## 2015-12-12 LAB — BASIC METABOLIC PANEL
Anion gap: 4 — ABNORMAL LOW (ref 5–15)
BUN: 17 mg/dL (ref 6–20)
CALCIUM: 9.1 mg/dL (ref 8.9–10.3)
CO2: 28 mmol/L (ref 22–32)
CREATININE: 0.8 mg/dL (ref 0.44–1.00)
Chloride: 106 mmol/L (ref 101–111)
GFR calc Af Amer: >60 mL/min (ref >60)
Glucose, Bld: 91 mg/dL (ref 65–99)
Potassium: 4.1 mmol/L (ref 3.5–5.1)
SODIUM: 138 mmol/L (ref 135–145)

## 2015-12-12 LAB — ANA: Anti Nuclear Antibody(ANA): NEGATIVE

## 2015-12-12 LAB — SJOGRENS SYNDROME-B EXTRACTABLE NUCLEAR ANTIBODY: SSB (La) (ENA) Antibody, IgG: <1

## 2015-12-12 LAB — PROTIME-INR
INR: 1.03
Prothrombin Time: 13.5 seconds (ref 11.4–15.2)

## 2015-12-12 LAB — RHEUMATOID FACTOR

## 2015-12-12 LAB — SJOGRENS SYNDROME-A EXTRACTABLE NUCLEAR ANTIBODY: SSA (RO) (ENA) ANTIBODY, IGG: NEGATIVE

## 2015-12-12 LAB — ANTI-SMITH ANTIBODY: ENA SM Ab Ser-aCnc: <1

## 2015-12-12 LAB — CYCLIC CITRUL PEPTIDE ANTIBODY, IGG

## 2015-12-12 LAB — ANTI-DNA ANTIBODY, DOUBLE-STRANDED: DS DNA AB: 1 [IU]/mL

## 2015-12-12 SURGERY — RIGHT HEART CATH

## 2015-12-12 MED ORDER — FENTANYL CITRATE (PF) 100 MCG/2ML IJ SOLN
INTRAMUSCULAR | Status: DC | PRN
  Administered 2015-12-12: 12.5 ug via INTRAVENOUS

## 2015-12-12 MED ORDER — HEPARIN (PORCINE) IN NACL 2-0.9 UNIT/ML-% IJ SOLN
INTRAMUSCULAR | Status: DC | PRN
  Administered 2015-12-12: 1000 mL

## 2015-12-12 MED ORDER — ASPIRIN 81 MG PO CHEW
CHEWABLE_TABLET | ORAL | Status: AC
  Filled 2015-12-12: qty 1

## 2015-12-12 MED ORDER — MIDAZOLAM HCL 2 MG/2ML IJ SOLN
INTRAMUSCULAR | Status: DC | PRN
  Administered 2015-12-12: 0.5 mg via INTRAVENOUS

## 2015-12-12 MED ORDER — SODIUM CHLORIDE 0.9 % IV SOLN
INTRAVENOUS | Status: DC
  Administered 2015-12-12: 09:00:00 via INTRAVENOUS

## 2015-12-12 MED ORDER — SODIUM CHLORIDE 0.9% FLUSH: 3.0000 mL | INTRAVENOUS | Status: DC | PRN

## 2015-12-12 MED ORDER — ACETAMINOPHEN 325 MG PO TABS: 650.0000 mg | ORAL_TABLET | ORAL | Status: DC | PRN

## 2015-12-12 MED ORDER — HEPARIN (PORCINE) IN NACL 2-0.9 UNIT/ML-% IJ SOLN
INTRAMUSCULAR | Status: AC
  Filled 2015-12-12: qty 500

## 2015-12-12 MED ORDER — MIDAZOLAM HCL 2 MG/2ML IJ SOLN
INTRAMUSCULAR | Status: AC
  Filled 2015-12-12: qty 2

## 2015-12-12 MED ORDER — SODIUM CHLORIDE 0.9 % IV SOLN: 250.0000 mL | INTRAVENOUS | Status: DC | PRN

## 2015-12-12 MED ORDER — SODIUM CHLORIDE 0.9% FLUSH: 3.0000 mL | Freq: Two times a day (BID) | INTRAVENOUS | Status: DC

## 2015-12-12 MED ORDER — ASPIRIN 81 MG PO CHEW
81.0000 mg | CHEWABLE_TABLET | ORAL | Status: AC
Start: 2015-12-12 — End: 2015-12-12
  Administered 2015-12-12: 81 mg via ORAL

## 2015-12-12 MED ORDER — ONDANSETRON HCL 4 MG/2ML IJ SOLN: 4.0000 mg | Freq: Four times a day (QID) | INTRAMUSCULAR | Status: DC | PRN

## 2015-12-12 MED ORDER — FENTANYL CITRATE (PF) 100 MCG/2ML IJ SOLN
INTRAMUSCULAR | Status: AC
  Filled 2015-12-12: qty 2

## 2015-12-12 MED ORDER — LIDOCAINE HCL (PF) 1 % IJ SOLN
INTRAMUSCULAR | Status: DC | PRN
Start: 2015-12-12 — End: 2015-12-12
  Administered 2015-12-12: 2 mL

## 2015-12-12 MED ORDER — LIDOCAINE HCL (PF) 1 % IJ SOLN
INTRAMUSCULAR | Status: AC
  Filled 2015-12-12: qty 30

## 2015-12-12 SURGICAL SUPPLY — 11 items
CATH BALLN WEDGE 5F 110CM (CATHETERS) ×3 IMPLANT
GUIDEWIRE .025 260CM (WIRE) ×3 IMPLANT
PACK CARDIAC CATHETERIZATION (CUSTOM PROCEDURE TRAY) ×3 IMPLANT
PROTECTION STATION PRESSURIZED (MISCELLANEOUS) ×3
SHEATH FAST CATH BRACH 5F 5CM (SHEATH) ×3 IMPLANT
STATION PROTECTION PRESSURIZED (MISCELLANEOUS) ×1 IMPLANT
TRANSDUCER W/STOPCOCK (MISCELLANEOUS) ×3 IMPLANT
TUBING ART PRESS 72  MALE/FEM (TUBING) ×2
TUBING ART PRESS 72 MALE/FEM (TUBING) ×1 IMPLANT
TUBING CIL FLEX 10 FLL-RA (TUBING) ×3 IMPLANT
WIRE EMERALD 3MM-J .025X260CM (WIRE) ×3 IMPLANT

## 2015-12-12 NOTE — Interval H&P Note (Signed)
History and Physical Interval Note:  12/12/2015 10:35 AM  Erin Zavala  has presented today for surgery, with the diagnosis of hp  The various methods of treatment have been discussed with the patient and family. After consideration of risks, benefits and other options for treatment, the patient has consented to  Procedure(s): Right Heart Cath (N/A) as a surgical intervention .  The patient's history has been reviewed, patient examined, no change in status, stable for surgery.  I have reviewed the patient's chart and labs.  Questions were answered to the patient's satisfaction.    Patient had evidence for pulmonary hypertension by echo.  We will plan RHC to further evaluate PH in setting of suspected sarcoidosis and possible lupus.    Elder Davidian [employer]

## 2015-12-12 NOTE — H&P (View-Only) (Signed)
 Subjective:    Patient ID: Erin Zavala, female    DOB: 03/12/1965, 50 y.o.   MRN: [id number]  C.C.:  Follow-up for Possible Sarcoidosis, Acute Hypoxic Respiratory Failure, Childhood Asthma, Possible Pulmonary Hypertension, & GERD.  HPI Possible Sarcoidosis:  Started on Prednisone 40mg daily during last hospitalization. Previously diagnosed in 1997. She reports her dyspnea has improved significantly. She reports her cough has significantly improved and is only intermittent. No syncope or near syncope.  No eye swelling, pain, or erythema. She has tapered to Prednisone 10mg daily from 40mg previously. Her taper finishes on Friday.   Acute Hypoxic Respiratory Failure:  Required 3 L/m on exertion at last appointment to maintain her saturation. However she has been using 2 L/m since her discharge per her report. Brief walk test today continues to show a 3 L/m requirement with exertion.  Childhood Asthma:  Per patient's report. Not currently on any inhaled medications. She reports she has no nocturnal coughing or awakening with any breathing problems.   Possible Pulmonary Hypertension:  She reports she has had intermittent palpitations. Denies any lower extremity edema.  GERD:  Patient is on Nexium daily. She reports she does have intermittent heartburn but is improved. She reports her cough seems to trigger with her reflux.  Review of Systems No chest pain, tightness, or pressure. No rashes or abnormal bruising. No focal weakness, numbness, or tingling.   No Known Allergies  Current Outpatient Prescriptions on File Prior to Visit  Medication Sig Dispense Refill  . aspirin 81 MG tablet Take 1 tablet (81 mg total) by mouth daily. 30 tablet 0  . esomeprazole (NEXIUM) 20 MG capsule Take 1 capsule (20 mg total) by mouth daily at 12 noon. 30 capsule 6  . feeding supplement, ENSURE ENLIVE, (ENSURE ENLIVE) LIQD Take 237 mLs by mouth 3 (three) times daily between meals. 90 Bottle 0  . loratadine  (CLARITIN) 10 MG tablet Take 1 tablet (10 mg total) by mouth daily. 30 tablet 0  . naproxen sodium (ANAPROX) 220 MG tablet Take 220 mg by mouth as needed (pain).    . predniSONE (DELTASONE) 10 MG tablet Take 4 tablets (40mg) once per day for seven days.  Then take 3 tablets (30mg) once per day for seven days.  Then take 2 tablets (20mg) once per day for seven days.  Then take 1 tablet (10mg) once per day for seven days. 70 tablet 0  . sodium chloride (OCEAN) 0.65 % SOLN nasal spray Place 1 spray into both nostrils as needed for congestion. 480 mL 0  . dextromethorphan-guaiFENesin (MUCINEX DM) 30-600 MG 12hr tablet Take 1 tablet by mouth 2 (two) times daily as needed for cough. (Patient not taking: Reported on 11/28/2015) 14 tablet 0   No current facility-administered medications on file prior to visit.     Past Medical History:  Diagnosis Date  . GERD (gastroesophageal reflux disease)   . Migraines   . Sarcoidosis (HCC)     Past Surgical History:  Procedure Laterality Date  . tuballigation      Family History  Problem Relation Age of Onset  . Hypertension Mother   . Hypertension Father   . Rheumatologic disease Neg Hx     Social History   Social History  . Marital status: Married    Spouse name: N/A  . Number of children: N/A  . Years of education: N/A   Social History Main Topics  . Smoking status: Former Smoker  . Smokeless tobacco: Never Used       Comment: former occasional social smoker  . Alcohol use No  . Drug use: No  . Sexual activity: Not Asked   Other Topics Concern  . None   Social History Narrative   Patient denies any bird or mold exposure. Currently works with exposure to inhaled perfumes and chemicals. 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 Denies any IV drug use or previous incarceration. No known tuberculosis exposure.  She has never lived nor volunteered at a homeless shelter.      Objective:   Physical Exam BP 108/72 (BP Location: Left Arm, Patient Position: Sitting, Cuff Size: Normal)   Pulse (!) 108   Ht 5' (1.524 m)   Wt 109 lb 9.6 oz (49.7 kg)   LMP 11/09/2015 Comment: perimenapausal  SpO2 98%   BMI 21.40 kg/m  General:  Awake. Alert. No acute distress. S  Integument:  Warm & dry. No rash on exposed skin. No bruising. HEENT:  Moist mucus membranes. No oral ulcers. No scleral injection or icterus.  Cardiovascular:  Regular rate. No edema. No appreciable JVD.  Pulmonary:  Good aeration & clear to auscultation bilaterally. Symmetric chest wall expansion. No accessory muscle use. Abdomen: Soft. Normal bowel sounds. Nondistended. Grossly nontender. Musculoskeletal:  Normal bulk and tone. Hand grip strength 5/5 bilaterally. No joint deformity or effusion appreciated.  IMAGING CTA CHEST 11/01/15 (previously reviewed by me):No pulmonary emboli noted. No pleural effusion or thickening. Enlarged main pulmonary artery. No pericardial effusion appreciated. Bulky mediastinal and hilar lymphadenopathy. Bilateral upper lung cystic changes as well as subpleural consolidation within right lower lung and bilateral upper lobes. Bilateral patchy groundglass also noted.  CARDIAC TTE (11/02/15):LV normal in size with EF 55-60%.No wall motion abnormalities. Grade 1 diastolic dysfunction. LA &RA normal in size. RV normal in size and function. Pulmonary artery systolic pressure 60 mmHg. Diastolic flattening and systolic flattening of the intraventricular septum. No aortic stenosis or regurgitation. Aortic root normal in size. Trivial mitral regurgitation without stenosis. No pulmonic stenosis. Mild tricuspid regurgitation. Trivial pericardial effusion.  LABS 11/02/15 ACE:  120 ANA:  1:80 (speckled)    Assessment & Plan:  50 y.o. previously diagnosed with sarcoidosis and also with a history of childhood asthma.  Patient's findings on CT imaging could be consistent with underlying sarcoidosis. Alternatively, with her speckled pattern and mildly positive ANA she may have underlying lupus. Her echocardiogram does suggest the possibility of pulmonary hypertension with preserved right ventricular function. We will need to perform further serum workup once the patient is weaned off prednisone therapy. With her palpitations and the possibility for underlying sarcoidosis I feel that evaluation with Holter monitoring is reasonable at this time. We will need to assess her with left and right heart catheterization. I instructed the patient to contact my office if she had any new breathing problems or questions as she weaned off of prednisone therapy.  1. Sarcoidosis: Deferring biopsy at this time. Patient recommended to undergo ophthalmology evaluation. Continuing with full autoimmune workup at follow-up appointment. 2. Acute Hypoxic Respiratory Failure: Continuing oxygen at 3 L/m with exertion. Plan to repeat 6 minute walk test with oxygen titration at next appointment. 3. Possible Pulmonary Hypertension: Referring to cardiology for left and right heart catheterizations with pulmonary arterial vasodilator challenge. 4. Childhood Asthma: Checking full pulmonary function testing at follow-up appointment. Deferring RAST panel until follow-up appointment. 5. GERD: Continuing Nexium. No changes. 6. Palpitations: Checking 48 hour   Holter monitor. 7. Follow-up: Patient to return to clinic in 4 weeks or sooner if needed.  Jermal Dismuke E. Milferd Ansell, M.D. [facility] Pager:  [phone] After 3pm or if no response, call [phone] 9:55 AM 11/28/15    

## 2015-12-12 NOTE — Discharge Instructions (Signed)
Angiogram, Care After °Refer to this sheet in the next few weeks. These instructions provide you with information about caring for yourself after your procedure. Your health care provider may also give you more specific instructions. Your treatment has been planned according to current medical practices, but problems sometimes occur. Call your health care provider if you have any problems or questions after your procedure. °WHAT TO EXPECT AFTER THE PROCEDURE °After your procedure, it is typical to have the following: °· Bruising at the catheter insertion site that usually fades within 1-2 weeks. °· Blood collecting in the tissue (hematoma) that may be painful to the touch. It should usually decrease in size and tenderness within 1-2 weeks. °HOME CARE INSTRUCTIONS °· Take medicines only as directed by your health care provider. °· You may shower 24-48 hours after the procedure or as directed by your health care provider. Remove the bandage (dressing) and gently wash the site with plain soap and water. Pat the area dry with a clean towel. Do not rub the site, because this may cause bleeding. °· Do not take baths, swim, or use a hot tub until your health care provider approves. °· Check your insertion site every day for redness, swelling, or drainage. °· Do not apply powder or lotion to the site. °· Do not lift over 10 lb (4.5 kg) for 5 days after your procedure or as directed by your health care provider. °· Ask your health care provider when it is okay to: °¨ Return to work or school. °¨ Resume usual physical activities or sports. °¨ Resume sexual activity. °· Do not drive home if you are discharged the same day as the procedure. Have someone else drive you. °· You may drive 24 hours after the procedure unless otherwise instructed by your health care provider. °· Do not operate machinery or power tools for 24 hours after the procedure or as directed by your health care provider. °· If your procedure was done as an  outpatient procedure, which means that you went home the same day as your procedure, a responsible adult should be with you for the first 24 hours after you arrive home. °· Keep all follow-up visits as directed by your health care provider. This is important. °SEEK MEDICAL CARE IF: °· You have a fever. °· You have chills. °· You have increased bleeding from the catheter insertion site. Hold pressure on the site. °SEEK IMMEDIATE MEDICAL CARE IF: °· You have unusual pain at the catheter insertion site. °· You have redness, warmth, or swelling at the catheter insertion site. °· You have drainage (other than a small amount of blood on the dressing) from the catheter insertion site. °· The catheter insertion site is bleeding, and the bleeding does not stop after 30 minutes of holding steady pressure on the site. °· The area near or just beyond the catheter insertion site becomes pale, cool, tingly, or numb. °  °This information is not intended to replace advice given to you by your health care provider. Make sure you discuss any questions you have with your health care provider. °  °Document Released: 09/14/2004 Document Revised: 03/19/2014 Document Reviewed: 07/30/2012 °Elsevier Interactive Patient Education ©2016 [employer] ° °

## 2015-12-13 LAB — ANGIOTENSIN CONVERTING ENZYME: ANGIOTENSIN-CONVERTING ENZYME: 136 U/L — AB (ref 9–67)

## 2015-12-14 ENCOUNTER — Telehealth (HOSPITAL_COMMUNITY): Payer: Self-pay | Admitting: Cardiology

## 2015-12-14 NOTE — Telephone Encounter (Signed)
NP appt scheduled for 01/02/17@ 1140 am, pt aware and voiced understand.  np packet in the mail   Message to [facility] Pharmacist to assist with [place]  new  Med start

## 2015-12-14 NOTE — Telephone Encounter (Signed)
-----  Message from Larey Dresser, MD sent at 12/12/2015  2:18 PM EDT ----- Nira Conn, this patient needs followup with me for pulmonary hypertension, needs to start Opsumit 10 daily.

## 2015-12-15 ENCOUNTER — Ambulatory Visit (HOSPITAL_COMMUNITY)
Admission: RE | Admit: 2015-12-15 | Discharge: 2015-12-15 | Disposition: A | Discharge status: Home / Self Care | Payer: Medicaid Other | Source: Ambulatory Visit | Attending: Internal Medicine | Admitting: Internal Medicine

## 2015-12-15 DIAGNOSIS — Z3202 Encounter for pregnancy test, result negative: Secondary | ICD-10-CM | POA: Diagnosis not present

## 2015-12-15 DIAGNOSIS — D860 Sarcoidosis of lung: Secondary | ICD-10-CM | POA: Diagnosis present

## 2015-12-15 LAB — PREGNANCY, URINE: PREG TEST UR: NEGATIVE

## 2015-12-27 ENCOUNTER — Telehealth: Payer: Self-pay | Admitting: Licensed Clinical Social Worker

## 2015-12-27 ENCOUNTER — Ambulatory Visit (HOSPITAL_COMMUNITY)
Admission: RE | Admit: 2015-12-27 | Discharge: 2015-12-27 | Disposition: A | Discharge status: Home / Self Care | Payer: Medicaid Other | Source: Ambulatory Visit | Attending: Pulmonary Disease | Admitting: Pulmonary Disease

## 2015-12-27 DIAGNOSIS — D869 Sarcoidosis, unspecified: Secondary | ICD-10-CM | POA: Diagnosis not present

## 2015-12-27 DIAGNOSIS — R942 Abnormal results of pulmonary function studies: Secondary | ICD-10-CM | POA: Diagnosis not present

## 2015-12-27 LAB — PULMONARY FUNCTION TEST
DL/VA % pred: 47 %
DL/VA: 2.02 ml/min/mmHg/L
DLCO UNC: 3.85 ml/min/mmHg
DLCO unc % pred: 20 %
FEF 25-75 Post: 0.65 L/sec
FEF 25-75 Pre: 1.03 L/sec
FEF2575-%Change-Post: -36 %
FEF2575-%Pred-Post: 29 %
FEF2575-%Pred-Pre: 46 %
FEV1-%CHANGE-POST: -4 %
FEV1-%PRED-PRE: 60 %
FEV1-%Pred-Post: 58 %
FEV1-POST: 1.16 L
FEV1-Pre: 1.21 L
FEV1FVC-%Change-Post: -6 %
FEV1FVC-%Pred-Pre: 96 %
FEV6-%Change-Post: 2 %
FEV6-%PRED-PRE: 63 %
FEV6-%Pred-Post: 65 %
FEV6-POST: 1.56 L
FEV6-Pre: 1.53 L
FEV6FVC-%PRED-POST: 103 %
FEV6FVC-%PRED-PRE: 103 %
FVC-%Change-Post: 2 %
FVC-%PRED-PRE: 61 %
FVC-%Pred-Post: 63 %
FVC-POST: 1.56 L
FVC-PRE: 1.53 L
POST FEV6/FVC RATIO: 100 %
PRE FEV6/FVC RATIO: 100 %
Post FEV1/FVC ratio: 74 %
Pre FEV1/FVC ratio: 79 %
RV % PRED: 70 %
RV: 1.12 L
TLC % PRED: 58 %
TLC: 2.62 L

## 2015-12-27 MED ORDER — ALBUTEROL SULFATE (2.5 MG/3ML) 0.083% IN NEBU
2.5000 mg | INHALATION_SOLUTION | Freq: Once | RESPIRATORY_TRACT | Status: AC
  Administered 2015-12-27: 2.5 mg via RESPIRATORY_TRACT

## 2015-12-27 NOTE — Telephone Encounter (Signed)
CSW was referred for insurance assistance. CSW contacted pt about insurance assistance program and encouraged her to apply for orange card to meet her insurance needs.  Pt stated she will make an appt. with DSS and fill out an orange card application. Pt currently unemployed and seeking new opportunities. Pt verbalized understanding of resources provided and CSW will be available as needed in the future.Raquel Sarna, LCSW [phone]

## 2015-12-28 ENCOUNTER — Encounter: Payer: Self-pay | Admitting: Pulmonary Disease

## 2015-12-28 ENCOUNTER — Ambulatory Visit (INDEPENDENT_AMBULATORY_CARE_PROVIDER_SITE_OTHER): Payer: Medicaid Other | Admitting: Pulmonary Disease

## 2015-12-28 VITALS — BP 118/70 | HR 98 | Ht 60.0 in | Wt 117.2 lb

## 2015-12-28 DIAGNOSIS — J9611 Chronic respiratory failure with hypoxia: Secondary | ICD-10-CM | POA: Diagnosis not present

## 2015-12-28 DIAGNOSIS — R590 Localized enlarged lymph nodes: Secondary | ICD-10-CM

## 2015-12-28 DIAGNOSIS — J209 Acute bronchitis, unspecified: Secondary | ICD-10-CM | POA: Insufficient documentation

## 2015-12-28 DIAGNOSIS — I272 Pulmonary hypertension, unspecified: Secondary | ICD-10-CM | POA: Diagnosis not present

## 2015-12-28 DIAGNOSIS — K219 Gastro-esophageal reflux disease without esophagitis: Secondary | ICD-10-CM

## 2015-12-28 DIAGNOSIS — D869 Sarcoidosis, unspecified: Secondary | ICD-10-CM

## 2015-12-28 MED ORDER — BECLOMETHASONE DIPROPIONATE 80 MCG/ACT IN AERS: 2.0000 | INHALATION_SPRAY | Freq: Two times a day (BID) | RESPIRATORY_TRACT | 3 refills | Status: DC

## 2015-12-28 MED ORDER — PREDNISONE 10 MG PO TABS: ORAL_TABLET | ORAL | 3 refills | Status: DC

## 2015-12-28 NOTE — Progress Notes (Signed)
Subjective:    Patient ID: Erin Zavala, female    DOB: 1965/05/21, 50 y.o.   MRN: [id number]  C.C.:  Follow-up for Probable Sarcoidosis, Chronic Hypoxic Respiratory Failure, Moderate Pulmonary Hypertension, Palpitations, & GERD.  HPI  She reports starting about 2 days ago she developed a cough productive of a yellowish phlegm. She does have a sick contact through her grand-daughter.   Probable Sarcoidosis:  Tapered to Prednisone 22m daily.  She has an appointment on 11/2 with Ophthalmology to be seen. No syncope or near syncope. She has been trying to exercise some.  No wheezing. Dyspnea is improving.  Chronic Hypoxic Respiratory Failure:  Continuing oxygen at 2 L/m at rest & 3 L/m with exertion.   Moderate Pulmonary Hypertension:  Confirmed with RHC on 10/2. Patient to be evaluated by Cardiology on 10/25. Started on Macitentan.  Palpitations: PACs and PVCs noted on 48 hour Holter monitor. She reports she has been having less palpitations since she started Macitentan.   GERD:  On Nexium. She reports intermittent reflux depending on diet. No morning brash water taste.   Review of Systems Denies any fatigue. No muscle aches or pains. No nausea, emesis, or diarrhea. No fever, chills, or sweats. No sore throat or sinus congestion.   No Known Allergies  Current Outpatient Prescriptions on File Prior to Visit  Medication Sig Dispense Refill  . aspirin 81 MG tablet Take 1 tablet (81 mg total) by mouth daily. 30 tablet 0  . dextromethorphan-guaiFENesin (MUCINEX DM) 30-600 MG 12hr tablet Take 1 tablet by mouth 2 (two) times daily as needed for cough. 14 tablet 0  . feeding supplement, ENSURE ENLIVE, (ENSURE ENLIVE) LIQD Take 237 mLs by mouth 3 (three) times daily between meals. 90 Bottle 0  . naproxen sodium (ANAPROX) 220 MG tablet Take 220 mg by mouth daily as needed (pain).    . predniSONE (DELTASONE) 10 MG tablet 1 tablet by mouth daily 30 tablet 0  . sodium chloride (OCEAN) 0.65 % SOLN  nasal spray Place 1 spray into both nostrils as needed for congestion. 480 mL 0   No current facility-administered medications on file prior to visit.     Past Medical History:  Diagnosis Date  . GERD (gastroesophageal reflux disease)   . Migraines   . Sarcoidosis ([facility]     Past Surgical History:  Procedure Laterality Date  . CARDIAC CATHETERIZATION N/A 12/12/2015   Procedure: Right Heart Cath;  Surgeon: DLarey Dresser MD;  [facility];  Service: Cardiovascular;  Laterality: N/A;  . tuballigation      Family History  Problem Relation Age of Onset  . Hypertension Mother   . Hypertension Father   . Rheumatologic disease Neg Hx     Social History   Social History  . Marital status: Married    Spouse name: N/A  . Number of children: N/A  . Years of education: N/A   Social History Main Topics  . Smoking status: Former SResearch scientist (life sciences) . Smokeless tobacco: Never Used     Comment: former occasional social smoker  . Alcohol use No  . Drug use: No  . Sexual activity: Not Asked   Other Topics Concern  . None   Social History Narrative   Patient denies any bird or mold exposure. Currently works with exposure to inhaled perfumes and chemicals. 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 Previous exposure to inhaled dust while working in [employer] Denies  any IV drug use or previous incarceration. No known tuberculosis exposure. She has never lived nor volunteered at a homeless shelter.      Objective:   Physical Exam BP 118/70 (BP Location: Right Arm, Cuff Size: Normal)   Pulse 98   Ht 5' (1.524 m)   Wt 117 lb 3.2 oz (53.2 kg)   SpO2 95%   BMI 22.89 kg/m  General:  Awake. Alert. Comfortable.   Integument:  Warm & dry. No rash on exposed skin.  HEENT:  Moist mucus membranes. No oral ulcers. No nasal turbinate swelling.  Cardiovascular:  Regular rate. No edema. Normal S1 & S2. Pulmonary:  Clear  with auscultation. Good aeration bilaterally and normal work of breathing on supplemental oxygen. Abdomen: Soft. Normal bowel sounds. Nontender. Musculoskeletal:  Normal bulk and tone. No joint deformity or effusion appreciated.  PFT 12/27/15: FVC 1.53 L (61%) FEV1 1.21 L (60%) FEV1/FVC 0.79 FEF 25-75 1.03 L (46%) negative bronchodilator response TLC 2.62 L (58%) RV 70% ERV 109% DLCO unreliable  6MWT 12/28/15:  Walked 264 meters / Baseline Sat 100% on 2 L/m / Nadir Sat 88% on 2 L/m (required 3 L/m to maintain saturation with exertion)  IMAGING CTA CHEST 11/01/15 (previously reviewed by me):No pulmonary emboli noted. No pleural effusion or thickening. Enlarged main pulmonary artery. No pericardial effusion appreciated. Bulky mediastinal and hilar lymphadenopathy. Bilateral upper lung cystic changes as well as subpleural consolidation within right lower lung and bilateral upper lobes. Bilateral patchy groundglass also noted.  CARDIAC RHC (12/12/15): RA Mean - 3 RV - 55/1 PA - 59/22 (36) PCWP - 5 PA Sat - 75% Aortic Sat - 100% CO - 4.2 CI - 2.92 PVR - 7.4 WU  48 HOUR HOLTER MONITOR (12/05/15):  Sinus rhythm with PVCs & PACs. HR 70-160.  TTE (11/02/15):LV normal in size with EF 55-60%.No wall motion abnormalities. Grade 1 diastolic dysfunction. LA &RA normal in size. RV normal in size and function. Pulmonary artery systolic pressure 60 mmHg. Diastolic flattening and systolic flattening of the intraventricular septum. No aortic stenosis or regurgitation. Aortic root normal in size. Trivial mitral regurgitation without stenosis. No pulmonic stenosis. Mild tricuspid regurgitation. Trivial pericardial effusion.  LABS 12/09/15 LDH:  370 CRP:  3.4 ESR:  112 ANA:  Negative ACE:  136 Anti-CCP:  <16 RF:  <14 Smith Ab:  <1.0 DS DNA Ab:  1 SSA:  <1.0 SSB:  <1.0  11/02/15 ACE:  120 ANA:  1:80 (speckled)    Assessment & Plan:  50 y.o. previously diagnosed with probable sarcoidosis and  also with a history of childhood asthma.  Patient's right heart catheterization does show moderate pulmonary hypertension. She has been started on a pulmonary arterial vasodilator by cardiology and appreciate assistance in management. Patient's probable underlying sarcoidosis needs to be confirmed with biopsy before I refer her for a lung transplant evaluation. Symptomatically she seems to be well-controlled at this time and I am continuing on her current dose of prednisone wall deferring steroid sparing agents such as infliximab and methotrexate for now. She will continue on oxygen at 3 L/m with exertion and 2 L/m at rest. This was communicated to her today. I am starting her on inhaled corticosteroid therapy in an effort to decrease her pulmonary sarcoidosis activity. She does have an upcoming appointment with ophthalmology. I instructed the patient to notify my office if she had any worsening in her breathing or cough with her acute bronchitis.  1. Acute Bronchitis:  Holding on antibiotics for now.  Suspect viral in etiology. 2. Probable Sarcoidosis: Continuing Prednisone 42m daily. Referring to CVTS for mediastinal lymph node biopsy. Starting Qvar 816m 2 inhalations twice daily. Holding off on lung transplant referral for now. 3. Chronic Hypoxic Respiratory Failure:  Continuing oxygen at 2 L/m at rest & 3 L/m with exertion. 4. Possible Pulmonary Hypertension:  Following with Cardiology. Currently on Macitentan. Referring to Pulmonary Rehab. 5. Palpitations:  Improving. Deferring to Cardiology. Improving. 6. GERD: Continuing Nexium. No changes. 7. Health Maintenance:  Pneumovax 02 November 2015. Recommended Influenza Vaccine in 2 weeks.  8. Follow-up: Patient to return to clinic in 8 weeks or sooner if needed.   JeSonia BallereAshok CordiaM.D. [facility] Pager:  [phone] 3pm or if no response, call [phone]:11 PM 12/28/15

## 2015-12-28 NOTE — Patient Instructions (Signed)
Continue taking your Prednisone 9m daily for now.  I'm starting you on Qvar - inhale 2 puffs twice daily.  Make sure you rinse, gargle, brush your tongue, brush your teeth, and spit after the Qvar to keep from getting thrush.  Make sure you get your flu shot in a couple of weeks once you get better.  Call me if you have any questions or concerns.

## 2015-12-28 NOTE — Progress Notes (Signed)
Test reviewed.  

## 2015-12-29 ENCOUNTER — Telehealth: Payer: Self-pay | Admitting: Pulmonary Disease

## 2015-12-29 NOTE — Telephone Encounter (Signed)
Opened in error

## 2015-12-29 NOTE — Telephone Encounter (Signed)
lmtcb x1 for pt. 

## 2015-12-30 NOTE — Telephone Encounter (Signed)
Spoke with pt. States that Qvar is going to cost her $199 a month. She can't afford this. I have advised her to contact her insurance company for her drug formulary. She agreed and will call us once she speaks with her insurance.

## 2016-01-02 ENCOUNTER — Encounter: Payer: Self-pay | Admitting: Thoracic Surgery (Cardiothoracic Vascular Surgery)

## 2016-01-02 ENCOUNTER — Telehealth (HOSPITAL_COMMUNITY): Payer: Self-pay | Admitting: *Deleted

## 2016-01-02 ENCOUNTER — Institutional Professional Consult (permissible substitution) (INDEPENDENT_AMBULATORY_CARE_PROVIDER_SITE_OTHER): Payer: Medicaid Other | Admitting: Thoracic Surgery (Cardiothoracic Vascular Surgery)

## 2016-01-02 ENCOUNTER — Other Ambulatory Visit: Payer: Self-pay | Admitting: *Deleted

## 2016-01-02 VITALS — BP 107/76 | HR 88 | Resp 16 | Ht 60.0 in | Wt 116.4 lb

## 2016-01-02 DIAGNOSIS — R590 Localized enlarged lymph nodes: Secondary | ICD-10-CM | POA: Diagnosis not present

## 2016-01-02 NOTE — Progress Notes (Signed)
PCP is No PCP Per Patient Referring Provider is Javier Glazier, MD  Chief Complaint  Patient presents with  . Adenopathy    mediastinal/hilar....referral for BX.Marland KitchenMarland KitchenCTA CHEST 11/01/15, PFT 12/1715, ECHO 11/02/15    HPI:  Ms. Witts is a 50 year old woman sent for consultation regarding mediastinal adenopathy.  Ms. Tavano is a 50 year old with a history of ocular sarcoidosis, gastroesophageal reflux and asthma. She was diagnosed with ocular sarcoidosis in 1997. She was treated for a few years and did not have any further issues with that. Apparently she had some scarring in her lungs but no respiratory symptoms. Over the past year she has been feeling short of breath and having a chronic cough. Cough is generally nonproductive but occasionally has some white sputum. She initially was only short of breath with heavy exertion. However over the past few months it has been rapidly progressive and she is short of breath even with minimal exertion such as walking across the room. She progressed to the point where she was short of breath at rest. She was admitted to the hospital on 11/01/2015. She had a CT of the chest to rule out a pulmonary embolus. There was no PE, but there was severe diffuse airspace disease and hilar and mediastinal adenopathy.  She saw Dr. Ashok Cordia and was started on steroids. Right heart catheterization showed pulmonary hypertension with a systolic of 59 mmHg. Her CVP was normal and she had no evidence of right heart failure. Point function testing showed moderate decrease in FVC and FEV1 both the proximal 60% of predicted. There was a severe decrease in her diffusion capacity at 20% of predicted.  She currently is on 2 L nasal cannula oxygen at rest and 3 L with exertion. She typically is not short of breath at rest without oxygen. She does get short of breath if she tries to walk up stairs. She lost about 21 pounds over the past 6 months but is gaining back about 12 or 14 pounds over  the past 2 months. She previously smoked, but quit in 2003.  Zubrod Score: At the time of surgery this patient's most appropriate activity status/level should be described as: _0     0    Normal activity, no symptoms _1     1    Restricted in physical strenuous activity but ambulatory, able to do out light work _2     2    Ambulatory and capable of self care, unable to do work activities, up and about >50 % of waking hours                              _3     3    Only limited self care, in bed greater than 50% of waking hours _4     4    Completely disabled, no self care, confined to bed or chair _5     5    Moribund  Past Medical History:  Diagnosis Date  . GERD (gastroesophageal reflux disease)   . Migraines   . Sarcoidosis [facility])     Past Surgical History:  Procedure Laterality Date  . CARDIAC CATHETERIZATION N/A 12/12/2015   Procedure: Right Heart Cath;  Surgeon: Larey Dresser, MD;  Location: [facility];  Service: Cardiovascular;  Laterality: N/A;  . tuballigation      Family History  Problem Relation Age of Onset  . Hypertension Mother   . Hypertension Father   . Rheumatologic  disease Neg Hx     Social History Social History  Substance Use Topics  . Smoking status: Former Smoker  . Smokeless tobacco: Never Used     Comment: former occasional social smoker  . Alcohol use No    Current Outpatient Prescriptions  Medication Sig Dispense Refill  . aspirin 81 MG tablet Take 1 tablet (81 mg total) by mouth daily. 30 tablet 0  . beclomethasone (QVAR) 80 MCG/ACT inhaler Inhale 2 puffs into the lungs 2 (two) times daily. 1 Inhaler 3  . dextromethorphan-guaiFENesin (MUCINEX DM) 30-600 MG 12hr tablet Take 1 tablet by mouth 2 (two) times daily as needed for cough. 14 tablet 0  . feeding supplement, ENSURE ENLIVE, (ENSURE ENLIVE) LIQD Take 237 mLs by mouth 3 (three) times daily between meals. 90 Bottle 0  . Macitentan (OPSUMIT) 10 MG TABS Take 1 tablet by mouth daily.    .  naproxen sodium (ANAPROX) 220 MG tablet Take 220 mg by mouth daily as needed (pain).    . predniSONE (DELTASONE) 10 MG tablet 1 tablet by mouth daily 30 tablet 3  . sodium chloride (OCEAN) 0.65 % SOLN nasal spray Place 1 spray into both nostrils as needed for congestion. 480 mL 0   No current facility-administered medications for this visit.     No Known Allergies  Review of Systems  Constitutional: Positive for activity change, appetite change, fatigue and unexpected weight change (lost 21 pounds, then regained 12). Negative for chills and fever.  HENT: Negative for trouble swallowing and voice change.   Eyes: Positive for visual disturbance.  Respiratory: Positive for cough, chest tightness, shortness of breath and wheezing.        Home oxygen  Cardiovascular: Positive for palpitations. Negative for chest pain and leg swelling.  Gastrointestinal: Negative for blood in stool.       Reflux   Genitourinary: Negative for difficulty urinating and dysuria.  Musculoskeletal: Negative for back pain and joint swelling.  Neurological: Negative for seizures, syncope and weakness.  Hematological: Negative for adenopathy. Does not bruise/bleed easily.  All other systems reviewed and are negative.   BP 107/76   Pulse 88   Resp 16   Ht 5' (1.524 m)   Wt 116 lb 6.4 oz (52.8 kg)   LMP 12/28/2015   BMI 22.73 kg/m  Physical Exam  Constitutional: She is oriented to person, place, and time. She appears well-developed and well-nourished.  HENT:  Head: Normocephalic and atraumatic.  Mouth/Throat: No oropharyngeal exudate.  Nasal canula O2 in place  Eyes: Conjunctivae and EOM are normal. No scleral icterus.  Neck: Neck supple. No thyromegaly present.  Pulmonary/Chest: Effort normal. No respiratory distress. She has no wheezes. She has no rales.  Diminished BS bilaterally, + clubbing of nails  Abdominal: Soft. She exhibits no distension. There is no tenderness.  Musculoskeletal: Normal range of  motion. She exhibits no edema.  Lymphadenopathy:    She has no cervical adenopathy.  Neurological: She is alert and oriented to person, place, and time. No cranial nerve deficit. She exhibits normal muscle tone.  No focal motor deficit  Skin: Skin is warm and dry.  Vitals reviewed.    Diagnostic Tests: Pulmonary function tests 12/27/15 FVC= 1.53 (61%) FEV1= 1.21 (60%) FEV1= 1.16 (58%) postbronchodilator DLCO= 3.85 (20%)  Right Heart catheterization 12/12/15 PA= 59/22 (mean 36) PCWP= 5 Rv= 55/1 CVP= 3  CT Chest 11/01/15 CT ANGIOGRAPHY CHEST WITH CONTRAST  TECHNIQUE: Multidetector CT imaging of the chest was performed using the standard   protocol during bolus administration of intravenous contrast. Multiplanar CT image reconstructions and MIPs were obtained to evaluate the vascular anatomy.  CONTRAST:  100 cc Isovue 370 intravenous  COMPARISON:  None.  FINDINGS: Cardiovascular: No cardiomegaly. No pericardial effusion. Enlarged main pulmonary artery measuring up to 31 mm, likely pulmonary hypertension. Study optimized for evaluation of the pulmonary arterial tree. No evidence of pulmonary embolism. Negative thoracic aorta.  Mediastinum: Bulky mediastinal and hilar adenopathy. The subcarinal node is large enough to deform the left atrium. Findings attributed to patient's sarcoidosis history.  Lungs/Pleura: Low volume lungs with apical predominant fibrotic interstitial opacities with central honeycombing. The lungs are diffusely ground-glass in appearance without Kerley lines are septal thickening. There are few patchy airspace type opacities in the right upper and right lower lobes. These could be fibrotic in nature but would correlate for any pneumonia symptoms.  Upper abdomen: No acute findings. Hepatic artery branches are prominent and somewhat tortuous but there is no visible cirrhotic changes.  Musculoskeletal: No chest wall mass or suspicious bone  lesions identified.  Review of the MIP images confirms the above findings.  IMPRESSION: 1. Negative for pulmonary embolism. 2. Thoracic adenopathy and pulmonary fibrosis correlating with sarcoidosis history. Few patchy airspace opacities are likely related to the patient's chronic lung disease, but would correlate for pneumonia symptoms. 3. Probable pulmonary hypertension, presumably related to the fibrosis.   Electronically Signed   By: Monte Fantasia M.D.   On: 11/01/2015 23:27  I personally reviewed the CT chest and concur with the findings noted above  Impression: 50 year old woman with a history of sarcoidosis who presents with progressive shortness of breath now to the point where she is oxygen dependent even at rest. She has severe bilateral airspace disease and hilar or mediastinal adenopathy. She does have a history of ocular sarcoid diagnosed back in 1997. This most likely is severe pulmonary sarcoidosis. She has been started on prednisone and Qvar which have helped as has her home oxygen. She is a biopsy for definitive diagnosis.  There are multiple options for biopsy. She does have enlarged subcarinal nodes that are potentially accessible with mediastinoscopy. However she has a very large right pulmonary artery and pulmonary hypertension and that vessel would be at risk trying to access the subcarinal lymph nodes. If that vessel were injured at the time mediastinal endoscopy is unlikely that that would be a salvageable situation.  2 other potential options would be bronchoscopy and endobronchial ultrasound. Although not ideal for making the diagnosis of sarcoidosis, I do think is possible that we could do so. And if we were able to do so it would save for the risk of a mediastinoscopy. Another potential approach would be a CT-guided needle biopsy from a posterior approach to the subcarinal nodes. I would favor a trial of the bronchoscopy and endobronchial ultrasound  first.  I discussed the proposed procedure with Ms. Furno. He would be done in the operating room under general anesthesia on an outpatient basis. She does understand there is a significant possibility that study will be nondiagnostic. I informed her of the indications, risks, benefits, and alternatives. She understands the risks include, but are not limited those associated with general anesthesia. Procedure specific risks include bleeding, pneumothorax, failure to make a diagnosis, DVT, PE, as well as the possibility of other unforeseeable complications.  Plan: Bronchoscopy and endobronchial ultrasound on Monday, 01/16/2016.  Melrose Nakayama, MD [facility] [phone]

## 2016-01-03 NOTE — Progress Notes (Signed)
Erin Zavala,  My only hesitation is given the increased risk of bleeding with her pulmonary hypertension and transbronchial biopsies. You probably saw from my note that I'm just trying to confirm the Sarcoid before sending her to [facility] for a lung transplant referral evaluation. With the EBUS please make sure the pathologist gets a specimen in RPMI to send for flow cytometry.   Thanks for seeing her.  Creig Hines

## 2016-01-04 ENCOUNTER — Ambulatory Visit (HOSPITAL_COMMUNITY)
Admission: RE | Admit: 2016-01-04 | Discharge: 2016-01-04 | Disposition: A | Discharge status: Home / Self Care | Payer: Medicaid Other | Source: Ambulatory Visit | Attending: Cardiology | Admitting: Cardiology

## 2016-01-04 VITALS — BP 104/66 | HR 85 | Wt 117.8 lb

## 2016-01-04 DIAGNOSIS — D860 Sarcoidosis of lung: Secondary | ICD-10-CM

## 2016-01-04 DIAGNOSIS — I272 Pulmonary hypertension, unspecified: Secondary | ICD-10-CM | POA: Diagnosis not present

## 2016-01-04 DIAGNOSIS — K219 Gastro-esophageal reflux disease without esophagitis: Secondary | ICD-10-CM | POA: Insufficient documentation

## 2016-01-04 DIAGNOSIS — J9611 Chronic respiratory failure with hypoxia: Secondary | ICD-10-CM | POA: Diagnosis not present

## 2016-01-04 DIAGNOSIS — Z7982 Long term (current) use of aspirin: Secondary | ICD-10-CM | POA: Diagnosis not present

## 2016-01-04 DIAGNOSIS — Z87891 Personal history of nicotine dependence: Secondary | ICD-10-CM | POA: Diagnosis not present

## 2016-01-04 DIAGNOSIS — Z9981 Dependence on supplemental oxygen: Secondary | ICD-10-CM | POA: Diagnosis not present

## 2016-01-04 DIAGNOSIS — Z8249 Family history of ischemic heart disease and other diseases of the circulatory system: Secondary | ICD-10-CM | POA: Diagnosis not present

## 2016-01-04 NOTE — Patient Instructions (Signed)
Start Adcirca 20 mg daily, this has to come from a specialty pharmacy, your prescription has been sent to [facility], they will contact you before shipping you the medication  Your physician recommends that you schedule a follow-up appointment in: 1 month

## 2016-01-04 NOTE — Progress Notes (Signed)
6 min walk test completed, pt ambulated 1000 ft (304.8 m) on 3 L O2 sats ranged 83-94%.

## 2016-01-05 NOTE — Progress Notes (Signed)
Pulmonology: Dr. Ashok Cordia Cardiology: Dr. [name]  50 yo with sarcoidosis, chronic hypoxemic respiratory failure on home oxygen, and pulmonary hypertension presents for cardiology evaluation.  She was diagnosed with ocular sarcoidosis in 1997. It seems like she did not have significant lung complications (that were recognized at least) until earlier this year.  She has now requires home oxygen.  CTA chest shows changes consistent with sarcoidosis.  Echo in 8/17 had evidence for RV strain and elevated PA pressure.  [place] in 10/17 confirmed pulmonary arterial hypertension with normal right and left heart filling pressures.    She has started on macitentan.  Prior to starting, she had been having palplitations.  These are now better.  She is short of breath after walking about 5 minutes.  No lightheadedness or syncope. No chest pain.  Stable overall.    Labs (9/17): NA negative, ESR 112, anti-dsDNA negative, anti-CCP negative, RF < 14.  ACE 136.  Labs (10/17): K 4.1, creatinine 0.8  ECG (10/17): NSR, normal  6 minute walk: 305 m.   PMH: 1. GERD 2. Pulmonary hypertension: Suspect Group 5 PH related to sarcoidosis.  - CTA chest (8/17) with no PE but mediastinal/hilar LAN, cystic lung changes, subpleural consolidation, patchy ground glass.  - Echo (8/17): EF 55-60%, D-shaped interventricular septum, PASP 60 mmHg. - ANA negative, ESR 112, anti-dsDNA negative, anti-CCP negative, RF < 14.  - RHC (10/17): mean RA 3, PA 59/22 mean 36, mean PCWP 5, CI 2.92, PVR 7.4 WU.  - PFTs (10/17): Restrictive.  3. Sarcoidosis: Diagnosed with ocular sarcoidosis in 1997. Significant pulmonary involvement noted in 8/17.   4. Chronic hypoxemic respiratory failure: Home oxygen started this year.  5. Childhood asthma.  Social History   Social History  . Marital status: Married    Spouse name: N/A  . Number of children: N/A  . Years of education: N/A   Social History Main Topics  . Smoking status: Former Research scientist (life sciences)  .  Smokeless tobacco: Never Used     Comment: former occasional social smoker  . Alcohol use No  . Drug use: No  . Sexual activity: Not on file   Other Topics Concern  . Not on file   Social History Narrative   Patient denies any bird or mold exposure. Currently works with exposure to inhaled perfumes and chemicals. 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 Previous exposure to inhaled dust while working in [employer]. Denies any IV drug use or previous incarceration. No known tuberculosis exposure. She has never lived nor volunteered at a homeless shelter.   Family History  Problem Relation Age of Onset  . Hypertension Mother   . Hypertension Father   . Rheumatologic disease Neg Hx    ROS: All systems reviewed and negative except as per HPI.   Current Outpatient Prescriptions  Medication Sig Dispense Refill  . aspirin 81 MG tablet Take 1 tablet (81 mg total) by mouth daily. 30 tablet 0  . beclomethasone (QVAR) 80 MCG/ACT inhaler Inhale 2 puffs into the lungs 2 (two) times daily. 1 Inhaler 3  . dextromethorphan-guaiFENesin (MUCINEX DM) 30-600 MG 12hr tablet Take 1 tablet by mouth 2 (two) times daily as needed for cough. 14 tablet 0  . feeding supplement, ENSURE ENLIVE, (ENSURE ENLIVE) LIQD Take 237 mLs by mouth 3 (three) times daily between meals. 90 Bottle 0  . Macitentan (OPSUMIT) 10 MG TABS Take 1 tablet by mouth daily.    . predniSONE (DELTASONE) 10  MG tablet 1 tablet by mouth daily 30 tablet 3  . sodium chloride (OCEAN) 0.65 % SOLN nasal spray Place 1 spray into both nostrils as needed for congestion. 480 mL 0   No current facility-administered medications for this encounter.    BP 104/66   Pulse 85   Wt 117 lb 12 oz (53.4 kg)   LMP 12/28/2015   SpO2 94% Comment: 3L  BMI 23.00 kg/m  General: Thin, NAD Neck: No JVD, no thyromegaly or thyroid nodule.  Lungs: Clear to auscultation bilaterally with normal  respiratory effort. CV: Nondisplaced PMI.  Heart regular S1/S2 (loud P2), no S3/S4, no murmur.  No peripheral edema.  No carotid bruit.  Normal pedal pulses.  Abdomen: Soft, nontender, no hepatosplenomegaly, no distention.  Skin: Intact without lesions or rashes.  Neurologic: Alert and oriented x 3.  Psych: Normal affect. Extremities: No clubbing or cyanosis.  HEENT: Normal.   Assessment/Plan: 1. Sarcoidosis: Followed by Dr. Ashok Cordia.  Ocular and lung involvement.  She is on prednisone and home oxygen.  2. Pulmonary hypertension: Pulmonary arterial hypertension. Suspect Group 5 PH related to sarcoidosis. CTA chest showed no PE but did show changes of sarcoidosis.  PFTs were restrictive.  Echo showed RV strain and elevated PA pressure.  PAH was confirmed by RHC with PVR 7.4 WU.  Serologic workup was negative.  - 6 minute walk done today => she did well.  - Continue macitentan.  Will start tadalafil 20 mg daily.    Loralie Champagne 01/05/2016

## 2016-01-10 NOTE — Pre-Procedure Instructions (Signed)
Erin Zavala  01/10/2016      [id number] [address]Phone: [phone]: [phone] [facility] [facility] [address]Phone: [phone]: [phone] [address]Phone: [phone]: [phone] [id number] [address]hone: [phone]: [phone]  Your procedure is scheduled on November 6  Report to [facility] [address].  Call this number if you have problems the morning of surgery:  [phone]   Remember:  Do not eat food or drink liquids after midnight.   Take these medicines the morning of surgery with A SIP OF WATER  beclomethasone (QVAR), predniSONE (DELTASONE),   Macitentan (OPSUMIT)  7 days prior to surgery STOP taking any Aspirin, Aleve, Naproxen, Ibuprofen, Motrin, Advil, Goody's, BC's, all herbal medications, fish oil, and all vitamins    Do not wear jewelry, make-up or nail polish.  Do not wear lotions, powders, or perfumes, or deoderant.  Do not shave 48 hours prior to surgery.    Do not bring valuables to the hospital.  [facility] not responsible for any belongings or valuables.  Contacts, dentures or bridgework may not be worn into surgery.  Leave your suitcase in the car.  After surgery it may be brought to your room.  For patients admitted to the hospital, discharge time will be determined by your treatment team.  Patients discharged the day of surgery will not be allowed to drive home.    Special instructions:   [place]- Preparing For Surgery  Before surgery, you can play an important role. Because skin is not sterile, your skin needs to be as free of germs as possible.  You can reduce the number of germs on your skin by washing with CHG (chlorahexidine gluconate) Soap before surgery.  CHG is an antiseptic cleaner which kills germs and bonds with the skin to continue killing germs even after washing.  Please do not use if you have an allergy to CHG or antibacterial soaps. If your skin becomes reddened/irritated stop using the CHG.  Do not shave (including legs and underarms) for at least 48 hours prior to first CHG shower. It is OK to shave your face.  Please follow these instructions carefully.   1. Shower the NIGHT BEFORE SURGERY and the MORNING OF SURGERY with CHG.   2. If you chose to wash your hair, wash your hair first as usual with your normal shampoo.  3. After you shampoo, rinse your hair and body thoroughly to remove the shampoo.  4. Use CHG as you would any other liquid soap. You can apply CHG directly to the skin and wash gently with a scrungie or a clean washcloth.   5. Apply the CHG Soap to your body ONLY FROM THE NECK DOWN.  Do not use on open wounds or open sores. Avoid contact with your eyes, ears, mouth and genitals (private parts). Wash genitals (private parts) with your normal soap.  6. Wash thoroughly, paying special attention to the area where your surgery will be performed.  7. Thoroughly rinse your body with warm water from the neck down.  8. DO NOT shower/wash with your normal soap after using and  rinsing off the CHG Soap.  9. Pat yourself dry with a CLEAN TOWEL.   10. Wear CLEAN PAJAMAS   11. Place CLEAN SHEETS on your bed the night of your first shower and DO NOT SLEEP WITH PETS.    Day of Surgery: Do not apply any deodorants/lotions. Please wear clean clothes to the hospital/surgery center.      Please read over the following fact sheets that you were given.

## 2016-01-11 ENCOUNTER — Encounter (HOSPITAL_COMMUNITY)
Admission: RE | Admit: 2016-01-11 | Discharge: 2016-01-11 | Disposition: A | Discharge status: Home / Self Care | Payer: Medicaid Other | Source: Ambulatory Visit | Attending: Thoracic Surgery (Cardiothoracic Vascular Surgery) | Admitting: Thoracic Surgery (Cardiothoracic Vascular Surgery)

## 2016-01-11 ENCOUNTER — Encounter (HOSPITAL_COMMUNITY): Payer: Self-pay

## 2016-01-11 DIAGNOSIS — R590 Localized enlarged lymph nodes: Secondary | ICD-10-CM

## 2016-01-11 HISTORY — DX: Dyspnea, unspecified: R06.00

## 2016-01-11 HISTORY — DX: Cough: R05

## 2016-01-11 HISTORY — DX: Dependence on supplemental oxygen: Z99.81

## 2016-01-11 HISTORY — DX: Pulmonary hypertension, unspecified: I27.20

## 2016-01-11 HISTORY — DX: Cough, unspecified: R05.9

## 2016-01-11 HISTORY — DX: Unspecified asthma, uncomplicated: J45.909

## 2016-01-11 LAB — COMPREHENSIVE METABOLIC PANEL
ALBUMIN: 3.4 g/dL — AB (ref 3.5–5.0)
ALT: 21 U/L (ref 14–54)
ANION GAP: 7 (ref 5–15)
AST: 23 U/L (ref 15–41)
Alkaline Phosphatase: 64 U/L (ref 38–126)
BILIRUBIN TOTAL: 0.5 mg/dL (ref 0.3–1.2)
BUN: 14 mg/dL (ref 6–20)
CHLORIDE: 106 mmol/L (ref 101–111)
CO2: 25 mmol/L (ref 22–32)
Calcium: 9.5 mg/dL (ref 8.9–10.3)
Creatinine, Ser: 0.91 mg/dL (ref 0.44–1.00)
GFR calc Af Amer: >60 mL/min (ref >60)
GLUCOSE: 103 mg/dL — AB (ref 65–99)
POTASSIUM: 3.8 mmol/L (ref 3.5–5.1)
Sodium: 138 mmol/L (ref 135–145)
TOTAL PROTEIN: 7 g/dL (ref 6.5–8.1)

## 2016-01-11 LAB — CBC
HEMATOCRIT: 39.4 % (ref 36.0–46.0)
Hemoglobin: 13 g/dL (ref 12.0–15.0)
MCH: 28.8 pg (ref 26.0–34.0)
MCHC: 33 g/dL (ref 30.0–36.0)
MCV: 87.4 fL (ref 78.0–100.0)
PLATELETS: 352 10*3/uL (ref 150–400)
RBC: 4.51 MIL/uL (ref 3.87–5.11)
RDW: 15.3 % (ref 11.5–15.5)
WBC: 14.5 10*3/uL — AB (ref 4.0–10.5)

## 2016-01-11 LAB — APTT: APTT: 29 s (ref 24–36)

## 2016-01-11 LAB — PROTIME-INR
INR: 0.99
PROTHROMBIN TIME: 13.1 s (ref 11.4–15.2)

## 2016-01-11 LAB — HCG, SERUM, QUALITATIVE: PREG SERUM: NEGATIVE

## 2016-01-11 NOTE — Progress Notes (Addendum)
PCP - none Cardiologist - [place]  Chest x-ray - DOS EKG - 12/12/15 Stress Test - denies ECHO - 11/02/15 Cardiac Cath - 12/12/15  Will send to anesthesia for review of Ekg   Patient is on 2l of oxygen at all times and on 3L with acitivity    Patient denies shortness of breath, fever, cough and chest pain at PAT appointment

## 2016-01-12 ENCOUNTER — Other Ambulatory Visit: Payer: Self-pay | Admitting: *Deleted

## 2016-01-12 ENCOUNTER — Other Ambulatory Visit (HOSPITAL_COMMUNITY): Payer: Self-pay | Admitting: Pharmacist

## 2016-01-12 DIAGNOSIS — R590 Localized enlarged lymph nodes: Secondary | ICD-10-CM

## 2016-01-12 MED ORDER — TADALAFIL (PAH) 20 MG PO TABS: 20.0000 mg | ORAL_TABLET | Freq: Every day | ORAL | 11 refills | Status: DC

## 2016-01-12 NOTE — Progress Notes (Addendum)
Anesthesia Chart Review: Patient is a 50 year old female scheduled for video bronchoscopy with endobronchial ultrasound on 01/16/2016 by Dr. Roxan Hockey. Diagnosis: mediastinal adenopathy.  History includes former smoker (quit '03), Sarcoidosis (occular sarcoidosis '97; presented with pulmonary sarcoidosis 10/2015), moderate-severe pulmonary hypertension likely secondary to sarcoidosis (on Opsumit and Adcirca), chornic hypoxemic respiratory failure on home O2 (2L; 3L with activity), dyspnea, childhood asthma, GERD, migraines, tubal ligation,   - No PCP per patient. - Cardiologist is Dr. [name], last visit in the Pulmonary HTN clinic on 01/04/16. Opsumit was started after RHC, macitentan was started at this visit.  - Pulmonologist is Dr. Milinda Hirschfeld. He has been in communication with Dr. Roxan Hockey (see 01/02/16 Progress note). Following definitive diagnosis, he may ultimately refer her to [facility] for consideration for lung transplant evaluation.   Meds include ASA 81 mg, Qvar, prednisone, tadalafil (Adcirca), macitentan (Opsumit).  BP 114/75   Pulse 91   Temp 36.6 C   Resp 18   Ht 5' (1.524 m)   Wt 121 lb 8 oz (55.1 kg)   LMP 12/28/2015   SpO2 93%   BMI 23.73 kg/m    EKG 12/12/15  NSR, non-specific ST-T changes.  [place] 12/12/15 (Dr. [name]): RA Mean - 3 RV - 55/1 PA - 59/22 (36) PCWP - 5 PA Sat - 75% Aortic Sat - 100% CO - 4.2 CI - 2.92 PVR - 7.4 WU 1. Normal (low) left and right heart filling pressures.  2. Moderate pulmonary arterial HTN, PVR 7.4 WU.  Suspect component of group 5 PH (sarcoidosis) but cannot rule out group 1 component (?lupus with elevated ANA).  She is symptomatic, would be reasonable to begin treatment with ERA (in setting of sarcoidosis). Will discuss with Dr Ashok Cordia.   [place] 12/05/15   Sinus rhythm with PVCs & PACs. HR 70-160. No pauses.  TTE 11/02/15 LV normal in size with EF 55-60%.No wall motion abnormalities. Grade 1 diastolic dysfunction. LA  &RA normal in size. RV normal in size and function. Pulmonary artery systolic pressure 60 mmHg. Diastolic flattening and systolic flattening of the intraventricular septum. No aortic stenosis or regurgitation. Aortic root normal in size. Trivial mitral regurgitation without stenosis. No pulmonic stenosis. Mild tricuspid regurgitation. Trivial pericardial effusion.  Pulmonary function tests 12/27/15 FVC= 1.53 (61%) FEV1= 1.21 (60%) FEV1= 1.16 (58%) postbronchodilator DLCO= 3.85 (20%)  CTA Chest 11/01/15 IMPRESSION: 1. Negative for pulmonary embolism. 2. Thoracic adenopathy and pulmonary fibrosis correlating with sarcoidosis history. Few patchy airspace opacities are likely related to the patient's chronic lung disease, but would correlate for pneumonia symptoms. 3. Probable pulmonary hypertension, presumably related to the fibrosis.  6 min walk test 01/04/16  Patient ambulated 1000 ft (304.8 m) on 3 L O2 sats ranged 83-94%.  She is scheduled for a CXR on the day of surgery.   Preoperative labs noted. Cr 0.91. Glucose 103. WBC 14.5, H/H 13.0/39.4, PLT 352. PT/PTT WNL. Serum pregnancy test negative. Due to elevated WBC, Dr. Roxan Hockey is having her come back in for a UA and repeat CBC on 01/13/16. Chart will be left for follow-up labs. I have updated anesthesiologist Dr. Conrad Truxton regarding patient's pulmonary hypertension history.  [facility]/Anesthesiology Phone [phone] 01/12/2016 11:16 AM  Addendum: Follow-up labs today showed her WBC trending down to 12.5. UA WNL. CXR showed no acute cardiopulmonary findings, diffuse interstitial lung disease which is an expected finding.   Anesthesiologist to evaluate on the day of surgery.  Myra Gianotti, PA-C [facility]  Short Stay Center/Anesthesiology Phone [phone] 01/13/2016 3:58 PM

## 2016-01-13 ENCOUNTER — Ambulatory Visit (HOSPITAL_COMMUNITY)
Admission: RE | Admit: 2016-01-13 | Discharge: 2016-01-13 | Disposition: A | Discharge status: Home / Self Care | Payer: Medicaid Other | Source: Ambulatory Visit | Attending: Anesthesiology | Admitting: Anesthesiology

## 2016-01-13 ENCOUNTER — Encounter (HOSPITAL_COMMUNITY)
Admission: RE | Admit: 2016-01-13 | Discharge: 2016-01-13 | Disposition: A | Discharge status: Home / Self Care | Payer: Medicaid Other | Source: Ambulatory Visit | Attending: Thoracic Surgery (Cardiothoracic Vascular Surgery) | Admitting: Thoracic Surgery (Cardiothoracic Vascular Surgery)

## 2016-01-13 DIAGNOSIS — J849 Interstitial pulmonary disease, unspecified: Secondary | ICD-10-CM | POA: Insufficient documentation

## 2016-01-13 DIAGNOSIS — Z01818 Encounter for other preprocedural examination: Secondary | ICD-10-CM | POA: Insufficient documentation

## 2016-01-13 DIAGNOSIS — R590 Localized enlarged lymph nodes: Secondary | ICD-10-CM | POA: Diagnosis present

## 2016-01-13 LAB — URINALYSIS, ROUTINE W REFLEX MICROSCOPIC
Bilirubin Urine: NEGATIVE
GLUCOSE, UA: NEGATIVE mg/dL
HGB URINE DIPSTICK: NEGATIVE
KETONES UR: NEGATIVE mg/dL
LEUKOCYTES UA: NEGATIVE
Nitrite: NEGATIVE
PROTEIN: NEGATIVE mg/dL
Specific Gravity, Urine: 1.025 (ref 1.005–1.030)
pH: 6 (ref 5.0–8.0)

## 2016-01-13 LAB — CBC
HCT: 43.2 % (ref 36.0–46.0)
Hemoglobin: 14.4 g/dL (ref 12.0–15.0)
MCH: 29 pg (ref 26.0–34.0)
MCHC: 33.3 g/dL (ref 30.0–36.0)
MCV: 87.1 fL (ref 78.0–100.0)
PLATELETS: 397 10*3/uL (ref 150–400)
RBC: 4.96 MIL/uL (ref 3.87–5.11)
RDW: 15.4 % (ref 11.5–15.5)
WBC: 12.5 10*3/uL — AB (ref 4.0–10.5)

## 2016-01-16 ENCOUNTER — Encounter (HOSPITAL_COMMUNITY)
Admission: RE | Disposition: A | Discharge status: Home / Self Care | Payer: Self-pay | Source: Ambulatory Visit | Attending: Thoracic Surgery (Cardiothoracic Vascular Surgery)

## 2016-01-16 ENCOUNTER — Ambulatory Visit (HOSPITAL_COMMUNITY)
Admission: RE | Admit: 2016-01-16 | Discharge: 2016-01-16 | Disposition: A | Discharge status: Home / Self Care | Payer: Medicaid Other | Source: Ambulatory Visit | Attending: Thoracic Surgery (Cardiothoracic Vascular Surgery) | Admitting: Thoracic Surgery (Cardiothoracic Vascular Surgery)

## 2016-01-16 ENCOUNTER — Ambulatory Visit (HOSPITAL_COMMUNITY): Payer: Medicaid Other | Admitting: Vascular Surgery

## 2016-01-16 ENCOUNTER — Ambulatory Visit (HOSPITAL_COMMUNITY): Payer: Medicaid Other

## 2016-01-16 ENCOUNTER — Encounter (HOSPITAL_COMMUNITY): Payer: Self-pay | Admitting: General Practice

## 2016-01-16 ENCOUNTER — Ambulatory Visit (HOSPITAL_COMMUNITY): Payer: Medicaid Other | Admitting: Certified Registered"

## 2016-01-16 ENCOUNTER — Other Ambulatory Visit (HOSPITAL_COMMUNITY): Payer: Self-pay

## 2016-01-16 DIAGNOSIS — R0602 Shortness of breath: Secondary | ICD-10-CM | POA: Diagnosis not present

## 2016-01-16 DIAGNOSIS — Z9981 Dependence on supplemental oxygen: Secondary | ICD-10-CM | POA: Diagnosis not present

## 2016-01-16 DIAGNOSIS — R05 Cough: Secondary | ICD-10-CM | POA: Diagnosis not present

## 2016-01-16 DIAGNOSIS — J8410 Pulmonary fibrosis, unspecified: Secondary | ICD-10-CM | POA: Insufficient documentation

## 2016-01-16 DIAGNOSIS — G43909 Migraine, unspecified, not intractable, without status migrainosus: Secondary | ICD-10-CM | POA: Diagnosis not present

## 2016-01-16 DIAGNOSIS — Z8249 Family history of ischemic heart disease and other diseases of the circulatory system: Secondary | ICD-10-CM | POA: Insufficient documentation

## 2016-01-16 DIAGNOSIS — K219 Gastro-esophageal reflux disease without esophagitis: Secondary | ICD-10-CM | POA: Insufficient documentation

## 2016-01-16 DIAGNOSIS — R590 Localized enlarged lymph nodes: Secondary | ICD-10-CM | POA: Diagnosis not present

## 2016-01-16 DIAGNOSIS — Z9889 Other specified postprocedural states: Secondary | ICD-10-CM

## 2016-01-16 DIAGNOSIS — D8689 Sarcoidosis of other sites: Secondary | ICD-10-CM | POA: Diagnosis not present

## 2016-01-16 DIAGNOSIS — I272 Pulmonary hypertension, unspecified: Secondary | ICD-10-CM | POA: Insufficient documentation

## 2016-01-16 DIAGNOSIS — Z7982 Long term (current) use of aspirin: Secondary | ICD-10-CM | POA: Insufficient documentation

## 2016-01-16 DIAGNOSIS — J45909 Unspecified asthma, uncomplicated: Secondary | ICD-10-CM | POA: Diagnosis not present

## 2016-01-16 DIAGNOSIS — Z87891 Personal history of nicotine dependence: Secondary | ICD-10-CM | POA: Diagnosis not present

## 2016-01-16 DIAGNOSIS — J849 Interstitial pulmonary disease, unspecified: Secondary | ICD-10-CM | POA: Diagnosis not present

## 2016-01-16 HISTORY — PX: VIDEO BRONCHOSCOPY WITH ENDOBRONCHIAL ULTRASOUND: SHX6177

## 2016-01-16 SURGERY — BRONCHOSCOPY, WITH EBUS
Anesthesia: General

## 2016-01-16 MED ORDER — MIDAZOLAM HCL 2 MG/2ML IJ SOLN
INTRAMUSCULAR | Status: AC
  Filled 2016-01-16: qty 2

## 2016-01-16 MED ORDER — PHENYLEPHRINE HCL 10 MG/ML IJ SOLN
INTRAVENOUS | Status: DC | PRN
  Administered 2016-01-16: 30 ug/min via INTRAVENOUS

## 2016-01-16 MED ORDER — EPINEPHRINE PF 1 MG/ML IJ SOLN
INTRAMUSCULAR | Status: DC | PRN
  Administered 2016-01-16: 1 mg via ENDOTRACHEOPULMONARY

## 2016-01-16 MED ORDER — LIDOCAINE HCL (CARDIAC) 20 MG/ML IV SOLN
INTRAVENOUS | Status: DC | PRN
  Administered 2016-01-16: 100 mg via INTRATRACHEAL

## 2016-01-16 MED ORDER — FENTANYL CITRATE (PF) 100 MCG/2ML IJ SOLN
INTRAMUSCULAR | Status: AC
  Filled 2016-01-16: qty 2

## 2016-01-16 MED ORDER — ROCURONIUM BROMIDE 10 MG/ML (PF) SYRINGE
PREFILLED_SYRINGE | INTRAVENOUS | Status: AC
  Filled 2016-01-16: qty 10

## 2016-01-16 MED ORDER — 0.9 % SODIUM CHLORIDE (POUR BTL) OPTIME
TOPICAL | Status: DC | PRN
  Administered 2016-01-16: 1000 mL

## 2016-01-16 MED ORDER — HYDROMORPHONE HCL 1 MG/ML IJ SOLN: 0.2500 mg | INTRAMUSCULAR | Status: DC | PRN

## 2016-01-16 MED ORDER — SUGAMMADEX SODIUM 200 MG/2ML IV SOLN
INTRAVENOUS | Status: DC | PRN
  Administered 2016-01-16: 120 mg via INTRAVENOUS

## 2016-01-16 MED ORDER — ONDANSETRON HCL 4 MG/2ML IJ SOLN
INTRAMUSCULAR | Status: DC | PRN
  Administered 2016-01-16: 4 mg via INTRAVENOUS

## 2016-01-16 MED ORDER — MIDAZOLAM HCL 2 MG/2ML IJ SOLN
INTRAMUSCULAR | Status: DC | PRN
  Administered 2016-01-16: 2 mg via INTRAVENOUS

## 2016-01-16 MED ORDER — FENTANYL CITRATE (PF) 100 MCG/2ML IJ SOLN
INTRAMUSCULAR | Status: DC | PRN
  Administered 2016-01-16: 100 ug via INTRAVENOUS

## 2016-01-16 MED ORDER — LIDOCAINE 2% (20 MG/ML) 5 ML SYRINGE
INTRAMUSCULAR | Status: AC
  Filled 2016-01-16: qty 5

## 2016-01-16 MED ORDER — ONDANSETRON HCL 4 MG/2ML IJ SOLN: 4.0000 mg | Freq: Once | INTRAMUSCULAR | Status: DC | PRN

## 2016-01-16 MED ORDER — PHENYLEPHRINE 40 MCG/ML (10ML) SYRINGE FOR IV PUSH (FOR BLOOD PRESSURE SUPPORT)
PREFILLED_SYRINGE | INTRAVENOUS | Status: AC
  Filled 2016-01-16: qty 10

## 2016-01-16 MED ORDER — SUGAMMADEX SODIUM 200 MG/2ML IV SOLN
INTRAVENOUS | Status: AC
  Filled 2016-01-16: qty 2

## 2016-01-16 MED ORDER — ONDANSETRON HCL 4 MG/2ML IJ SOLN
INTRAMUSCULAR | Status: AC
  Filled 2016-01-16: qty 2

## 2016-01-16 MED ORDER — PROPOFOL 10 MG/ML IV BOLUS
INTRAVENOUS | Status: AC
  Filled 2016-01-16: qty 20

## 2016-01-16 MED ORDER — ROCURONIUM 10MG/ML (10ML) SYRINGE FOR MEDFUSION PUMP - OPTIME
INTRAVENOUS | Status: DC | PRN
  Administered 2016-01-16: 50 mg via INTRAVENOUS

## 2016-01-16 MED ORDER — LACTATED RINGERS IV SOLN
INTRAVENOUS | Status: DC | PRN
Start: 2016-01-16 — End: 2016-01-16
  Administered 2016-01-16 (×2): via INTRAVENOUS

## 2016-01-16 MED ORDER — EPINEPHRINE PF 1 MG/ML IJ SOLN
INTRAMUSCULAR | Status: AC
  Filled 2016-01-16: qty 1

## 2016-01-16 MED ORDER — PROPOFOL 10 MG/ML IV BOLUS
INTRAVENOUS | Status: DC | PRN
  Administered 2016-01-16: 100 mg via INTRAVENOUS

## 2016-01-16 MED ORDER — PHENYLEPHRINE HCL 10 MG/ML IJ SOLN
INTRAMUSCULAR | Status: DC | PRN
  Administered 2016-01-16: 120 ug via INTRAVENOUS

## 2016-01-16 MED ORDER — MEPERIDINE HCL 25 MG/ML IJ SOLN: 6.2500 mg | INTRAMUSCULAR | Status: DC | PRN

## 2016-01-16 SURGICAL SUPPLY — 31 items
BRUSH CYTOL CELLEBRITY 1.5X140 (MISCELLANEOUS) ×3 IMPLANT
CANISTER SUCTION 2500CC (MISCELLANEOUS) ×3 IMPLANT
CONT SPEC 4OZ CLIKSEAL STRL BL (MISCELLANEOUS) ×9 IMPLANT
COVER DOME SNAP 22 D (MISCELLANEOUS) ×3 IMPLANT
COVER TABLE BACK 60X90 (DRAPES) ×3 IMPLANT
FILTER STRAW FLUID ASPIR (MISCELLANEOUS) IMPLANT
FORCEPS BIOP RJ4 1.8 (CUTTING FORCEPS) ×3 IMPLANT
GAUZE SPONGE 4X4 12PLY STRL (GAUZE/BANDAGES/DRESSINGS) ×3 IMPLANT
GLOVE BIO SURGEON STRL SZ 6.5 (GLOVE) ×2 IMPLANT
GLOVE BIO SURGEONS STRL SZ 6.5 (GLOVE) ×1
GLOVE SURG SIGNA 7.5 PF LTX (GLOVE) ×3 IMPLANT
GOWN STRL REUS W/ TWL XL LVL3 (GOWN DISPOSABLE) ×1 IMPLANT
GOWN STRL REUS W/TWL XL LVL3 (GOWN DISPOSABLE) ×2
KIT CLEAN ENDO COMPLIANCE (KITS) ×6 IMPLANT
KIT ROOM TURNOVER OR (KITS) ×3 IMPLANT
MARKER SKIN DUAL TIP RULER LAB (MISCELLANEOUS) ×3 IMPLANT
NEEDLE BIOPSY TRANSBRONCH 21G (NEEDLE) ×3 IMPLANT
NEEDLE EBUS SONO TIP PENTAX (NEEDLE) ×3 IMPLANT
NS IRRIG 1000ML POUR BTL (IV SOLUTION) ×3 IMPLANT
OIL SILICONE PENTAX (PARTS (SERVICE/REPAIRS)) ×3 IMPLANT
PAD ARMBOARD 7.5X6 YLW CONV (MISCELLANEOUS) ×6 IMPLANT
SYR 20CC LL (SYRINGE) ×3 IMPLANT
SYR 20ML ECCENTRIC (SYRINGE) ×3 IMPLANT
SYR 5ML LL (SYRINGE) ×3 IMPLANT
SYR 5ML LUER SLIP (SYRINGE) ×3 IMPLANT
SYR CONTROL 10ML LL (SYRINGE) IMPLANT
TOWEL OR 17X24 6PK STRL BLUE (TOWEL DISPOSABLE) ×3 IMPLANT
TRAP SPECIMEN MUCOUS 40CC (MISCELLANEOUS) ×3 IMPLANT
TUBE CONNECTING 20'X1/4 (TUBING) ×1
TUBE CONNECTING 20X1/4 (TUBING) ×2 IMPLANT
WATER STERILE IRR 1000ML POUR (IV SOLUTION) ×3 IMPLANT

## 2016-01-16 NOTE — H&P (View-Only) (Signed)
PCP is No PCP Per Patient Referring Provider is Javier Glazier, MD  Chief Complaint  Patient presents with  . Adenopathy    mediastinal/hilar....referral for BX.Marland KitchenMarland KitchenCTA CHEST 11/01/15, PFT 12/1715, ECHO 11/02/15    HPI:  Erin Zavala is a 49 year old woman sent for consultation regarding mediastinal adenopathy.  Erin Zavala is a 50 year old with a history of ocular sarcoidosis, gastroesophageal reflux and asthma. She was diagnosed with ocular sarcoidosis in 1997. She was treated for a few years and did not have any further issues with that. Apparently she had some scarring in her lungs but no respiratory symptoms. Over the past year she has been feeling short of breath and having a chronic cough. Cough is generally nonproductive but occasionally has some white sputum. She initially was only short of breath with heavy exertion. However over the past few months it has been rapidly progressive and she is short of breath even with minimal exertion such as walking across the room. She progressed to the point where she was short of breath at rest. She was admitted to the hospital on 11/01/2015. She had a CT of the chest to rule out a pulmonary embolus. There was no PE, but there was severe diffuse airspace disease and hilar and mediastinal adenopathy.  She saw Dr. Ashok Cordia and was started on steroids. Right heart catheterization showed pulmonary hypertension with a systolic of 59 mmHg. Her CVP was normal and she had no evidence of right heart failure. Point function testing showed moderate decrease in FVC and FEV1 both the proximal 60% of predicted. There was a severe decrease in her diffusion capacity at 20% of predicted.  She currently is on 2 L nasal cannula oxygen at rest and 3 L with exertion. She typically is not short of breath at rest without oxygen. She does get short of breath if she tries to walk up stairs. She lost about 21 pounds over the past 6 months but is gaining back about 12 or 14 pounds over  the past 2 months. She previously smoked, but quit in 2003.  Zubrod Score: At the time of surgery this patient's most appropriate activity status/level should be described as: _0     0    Normal activity, no symptoms _1     1    Restricted in physical strenuous activity but ambulatory, able to do out light work _2     2    Ambulatory and capable of self care, unable to do work activities, up and about >50 % of waking hours                              _3     3    Only limited self care, in bed greater than 50% of waking hours _4     4    Completely disabled, no self care, confined to bed or chair _5     5    Moribund  Past Medical History:  Diagnosis Date  . GERD (gastroesophageal reflux disease)   . Migraines   . Sarcoidosis [facility])     Past Surgical History:  Procedure Laterality Date  . CARDIAC CATHETERIZATION N/A 12/12/2015   Procedure: Right Heart Cath;  Surgeon: Larey Dresser, MD;  Location: [facility] [facility];  Service: Cardiovascular;  Laterality: N/A;  . tuballigation      Family History  Problem Relation Age of Onset  . Hypertension Mother   . Hypertension Father   . Rheumatologic  disease Neg Hx     Social History Social History  Substance Use Topics  . Smoking status: Former Research scientist (life sciences)  . Smokeless tobacco: Never Used     Comment: former occasional social smoker  . Alcohol use No    Current Outpatient Prescriptions  Medication Sig Dispense Refill  . aspirin 81 MG tablet Take 1 tablet (81 mg total) by mouth daily. 30 tablet 0  . beclomethasone (QVAR) 80 MCG/ACT inhaler Inhale 2 puffs into the lungs 2 (two) times daily. 1 Inhaler 3  . dextromethorphan-guaiFENesin (MUCINEX DM) 30-600 MG 12hr tablet Take 1 tablet by mouth 2 (two) times daily as needed for cough. 14 tablet 0  . feeding supplement, ENSURE ENLIVE, (ENSURE ENLIVE) LIQD Take 237 mLs by mouth 3 (three) times daily between meals. 90 Bottle 0  . Macitentan (OPSUMIT) 10 MG TABS Take 1 tablet by mouth daily.    .  naproxen sodium (ANAPROX) 220 MG tablet Take 220 mg by mouth daily as needed (pain).    . predniSONE (DELTASONE) 10 MG tablet 1 tablet by mouth daily 30 tablet 3  . sodium chloride (OCEAN) 0.65 % SOLN nasal spray Place 1 spray into both nostrils as needed for congestion. 480 mL 0   No current facility-administered medications for this visit.     No Known Allergies  Review of Systems  Constitutional: Positive for activity change, appetite change, fatigue and unexpected weight change (lost 21 pounds, then regained 12). Negative for chills and fever.  HENT: Negative for trouble swallowing and voice change.   Eyes: Positive for visual disturbance.  Respiratory: Positive for cough, chest tightness, shortness of breath and wheezing.        Home oxygen  Cardiovascular: Positive for palpitations. Negative for chest pain and leg swelling.  Gastrointestinal: Negative for blood in stool.       Reflux   Genitourinary: Negative for difficulty urinating and dysuria.  Musculoskeletal: Negative for back pain and joint swelling.  Neurological: Negative for seizures, syncope and weakness.  Hematological: Negative for adenopathy. Does not bruise/bleed easily.  All other systems reviewed and are negative.   BP 107/76   Pulse 88   Resp 16   Ht 5' (1.524 m)   Wt 116 lb 6.4 oz (52.8 kg)   LMP 12/28/2015   BMI 22.73 kg/m  Physical Exam  Constitutional: She is oriented to person, place, and time. She appears well-developed and well-nourished.  HENT:  Head: Normocephalic and atraumatic.  Mouth/Throat: No oropharyngeal exudate.  Nasal canula O2 in place  Eyes: Conjunctivae and EOM are normal. No scleral icterus.  Neck: Neck supple. No thyromegaly present.  Pulmonary/Chest: Effort normal. No respiratory distress. She has no wheezes. She has no rales.  Diminished BS bilaterally, + clubbing of nails  Abdominal: Soft. She exhibits no distension. There is no tenderness.  Musculoskeletal: Normal range of  motion. She exhibits no edema.  Lymphadenopathy:    She has no cervical adenopathy.  Neurological: She is alert and oriented to person, place, and time. No cranial nerve deficit. She exhibits normal muscle tone.  No focal motor deficit  Skin: Skin is warm and dry.  Vitals reviewed.    Diagnostic Tests: Pulmonary function tests 12/27/15 FVC= 1.53 (61%) FEV1= 1.21 (60%) FEV1= 1.16 (58%) postbronchodilator DLCO= 3.85 (20%)  Right Heart catheterization 12/12/15 PA= 59/22 (mean 36) PCWP= 5 Rv= 55/1 CVP= 3  CT Chest 11/01/15 CT ANGIOGRAPHY CHEST WITH CONTRAST  TECHNIQUE: Multidetector CT imaging of the chest was performed using the standard  protocol during bolus administration of intravenous contrast. Multiplanar CT image reconstructions and MIPs were obtained to evaluate the vascular anatomy.  CONTRAST:  100 cc Isovue 370 intravenous  COMPARISON:  None.  FINDINGS: Cardiovascular: No cardiomegaly. No pericardial effusion. Enlarged main pulmonary artery measuring up to 31 mm, likely pulmonary hypertension. Study optimized for evaluation of the pulmonary arterial tree. No evidence of pulmonary embolism. Negative thoracic aorta.  Mediastinum: Bulky mediastinal and hilar adenopathy. The subcarinal node is large enough to deform the left atrium. Findings attributed to patient's sarcoidosis history.  Lungs/Pleura: Low volume lungs with apical predominant fibrotic interstitial opacities with central honeycombing. The lungs are diffusely ground-glass in appearance without Kerley lines are septal thickening. There are few patchy airspace type opacities in the right upper and right lower lobes. These could be fibrotic in nature but would correlate for any pneumonia symptoms.  Upper abdomen: No acute findings. Hepatic artery branches are prominent and somewhat tortuous but there is no visible cirrhotic changes.  Musculoskeletal: No chest wall mass or suspicious bone  lesions identified.  Review of the MIP images confirms the above findings.  IMPRESSION: 1. Negative for pulmonary embolism. 2. Thoracic adenopathy and pulmonary fibrosis correlating with sarcoidosis history. Few patchy airspace opacities are likely related to the patient's chronic lung disease, but would correlate for pneumonia symptoms. 3. Probable pulmonary hypertension, presumably related to the fibrosis.   Electronically Signed   By: Monte Fantasia M.D.   On: 11/01/2015 23:27  I personally reviewed the CT chest and concur with the findings noted above  Impression: 50 year old woman with a history of sarcoidosis who presents with progressive shortness of breath now to the point where she is oxygen dependent even at rest. She has severe bilateral airspace disease and hilar or mediastinal adenopathy. She does have a history of ocular sarcoid diagnosed back in 1997. This most likely is severe pulmonary sarcoidosis. She has been started on prednisone and Qvar which have helped as has her home oxygen. She is a biopsy for definitive diagnosis.  There are multiple options for biopsy. She does have enlarged subcarinal nodes that are potentially accessible with mediastinoscopy. However she has a very large right pulmonary artery and pulmonary hypertension and that vessel would be at risk trying to access the subcarinal lymph nodes. If that vessel were injured at the time mediastinal endoscopy is unlikely that that would be a salvageable situation.  2 other potential options would be bronchoscopy and endobronchial ultrasound. Although not ideal for making the diagnosis of sarcoidosis, I do think is possible that we could do so. And if we were able to do so it would save for the risk of a mediastinoscopy. Another potential approach would be a CT-guided needle biopsy from a posterior approach to the subcarinal nodes. I would favor a trial of the bronchoscopy and endobronchial ultrasound  first.  I discussed the proposed procedure with Ms. Holsworth. He would be done in the operating room under general anesthesia on an outpatient basis. She does understand there is a significant possibility that study will be nondiagnostic. I informed her of the indications, risks, benefits, and alternatives. She understands the risks include, but are not limited those associated with general anesthesia. Procedure specific risks include bleeding, pneumothorax, failure to make a diagnosis, DVT, PE, as well as the possibility of other unforeseeable complications.  Plan: Bronchoscopy and endobronchial ultrasound on Monday, 01/16/2016.  Melrose Nakayama, MD [facility] [phone]

## 2016-01-16 NOTE — Anesthesia Procedure Notes (Signed)
Procedure Name: Intubation Date/Time: 01/16/2016 7:37 AM Performed by: Lance Coon Pre-anesthesia Checklist: Patient identified, Emergency Drugs available, Suction available, Patient being monitored and Timeout performed Patient Re-evaluated:Patient Re-evaluated prior to inductionOxygen Delivery Method: Circle system utilized Preoxygenation: Pre-oxygenation with 100% oxygen Intubation Type: IV induction Ventilation: Mask ventilation without difficulty Laryngoscope Size: Miller and 2 Grade View: Grade I Tube type: Oral Tube size: 8.5 mm Number of attempts: 1 Airway Equipment and Method: Stylet Placement Confirmation: ETT inserted through vocal cords under direct vision,  positive ETCO2 and breath sounds checked- equal and bilateral Secured at: 21 cm Tube secured with: Tape Dental Injury: Teeth and Oropharynx as per pre-operative assessment

## 2016-01-16 NOTE — Anesthesia Preprocedure Evaluation (Signed)
Anesthesia Evaluation  Patient identified by MRN, date of birth, ID band Patient awake    Reviewed: Allergy & Precautions, NPO status , Patient's Chart, lab work & pertinent test results  Airway Mallampati: I  TM Distance: >3 FB Neck ROM: Full    Dental   Pulmonary asthma , former smoker,    Pulmonary exam normal        Cardiovascular Normal cardiovascular exam     Neuro/Psych    GI/Hepatic GERD  Medicated and Controlled,  Endo/Other    Renal/GU      Musculoskeletal   Abdominal   Peds  Hematology   Anesthesia Other Findings   Reproductive/Obstetrics                             Anesthesia Physical Anesthesia Plan  ASA: III  Anesthesia Plan: General   Post-op Pain Management:    Induction: Intravenous  Airway Management Planned: Oral ETT  Additional Equipment:   Intra-op Plan:   Post-operative Plan: Extubation in OR  Informed Consent: I have reviewed the patients History and Physical, chart, labs and discussed the procedure including the risks, benefits and alternatives for the proposed anesthesia with the patient or authorized representative who has indicated his/her understanding and acceptance.     Plan Discussed with: CRNA and Surgeon  Anesthesia Plan Comments:         Anesthesia Quick Evaluation

## 2016-01-16 NOTE — Discharge Instructions (Signed)
Do not drive or engage in heavy physical activity for 24 hours.  You may resume normal activities tomorrow  You may cough up small amounts of blood over the next few days  You may use acetaminophen(Tylenol) or over the counter throat lozenges as needed for sore throat  Call [phone] if you develop chest pain, shortness of breath, fever > 101 F or cough up more than 2 tablespoons of blood  My office will contact you with a follow up appointment

## 2016-01-16 NOTE — Brief Op Note (Signed)
01/16/2016  9:23 AM  PATIENT:  [name]  50 y.o. female  PRE-OPERATIVE DIAGNOSIS:  MEDIASTINAL ADENOPATHY  POST-OPERATIVE DIAGNOSIS:  MEDIASTINAL ADENOPATHY  PROCEDURE:  Video Bronchoscopy with Brushings, Transbronchial biopsy and BAL Endobronchial Ultrasound with Mediastinal Lymph Node aspirations Wang Needle Biopsy of subcarinal lymph node  SURGEON:  Surgeon(s) and Role:    * Melrose Nakayama, MD - Primary   ANESTHESIA:   general  EBL:  Total I/O In: 1000 [I.V.:1000] Out: 10 [Blood:10]  BLOOD ADMINISTERED:none  DRAINS: none   LOCAL MEDICATIONS USED:  NONE  SPECIMEN:  Source of Specimen:  Level 7 nodes, RUL and tracheal biopsy  DISPOSITION OF SPECIMEN:  Path and micro (AFB and Fungal)  PLAN OF CARE: Discharge to home after PACU  PATIENT DISPOSITION:  PACU - hemodynamically stable.   Delay start of Pharmacological VTE agent (>24hrs) due to surgical blood loss or risk of bleeding: not applicable

## 2016-01-16 NOTE — Anesthesia Postprocedure Evaluation (Signed)
Anesthesia Post Note  Patient: Erin Zavala  Procedure(s) Performed: Procedure(s) (LRB): VIDEO BRONCHOSCOPY WITH ENDOBRONCHIAL ULTRASOUND (N/A)  Patient location during evaluation: PACU Anesthesia Type: General Level of consciousness: awake and alert Pain management: pain level controlled Vital Signs Assessment: post-procedure vital signs reviewed and stable Respiratory status: spontaneous breathing, nonlabored ventilation, respiratory function stable and patient connected to nasal cannula oxygen Cardiovascular status: blood pressure returned to baseline and stable Postop Assessment: no signs of nausea or vomiting Anesthetic complications: no    Last Vitals:  Vitals:   01/16/16 1045 01/16/16 1046  BP:  105/74  Pulse:  82  Resp:  (!) 33  Temp: 36.4 C     Last Pain:  Vitals:   01/16/16 0945  TempSrc:   PainSc: 0-No pain                 Rowe Warman DAVID

## 2016-01-16 NOTE — Interval H&P Note (Signed)
History and Physical Interval Note:  01/16/2016 7:08 AM  Erin Zavala  has presented today for surgery, with the diagnosis of MEDIASTINAL ADENOPATHY  The various methods of treatment have been discussed with the patient and family. After consideration of risks, benefits and other options for treatment, the patient has consented to  Procedure(s): [place] (N/A) as a surgical intervention .  The patient's history has been reviewed, patient examined, no change in status, stable for surgery.  I have reviewed the patient's chart and labs.  Questions were answered to the patient's satisfaction.     Melrose Nakayama

## 2016-01-16 NOTE — Transfer of Care (Signed)
Immediate Anesthesia Transfer of Care Note  Patient: Erin Zavala  Procedure(s) Performed: Procedure(s): VIDEO BRONCHOSCOPY WITH ENDOBRONCHIAL ULTRASOUND (N/A)  Patient Location: PACU  Anesthesia Type:General  Level of Consciousness: awake, alert , oriented and patient cooperative  Airway & Oxygen Therapy: Patient Spontanous Breathing and Patient connected to face mask oxygen  Post-op Assessment: Report given to RN and Post -op Vital signs reviewed and stable  Post vital signs: Reviewed and stable  Last Vitals:  Vitals:   01/16/16 0915 01/16/16 0917  BP:  103/60  Pulse: 96 (!) 117  Resp:  (!) 28  Temp:      Last Pain:  Vitals:   01/16/16 0638  TempSrc: Oral      Patients Stated Pain Goal: 2 (63/49/49 4473)  Complications: No apparent anesthesia complications

## 2016-01-17 ENCOUNTER — Encounter (HOSPITAL_COMMUNITY): Payer: Self-pay | Admitting: Thoracic Surgery (Cardiothoracic Vascular Surgery)

## 2016-01-17 LAB — ACID FAST SMEAR (AFB, MYCOBACTERIA): Acid Fast Smear: NEGATIVE

## 2016-01-17 LAB — ACID FAST SMEAR (AFB)

## 2016-01-17 NOTE — Op Note (Addendum)
NAMEGENIE, WENKE               ACCOUNT NO.:  [id number]  MEDICAL RECORD NO.:  [id number]  LOCATION:  [facility]                         FACILITY:  [facility]  PHYSICIAN:  Revonda Standard. Roxan Hockey, M.D.DATE OF BIRTH:  09/23/1965  DATE OF PROCEDURE:  01/16/2016 DATE OF DISCHARGE:  01/16/2016                              OPERATIVE REPORT   PREOPERATIVE DIAGNOSES:  Mediastinal adenopathy and interstitial lung disease.  POSTOPERATIVE DIAGNOSES:  Mediastinal adenopathy and interstitial lung disease.  PROCEDURES:   1.Video bronchoscopy with brushings, transbronchial biopsy and bronchoalveolar lavage. 2.Endobronchial ultrasound, mediastinal lymph node aspirations 3.Wang needle biopsy of subcarinal lymph node.  SURGEON:  Revonda Standard. Roxan Hockey, M.D.  ASSISTANT:  None.  ANESTHESIA:  General.  FINDINGS:  Aspirations of level 7 node showed possible granuloma. Right upper lobe and tracheal biopsies sent for permanent pathology. Right upper lobe brushings showed only benign cells.  CLINICAL NOTE:  Ms. Mcdougald is a 50 year old woman with history of ocular sarcoidosis many years ago.  She presents with chronic cough and shortness of breath.  This has become progressive and she is now short of breath with minimal exertion.  Workup revealed bilateral pulmonary infiltrates and interstitial changes and severe subcarinal adenopathy.  Her diffusion capacity is only 20% of predicted and she is oxygen dependent. She was referred for biopsy for diagnostic purposes.  Due to mediastinoscopy being very high risk, she was advised to undergo bronchoscopy and endobronchial ultrasound in hopes of making a diagnosis in a less invasive fashion.  She did understand that there was a significant possibility of a nondiagnostic study.  The indications, risks, benefits and alternatives were discussed in detail with the patient. She understood and accepted the risks and agreed to proceed.  OPERATIVE NOTE:  Ms. Pangilinan was  brought to the operating room on January 16, 2016.  She had induction of anesthesia and was intubated. After performing a time-out, flexible fiberoptic bronchoscopy was performed via the endotracheal tube.  There was some swelling of the mucosa particularly overlying the carina.  There was normal endobronchial anatomy and no endobronchial lesions.  There were some thick white secretions.  The bronchoscope was removed.  The endobronchial ultrasound probe then was advanced.  The subcarinal level 7 lymph nodes were identified.  There was less of a window around the vessels than would have been thought, but there was an adequate window for aspirations and multiple aspirations were performed of this node with part of the specimen being placed on the slides for immediate examination and the remainder being placed into cytologic preparation fluid for permanent cytology.  As aspirations were performed, they were periodically sent for examination.  With each aspiration, the needle was advanced into the lymph node with ultrasound visualization and suction was applied as the needle was passed back and forth in the node.  A total of five aspirations were performed on the subcarinal node.    While awaiting the results of those, the endobronchial ultrasound probe was removed and the bronchoscope was reinserted.  Brushings were taken from the right upper lobe bronchi.  The brush was advanced into different subsegmental bronchi and then brushings were performed. There was some minor bleeding with the brushings.  A  transbronchial biopsy then was performed and there was more bleeding with that.  No additional biopsies were performed.  The bleeding ultimately cleared with irrigation with saline and also small amounts of dilute epinephrine. Bronchoalveolar lavage was performed twice. Each time 100 mL of saline was used with a return of approximately 30 mL of saline. One specimen was sent for AFB and fungal cultures  and the other was sent for cytology.  Aspirations of the subcarinal node were performed with a Wang needle as the larger bore of that needle might be helpful in identifying granulomas.  A final inspection was made.  There was no ongoing bleeding.  The patient was extubated in the operating room and taken to the postanesthetic care unit in good condition.  Brushings from the right upper lobe showed only benign bronchial cells.  One of the aspirations from the level 7 node was suspicious for granuloma.     Revonda Standard Roxan Hockey, M.D.     SCH/MEDQ  D:  01/16/2016  T:  01/17/2016  Job:  [id number]

## 2016-01-18 ENCOUNTER — Telehealth (HOSPITAL_COMMUNITY): Payer: Self-pay | Admitting: Cardiology

## 2016-01-18 NOTE — Telephone Encounter (Signed)
Patients pregnancy information given to opsumit speciality pharmacist  Lenna Sciara, PharmD aware and will continue to process opsumit

## 2016-01-19 LAB — CULTURE, RESPIRATORY: CULTURE: NO GROWTH

## 2016-01-19 LAB — CULTURE, RESPIRATORY W GRAM STAIN

## 2016-01-27 ENCOUNTER — Other Ambulatory Visit: Payer: Self-pay

## 2016-01-27 DIAGNOSIS — D860 Sarcoidosis of lung: Secondary | ICD-10-CM

## 2016-02-06 ENCOUNTER — Encounter (HOSPITAL_COMMUNITY): Payer: Self-pay

## 2016-02-14 LAB — FUNGUS CULTURE RESULT

## 2016-02-14 LAB — FUNGUS CULTURE WITH STAIN

## 2016-02-14 LAB — FUNGAL ORGANISM REFLEX

## 2016-02-27 ENCOUNTER — Ambulatory Visit: Payer: Self-pay | Admitting: Pulmonary Disease

## 2016-02-28 ENCOUNTER — Other Ambulatory Visit: Payer: Medicaid Other

## 2016-02-28 ENCOUNTER — Ambulatory Visit (INDEPENDENT_AMBULATORY_CARE_PROVIDER_SITE_OTHER): Payer: Medicaid Other | Admitting: Pulmonary Disease

## 2016-02-28 ENCOUNTER — Encounter: Payer: Self-pay | Admitting: Pulmonary Disease

## 2016-02-28 ENCOUNTER — Telehealth (HOSPITAL_COMMUNITY): Payer: Self-pay | Admitting: Pharmacist

## 2016-02-28 VITALS — BP 106/64 | HR 97 | Ht 60.0 in | Wt 121.8 lb

## 2016-02-28 DIAGNOSIS — K219 Gastro-esophageal reflux disease without esophagitis: Secondary | ICD-10-CM

## 2016-02-28 DIAGNOSIS — D869 Sarcoidosis, unspecified: Secondary | ICD-10-CM

## 2016-02-28 DIAGNOSIS — Z23 Encounter for immunization: Secondary | ICD-10-CM

## 2016-02-28 DIAGNOSIS — J9611 Chronic respiratory failure with hypoxia: Secondary | ICD-10-CM | POA: Diagnosis not present

## 2016-02-28 DIAGNOSIS — I272 Pulmonary hypertension, unspecified: Secondary | ICD-10-CM

## 2016-02-28 LAB — ACID FAST CULTURE WITH REFLEXED SENSITIVITIES: ACID FAST CULTURE - AFSCU3: NEGATIVE

## 2016-02-28 MED ORDER — BECLOMETHASONE DIPROPIONATE 80 MCG/ACT IN AERS: 2.0000 | INHALATION_SPRAY | Freq: Two times a day (BID) | RESPIRATORY_TRACT | 6 refills | Status: DC

## 2016-02-28 MED ORDER — ESOMEPRAZOLE MAGNESIUM 40 MG PO CPDR: 40.0000 mg | DELAYED_RELEASE_CAPSULE | Freq: Every day | ORAL | 3 refills | Status: DC

## 2016-02-28 NOTE — Patient Instructions (Addendum)
   Continue using your Qvar inhaler and Prednisone at your current dose.  Call me if you have any new breathing problems or questions before your next appointment.   Remember to come back on Friday to get your skin PPD test read.   TESTS ORDERED: 1. Spirometry with bronchodilator challenge & DLCO at next appointment 2. 6MWT with oxygen titration at next appointment 3. Serum Quantiferon-TB today  4. Skin PPD

## 2016-02-28 NOTE — Telephone Encounter (Signed)
ASSIST approved for patient assistance for Adcirca through 05/28/16.   Ruta Hinds. Velva Harman, PharmD, BCPS, CPP Clinical Pharmacist Pager: [phone] Phone: [phone] 02/28/2016 11:44 AM

## 2016-02-28 NOTE — Progress Notes (Signed)
Subjective:    Patient ID: Erin Zavala, female    DOB: 1965-04-24, 50 y.o.   MRN: [id number]  C.C.:  Follow-up for Sarcoidosis, Chronic Hypoxic Respiratory Failure, Moderate Pulmonary Hypertension, & GERD.  HPI  Sarcoidosis:  Tapered to Prednisone 52m daily previously. Biopsy confirmed on 11/6. Referred to [facility] for lung transplant evaluation & is scheduled to see on 1/22. Started on Qvar 80 g 2 puffs inhaled twice a day at last appointment. She feels like her breathing is doing better. She is walking regularly for exercise. Reports she is compliant with her inhaler and Prednisone. She reports recently she has had a mild runny nose and mild cough due to a "tickle" in the back of her throat. She reports no other significant cough except with severe exertion. No syncope or near syncope. Reports she still has to set her appointment with Opthalmology.   Chronic Hypoxic Respiratory Failure:  Continuing oxygen at 2 L/m at rest & 3 L/m with exertion. She is continuing to use her oxygen as prescribed and at work. She is monitoring her oxygen with her own pulse oximeter.   Moderate Pulmonary Hypertension:  Confirmed with [facility] on 10/2. Started on Macitentan and Adcirca previously. Followed by Cardiology. Referred to pulmonary rehabilitation at last appointment but couldn't afford the co-pay. Denies any lower extremity edema. She reports her palpitations have completely resolved.   GERD:  Prescribed Nexium but is out of it. She reports recently she has had some mild dyspepsia that she attributes to her recent diet. Denies any recent morning brash water taste. She has adjusted back to her diet recently.   Review of Systems She has noticed some easy fatigue. No focal weakness, numbness, or tingling. No rashes but does bruise easily. No abdominal pain, nausea, or emesis.   Allergies  Allergen Reactions  . No Known Allergies     Current Outpatient Prescriptions on File Prior to Visit  Medication Sig  Dispense Refill  . aspirin 81 MG tablet Take 1 tablet (81 mg total) by mouth daily. 30 tablet 0  . beclomethasone (QVAR) 80 MCG/ACT inhaler Inhale 2 puffs into the lungs 2 (two) times daily. 1 Inhaler 3  . dextromethorphan-guaiFENesin (MUCINEX DM) 30-600 MG 12hr tablet Take 1 tablet by mouth 2 (two) times daily as needed for cough. 14 tablet 0  . feeding supplement, ENSURE ENLIVE, (ENSURE ENLIVE) LIQD Take 237 mLs by mouth 3 (three) times daily between meals. 90 Bottle 0  . Macitentan (OPSUMIT) 10 MG TABS Take 1 tablet by mouth daily.    . predniSONE (DELTASONE) 10 MG tablet 1 tablet by mouth daily 30 tablet 3  . sodium chloride (OCEAN) 0.65 % SOLN nasal spray Place 1 spray into both nostrils as needed for congestion. 480 mL 0  . tadalafil, PAH, (ADCIRCA) 20 MG tablet Take 1 tablet (20 mg total) by mouth daily. 30 tablet 11   No current facility-administered medications on file prior to visit.     Past Medical History:  Diagnosis Date  . Asthma    as a child  . Cough   . Dyspnea   . GERD (gastroesophageal reflux disease)   . Migraines   . Oxygen dependent   . Pulmonary hypertension   . Sarcoidosis ([facility]     Past Surgical History:  Procedure Laterality Date  . CARDIAC CATHETERIZATION N/A 12/12/2015   Procedure: Right Heart Cath;  Surgeon: DLarey Dresser MD;  [facility];  Service: Cardiovascular;  Laterality: N/A;  .  MULTIPLE TOOTH EXTRACTIONS  01/13/16   all teeth will be removed  . tuballigation    . VIDEO BRONCHOSCOPY WITH ENDOBRONCHIAL ULTRASOUND N/A 01/16/2016   Procedure: VIDEO BRONCHOSCOPY WITH ENDOBRONCHIAL ULTRASOUND;  Surgeon: Melrose Nakayama, MD;  Location: [facility] OR;  Service: Thoracic;  Laterality: N/A;    Family History  Problem Relation Age of Onset  . Hypertension Mother   . Hypertension Father   . Rheumatologic disease Neg Hx     Social History   Social History  . Marital status: Married    Spouse name: N/A  . Number of children: N/A    . Years of education: N/A   Social History Main Topics  . Smoking status: Former Smoker    Quit date: 01/11/2011  . Smokeless tobacco: Never Used     Comment: former occasional social smoker  . Alcohol use No  . Drug use: No  . Sexual activity: Not Asked   Other Topics Concern  . None   Social History Narrative   Patient denies any bird or mold exposure. Currently works with exposure to inhaled perfumes and chemicals. 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 Previous exposure to inhaled dust while working in [employer]. Denies any IV drug use or previous incarceration. No known tuberculosis exposure. She has never lived nor volunteered at a homeless shelter.      Objective:   Physical Exam BP 106/64 (BP Location: Left Arm, Cuff Size: Normal)   Pulse 97   Ht 5' (1.524 m)   Wt 121 lb 12.8 oz (55.2 kg)   SpO2 98%   BMI 23.79 kg/m  General:  Awake. No distress. Thin female. Integument:  Warm & dry. No rash on exposed skin.  HEENT:  Moist mucus membranes. No nasal turbinate swelling. No nasal turbinate swelling.  Cardiovascular:  Regular rate. No edema. Normal S1 & S2. Pulmonary:  Good aeration bilaterally. Normal work of breathing on oxygen. Speaking complete sentencees. Abdomen: Soft. Normal bowel sounds. Nontender. Musculoskeletal:  Normal bulk and tone. No joint deformity or effusion appreciated.  PFT 12/27/15: FVC 1.53 L (61%) FEV1 1.21 L (60%) FEV1/FVC 0.79 FEF 25-75 1.03 L (46%) negative bronchodilator response TLC 2.62 L (58%) RV 70% ERV 109% DLCO unreliable  6MWT 12/28/15:  Walked 264 meters / Baseline Sat 100% on 2 L/m / Nadir Sat 88% on 2 L/m (required 3 L/m to maintain saturation with exertion)  IMAGING CTA CHEST 11/01/15 (previously reviewed by me):No pulmonary emboli noted. No pleural effusion or thickening. Enlarged main pulmonary artery. No pericardial effusion appreciated. Bulky mediastinal and  hilar lymphadenopathy. Bilateral upper lung cystic changes as well as subpleural consolidation within right lower lung and bilateral upper lobes. Bilateral patchy groundglass also noted.  CARDIAC R[facility] (12/12/15): RA Mean - 3 RV - 55/1 PA - 59/22 (36) PCWP - 5 PA Sat - 75% Aortic Sat - 100% CO - 4.2 CI - 2.92 PVR - 7.4 WU  48 HOUR HOLTER MONITOR (12/05/15):  Sinus rhythm with PVCs & PACs. HR 70-160.  TTE (11/02/15):LV normal in size with EF 55-60%.No wall motion abnormalities. Grade 1 diastolic dysfunction. LA &RA normal in size. RV normal in size and function. Pulmonary artery systolic pressure 60 mmHg. Diastolic flattening and systolic flattening of the intraventricular septum. No aortic stenosis or regurgitation. Aortic root normal in size. Trivial mitral regurgitation without stenosis. No pulmonic stenosis. Mild tricuspid regurgitation. Trivial pericardial effusion.  PATHOLOGY BAL RUL (01/16/16):  No malignancy w/  granulomatous inflammation EBUS FNA Level 7 (01/16/16):  No malignancy & vague granulomatous inflammation Brushing RUL (01/16/16):  Vague granulomatous inflammation Transbronchial Biopsies RUL & Trachea (01/16/16):  Benign respiratory epthelium  MICROBIOLOGY BAL (01/16/16):  Bacteria negative / AFB negative / Fungus negative  LABS 12/09/15 LDH:  370 CRP:  3.4 ESR:  112 ANA:  Negative ACE:  136 Anti-CCP:  <16 RF:  <14 Smith Ab:  <1.0 DS DNA Ab:  1 SSA:  <1.0 SSB:  <1.0 3/17 ACE:  120 ANA:  1:80 (speckled)    Assessment & Plan:  50 y.o. previously diagnosed with sarcoidosis, chronic hypoxic respiratory failure, moderate pulmonary hypertension, & GERD. Reviewed patient's previous pathology results which confirmed the underlying suspicion of sarcoidosis. Patient is unable to afford pulmonary rehabilitation which is unfortunate. She is continuing to use oxygen and remains active. I had lengthy discussion today with her regarding potential further worsening of her  pulmonary function with time and the potential need for lung transplant. At this time I'm holding off on initiating methotrexate as a steroid sparing agent pending evaluation and evaluation for latent tuberculosis. Patient's previous palpitations seem to have significantly improved with her current treatment for pulmonary arterial hypertension. I instructed the patient contact my office if she had any new breathing problems or questions before her next appointment as I would be happy to see her sooner.  1. Sarcoidosis: Continuing patient on current dose of prednisone 10 mg by mouth daily & Qvar 160 g inhaled twice a day. Awaiting lung transplant evaluation by [facility]. Repeat spirometry with bronchodilator challenge & DLCO at next appointment. Checking serum QuantiFERON-TB today as well as placing skin PPD test. 2. Chronic Hypoxic Respiratory Failure:  Continuing oxygen at 2 L/m at rest & 3 L/m with exertion. Repeat 6 minute walk test with oxygen titration at next appointment. 3. Moderate Pulmonary Hypertension:  Continuing patient on Adcirca & Macitentan. Following with cardiology. Repeat 6 minute walk test with oxygen titration at next appointment. 4. GERD: Refill patient's Nexium today. Reminded her of appropriate dietary and lifestyle modifications. 5. Health Maintenance:  Pneumovax 02 November 2015. Administering Influenza Vaccine today.  6. Follow-up: Patient to return to clinic in 2 months or sooner if needed.  Sonia Baller Ashok Cordia, M.D. [facility] Pulmonary & Critical Care Pager:  [phone] After 3pm or if no response, call [phone] 4:11 PM 02/28/16

## 2016-03-01 LAB — QUANTIFERON TB GOLD ASSAY (BLOOD)
INTERFERON GAMMA RELEASE ASSAY: NEGATIVE
Mitogen-Nil: 1.08 IU/mL
QUANTIFERON TB AG MINUS NIL: 0 [IU]/mL
Quantiferon Nil Value: 0.02 IU/mL

## 2016-03-02 ENCOUNTER — Telehealth: Payer: Self-pay | Admitting: Pulmonary Disease

## 2016-03-02 LAB — TB SKIN TEST
INDURATION: 0 mm
TB SKIN TEST: NEGATIVE

## 2016-03-02 NOTE — Telephone Encounter (Signed)
Pt came in to have PPD read, which was negative. Pt requested a note for work stating she was seen today, note has been provided to pt. Will route to [facility] for a FYI.

## 2016-03-09 ENCOUNTER — Telehealth: Payer: Self-pay | Admitting: Pulmonary Disease

## 2016-03-09 NOTE — Telephone Encounter (Signed)
Received a PA request for esomeprazole magnesium (Nexium) 66m. Spoke with someone from [employer] that stated that the medication would be covered as long as it was the brand name Nexium. [facility] [address] [place] name brand, the RX went through. Patient is now able to get her medication.

## 2016-03-20 ENCOUNTER — Ambulatory Visit (HOSPITAL_COMMUNITY)
Admission: RE | Admit: 2016-03-20 | Discharge: 2016-03-20 | Disposition: A | Discharge status: Home / Self Care | Payer: Medicaid Other | Source: Ambulatory Visit | Attending: Cardiology | Admitting: Cardiology

## 2016-03-20 ENCOUNTER — Encounter (HOSPITAL_COMMUNITY): Payer: Self-pay

## 2016-03-20 VITALS — BP 104/62 | HR 92 | Wt 127.2 lb

## 2016-03-20 DIAGNOSIS — I272 Pulmonary hypertension, unspecified: Secondary | ICD-10-CM | POA: Diagnosis not present

## 2016-03-20 MED ORDER — TADALAFIL (PAH) 20 MG PO TABS: 40.0000 mg | ORAL_TABLET | Freq: Every day | ORAL | 11 refills | Status: DC

## 2016-03-20 NOTE — Progress Notes (Signed)
Pulmonology: Dr. Ashok Cordia Cardiology: Dr. [name]  51 yo with sarcoidosis, chronic hypoxemic respiratory failure on home oxygen, and pulmonary hypertension presents for cardiology evaluation.  She was diagnosed with ocular sarcoidosis in 1997. It seems like she did not have significant lung complications (that were recognized at least) until earlier this year.  She has now requires home oxygen.  CTA chest shows changes consistent with sarcoidosis, this has been confirmed by biopsy.  Echo in 8/17 had evidence for RV strain and elevated PA pressure.  [place] in 10/17 confirmed pulmonary arterial hypertension with normal right and left heart filling pressures.    She has started on macitentan followed by Adcirca.  Overall, breathing improved with pulmonary hypertension meds.  She is short of breath walking up a flight of steps, but she can climb the whole flight without stopping.  No dyspnea walking on flat ground.  No lightheadedness/syncope. No chest pain.    Labs (9/17): NA negative, ESR 112, anti-dsDNA negative, anti-CCP negative, RF < 14.  ACE 136.  Labs (10/17): K 4.1, creatinine 0.8 Labs (11/17): K 3.8, creatinine 0.91  ECG (10/17): NSR, normal  6 minute walk (10/17): 305 m.  6 minute walk (1/18): 305 m  PMH: 1. GERD 2. Pulmonary hypertension: Suspect Group 5 PH related to sarcoidosis.  - CTA chest (8/17) with no PE but mediastinal/hilar LAN, cystic lung changes, subpleural consolidation, patchy ground glass.  - Echo (8/17): EF 55-60%, D-shaped interventricular septum, PASP 60 mmHg. - ANA negative, ESR 112, anti-dsDNA negative, anti-CCP negative, RF < 14.  - RHC (10/17): mean RA 3, PA 59/22 mean 36, mean PCWP 5, CI 2.92, PVR 7.4 WU.  - PFTs (10/17): Restrictive.  3. Sarcoidosis: Diagnosed with ocular sarcoidosis in 1997. Significant pulmonary involvement noted in 8/17.  Lung biopsy positive.  4. Chronic hypoxemic respiratory failure: Home oxygen started this year.  5. Childhood  asthma.  Social History   Social History  . Marital status: Married    Spouse name: N/A  . Number of children: N/A  . Years of education: N/A   Social History Main Topics  . Smoking status: Former Smoker    Quit date: 01/11/2011  . Smokeless tobacco: Never Used     Comment: former occasional social smoker  . Alcohol use No  . Drug use: No  . Sexual activity: Not Asked   Other Topics Concern  . None   Social History Narrative   Patient denies any bird or mold exposure. Currently works with exposure to inhaled perfumes and chemicals. 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 Previous exposure to inhaled dust while working in [employer]. Denies any IV drug use or previous incarceration. No known tuberculosis exposure. She has never lived nor volunteered at a homeless shelter.   Family History  Problem Relation Age of Onset  . Hypertension Mother   . Hypertension Father   . Rheumatologic disease Neg Hx    ROS: All systems reviewed and negative except as per HPI.   Current Outpatient Prescriptions  Medication Sig Dispense Refill  . aspirin 81 MG tablet Take 1 tablet (81 mg total) by mouth daily. 30 tablet 0  . beclomethasone (QVAR) 80 MCG/ACT inhaler Inhale 2 puffs into the lungs 2 (two) times daily. 1 Inhaler 6  . dextromethorphan-guaiFENesin (MUCINEX DM) 30-600 MG 12hr tablet Take 1 tablet by mouth 2 (two) times daily as needed for cough. 14 tablet 0  . esomeprazole (NEXIUM) 40 MG capsule Take 1  capsule (40 mg total) by mouth daily at 12 noon. 30 capsule 3  . feeding supplement, ENSURE ENLIVE, (ENSURE ENLIVE) LIQD Take 237 mLs by mouth 3 (three) times daily between meals. 90 Bottle 0  . Macitentan (OPSUMIT) 10 MG TABS Take 1 tablet by mouth daily.    . predniSONE (DELTASONE) 10 MG tablet 1 tablet by mouth daily 30 tablet 3  . sodium chloride (OCEAN) 0.65 % SOLN nasal spray Place 1 spray into both nostrils as  needed for congestion. 480 mL 0  . tadalafil, PAH, (ADCIRCA) 20 MG tablet Take 2 tablets (40 mg total) by mouth daily. 30 tablet 11   No current facility-administered medications for this encounter.    BP 104/62   Pulse 92   Wt 127 lb 4 oz (57.7 kg) Comment: 122lbs on home scale  SpO2 99% Comment: on 2L of O2  BMI 24.85 kg/m  General: Thin, NAD Neck: No JVD, no thyromegaly or thyroid nodule.  Lungs: Clear to auscultation bilaterally with normal respiratory effort. CV: Nondisplaced PMI.  Heart regular S1/S2 (loud P2), no S3/S4, no murmur.  No peripheral edema.  No carotid bruit.  Normal pedal pulses.  Abdomen: Soft, nontender, no hepatosplenomegaly, no distention.  Skin: Intact without lesions or rashes.  Neurologic: Alert and oriented x 3.  Psych: Normal affect. Extremities: No clubbing or cyanosis.  HEENT: Normal.   Assessment/Plan: 1. Sarcoidosis: Followed by Dr. Ashok Cordia.  Ocular and lung involvement, biopsy-proven.  She is on prednisone and home oxygen. She will be evaluated for lung transplant at [facility].  2. Pulmonary hypertension: Pulmonary arterial hypertension. Suspect Group 5 PH related to sarcoidosis. CTA chest showed no PE but did show changes of sarcoidosis.  PFTs were restrictive.  Echo showed RV strain and elevated PA pressure.  PAH was confirmed by RHC with PVR 7.4 WU.  Serologic workup was negative.  - 6 minute walk done today => stable.  - Continue macitentan and tadalafil 40 mg daily.   Followup in 2 months.     Loralie Champagne 03/20/2016

## 2016-03-20 NOTE — Patient Instructions (Signed)
Increase Adcirca to 40 mg (2 tabs) daily  Your physician recommends that you schedule a follow-up appointment in: 2 months

## 2016-03-20 NOTE — Progress Notes (Signed)
6 min walk test completed  total feet= 1,000 Starting O2=97 Ending O2=84 Starting heart rate=108 Ending heart rate=132  On 3L of O2.

## 2016-03-23 ENCOUNTER — Encounter: Payer: Self-pay | Admitting: Pulmonary Disease

## 2016-03-23 ENCOUNTER — Ambulatory Visit (INDEPENDENT_AMBULATORY_CARE_PROVIDER_SITE_OTHER): Payer: Medicaid Other | Admitting: Pulmonary Disease

## 2016-03-23 DIAGNOSIS — D860 Sarcoidosis of lung: Secondary | ICD-10-CM

## 2016-03-23 DIAGNOSIS — K219 Gastro-esophageal reflux disease without esophagitis: Secondary | ICD-10-CM

## 2016-03-23 DIAGNOSIS — R0902 Hypoxemia: Secondary | ICD-10-CM

## 2016-03-23 DIAGNOSIS — R0981 Nasal congestion: Secondary | ICD-10-CM

## 2016-03-23 MED ORDER — PREDNISONE 10 MG PO TABS: ORAL_TABLET | ORAL | 0 refills | Status: DC

## 2016-03-23 MED ORDER — DOXYCYCLINE HYCLATE 100 MG PO TABS: 100.0000 mg | ORAL_TABLET | Freq: Two times a day (BID) | ORAL | 0 refills | Status: DC

## 2016-03-23 NOTE — Assessment & Plan Note (Signed)
Recent sinus congestion. Denies fevers and chills. Has sinus headaches. Plan : 1. Start Flonase, 2 squirts per nostril at bedtime. 2. Start Claritin-D-24. One tablet daily. I expect, she'll not be on this long time. She does not have high blood pressure issues. 3. Increase prednisone to 30 mg daily for 5 days, followed by 20 mg for 5 days, then cut down to 10 mg daily which is her usual dose. 4. Doxycycline, 100 mg, one tablet twice a day to start over the weekend if she's not better. 5. Obviously I told her to go to the ER if she is worse over the weekend.

## 2016-03-23 NOTE — Patient Instructions (Signed)
It was a pleasure taking care of you today!  You are diagnosed with bronchitis and sinus congestion.   Start Flonase, 2 squirts per nostril at bedtime. Start Claritin D -24, 1 tablet daily for 5-7 days.  Increase your prednisone to 30 mg/d for 5 days then 20 mg/d for 5 days then resume 10 mg/d.  Take Doxycycline, 100 mg/tab, 1 tab 2x/day if not better over the weekend.  Continue with Nexium.  Continue with your O2.   Go to the ER if worse over the weekend.   Follow-up with Dr. Ashok Cordia as scheduled.

## 2016-03-23 NOTE — Progress Notes (Signed)
Subjective:    Patient ID: Erin Zavala, female    DOB: 1965/03/20, 51 y.o.   MRN: [id number]  HPI   Patient is being seen for sarcoidosis, chronic hypoxemic respiratory failure, pulmonary hypertension, reflux disease. He was last seen by Dr. Milinda Hirschfeld in December 2017. At that time, she was stable. She is chronically on prednisone, recently at 10 mg a day. She started on Qvar during the last visit.  Since last seen, she was doing her usual self until the last 3-4 days. Started having nasal congestion, sinus headaches and post nasal drip.  Maybe a little more SOB than baseline today.  Same cough. (-) fevers, chills.  (-) sick contacts.    Review of Systems  Constitutional: Negative.   HENT: Positive for congestion, sinus pain and sinus pressure.   Eyes: Negative.   Respiratory: Positive for shortness of breath.   Cardiovascular: Negative.   Gastrointestinal: Negative.   Endocrine: Negative.   Genitourinary: Negative.   Musculoskeletal: Negative.   Skin: Negative.   Allergic/Immunologic: Negative.   Neurological: Negative.   Hematological: Negative.   Psychiatric/Behavioral: Negative.   All other systems reviewed and are negative.      Objective:   Physical Exam  Vitals:  Vitals:   03/23/16 1202  BP: 106/64  Pulse: 88  SpO2: 94%  Weight: 127 lb 6.4 oz (57.8 kg)  Height: 5' (1.524 m)    Constitutional/General:  Pleasant, well-nourished, well-developed, not in any distress,  Comfortably seating.  Well kempt  Body mass index is 24.88 kg/m. Wt Readings from Last 3 Encounters:  03/23/16 127 lb 6.4 oz (57.8 kg)  03/20/16 127 lb 4 oz (57.7 kg)  02/28/16 121 lb 12.8 oz (55.2 kg)      HEENT: Pupils equal and reactive to light and accommodation. Anicteric sclerae. Normal nasal mucosa.   No oral  lesions,  mouth clear,  oropharynx clear, no postnasal drip. (-) Oral thrush. No dental caries.  Airway - Mallampati class III-IV  Neck: No masses. Midline trachea. No JVD, (-)  LAD. (-) bruits appreciated.  Respiratory/Chest: Grossly normal chest. (-) deformity. (-) Accessory muscle use.  Symmetric expansion. (-) Tenderness on palpation.  Resonant on percussion.  Diminished BS on both lower lung zones. (-) wheezing, crackles, rhonchi (-) egophony  Cardiovascular: Regular rate and  rhythm, heart sounds normal, no murmur or gallops, no peripheral edema  Gastrointestinal:  Normal bowel sounds. Soft, non-tender. No hepatosplenomegaly.  (-) masses.   Musculoskeletal:  Normal muscle tone. Normal gait.   Extremities: Grossly normal. (-) clubbing, cyanosis.  (-) edema  Skin: (-) rash,lesions seen.   Neurological/Psychiatric : alert, oriented to time, place, person. Normal mood and affect           Assessment & Plan:  Sinus congestion Recent sinus congestion. Denies fevers and chills. Has sinus headaches. Plan : 1. Start Flonase, 2 squirts per nostril at bedtime. 2. Start Claritin-D-24. One tablet daily. I expect, she'll not be on this long time. She does not have high blood pressure issues. 3. Increase prednisone to 30 mg daily for 5 days, followed by 20 mg for 5 days, then cut down to 10 mg daily which is her usual dose. 4. Doxycycline, 100 mg, one tablet twice a day to start over the weekend if she's not better. 5. Obviously I told her to go to the ER if she is worse over the weekend.  Pulmonary sarcoidosis (HCC) Recent sinus congestion. We'll give her prednisone boost over 10 days and resume  10 mg daily after that.. Continue Qvar as instructed. Continue oxygen as instructed. Antibiotics for possible bronchitis. Patient advised to go to the ER if not better.  GERD (gastroesophageal reflux disease) Stable. Cont nexium  Hypoxemia Cont O2 2L 24/7, 3L with exertion.      Patient will follow up with Dr. Ashok Cordia as scheduled, sooner if not better.     Erin Becton, MD 03/23/2016   12:57 PM [facility] [place] Pager: [phone] Office: [phone], Fax: [phone]

## 2016-03-23 NOTE — Assessment & Plan Note (Signed)
Stable. Cont nexium

## 2016-03-23 NOTE — Assessment & Plan Note (Signed)
Recent sinus congestion. We'll give her prednisone boost over 10 days and resume 10 mg daily after that.. Continue Qvar as instructed. Continue oxygen as instructed. Antibiotics for possible bronchitis. Patient advised to go to the ER if not better.

## 2016-03-23 NOTE — Assessment & Plan Note (Signed)
Cont O2 2L 24/7, 3L with exertion.

## 2016-04-17 ENCOUNTER — Telehealth (HOSPITAL_COMMUNITY): Payer: Self-pay | Admitting: *Deleted

## 2016-04-18 ENCOUNTER — Telehealth: Payer: Self-pay

## 2016-04-18 NOTE — Telephone Encounter (Signed)
   Pt sees Dr. Ashok Cordia routinely and she has f/u with him on 2/22. Is it OK for you to send the note to him?   Thanks.  [name], MD 04/18/2016, 11:12 AM [place] Pager [phone] After 3 pm or if no answer, call [phone]

## 2016-04-18 NOTE — Telephone Encounter (Signed)
AD  You saw this pt. For an acute visit and below for your recommendation. She is requesting a refill of her prednisone. Is that ok with you.          Editor: Rush Landmark, MD (Physician)    It was a pleasure taking care of you today!  You are diagnosed with bronchitis and sinus congestion.   Start Flonase, 2 squirts per nostril at bedtime. Start Claritin D -24, 1 tablet daily for 5-7 days.  Increase your prednisone to 30 mg/d for 5 days then 20 mg/d for 5 days then resume 10 mg/d.  Take Doxycycline, 100 mg/tab, 1 tab 2x/day if not better over the weekend.  Continue with Nexium.  Continue with your O2.   Go to the ER if worse over the weekend.   Follow-up with Dr. Ashok Cordia as scheduled.

## 2016-04-19 ENCOUNTER — Telehealth: Payer: Self-pay | Admitting: Pulmonary Disease

## 2016-04-19 DIAGNOSIS — I272 Pulmonary hypertension, unspecified: Secondary | ICD-10-CM

## 2016-04-19 MED ORDER — PREDNISONE 10 MG PO TABS: ORAL_TABLET | ORAL | 3 refills | Status: DC

## 2016-04-19 NOTE — Telephone Encounter (Signed)
ATC. Left message for pt to call back. I need to verify which pharmacy she would like the prednisone sent to.[name]

## 2016-04-19 NOTE — Telephone Encounter (Signed)
Yes please send in a refill for her Prednisone.

## 2016-04-19 NOTE — Telephone Encounter (Signed)
Spoke with Lattie Haw at [facility], states that pt is interested in doing pulmonary maintenance program.   JN ok to order?  Thanks!

## 2016-04-19 NOTE — Telephone Encounter (Signed)
Prednisone sent to preferred pharmacy.  Pt aware.  Nothing further needed.

## 2016-04-19 NOTE — Telephone Encounter (Signed)
Absolutely.

## 2016-04-19 NOTE — Telephone Encounter (Signed)
[facility] near [place] [phone] this is the patient #

## 2016-04-20 NOTE — Telephone Encounter (Signed)
Order for pulmonary rehab has been placed. Nothing further is needed.

## 2016-05-01 ENCOUNTER — Telehealth (HOSPITAL_COMMUNITY): Payer: Self-pay | Admitting: Pharmacist

## 2016-05-01 MED ORDER — AMBRISENTAN 10 MG PO TABS: 10.0000 mg | ORAL_TABLET | Freq: Every day | ORAL | 11 refills | Status: DC

## 2016-05-01 NOTE — Telephone Encounter (Signed)
[place] [employer] denied [facility] because [facility] and [facility] are on their preferred list. Per Dr. [name], will switch to [facility]. Rx sent to [facility] today.   Ruta Hinds. Velva Harman, PharmD, BCPS, CPP Clinical Pharmacist Pager: [phone] Phone: [phone] 05/01/2016 10:53 AM

## 2016-05-02 ENCOUNTER — Telehealth: Payer: Self-pay | Admitting: Pulmonary Disease

## 2016-05-02 NOTE — Telephone Encounter (Signed)
This order was placed on 04/20/16 by Leigh. Called pulmonary rehab and left a message with Lattie Haw to call us back.

## 2016-05-02 NOTE — Telephone Encounter (Signed)
Rehab called back stated someone had made a appt from it

## 2016-05-03 ENCOUNTER — Ambulatory Visit (INDEPENDENT_AMBULATORY_CARE_PROVIDER_SITE_OTHER): Payer: Medicaid Other | Admitting: *Deleted

## 2016-05-03 ENCOUNTER — Ambulatory Visit (INDEPENDENT_AMBULATORY_CARE_PROVIDER_SITE_OTHER)
Admission: RE | Admit: 2016-05-03 | Discharge: 2016-05-03 | Disposition: A | Discharge status: Home / Self Care | Payer: Medicaid Other | Source: Ambulatory Visit | Attending: Pulmonary Disease | Admitting: Pulmonary Disease

## 2016-05-03 ENCOUNTER — Encounter: Payer: Self-pay | Admitting: Pulmonary Disease

## 2016-05-03 ENCOUNTER — Ambulatory Visit: Payer: Medicaid Other | Admitting: *Deleted

## 2016-05-03 ENCOUNTER — Ambulatory Visit (INDEPENDENT_AMBULATORY_CARE_PROVIDER_SITE_OTHER): Payer: Medicaid Other | Admitting: Pulmonary Disease

## 2016-05-03 VITALS — BP 102/68 | HR 85 | Ht 60.0 in | Wt 127.0 lb

## 2016-05-03 DIAGNOSIS — J9611 Chronic respiratory failure with hypoxia: Secondary | ICD-10-CM | POA: Diagnosis not present

## 2016-05-03 DIAGNOSIS — R05 Cough: Secondary | ICD-10-CM | POA: Diagnosis not present

## 2016-05-03 DIAGNOSIS — I272 Pulmonary hypertension, unspecified: Secondary | ICD-10-CM | POA: Diagnosis not present

## 2016-05-03 DIAGNOSIS — K219 Gastro-esophageal reflux disease without esophagitis: Secondary | ICD-10-CM

## 2016-05-03 DIAGNOSIS — J209 Acute bronchitis, unspecified: Secondary | ICD-10-CM

## 2016-05-03 DIAGNOSIS — D869 Sarcoidosis, unspecified: Secondary | ICD-10-CM

## 2016-05-03 DIAGNOSIS — R059 Cough, unspecified: Secondary | ICD-10-CM

## 2016-05-03 DIAGNOSIS — R0602 Shortness of breath: Secondary | ICD-10-CM

## 2016-05-03 LAB — POCT INFLUENZA A/B
Influenza A, POC: NEGATIVE
Influenza B, POC: NEGATIVE

## 2016-05-03 MED ORDER — PREDNISONE 20 MG PO TABS: 20.0000 mg | ORAL_TABLET | Freq: Every day | ORAL | 0 refills | Status: DC

## 2016-05-03 MED ORDER — BENZONATATE 100 MG PO CAPS
100.0000 mg | ORAL_CAPSULE | Freq: Three times a day (TID) | ORAL | 0 refills | Status: DC | PRN
Start: 2016-05-03 — End: 2016-05-16

## 2016-05-03 MED ORDER — RANITIDINE HCL 150 MG PO TABS: 150.0000 mg | ORAL_TABLET | Freq: Every day | ORAL | 3 refills | Status: DC

## 2016-05-03 NOTE — Progress Notes (Signed)
Subjective:    Patient ID: Erin Zavala, female    DOB: 09-12-1965, 51 y.o.   MRN: [id number]  C.C.:  Follow-up for Sarcoidosis, Chronic Hypoxic Respiratory Failure, Moderate Pulmonary Hypertension, & GERD.  HPI  Cough:  She reports she developed chills and a headache on Monday. She then developed a cough and diarrhea. She feels like her wheezing and coughing have improved since Tuesday when they were worse. She reports she did have brief contact on Sunday with someone with the Flu. Reports her cough is nonproductive.   Sarcoidosis: Biopsy confirmed on 01/16/16: Previously referred to [facility] for lung transplant evaluation rescheduled her appointment to 3/19. Prednisone tapered to 10 mg daily. Patient started on Qvar 160 g inhaled twice daily previously. She reports she has been wheezing some. No syncope or near syncope. She has had some mildly blurred vision. She has an appointment on 3/9 Ophthalmologist. She denies any eye pain or erythema.   Chronic hypoxic respiratory failure: Previously prescribed oxygen at 2 L/m at rest and 3 L/m with exertion. Patient does have a home pulse oximeter that she uses. Patient required 3 L/m at rest & 4 L/m with exertion to maintain during her walk today.   Moderate pulmonary hypertension: Confirmed with right heart catheterization on 12/12/15. Follows with cardiology. Currently maintained on Ivy. Denies any lower extremity edema.   GERD: Previously prescribed Nexium. No reflux or dyspepsia. Minimal morning brash water taste.   Review of Systems She has been experiencing some myalgias. Denies any abdominal pain or nausea. She did have a mild sore, scratchy throat at the onset. She has had some hot & cold chills as well as sweats but no fever. She has had some chest tightness & pressure with her chest congestion. She reports she has had increased sinus congestion & drainage. She was seen on 1/12 & treated for an acute bronchitis with  antibiotics.   Allergies  Allergen Reactions  . No Known Allergies     Current Outpatient Prescriptions on File Prior to Visit  Medication Sig Dispense Refill  . aspirin 81 MG tablet Take 1 tablet (81 mg total) by mouth daily. 30 tablet 0  . beclomethasone (QVAR) 80 MCG/ACT inhaler Inhale 2 puffs into the lungs 2 (two) times daily. 1 Inhaler 6  . dextromethorphan-guaiFENesin (MUCINEX DM) 30-600 MG 12hr tablet Take 1 tablet by mouth 2 (two) times daily as needed for cough. 14 tablet 0  . esomeprazole (NEXIUM) 40 MG capsule Take 1 capsule (40 mg total) by mouth daily at 12 noon. 30 capsule 3  . feeding supplement, ENSURE ENLIVE, (ENSURE ENLIVE) LIQD Take 237 mLs by mouth 3 (three) times daily between meals. 90 Bottle 0  . predniSONE (DELTASONE) 10 MG tablet 1 tablet by mouth daily 30 tablet 3  . sodium chloride (OCEAN) 0.65 % SOLN nasal spray Place 1 spray into both nostrils as needed for congestion. 480 mL 0  . tadalafil, PAH, (ADCIRCA) 20 MG tablet Take 2 tablets (40 mg total) by mouth daily. 30 tablet 11  . ambrisentan (LETAIRIS) 10 MG tablet Take 1 tablet (10 mg total) by mouth daily. (Patient not taking: Reported on 05/03/2016) 30 tablet 11  . doxycycline (VIBRA-TABS) 100 MG tablet Take 1 tablet (100 mg total) by mouth 2 (two) times daily. (Patient not taking: Reported on 05/03/2016) 14 tablet 0   No current facility-administered medications on file prior to visit.     Past Medical History:  Diagnosis Date  . Asthma  as a child  . Cough   . Dyspnea   . GERD (gastroesophageal reflux disease)   . Migraines   . Oxygen dependent   . Pulmonary hypertension   . Sarcoidosis [facility])     Past Surgical History:  Procedure Laterality Date  . CARDIAC CATHETERIZATION N/A 12/12/2015   Procedure: Right Heart Cath;  Surgeon: Larey Dresser, MD;  Location: [facility];  Service: Cardiovascular;  Laterality: N/A;  . MULTIPLE TOOTH EXTRACTIONS  01/13/16   all teeth will be removed  .  tuballigation    . VIDEO BRONCHOSCOPY WITH ENDOBRONCHIAL ULTRASOUND N/A 01/16/2016   Procedure: VIDEO BRONCHOSCOPY WITH ENDOBRONCHIAL ULTRASOUND;  Surgeon: Melrose Nakayama, MD;  Location: [facility] OR;  Service: Thoracic;  Laterality: N/A;    Family History  Problem Relation Age of Onset  . Hypertension Mother   . Hypertension Father   . Rheumatologic disease Neg Hx     Social History   Social History  . Marital status: Married    Spouse name: N/A  . Number of children: N/A  . Years of education: N/A   Social History Main Topics  . Smoking status: Former Smoker    Quit date: 01/11/2011  . Smokeless tobacco: Never Used     Comment: former occasional social smoker  . Alcohol use No  . Drug use: No  . Sexual activity: Not Asked   Other Topics Concern  . None   Social History Narrative   Patient denies any bird or mold exposure. Currently works with exposure to inhaled perfumes and chemicals. 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 Previous exposure to inhaled dust while working in [employer]. Denies any IV drug use or previous incarceration. No known tuberculosis exposure. She has never lived nor volunteered at a homeless shelter.      Objective:   Physical Exam BP 102/68 (BP Location: Right Arm, Patient Position: Sitting, Cuff Size: Normal)   Pulse 85   Ht 5' (1.524 m)   Wt 127 lb (57.6 kg)   SpO2 98%   BMI 24.80 kg/m  Gen.: No distress. Awake. Alert. Integument: Warm and dry. No rash or bruising on exposed skin. HEENT: Minimal bilateral nasal turbinate swelling. No oral ulcers. No scleral icterus or injection. Cardiovascular: Regular rate and rhythm. No edema. No murmur, rub, or gallop. Pulmonary: Overall clear with auscultation with good aeration bilaterally. No accessory muscle use on supplemental oxygen. Speaking in complete sentences. Abdomen: Soft. Nondistended. Nontender. Extremities: No cyanosis  or clubbing.  PFT 12/27/15: FVC 1.53 L (61%) FEV1 1.21 L (60%) FEV1/FVC 0.79 FEF 25-75 1.03 L (46%) negative bronchodilator response TLC 2.62 L (58%) RV 70% ERV 109% DLCO unreliable  6MWT 05/03/16:  Walked 320 meters / Baseline Sat 99% on  3 L/m / Nadir Sat 68% on  3 L/m @ end of test (required 4 L/m with exertion to maintain with exertion but desaturated with coughing) 12/28/15:  Walked 264 meters / Baseline Sat 100% on 2 L/m / Nadir Sat 88% on 2 L/m (required 3 L/m to maintain saturation with exertion)  IMAGING CTA CHEST 11/01/15 (previously reviewed by me):No pulmonary emboli noted. No pleural effusion or thickening. Enlarged main pulmonary artery. No pericardial effusion appreciated. Bulky mediastinal and hilar lymphadenopathy. Bilateral upper lung cystic changes as well as subpleural consolidation within right lower lung and bilateral upper lobes. Bilateral patchy groundglass also noted.  CARDIAC RHC (12/12/15): RA Mean - 3 RV - 55/1 PA -  59/22 (36) PCWP - 5 PA Sat - 75% Aortic Sat - 100% CO - 4.2 CI - 2.92 PVR - 7.4 WU  48 HOUR HOLTER MONITOR (12/05/15):  Sinus rhythm with PVCs & PACs. HR 70-160.  TTE (11/02/15):LV normal in size with EF 55-60%.No wall motion abnormalities. Grade 1 diastolic dysfunction. LA &RA normal in size. RV normal in size and function. Pulmonary artery systolic pressure 60 mmHg. Diastolic flattening and systolic flattening of the intraventricular septum. No aortic stenosis or regurgitation. Aortic root normal in size. Trivial mitral regurgitation without stenosis. No pulmonic stenosis. Mild tricuspid regurgitation. Trivial pericardial effusion.  PATHOLOGY BAL RUL (01/16/16):  No malignancy w/ granulomatous inflammation EBUS FNA Level 7 (01/16/16):  No malignancy & vague granulomatous inflammation Brushing RUL (01/16/16):  Vague granulomatous inflammation Transbronchial Biopsies RUL & Trachea (01/16/16):  Benign respiratory epthelium  MICROBIOLOGY BAL  (01/16/16):  Bacteria negative / AFB negative / Fungus negative Quantiferon-TB 02/28/16:  Negative Skin PPD 03/02/16:  Negative (0 induration) Influenza DFA (05/03/16):  Negative   LABS 12/09/15 LDH:  370 CRP:  3.4 ESR:  112 ANA:  Negative ACE:  136 Anti-CCP:  <16 RF:  <14 Smith Ab:  <1.0 DS DNA Ab:  1 SSA:  <1.0 SSB:  <1.0 3/17 ACE:  120 ANA:  1:80 (speckled)    Assessment & Plan:  51 y.o. female with acute bronchitis/upper respiratory illness likely viral in etiology. She has a known history of pulmonary sarcoidosis, pulmonary hypertension, chronic hypoxic respiratory failure, & GERD. Patient's walk test today personally reviewed by me and patient obviously had desaturation during coughing spells. She does appear to have an increased oxygen requirement with exertion above her usual baseline. Overall she appears euvolemic with her underlying pulmonary hypertension ages no signs of acute decompensation. Given the timing of her symptom start and reportedly improving cough I do not think that Tamiflu would be of great benefit to the patient. I instructed the patient that she should seek immediate medical attention if she developed any worsening in her breathing whatsoever.  1. Acute bronchitis:Checking chest x-ray PA/LAT today. Holding off on antibiotic therapy. Cough suppression with Tessalon Perles. Also advised to use over-the-counter Mucinex. 2. Sarcoidosis: Continuing patient on Qvar 160 g inhaled twice a day. Increasing dose of prednisone to 20 mg daily for the next 4 days. Awaiting evaluation by [facility] lung transplant program. 3. Chronic hypoxic respiratory failure: Patient instructed to increase oxygen flow rate to 4 L/m with exertion. 4. Pulmonary hypertension: Following with cardiology. Continuing oxygen therapy with increased oxygen on exertion. Continuing  Macitentan & Adcirca. 5. GERD: Continuing Nexium. Increasing treatment with Zantac 150 mg by mouth daily at bedtime. 6. Health  maintenance: Status post influenza vaccine December 2017 & Pneumovax August 2017. Plan for Prevnar vaccine August 2018. 7. Follow-up: Patient to return to clinic in 1 week to ensure she is improving. Can likely follow up with me in 2-3 months afterwards with a repeat 6 minute walk test with oxygen titration and spirometry with DLCO at that time.  Erin Zavala, M.D. [facility] Pulmonary & Critical Care Pager:  [phone] After 3pm or if no response, call [phone] 4:45 PM 05/03/16

## 2016-05-03 NOTE — Progress Notes (Signed)
Test reviewed.  

## 2016-05-03 NOTE — Patient Instructions (Signed)
   I am send in a prescription for Prednisone 106m daily for 4 days. Once this is complete you can go back to your usual dose of 143mdialy.  You can continue taking Mucinex to help with your cough.  I'm also starting you on Zantac to take at night before bed to help with your reflux. You can continue taking your Nexium daily.  If you feel your cough or breathing are getting any worse then go immediately to the closest emergency department.  We will see you back in 1 week to make sure you are getting better.   TESTS ORDERED: 1. CXR PA/LAT TODAY

## 2016-05-10 ENCOUNTER — Encounter (HOSPITAL_COMMUNITY): Payer: Self-pay | Admitting: *Deleted

## 2016-05-10 ENCOUNTER — Ambulatory Visit (HOSPITAL_COMMUNITY): Payer: Medicaid Other

## 2016-05-11 ENCOUNTER — Ambulatory Visit: Payer: Medicaid Other | Admitting: Adult Health

## 2016-05-15 ENCOUNTER — Encounter (HOSPITAL_COMMUNITY): Payer: Self-pay | Admitting: *Deleted

## 2016-05-15 NOTE — Progress Notes (Signed)
I had Erin Zavala signed for the Pulmonary Maintenance Program to start 05/10/16. She did not show up. I have reached out to her and left her a message to cal the department 05/10/16. Thus far I have not heard from her.

## 2016-05-16 ENCOUNTER — Telehealth: Payer: Self-pay | Admitting: Pulmonary Disease

## 2016-05-16 ENCOUNTER — Other Ambulatory Visit (HOSPITAL_COMMUNITY): Payer: Self-pay | Admitting: *Deleted

## 2016-05-16 ENCOUNTER — Encounter: Payer: Self-pay | Admitting: Adult Health

## 2016-05-16 ENCOUNTER — Ambulatory Visit (INDEPENDENT_AMBULATORY_CARE_PROVIDER_SITE_OTHER): Payer: Medicaid Other | Admitting: Adult Health

## 2016-05-16 ENCOUNTER — Encounter: Payer: Self-pay | Admitting: Pulmonary Disease

## 2016-05-16 DIAGNOSIS — D860 Sarcoidosis of lung: Secondary | ICD-10-CM | POA: Diagnosis not present

## 2016-05-16 DIAGNOSIS — I272 Pulmonary hypertension, unspecified: Secondary | ICD-10-CM | POA: Diagnosis not present

## 2016-05-16 DIAGNOSIS — R0902 Hypoxemia: Secondary | ICD-10-CM | POA: Diagnosis not present

## 2016-05-16 DIAGNOSIS — K219 Gastro-esophageal reflux disease without esophagitis: Secondary | ICD-10-CM

## 2016-05-16 DIAGNOSIS — J209 Acute bronchitis, unspecified: Secondary | ICD-10-CM | POA: Diagnosis not present

## 2016-05-16 MED ORDER — MACITENTAN 10 MG PO TABS: 1.0000 | ORAL_TABLET | Freq: Every day | ORAL | 3 refills | Status: DC

## 2016-05-16 MED ORDER — PREDNISONE 10 MG PO TABS: ORAL_TABLET | ORAL | 5 refills | Status: DC

## 2016-05-16 NOTE — Telephone Encounter (Signed)
Letter has been faxed to [phone] to [facility] attention. Nothing further was needed.

## 2016-05-16 NOTE — Assessment & Plan Note (Signed)
Stable on current regimen

## 2016-05-16 NOTE — Telephone Encounter (Addendum)
Spoke with Aldona Bar at Dr. Lupita Leash office. Pt is to have oral surgery at the hospital under general anesthesia. Their office will need surgical clearance before this can be scheduled. Pt had an appointment with 05/03/2016 with JN.  JN - can you give clearance based on this appointment or will the pt need to be seen again? Thanks.  **Letter will need to be faxed to [phone]**

## 2016-05-16 NOTE — Assessment & Plan Note (Signed)
Cont on O2

## 2016-05-16 NOTE — Telephone Encounter (Signed)
"  Surgical Clearance" does not exist. I dictated a letter labeling her with a high risk of perioperative complications. Just look for it in Epic. Thanks.

## 2016-05-16 NOTE — Assessment & Plan Note (Signed)
Stable cont on current regimen  

## 2016-05-16 NOTE — Progress Notes (Signed)
Note reviewed.  Sonia Baller Ashok Cordia, M.D. [facility] Pulmonary & Critical Care Pager:  [phone] After 3pm or if no response, call [phone] 1:11 PM 05/16/16

## 2016-05-16 NOTE — Addendum Note (Signed)
Addended by: Parke Poisson E on: 05/16/2016 12:47 PM   Modules accepted: Orders

## 2016-05-16 NOTE — Patient Instructions (Signed)
Begin Zyrtec 52m daily .  Saline nasal rinses As needed   Continue on QVAR 2 puffs Twice daily   Remain Prednisone 121mdaily .  Continue with Nexium and Zantac.  GERD diet .  Continue on Oxygen 2l/m rest and 3l/m walking .  follow up Dr. NeAshok Cordian 2 months and As needed   Please contact office for sooner follow up if symptoms do not improve or worsen or seek emergency care

## 2016-05-16 NOTE — Assessment & Plan Note (Signed)
Recent flare now resolved  Cont current regimen

## 2016-05-16 NOTE — Progress Notes (Signed)
_0  ID: Erin Zavala, female    DOB: 04/11/1965, 51 y.o.   MRN: [id number]  Chief Complaint  Patient presents with  . Follow-up    bronchitis     Referring provider: No ref. provider found  HPI: 51 yo female followed for Sarcoid, chronic hypoxic respiratory failure, moderate pulmonary hypertension  05/16/2016 Follow up ; Bronchitis  Patient returns for a two-week follow-up. Patient was seen last visit for an acute bronchitis. She was treated with prednisone burst. Chest x-ray last visit showed chronic changes without any acute infiltrate. She is feeling a lot better. Still has sinus drainage and intermittent reflux  reflux .  Remains on Nexium and Zantac.  Remains on prednisone 50m daily   She remains on 2 L of oxygen. At rest 3 L with activity.  Patient does have moderate pulmonary hypertension and she is maintained on Adcirca and Mecitentan.     Allergies  Allergen Reactions  . No Known Allergies     Immunization History  Administered Date(s) Administered  . Influenza,inj,Quad PF,36+ Mos 02/28/2016  . PPD Test 02/28/2016  . Pneumococcal Polysaccharide-23 11/03/2015    Past Medical History:  Diagnosis Date  . Asthma    as a child  . Cough   . Dyspnea   . GERD (gastroesophageal reflux disease)   . Migraines   . Oxygen dependent   . Pulmonary hypertension   [place]     Tobacco History: History  Smoking Status  . Former Smoker  . Quit date: 01/11/2011  Smokeless Tobacco  . Never Used    Comment: former occasional social smoker   Counseling given: Not Answered   Outpatient Encounter Prescriptions as of 05/16/2016  Medication Sig  . aspirin 81 MG tablet Take 1 tablet (81 mg total) by mouth daily.  . beclomethasone (QVAR) 80 MCG/ACT inhaler Inhale 2 puffs into the lungs 2 (two) times daily.  .Marland Kitchendextromethorphan-guaiFENesin (MUCINEX DM) 30-600 MG 12hr tablet Take 1 tablet by mouth 2 (two) times daily as needed for cough.  . esomeprazole  (NEXIUM) 40 MG capsule Take 1 capsule (40 mg total) by mouth daily at 12 noon.  . feeding supplement, ENSURE ENLIVE, (ENSURE ENLIVE) LIQD Take 237 mLs by mouth 3 (three) times daily between meals.  . Macitentan (OPSUMIT) 10 MG TABS Take 1 tablet by mouth daily.  . predniSONE (DELTASONE) 10 MG tablet 1 tablet by mouth daily  . ranitidine (ZANTAC) 150 MG tablet Take 1 tablet (150 mg total) by mouth at bedtime.  . sodium chloride (OCEAN) 0.65 % SOLN nasal spray Place 1 spray into both nostrils as needed for congestion.  . tadalafil, PAH, (ADCIRCA) 20 MG tablet Take 2 tablets (40 mg total) by mouth daily.  . [DISCONTINUED] ambrisentan (LETAIRIS) 10 MG tablet Take 1 tablet (10 mg total) by mouth daily. (Patient not taking: Reported on 05/03/2016)  . [DISCONTINUED] benzonatate (TESSALON) 100 MG capsule Take 1 capsule (100 mg total) by mouth 3 (three) times daily as needed for cough. (Patient not taking: Reported on 05/16/2016)  . [DISCONTINUED] doxycycline (VIBRA-TABS) 100 MG tablet Take 1 tablet (100 mg total) by mouth 2 (two) times daily. (Patient not taking: Reported on 05/03/2016)  . [DISCONTINUED] predniSONE (DELTASONE) 20 MG tablet Take 1 tablet (20 mg total) by mouth daily with breakfast. (Patient not taking: Reported on 05/16/2016)   No facility-administered encounter medications on file as of 05/16/2016.      Review of Systems  Constitutional:   No  weight loss, night sweats,  Fevers, chills, fatigue, or  lassitude.  HEENT:   No headaches,  Difficulty swallowing,  Tooth/dental problems, or  Sore throat,                No sneezing, itching, ear ache,  +nasal congestion, post nasal drip,   CV:  No chest pain,  Orthopnea, PND, swelling in lower extremities, anasarca, dizziness, palpitations, syncope.   GI  No  abdominal pain, nausea, vomiting, diarrhea, change in bowel habits, loss of appetite, bloody stools.   Resp: No shortness of breath with exertion or at rest.  No excess mucus, no productive  cough,  No non-productive cough,  No coughing up of blood.  No change in color of mucus.  No wheezing.  No chest wall deformity  Skin: no rash or lesions.  GU: no dysuria, change in color of urine, no urgency or frequency.  No flank pain, no hematuria   MS:  No joint pain or swelling.  No decreased range of motion.  No back pain.    Physical Exam  BP 102/62 (BP Location: Left Arm, Cuff Size: Normal)   Pulse 85   Ht 5' (1.524 m)   Wt 131 lb 6.4 oz (59.6 kg)   LMP 05/01/2016 (Approximate)   SpO2 97%   BMI 25.66 kg/m   GEN: A/Ox3; pleasant , NAD , chronically ill appearing on O2    HEENT:  [place]/AT,  EACs-clear, TMs-wnl, NOSE-clear drainage , THROAT-clear, no lesions, no postnasal drip or exudate noted.   NECK:  Supple w/ fair ROM; no JVD; normal carotid impulses w/o bruits; no thyromegaly or nodules palpated; no lymphadenopathy.    RESP  Decreased BS in bases ,  no accessory muscle use, no dullness to percussion  CARD:  RRR, no m/r/g, no peripheral edema, pulses intact, no cyanosis or clubbing.  GI:   Soft & nt; nml bowel sounds; no organomegaly or masses detected.   Musco: Warm bil, no deformities or joint swelling noted.   Neuro: alert, no focal deficits noted.    Skin: Warm, no lesions or rashes    Lab Results:  CBC  BNP No results found for: BNP  ProBNP No results found for: PROBNP  Imaging: Dg Chest 2 View  Result Date: 05/04/2016 CLINICAL DATA:  Cough and congestion. EXAM: CHEST  2 VIEW COMPARISON:  01/16/2016 .  11/01/2015. FINDINGS: Stable cardiomegaly. Unchanged diffuse bilateral interstitial prominence consistent with chronic interstitial lung disease. No focal alveolar infiltrate. No pleural effusion or pneumothorax. Thoracic spine degenerative changes scoliosis . IMPRESSION: 1. Stable prominent bilateral interstitial prominence consistent chronic interstitial lung disease. 2. Stable cardiomegaly . Electronically Signed   By: Marcello Moores  Register   On:  05/04/2016 08:02     Assessment & Plan:   Acute bronchitis Recent flare now resolved  Cont current regimen   Hypoxemia Cont on O2   Pulmonary hypertension Stable cont on current regimen   Pulmonary sarcoidosis (HCC) Stable on current regimen    GERD (gastroesophageal reflux disease) GERD diet .  Cont on Nexium and Zantac.      Rexene Edison, NP 05/16/2016

## 2016-05-16 NOTE — Assessment & Plan Note (Signed)
GERD diet .  Cont on Nexium and Zantac.

## 2016-05-17 ENCOUNTER — Encounter (HOSPITAL_COMMUNITY): Payer: Self-pay | Admitting: *Deleted

## 2016-05-17 NOTE — Progress Notes (Signed)
I called Erin Zavala again today. I did not get a answer but left her a message to call the department.I am trying to see if she is still interested in attending the Pulmonary Maintenance Program,since she didn't show up for her 05/10/16 appointment.

## 2016-05-18 ENCOUNTER — Ambulatory Visit (HOSPITAL_COMMUNITY)
Admission: RE | Admit: 2016-05-18 | Discharge: 2016-05-18 | Disposition: A | Discharge status: Home / Self Care | Payer: Medicaid Other | Source: Ambulatory Visit | Attending: Cardiology | Admitting: Cardiology

## 2016-05-18 VITALS — BP 100/64 | HR 97 | Wt 132.0 lb

## 2016-05-18 DIAGNOSIS — Z8249 Family history of ischemic heart disease and other diseases of the circulatory system: Secondary | ICD-10-CM | POA: Insufficient documentation

## 2016-05-18 DIAGNOSIS — K219 Gastro-esophageal reflux disease without esophagitis: Secondary | ICD-10-CM | POA: Diagnosis not present

## 2016-05-18 DIAGNOSIS — I2721 Secondary pulmonary arterial hypertension: Secondary | ICD-10-CM | POA: Diagnosis not present

## 2016-05-18 DIAGNOSIS — Z7982 Long term (current) use of aspirin: Secondary | ICD-10-CM | POA: Insufficient documentation

## 2016-05-18 DIAGNOSIS — J9611 Chronic respiratory failure with hypoxia: Secondary | ICD-10-CM | POA: Diagnosis not present

## 2016-05-18 DIAGNOSIS — D860 Sarcoidosis of lung: Secondary | ICD-10-CM | POA: Diagnosis not present

## 2016-05-18 DIAGNOSIS — I272 Pulmonary hypertension, unspecified: Secondary | ICD-10-CM

## 2016-05-18 DIAGNOSIS — Z87891 Personal history of nicotine dependence: Secondary | ICD-10-CM | POA: Diagnosis not present

## 2016-05-18 DIAGNOSIS — D869 Sarcoidosis, unspecified: Secondary | ICD-10-CM | POA: Insufficient documentation

## 2016-05-18 DIAGNOSIS — Z9981 Dependence on supplemental oxygen: Secondary | ICD-10-CM | POA: Insufficient documentation

## 2016-05-18 LAB — BASIC METABOLIC PANEL
Anion gap: 8 (ref 5–15)
BUN: 14 mg/dL (ref 6–20)
CHLORIDE: 104 mmol/L (ref 101–111)
CO2: 25 mmol/L (ref 22–32)
Calcium: 9.3 mg/dL (ref 8.9–10.3)
Creatinine, Ser: 0.83 mg/dL (ref 0.44–1.00)
GFR calc Af Amer: >60 mL/min (ref >60)
GFR calc non Af Amer: >60 mL/min (ref >60)
GLUCOSE: 84 mg/dL (ref 65–99)
POTASSIUM: 4.1 mmol/L (ref 3.5–5.1)
Sodium: 137 mmol/L (ref 135–145)

## 2016-05-18 LAB — BRAIN NATRIURETIC PEPTIDE: B NATRIURETIC PEPTIDE 5: 16.5 pg/mL (ref 0.0–100.0)

## 2016-05-18 MED ORDER — AMBRISENTAN 10 MG PO TABS
10.0000 mg | ORAL_TABLET | Freq: Every day | ORAL | Status: DC
Start: 2016-05-18 — End: 2016-10-10

## 2016-05-18 NOTE — Patient Instructions (Addendum)
STOP Opsumit.  START Letairis 10 mg once daily. Take 2 sample tablets once daily. [facility] will contact you for shipment of medication.  6 minute walk today 840 ft (256 meters). Recommendation to increase oxygen while ambulating to 4 liters per minute.  Routine lab work today. Will notify you of abnormal results, otherwise no news is good news!  Follow up 6-8 weeks with Dr. [name].  Do the following things EVERYDAY: 1) Weigh yourself in the morning before breakfast. Write it down and keep it in a log. 2) Take your medicines as prescribed 3) Eat low salt foods-Limit salt (sodium) to 2000 mg per day.  4) Stay as active as you can everyday 5) Limit all fluids for the day to less than 2 liters

## 2016-05-20 NOTE — Progress Notes (Signed)
Pulmonology: Dr. Ashok Cordia Cardiology: Dr. [name]  51 yo with sarcoidosis, chronic hypoxemic respiratory failure on home oxygen, and pulmonary hypertension presents for cardiology evaluation.  She was diagnosed with ocular sarcoidosis in 1997. It seems like she did not have significant lung complications (that were recognized at least) until early 2017.  She now requires home oxygen.  CTA chest shows changes consistent with sarcoidosis, this has been confirmed by biopsy.  Echo in 8/17 had evidence for RV strain and elevated PA pressure.  Carey in 10/17 confirmed pulmonary arterial hypertension with normal right and left heart filling pressures.    She has started on macitentan followed by Adcirca.  Overall, she feels like breathing is better on pulmonary hypertension meds. Her 6 minute walk distance today was less but she is also recovering from acute bronchitis. She is short of breath walking up a flight of steps, but she can climb the whole flight without stopping (says this is better on PH meds).  No dyspnea walking on flat ground.  No lightheadedness/syncope. No chest pain.    Labs (9/17): NA negative, ESR 112, anti-dsDNA negative, anti-CCP negative, RF < 14.  ACE 136.  Labs (10/17): K 4.1, creatinine 0.8 Labs (11/17): K 3.8, creatinine 0.91  ECG (10/17): NSR, normal  6 minute walk (10/17): 305 m.  6 minute walk (1/18): 305 m 6 minute walk (3/18): 270 m  PMH: 1. GERD 2. Pulmonary hypertension: Suspect Group 5 PH related to sarcoidosis.  - CTA chest (8/17) with no PE but mediastinal/hilar LAN, cystic lung changes, subpleural consolidation, patchy ground glass.  - Echo (8/17): EF 55-60%, D-shaped interventricular septum, PASP 60 mmHg. - ANA negative, ESR 112, anti-dsDNA negative, anti-CCP negative, RF < 14.  - RHC (10/17): mean RA 3, PA 59/22 mean 36, mean PCWP 5, CI 2.92, PVR 7.4 WU.  - PFTs (10/17): Restrictive.  3. Sarcoidosis: Diagnosed with ocular sarcoidosis in 1997. Significant pulmonary  involvement noted in 8/17.  Lung biopsy positive.  4. Chronic hypoxemic respiratory failure: Home oxygen started this year.  5. Childhood asthma.  Social History   Social History  . Marital status: Married    Spouse name: N/A  . Number of children: N/A  . Years of education: N/A   Social History Main Topics  . Smoking status: Former Smoker    Quit date: 01/11/2011  . Smokeless tobacco: Never Used     Comment: former occasional social smoker  . Alcohol use No  . Drug use: No  . Sexual activity: Not on file   Other Topics Concern  . Not on file   Social History Narrative   Patient denies any bird or mold exposure. Currently works with exposure to inhaled perfumes and chemicals. 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 Previous exposure to inhaled dust while working in [employer]. Denies any IV drug use or previous incarceration. No known tuberculosis exposure. She has never lived nor volunteered at a homeless shelter.   Family History  Problem Relation Age of Onset  . Hypertension Mother   . Hypertension Father   . Rheumatologic disease Neg Hx    ROS: All systems reviewed and negative except as per HPI.   Current Outpatient Prescriptions  Medication Sig Dispense Refill  . aspirin 81 MG tablet Take 1 tablet (81 mg total) by mouth daily. 30 tablet 0  . beclomethasone (QVAR) 80 MCG/ACT inhaler Inhale 2 puffs into the lungs 2 (two) times daily. 1 Inhaler 6  .  dextromethorphan-guaiFENesin (MUCINEX DM) 30-600 MG 12hr tablet Take 1 tablet by mouth 2 (two) times daily as needed for cough. 14 tablet 0  . esomeprazole (NEXIUM) 40 MG capsule Take 1 capsule (40 mg total) by mouth daily at 12 noon. 30 capsule 3  . feeding supplement, ENSURE ENLIVE, (ENSURE ENLIVE) LIQD Take 237 mLs by mouth 3 (three) times daily between meals. 90 Bottle 0  . predniSONE (DELTASONE) 10 MG tablet 1 tablet by mouth daily 30 tablet 5  .  ranitidine (ZANTAC) 150 MG tablet Take 1 tablet (150 mg total) by mouth at bedtime. 30 tablet 3  . sodium chloride (OCEAN) 0.65 % SOLN nasal spray Place 1 spray into both nostrils as needed for congestion. 480 mL 0  . tadalafil, PAH, (ADCIRCA) 20 MG tablet Take 2 tablets (40 mg total) by mouth daily. 30 tablet 11  . ambrisentan (LETAIRIS) 10 MG tablet Take 1 tablet (10 mg total) by mouth daily.     No current facility-administered medications for this encounter.    BP 100/64 (BP Location: Right Arm, Patient Position: Sitting, Cuff Size: Normal)   Pulse 97   Wt 132 lb (59.9 kg)   LMP 05/01/2016 (Approximate)   SpO2 95% Comment: on 2L  BMI 25.78 kg/m  General: Thin, NAD Neck: No JVD, no thyromegaly or thyroid nodule.  Lungs: Clear to auscultation bilaterally with normal respiratory effort. CV: Nondisplaced PMI.  Heart regular S1/S2 (loud P2), no S3/S4, no murmur.  No ankle edema.  No carotid bruit.  Normal pedal pulses.  Abdomen: Soft, nontender, no hepatosplenomegaly, no distention.  Skin: Intact without lesions or rashes.  Neurologic: Alert and oriented x 3.  Psych: Normal affect. Extremities: No clubbing or cyanosis.  HEENT: Normal.   Assessment/Plan: 1. Sarcoidosis: Followed by Dr. Ashok Cordia.  Ocular and lung involvement, biopsy-proven.  She is on prednisone and home oxygen. She will be evaluated for lung transplant at [facility].  2. Pulmonary hypertension: Pulmonary arterial hypertension. Suspect Group 5 PH related to sarcoidosis. CTA chest showed no PE but did show changes of sarcoidosis.  PFTs were restrictive.  Echo showed RV strain and elevated PA pressure.  PAH was confirmed by RHC with PVR 7.4 WU.  Serologic workup was negative.  - 6 minute walk done today => less distance than prior but also recovering from acute bronchitis.  - Continue Adcirca. - She is not able to get coverage for macitentan but insurance will cover ambrisentan.  She will transition to ambrisentan 10 mg daily when  she runs out of the current macitentan.   - Will consider adding Selexipag down the road given improvement with the meds she's on so far.  - Check BNP.   Followup in 6 wks.     Loralie Champagne 05/20/2016

## 2016-05-24 ENCOUNTER — Telehealth (HOSPITAL_COMMUNITY): Payer: Self-pay | Admitting: General Practice

## 2016-05-24 NOTE — Telephone Encounter (Signed)
Verified Medicaid insurance benefit through [employer]. Reference [phone]... KJ

## 2016-05-28 ENCOUNTER — Other Ambulatory Visit (HOSPITAL_COMMUNITY): Payer: Self-pay | Admitting: Pharmacist

## 2016-05-28 MED ORDER — TADALAFIL (PAH) 20 MG PO TABS: 40.0000 mg | ORAL_TABLET | Freq: Every day | ORAL | 3 refills | Status: DC

## 2016-05-31 ENCOUNTER — Encounter (HOSPITAL_COMMUNITY): Payer: Self-pay | Admitting: *Deleted

## 2016-06-01 ENCOUNTER — Other Ambulatory Visit (HOSPITAL_COMMUNITY): Payer: Self-pay | Admitting: Pharmacist

## 2016-06-01 MED ORDER — TADALAFIL (PAH) 20 MG PO TABS: 40.0000 mg | ORAL_TABLET | Freq: Every day | ORAL | 3 refills | Status: DC

## 2016-06-07 ENCOUNTER — Encounter (HOSPITAL_COMMUNITY): Payer: Self-pay | Admitting: *Deleted

## 2016-06-07 NOTE — Progress Notes (Signed)
I placed a telephone call to Loetta 05/31/16 and ask her to call us back. This is my third call to ask her if she was going to participate in the Pulmonary Maintenance program. I am not going to contact her any further.

## 2016-06-11 ENCOUNTER — Encounter: Payer: Self-pay | Admitting: Internal Medicine

## 2016-06-11 ENCOUNTER — Ambulatory Visit (INDEPENDENT_AMBULATORY_CARE_PROVIDER_SITE_OTHER): Payer: Medicaid Other | Admitting: Internal Medicine

## 2016-06-11 VITALS — BP 114/65 | HR 100 | Temp 98.0°F | Ht 60.0 in | Wt 133.9 lb

## 2016-06-11 DIAGNOSIS — K219 Gastro-esophageal reflux disease without esophagitis: Secondary | ICD-10-CM | POA: Diagnosis not present

## 2016-06-11 DIAGNOSIS — I272 Pulmonary hypertension, unspecified: Secondary | ICD-10-CM

## 2016-06-11 DIAGNOSIS — Z9981 Dependence on supplemental oxygen: Secondary | ICD-10-CM

## 2016-06-11 DIAGNOSIS — Z8249 Family history of ischemic heart disease and other diseases of the circulatory system: Secondary | ICD-10-CM | POA: Diagnosis not present

## 2016-06-11 DIAGNOSIS — D860 Sarcoidosis of lung: Secondary | ICD-10-CM | POA: Diagnosis not present

## 2016-06-11 DIAGNOSIS — J9611 Chronic respiratory failure with hypoxia: Secondary | ICD-10-CM | POA: Diagnosis not present

## 2016-06-11 DIAGNOSIS — Z87891 Personal history of nicotine dependence: Secondary | ICD-10-CM | POA: Diagnosis not present

## 2016-06-11 DIAGNOSIS — I2729 Other secondary pulmonary hypertension: Secondary | ICD-10-CM | POA: Diagnosis not present

## 2016-06-11 DIAGNOSIS — Z0000 Encounter for general adult medical examination without abnormal findings: Secondary | ICD-10-CM | POA: Insufficient documentation

## 2016-06-11 NOTE — Patient Instructions (Addendum)
Mr. Hofstra,   It was a pleasure meeting you today. Please do not hesitate to call the clinic if you have an problems. We can usually see people for same day appointments.   Please follow up with in in 6 months to a year or earlier if needed.

## 2016-06-11 NOTE — Assessment & Plan Note (Signed)
Reports symptoms are well controlled on Nexium and Zantac.  Assessment: GERD  Plan: Continue current medications

## 2016-06-11 NOTE — Assessment & Plan Note (Signed)
Ms. Erin Zavala has a history of sarcoidosis. She was first diagnosed with ocular sarcoidosis in 1997. It appears she had limited symptoms at that time which resolved and by 1999 she was off medications and was lost to follow up. She did not have any pulmonary symptoms until the summer of 2016 when she noted a dry cough and some trouble breathing with exertion at times. Her symptoms slowly progressed and by April 2017 she was having labored breathing, shortness of breath with exertion to the point it was interfering with her work. Started having difficulty walking up stairs. By August, she had to be admitted to the hospital for dyspnea. CTA chest was done which showed changed consistent with sarcoidosis and was subsequently confirmed with biopsy. Her ECHO 10/2015 showed evidence of RV strain and elevated PA pressures. She subsequently underwent a RHC 12/2015 which confirmed PAH thought to be secondary to her pulmonary sarcoidosis.  She has been O2 dependant since 10/2015. Currently on 2L O2 at rest and 4L with exertion. She was on 3L previously but had recent episode of bronchitis and this was increased at that time. She takes Qvar bid and prednisone 10 mg daily.   Today, she has no complaints. Her shortness of breath is at her baseline, cough is stable with scant mucous production, no fevers or chills. She was recently referred to [facility] for evaluation for possible lung transplant and has her initial evaluation next week on 4/9.   Maternal aunt has history of sarcoidosis.   Assessment: Pulmonary Sarcoidosis  Plan: Continue current regimen

## 2016-06-11 NOTE — Assessment & Plan Note (Signed)
Reports PAP smear done last year by Dr. Modena Nunnery in [place], [place]. Reports it was a 'good report.' Attempting to get records.   Has not had a mammogram in several years. Has never had a colonoscopy. Reports receiving PNA vaccine in August.   Will need further addressing of health care maintenance in the future, will defer until decision is made regarding her lung transplant.

## 2016-06-11 NOTE — Progress Notes (Signed)
CC: Establish care for sarcoidosis  HPI:  Ms.Erin Zavala is a 51 y.o. female with a past medical history of sarcoidosis, pulmonary HTN, chronic hypoxic respiratory failure on home O2, migraines, GERD presenting to clinic today to establish care for her Sarcoidosis.   For details of today's visit and the status of her chronic medical issues please refer to the assessment and plan.   Past Medical History:  Diagnosis Date  . Asthma    as a child  . Cough   . Dyspnea   . GERD (gastroesophageal reflux disease)   . Migraines   . Oxygen dependent   . Pulmonary hypertension   . Sarcoidosis [facility])    Past Surgical History:  Procedure Laterality Date  . CARDIAC CATHETERIZATION N/A 12/12/2015   Procedure: Right Heart Cath;  Surgeon: Larey Dresser, MD;  Location: [place] CV LAB;  Service: Cardiovascular;  Laterality: N/A;  . MULTIPLE TOOTH EXTRACTIONS  01/13/16   all teeth will be removed  . tuballigation    . VIDEO BRONCHOSCOPY WITH ENDOBRONCHIAL ULTRASOUND N/A 01/16/2016   Procedure: VIDEO BRONCHOSCOPY WITH ENDOBRONCHIAL ULTRASOUND;  Surgeon: Melrose Nakayama, MD;  Location: [facility] OR;  Service: Thoracic;  Laterality: N/A;   Family History  Problem Relation Age of Onset  . Hypertension Mother   . Hypertension Father   . Rheumatologic disease Neg Hx    Social History   Social History  . Marital status: Married    Spouse name: N/A  . Number of children: N/A  . Years of education: N/A   Social History Main Topics  . Smoking status: Former Smoker    Quit date: 01/11/2011  . Smokeless tobacco: Never Used     Comment: former occasional social smoker  . Alcohol use No  . Drug use: No  . Sexual activity: Not Asked   Other Topics Concern  . None   Social History Narrative   Patient denies any bird or mold exposure. Currently works with exposure to inhaled perfumes and chemicals. 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 Previous exposure to inhaled dust while working in [employer]. Denies any IV drug use or previous incarceration. No known tuberculosis exposure. She has never lived nor volunteered at a homeless shelter.   Allergies  Allergen Reactions  . No Known Allergies    Review of Systems:   Review of Systems  Constitutional: Negative for chills, fever and weight loss.  HENT: Positive for congestion. Negative for ear pain, hearing loss, sinus pain and sore throat.   Eyes: Negative for blurred vision, double vision and discharge.  Respiratory: Positive for cough and shortness of breath. Negative for hemoptysis, sputum production and wheezing.   Cardiovascular: Negative for chest pain, palpitations, orthopnea, leg swelling and PND.  Gastrointestinal: Positive for heartburn. Negative for abdominal pain, constipation, diarrhea, nausea and vomiting.  Genitourinary: Negative for dysuria, frequency and urgency.  Musculoskeletal: Negative for falls and myalgias.  Skin: Negative for itching and rash.  Neurological: Negative for dizziness and headaches.  Psychiatric/Behavioral: Negative for depression. The patient is not nervous/anxious and does not have insomnia.     Physical Exam:  Vitals:   06/11/16 1022  BP: 114/65  Pulse: 100  Temp: 98 F (36.7 C)  TempSrc: Oral  Weight: 133 lb 14.4 oz (60.7 kg)  Height: 5' (1.524 m)   Physical Exam  Constitutional: She is oriented to person, place, and time and well-developed, well-nourished, and in no distress. No  distress.  HENT:  Head: Normocephalic and atraumatic.  Nose: Nose normal.  Mouth/Throat: Oropharynx is clear and moist. No oropharyngeal exudate.  Eyes: Conjunctivae and EOM are normal. Pupils are equal, round, and reactive to light.  Neck: Normal range of motion. No JVD present.  Cardiovascular: Normal rate, regular rhythm, normal heart sounds and intact distal pulses.   Pulmonary/Chest: Effort normal. No accessory muscle usage.  She has decreased breath sounds in the right lower field and the left lower field. She has no wheezes. She has rhonchi. She has no rales.  Abdominal: Soft. Bowel sounds are normal. She exhibits no distension. There is no tenderness.  Musculoskeletal: Normal range of motion. She exhibits no edema.  Lymphadenopathy:    She has no cervical adenopathy.  Neurological: She is alert and oriented to person, place, and time. She has normal reflexes.  Skin: Skin is warm and dry. No rash noted.  Psychiatric: Mood and affect normal.   Assessment & Plan:   See Encounters Tab for problem based charting.  Patient discussed with Dr. Angelia Mould

## 2016-06-11 NOTE — Assessment & Plan Note (Addendum)
Her ECHO 10/2015 showed evidence of RV strain and elevated PA pressures. She subsequently underwent a RHC 12/2015 which confirmed PAH thought to be secondary to her pulmonary sarcoidosis.  She has been O2 dependant since 10/2015. She is currently on Tadalafil 40 mg daily. She has been on macitentan but could not get insurance coverage and is being transitioned to ambrisentan 10 mg daily when she runs out of the macitentan.   She has no complaints today. Followed closely by cardiology.   Assessment: PAH  Plan: Continue current medications

## 2016-06-15 NOTE — Progress Notes (Signed)
Internal Medicine Clinic Attending  Case discussed with Dr. Charlynn Grimes at the time of the visit.  We reviewed the resident's history and exam and pertinent patient test results.  I agree with the assessment, diagnosis, and plan of care documented in the resident's note.

## 2016-07-03 ENCOUNTER — Encounter (HOSPITAL_COMMUNITY)
Admission: RE | Admit: 2016-07-03 | Discharge: 2016-07-03 | Disposition: A | Discharge status: Home / Self Care | Payer: Self-pay | Source: Ambulatory Visit

## 2016-07-03 NOTE — Progress Notes (Signed)
Erin Zavala did not show for her appointment for the orientation to the Pulmonary Maintenance Program. This is the second appointment that she has not shown up for. She also did not call.

## 2016-07-03 NOTE — Progress Notes (Deleted)
Erin Zavala did not show up

## 2016-07-05 ENCOUNTER — Encounter (HOSPITAL_COMMUNITY): Payer: Self-pay

## 2016-07-10 ENCOUNTER — Encounter (HOSPITAL_COMMUNITY)
Admission: RE | Admit: 2016-07-10 | Discharge: 2016-07-10 | Disposition: A | Discharge status: Home / Self Care | Payer: Medicaid Other | Source: Ambulatory Visit | Attending: Pulmonary Disease | Admitting: Pulmonary Disease

## 2016-07-10 NOTE — Progress Notes (Signed)
Erin Zavala just showed up today without calling for Pulmonary Maintenance. Her scheduled appointment to start was 07/03/16. She arrived at [facility] when I ask her to be here for her orientation 4/24 at 0900. I completed her orientation paper work. We were unable to do her walk test due to her late arrival. We have ask her to come in 07/17/16 for her walk test. Erin Zavala agrees to this.

## 2016-07-12 ENCOUNTER — Encounter (HOSPITAL_COMMUNITY): Payer: Medicaid Other

## 2016-07-13 ENCOUNTER — Inpatient Hospital Stay (HOSPITAL_COMMUNITY): Admission: RE | Admit: 2016-07-13 | Payer: Medicaid Other | Source: Ambulatory Visit

## 2016-07-17 ENCOUNTER — Encounter (HOSPITAL_COMMUNITY): Payer: Medicaid Other

## 2016-07-17 ENCOUNTER — Telehealth (HOSPITAL_COMMUNITY): Payer: Self-pay | Admitting: General Practice

## 2016-07-17 ENCOUNTER — Encounter (HOSPITAL_COMMUNITY): Payer: Self-pay | Admitting: *Deleted

## 2016-07-17 NOTE — Progress Notes (Signed)
Erin Zavala called today. She was dischaged today from [facility] after having a coronary stent placed. She would now be more appropriate  for the Cardiac Rehab Phase 2 program. I called her back to tell her that we would proceed with getting a order for this from her cardiologist.

## 2016-07-17 NOTE — Telephone Encounter (Signed)
Pt called to cancel due to having a stent put in.... Erin Zavala

## 2016-07-19 ENCOUNTER — Encounter (HOSPITAL_COMMUNITY): Payer: Medicaid Other

## 2016-07-23 ENCOUNTER — Encounter: Payer: Self-pay | Admitting: Pulmonary Disease

## 2016-07-23 ENCOUNTER — Ambulatory Visit (INDEPENDENT_AMBULATORY_CARE_PROVIDER_SITE_OTHER): Payer: Medicaid Other | Admitting: Pulmonary Disease

## 2016-07-23 ENCOUNTER — Other Ambulatory Visit (INDEPENDENT_AMBULATORY_CARE_PROVIDER_SITE_OTHER): Payer: Medicaid Other

## 2016-07-23 VITALS — BP 98/60 | HR 103 | Ht 60.0 in | Wt 134.0 lb

## 2016-07-23 DIAGNOSIS — I2729 Other secondary pulmonary hypertension: Secondary | ICD-10-CM

## 2016-07-23 DIAGNOSIS — K219 Gastro-esophageal reflux disease without esophagitis: Secondary | ICD-10-CM

## 2016-07-23 DIAGNOSIS — D869 Sarcoidosis, unspecified: Secondary | ICD-10-CM | POA: Diagnosis not present

## 2016-07-23 DIAGNOSIS — I2510 Atherosclerotic heart disease of native coronary artery without angina pectoris: Secondary | ICD-10-CM | POA: Insufficient documentation

## 2016-07-23 DIAGNOSIS — J9611 Chronic respiratory failure with hypoxia: Secondary | ICD-10-CM

## 2016-07-23 LAB — CBC WITH DIFFERENTIAL/PLATELET
BASOS PCT: 0.4 % (ref 0.0–3.0)
Basophils Absolute: 0 10*3/uL (ref 0.0–0.1)
EOS PCT: 0.7 % (ref 0.0–5.0)
Eosinophils Absolute: 0.1 10*3/uL (ref 0.0–0.7)
HCT: 32.7 % — ABNORMAL LOW (ref 36.0–46.0)
HEMOGLOBIN: 10.4 g/dL — AB (ref 12.0–15.0)
Lymphocytes Relative: 7.2 % — ABNORMAL LOW (ref 12.0–46.0)
Lymphs Abs: 0.8 10*3/uL (ref 0.7–4.0)
MCHC: 31.7 g/dL (ref 30.0–36.0)
MCV: 81.5 fl (ref 78.0–100.0)
MONO ABS: 0.3 10*3/uL (ref 0.1–1.0)
Monocytes Relative: 2.4 % — ABNORMAL LOW (ref 3.0–12.0)
NEUTROS ABS: 10.2 10*3/uL — AB (ref 1.4–7.7)
Neutrophils Relative %: 89.3 % — ABNORMAL HIGH (ref 43.0–77.0)
PLATELETS: 540 10*3/uL — AB (ref 150.0–400.0)
RBC: 4.02 Mil/uL (ref 3.87–5.11)
RDW: 16.9 % — AB (ref 11.5–15.5)
WBC: 11.4 10*3/uL — AB (ref 4.0–10.5)

## 2016-07-23 LAB — COMPREHENSIVE METABOLIC PANEL
ALBUMIN: 3.9 g/dL (ref 3.5–5.2)
ALT: 14 U/L (ref 0–35)
AST: 19 U/L (ref 0–37)
Alkaline Phosphatase: 63 U/L (ref 39–117)
BUN: 10 mg/dL (ref 6–23)
CALCIUM: 9.2 mg/dL (ref 8.4–10.5)
CHLORIDE: 104 meq/L (ref 96–112)
CO2: 27 meq/L (ref 19–32)
Creatinine, Ser: 0.74 mg/dL (ref 0.40–1.20)
GFR: 106.69 mL/min (ref >60.00)
Glucose, Bld: 103 mg/dL — ABNORMAL HIGH (ref 70–99)
POTASSIUM: 4.6 meq/L (ref 3.5–5.1)
SODIUM: 138 meq/L (ref 135–145)
Total Bilirubin: 0.4 mg/dL (ref 0.2–1.2)
Total Protein: 7.5 g/dL (ref 6.0–8.3)

## 2016-07-23 MED ORDER — PREDNISONE 2.5 MG PO TABS: ORAL_TABLET | ORAL | 0 refills | Status: DC

## 2016-07-23 NOTE — Progress Notes (Signed)
Subjective:    Patient ID: Erin Zavala, female    DOB: [birth year], 51 y.o.   MRN: [id number]  C.C.:  Follow-up for Sarcoidosis, Chronic Hypoxic Respiratory Failure, Moderate Pulmonary Hypertension, & GERD.  HPI  During her workup for lung transplant she was found to have a coronary artery blockage and underwent a stent last Monday. She was started on Plavix in addition to her Plavix.   Sarcoidosis: Biopsy confirmed 01/16/16. Currently undergoing lung transplant evaluation at [facility]. Currently on basal dose of prednisone 10 mg daily & Qvar 160 g inhaled twice a day. She feels like her breathing is doing "fine". No palpitations or syncope. No change in her baseline cough & wheezing with exertion. No rashes or bruising.  She is transitioning from Pulmonary to Cardiac Rehab with her recent stent.   Chronic hypoxic respiratory failure: Previously prescribed oxygen at 3 L/m at rest and 4 L/m with exertion during acute illness at last appointment. She has been using oxygen as prescribed. She has weaned herself back to 2 L/m and is monitoring her saturation with normal activity.   Monitor pulmonary hypertension: Confirmed with right heart catheterization 12/12/15. Following with cardiology. Currently on [employer]. No lower extremity edema.   GERD: Currently prescribed Nexium and Zantac 150 mg daily at bedtime. No reflux or dyspepsia. She reports she has been using her Nexium intermittently but has continued using Zantac regularly. No morning brash water taste.   Review of Systems She is having more sinus drainage. No sinus congestion or pressure. No chest pain, pressure, or tightness. No fever or chills.   Allergies  Allergen Reactions  . No Known Allergies     Current Outpatient Prescriptions on File Prior to Visit  Medication Sig Dispense Refill  . ambrisentan (LETAIRIS) 10 MG tablet Take 1 tablet (10 mg total) by mouth daily.    [name] aspirin 81 MG tablet Take 1 tablet (81 mg total) by  mouth daily. 30 tablet 0  . beclomethasone (QVAR) 80 MCG/ACT inhaler Inhale 2 puffs into the lungs 2 (two) times daily. 1 Inhaler 6  . dextromethorphan-guaiFENesin (MUCINEX DM) 30-600 MG 12hr tablet Take 1 tablet by mouth 2 (two) times daily as needed for cough. 14 tablet 0  . esomeprazole (NEXIUM) 40 MG capsule Take 1 capsule (40 mg total) by mouth daily at 12 noon. 30 capsule 3  . feeding supplement, ENSURE ENLIVE, (ENSURE ENLIVE) LIQD Take 237 mLs by mouth 3 (three) times daily between meals. 90 Bottle 0  . predniSONE (DELTASONE) 10 MG tablet 1 tablet by mouth daily 30 tablet 5  . ranitidine (ZANTAC) 150 MG tablet Take 1 tablet (150 mg total) by mouth at bedtime. 30 tablet 3  . sodium chloride (OCEAN) 0.65 % SOLN nasal spray Place 1 spray into both nostrils as needed for congestion. 480 mL 0  . tadalafil, PAH, (ADCIRCA) 20 MG tablet Take 2 tablets (40 mg total) by mouth daily. 180 tablet 3   No current facility-administered medications on file prior to visit.     Past Medical History:  Diagnosis Date  . Asthma    as a child  . Cough   . Dyspnea   . GERD (gastroesophageal reflux disease)   . Migraines   . Oxygen dependent   . Pulmonary hypertension ([place])   . Sarcoidosis     Past Surgical History:  Procedure Laterality Date  . CARDIAC CATHETERIZATION N/A 12/12/2015   Procedure: Right Heart Cath;  Surgeon: Larey Dresser, MD;  Location:  [facility] INVASIVE CV LAB;  Service: Cardiovascular;  Laterality: N/A;  . MULTIPLE TOOTH EXTRACTIONS  01/13/16   all teeth will be removed  . tuballigation    . VIDEO BRONCHOSCOPY WITH ENDOBRONCHIAL ULTRASOUND N/A 01/16/2016   Procedure: VIDEO BRONCHOSCOPY WITH ENDOBRONCHIAL ULTRASOUND;  Surgeon: Melrose Nakayama, MD;  Location: [facility] OR;  Service: Thoracic;  Laterality: N/A;    Family History  Problem Relation Age of Onset  . Hypertension Mother   . Hypertension Father   . Sarcoidosis Maternal Aunt   . Rheumatologic disease Neg Hx     Social  History   Social History  . Marital status: Married    Spouse name: N/A  . Number of children: N/A  . Years of education: N/A   Social History Main Topics  . Smoking status: Former Smoker    Packs/day: 0.25    Years: 10.00    Types: Cigarettes    Quit date: 01/11/2011  . Smokeless tobacco: Never Used  . Alcohol use No  . Drug use: No  . Sexual activity: Not Asked   Other Topics Concern  . None   Social History Narrative   Patient denies any bird or mold exposure. Currently works with exposure to inhaled perfumes and chemicals. 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 Previous exposure to inhaled dust while working in [employer]. Denies any IV drug use or previous incarceration. No known tuberculosis exposure. She has never lived nor volunteered at a homeless shelter.      Objective:   Physical Exam BP 98/60 (BP Location: Right Arm, Patient Position: Sitting, Cuff Size: Normal)   Pulse (!) 103   Ht 5' (1.524 m)   Wt 134 lb (60.8 kg)   SpO2 94%   BMI 26.17 kg/m   Gen.: Comfortable. Alert. No acute distress. Integument: No rash. No bruising. Warm. HEENT: No significant nasal turbinate swelling. Moist mucous membranes. No oral ulcers. Cardiovascular: Regular rate. Regular rhythm. No edema. Pulmonary: Clear with auscultation. No accessory muscle use on supplemental oxygen. Abdomen: Soft. Nondistended. Nontender. Neurological: Cranial nerves grossly intact. No meningismus. Strength grossly 5/5 and symmetric.  PFT 12/27/15: FVC 1.53 L (61%) FEV1 1.21 L (60%) FEV1/FVC 0.79 FEF 25-75 1.03 L (46%) negative bronchodilator response TLC 2.62 L (58%) RV 70% ERV 109% DLCO unreliable  6MWT 05/03/16:  Walked 320 meters / Baseline Sat 99% on  3 L/m / Nadir Sat 68% on  3 L/m @ end of test (required 4 L/m with exertion to maintain with exertion but desaturated with coughing) 12/28/15:  Walked 264 meters / Baseline Sat 100%  on 2 L/m / Nadir Sat 88% on 2 L/m (required 3 L/m to maintain saturation with exertion)  IMAGING CTA CHEST 11/01/15 (previously reviewed by me):No pulmonary emboli noted. No pleural effusion or thickening. Enlarged main pulmonary artery. No pericardial effusion appreciated. Bulky mediastinal and hilar lymphadenopathy. Bilateral upper lung cystic changes as well as subpleural consolidation within right lower lung and bilateral upper lobes. Bilateral patchy groundglass also noted.  CARDIAC RHC (12/12/15): RA Mean - 3 RV - 55/1 PA - 59/22 (36) PCWP - 5 PA Sat - 75% Aortic Sat - 100% CO - 4.2 CI - 2.92 PVR - 7.4 WU  48 HOUR HOLTER MONITOR (12/05/15):  Sinus rhythm with PVCs & PACs. HR 70-160.  TTE (11/02/15):LV normal in size with EF 55-60%.No wall motion abnormalities. Grade 1 diastolic dysfunction. LA &RA normal in size. RV normal in size and function.  Pulmonary artery systolic pressure 60 mmHg. Diastolic flattening and systolic flattening of the intraventricular septum. No aortic stenosis or regurgitation. Aortic root normal in size. Trivial mitral regurgitation without stenosis. No pulmonic stenosis. Mild tricuspid regurgitation. Trivial pericardial effusion.  PATHOLOGY BAL RUL (01/16/16):  No malignancy w/ granulomatous inflammation EBUS FNA Level 7 (01/16/16):  No malignancy & vague granulomatous inflammation Brushing RUL (01/16/16):  Vague granulomatous inflammation Transbronchial Biopsies RUL & Trachea (01/16/16):  Benign respiratory epthelium  MICROBIOLOGY BAL (01/16/16):  Bacteria negative / AFB negative / Fungus negative Quantiferon-TB 02/28/16:  Negative Skin PPD 03/02/16:  Negative (0 induration) Influenza DFA (05/03/16):  Negative   LABS 12/09/15 LDH:  370 CRP:  3.4 ESR:  112 ANA:  Negative ACE:  136 Anti-CCP:  <16 RF:  <14 Smith Ab:  <1.0 DS DNA Ab:  1 SSA:  <1.0 SSB:  <1.0 3/17 ACE:  120 ANA:  1:80 (speckled)    Assessment & Plan:  51 y.o. female with  sarcoidosis, chronic hypoxic respiratory failure, pulmonary hypertension, & GERD. Patient seems to have minimal symptoms from her underlying sarcoidosis. I feel it's reasonable to attempt to wean her off prednisone therapy while continuing inhaled corticosteroid therapy. She may require initiation of methotrexate if symptom control cannot be maintained. Overall she appears euvolemic and her reflux is well controlled. I instructed the patient to contact my office if she had any new breathing problems or questions before next appointment.  1. Sarcoidosis:  Initiating taper of prednisone by 2.5 mg every 4 weeks until patient reaches a dose of 5 mg daily and will remain on this until she sees me again. Repeat spirometry with DLCO and 6 minute walk test with oxygen titration at next appointment. Checking serum CMP, CBC, & ACE today. Lung transplant workup ongoing with [facility]. Continuing Qvar twice daily as prescribed. 2. Chronic hypoxic respiratory failure: Continuing on oxygen as previously prescribed. Repeat 6 minute walk test with oxygen titration at next appointment. 3. Pulmonary hypertension: Continuing on [place]. Followed by cardiology. Appears euvolemic.  4. GERD: Controlled with Zantac. Patient encouraged to use medications as prescribed. 5. Health maintenance: Status post influenza vaccine December 2017 & Pneumovax August 2017. Plan for Prevnar vaccine August 2018. 6. Follow-up: Return to clinic in 8 weeks.   Sonia Baller Ashok Cordia, M.D. [facility] Pulmonary [facility] Critical [facility] Pager:  [phone] After 3pm or if no response, call [phone] 2:59 PM 07/23/16

## 2016-07-23 NOTE — Addendum Note (Signed)
Addended by: Tyson Dense on: 07/23/2016 03:11 PM   Modules accepted: Orders

## 2016-07-23 NOTE — Patient Instructions (Signed)
   We are going to start a taper of your Prednisone with a new pill dose (2.68m). Take as follows:  Take 3 pills daily for 4 weeks, then   Take 2 pills daily until you see me again  Call me if you feel your breathing is getting worse or your cough gets worse.  I will see you back in 8 weeks to make sure you are doing ok.  Let me know if you have any questions.  TESTS ORDERED: 1. Spirometry with DLCO at next appointment 2. 6MWT with oxygen titration at next appointment 3. Serum ACE, CBC & CMP today

## 2016-07-24 ENCOUNTER — Encounter (HOSPITAL_COMMUNITY): Payer: Medicaid Other

## 2016-07-24 LAB — ANGIOTENSIN CONVERTING ENZYME: Angiotensin-Converting Enzyme: 72 U/L — ABNORMAL HIGH (ref 9–67)

## 2016-07-26 ENCOUNTER — Encounter (HOSPITAL_COMMUNITY): Admission: RE | Admit: 2016-07-26 | Payer: Medicaid Other | Source: Ambulatory Visit

## 2016-07-31 ENCOUNTER — Encounter (HOSPITAL_COMMUNITY): Payer: Medicaid Other

## 2016-08-02 ENCOUNTER — Encounter (HOSPITAL_COMMUNITY): Payer: Medicaid Other

## 2016-08-07 ENCOUNTER — Encounter (HOSPITAL_COMMUNITY): Payer: Medicaid Other

## 2016-08-09 ENCOUNTER — Encounter (HOSPITAL_COMMUNITY): Payer: Medicaid Other

## 2016-08-11 ENCOUNTER — Other Ambulatory Visit: Payer: Self-pay | Admitting: Pulmonary Disease

## 2016-08-13 ENCOUNTER — Other Ambulatory Visit (HOSPITAL_COMMUNITY): Payer: Self-pay | Admitting: Cardiology

## 2016-08-13 MED ORDER — TADALAFIL (PAH) 20 MG PO TABS: 40.0000 mg | ORAL_TABLET | Freq: Every day | ORAL | 3 refills | Status: DC

## 2016-08-14 ENCOUNTER — Encounter (HOSPITAL_COMMUNITY): Payer: Medicaid Other

## 2016-08-16 ENCOUNTER — Encounter (HOSPITAL_COMMUNITY): Payer: Medicaid Other

## 2016-08-21 ENCOUNTER — Encounter (HOSPITAL_COMMUNITY): Payer: Medicaid Other

## 2016-08-23 ENCOUNTER — Encounter (HOSPITAL_COMMUNITY): Payer: Medicaid Other

## 2016-08-23 ENCOUNTER — Telehealth: Payer: Self-pay | Admitting: Pulmonary Disease

## 2016-08-23 ENCOUNTER — Ambulatory Visit (INDEPENDENT_AMBULATORY_CARE_PROVIDER_SITE_OTHER): Payer: Medicaid Other | Admitting: Pulmonary Disease

## 2016-08-23 DIAGNOSIS — D869 Sarcoidosis, unspecified: Secondary | ICD-10-CM | POA: Diagnosis not present

## 2016-08-23 LAB — PULMONARY FUNCTION TEST
DL/VA % PRED: 54 %
DL/VA: 2.3 ml/min/mmHg/L
DLCO COR: 5.7 ml/min/mmHg
DLCO UNC % PRED: 31 %
DLCO cor % pred: 30 %
DLCO unc: 5.96 ml/min/mmHg
FEF 25-75 PRE: 1.99 L/s
FEF2575-%PRED-PRE: 92 %
FEV1-%PRED-PRE: 75 %
FEV1-Pre: 1.49 L
FEV1FVC-%Pred-Pre: 107 %
FEV6-%PRED-PRE: 71 %
FEV6-PRE: 1.7 L
FEV6FVC-%PRED-PRE: 102 %
FVC-%PRED-PRE: 69 %
FVC-Pre: 1.7 L
PRE FEV6/FVC RATIO: 100 %
Pre FEV1/FVC ratio: 87 %

## 2016-08-23 NOTE — Telephone Encounter (Signed)
Attempted to contact pt. No answer, no option to leave a message. Will try back.

## 2016-08-23 NOTE — Telephone Encounter (Signed)
Form has been placed in JN's look at. Will route message to [facility] for follow up.

## 2016-08-23 NOTE — Progress Notes (Signed)
PFT done today. 

## 2016-08-24 NOTE — Telephone Encounter (Signed)
Pt returned phone call, pt contact # [phone].Marland Kitchenert

## 2016-08-24 NOTE — Telephone Encounter (Signed)
Pt requesting a referral to the [facility] at the [facility] in [place].   Pt also asking about her Handicapped Placard--aware that this is in his look at to be signed.   Please advise Dr Ashok Cordia. Thanks.

## 2016-08-24 NOTE — Telephone Encounter (Signed)
She should be doing Pulmonary Rehab not [facility] [employer]. I will sign placard when I'm in office next week. J.

## 2016-08-24 NOTE — Telephone Encounter (Signed)
Was told by Erin Zavala that was she being discharged on May 17th because of the stent that was placed on 07/16/16 by [facility].   Since she is not doing the pulm. Rehab program, she was interested in doing the [employer] program. She said she was advised to get permission from her doctor before starting.   JN, please advise. Thanks.

## 2016-08-24 NOTE — Telephone Encounter (Signed)
Left message for patient to call back

## 2016-08-24 NOTE — Telephone Encounter (Signed)
She was doing cardiac rehab after the stent placement. We can probably get her in under her pulmonary diagnosis. Let me know.

## 2016-08-24 NOTE — Telephone Encounter (Signed)
Spoke with the pt and notified form placed in JN's lookat to be signed  I advised we will call her once it's ready  She verbalized understanding

## 2016-08-27 NOTE — Telephone Encounter (Signed)
Erin Zavala will be back in the office on Wednesday.  He will address at that time.

## 2016-08-28 ENCOUNTER — Encounter (HOSPITAL_COMMUNITY): Payer: Medicaid Other

## 2016-08-29 NOTE — Telephone Encounter (Signed)
JN currently has form. Will await for him to address it.

## 2016-08-30 ENCOUNTER — Encounter (HOSPITAL_COMMUNITY): Payer: Medicaid Other

## 2016-08-31 NOTE — Progress Notes (Unsigned)
PFT not completed. Patient was sick.

## 2016-08-31 NOTE — Telephone Encounter (Signed)
Pt returned call I let her know that forms were ready for her to pick up, said she would pick them up on Monday.Hillery Hunter

## 2016-08-31 NOTE — Telephone Encounter (Signed)
Patient was contacted to inform her form was signed and is ready for pick up; patient did not answer and voicemail was left informing her to contact office. Form will be placed in designated pick up area.

## 2016-09-03 NOTE — Telephone Encounter (Signed)
Message was delivered to patient on 08/31/2016. Nothing further is needed.

## 2016-09-04 ENCOUNTER — Encounter (HOSPITAL_COMMUNITY): Payer: Medicaid Other

## 2016-09-06 ENCOUNTER — Encounter (HOSPITAL_COMMUNITY): Payer: Medicaid Other

## 2016-09-07 ENCOUNTER — Encounter (HOSPITAL_COMMUNITY): Payer: Self-pay

## 2016-09-07 ENCOUNTER — Ambulatory Visit (HOSPITAL_COMMUNITY)
Admission: RE | Admit: 2016-09-07 | Discharge: 2016-09-07 | Disposition: A | Discharge status: Home / Self Care | Payer: Medicaid Other | Source: Ambulatory Visit | Attending: Cardiology | Admitting: Cardiology

## 2016-09-07 VITALS — BP 105/68 | HR 78 | Wt 139.0 lb

## 2016-09-07 DIAGNOSIS — I2510 Atherosclerotic heart disease of native coronary artery without angina pectoris: Secondary | ICD-10-CM | POA: Diagnosis not present

## 2016-09-07 DIAGNOSIS — J9611 Chronic respiratory failure with hypoxia: Secondary | ICD-10-CM | POA: Insufficient documentation

## 2016-09-07 DIAGNOSIS — Z9981 Dependence on supplemental oxygen: Secondary | ICD-10-CM | POA: Insufficient documentation

## 2016-09-07 DIAGNOSIS — Z7902 Long term (current) use of antithrombotics/antiplatelets: Secondary | ICD-10-CM | POA: Diagnosis not present

## 2016-09-07 DIAGNOSIS — J45909 Unspecified asthma, uncomplicated: Secondary | ICD-10-CM | POA: Diagnosis not present

## 2016-09-07 DIAGNOSIS — I2721 Secondary pulmonary arterial hypertension: Secondary | ICD-10-CM | POA: Insufficient documentation

## 2016-09-07 DIAGNOSIS — I272 Pulmonary hypertension, unspecified: Secondary | ICD-10-CM | POA: Diagnosis not present

## 2016-09-07 DIAGNOSIS — D869 Sarcoidosis, unspecified: Secondary | ICD-10-CM | POA: Diagnosis present

## 2016-09-07 DIAGNOSIS — Z7982 Long term (current) use of aspirin: Secondary | ICD-10-CM | POA: Insufficient documentation

## 2016-09-07 DIAGNOSIS — Z87891 Personal history of nicotine dependence: Secondary | ICD-10-CM | POA: Diagnosis not present

## 2016-09-07 DIAGNOSIS — K219 Gastro-esophageal reflux disease without esophagitis: Secondary | ICD-10-CM | POA: Diagnosis not present

## 2016-09-07 MED ORDER — CLOPIDOGREL BISULFATE 75 MG PO TABS: 75.0000 mg | ORAL_TABLET | Freq: Every day | ORAL | 11 refills | Status: DC

## 2016-09-07 NOTE — Progress Notes (Signed)
6 min walk test completed, pt ambulated 990 ft (301.7 m).  Pt started test on 3L o2 at 99% HR 110 halfway through O2 sats dropped to 81%, pt rested and increased O2 to 4L and sats increased to 94%, test was completed and at end was 92% on 4L and HR 127.

## 2016-09-07 NOTE — Patient Instructions (Signed)
Start [name]) 200 mg Twice daily, this is a specialty medication and has to come from a specialty pharmacy, they will contact you  You have been referred to Cardiac Rehab, they will call you to schedule  Your physician recommends that you schedule a follow-up appointment in: 6 weeks

## 2016-09-08 NOTE — Progress Notes (Signed)
Pulmonology: Dr. Ashok Cordia Cardiology: Dr. [name]  51 yo with sarcoidosis, chronic hypoxemic respiratory failure on home oxygen, and pulmonary hypertension presents for cardiology evaluation.  She was diagnosed with ocular sarcoidosis in 1997. It seems like she did not have significant lung complications (that were recognized at least) until early 2017.  She now requires home oxygen.  CTA chest shows changes consistent with sarcoidosis, this has been confirmed by biopsy.  Echo in 8/17 had evidence for RV strain and elevated PA pressure.  [place] in 10/17 confirmed pulmonary arterial hypertension with normal right and left heart filling pressures.    She has started on macitentan followed by Adcirca.  She feels like these medications have helped her breathing.    She went to [facility] for transplant evaluation in 4/18.  As part of that evaluation, she had right and left heart cath.  Surprisingly, given age and lack of family history, she had an 80% LAD stenosis.  This was treated with DES.  RHC showed mild to moderate pulmonary hypertension.   She thinks that he breathing is overall a bit better.  She has more endurance and can complete a flight of steps without stopping (mild dyspnea at the top).  She can walk about a block before becoming mildly short of breath.  No chest pain.  No lightheadedness/syncope.    Labs (9/17): NA negative, ESR 112, anti-dsDNA negative, anti-CCP negative, RF < 14.  ACE 136.  Labs (10/17): K 4.1, creatinine 0.8 Labs (11/17): K 3.8, creatinine 0.91 Labs (3/18): BNP 16 Labs (5/18): K 4.6, creatinine 0.74  ECG (10/17): NSR, normal  6 minute walk (10/17): 305 m.  6 minute walk (1/18): 305 m 6 minute walk (3/18): 270 m 6 minute walk (6/18): 302 m  PMH: 1. GERD 2. Pulmonary hypertension: Suspect Group 5 PH related to sarcoidosis.  - CTA chest (8/17) with no PE but mediastinal/hilar LAN, cystic lung changes, subpleural consolidation, patchy ground glass.  - Echo (8/17): EF  55-60%, D-shaped interventricular septum, PASP 60 mmHg. - ANA negative, ESR 112, anti-dsDNA negative, anti-CCP negative, RF < 14.  - RHC (10/17): mean RA 3, PA 59/22 mean 36, mean PCWP 5, CI 2.92, PVR 7.4 WU.  - PFTs (10/17): Restrictive.  - RHC (4/18): mean RA 4, PA mean 32, mean PCWP 8, CI 2.9, PVR 5.2 WU.  3. Sarcoidosis: Diagnosed with ocular sarcoidosis in 1997. Significant pulmonary involvement noted in 8/17.  Lung biopsy positive.  - Cardiac MRI (5/18): EF 66%, normal RV size with low normal RV systolic function (EF 28%), no LGE (no evidence for cardiac sarcoidosis).  4. Chronic hypoxemic respiratory failure: Home oxygen 5. Childhood asthma. 6. CAD: LHC (4/18) with 80% LAD stenosis => DES to LAD.   Social History   Social History  . Marital status: Married    Spouse name: N/A  . Number of children: N/A  . Years of education: N/A   Social History Main Topics  . Smoking status: Former Smoker    Packs/day: 0.25    Years: 10.00    Types: Cigarettes    Quit date: 01/11/2011  . Smokeless tobacco: Never Used  . Alcohol use No  . Drug use: No  . Sexual activity: Not Asked   Other Topics Concern  . None   Social History Narrative   Patient denies any bird or mold exposure. Currently works with exposure to inhaled perfumes and chemicals. 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 Previous exposure  to inhaled dust while working in [employer]. Denies any IV drug use or previous incarceration. No known tuberculosis exposure. She has never lived nor volunteered at a homeless shelter.   Family History  Problem Relation Age of Onset  . Hypertension Mother   . Hypertension Father   . Sarcoidosis Maternal Aunt   . Rheumatologic disease Neg Hx    ROS: All systems reviewed and negative except as per HPI.   Current Outpatient Prescriptions  Medication Sig Dispense Refill  . ambrisentan (LETAIRIS) 10 MG tablet Take 1  tablet (10 mg total) by mouth daily.    [name] aspirin 81 MG tablet Take 1 tablet (81 mg total) by mouth daily. 30 tablet 0  . atorvastatin (LIPITOR) 20 MG tablet Take 20 mg by mouth daily.  11  . beclomethasone (QVAR) 80 MCG/ACT inhaler Inhale 2 puffs into the lungs 2 (two) times daily. 1 Inhaler 6  . clopidogrel (PLAVIX) 75 MG tablet Take 1 tablet (75 mg total) by mouth daily. 30 tablet 11  . dextromethorphan-guaiFENesin (MUCINEX DM) 30-600 MG 12hr tablet Take 1 tablet by mouth 2 (two) times daily as needed for cough. 14 tablet 0  . feeding supplement, ENSURE ENLIVE, (ENSURE ENLIVE) LIQD Take 237 mLs by mouth 3 (three) times daily between meals. 90 Bottle 0  . NEXIUM 40 MG capsule TAKE 1 CAPSULE BY MOUTH DAILY AT NOON 30 capsule 3  . predniSONE (DELTASONE) 2.5 MG tablet Take 3 pills daily for four weeks then 2 pills daily until next office visit. 150 tablet 0  . ranitidine (ZANTAC) 150 MG tablet Take 1 tablet (150 mg total) by mouth at bedtime. 30 tablet 3  . sodium chloride (OCEAN) 0.65 % SOLN nasal spray Place 1 spray into both nostrils as needed for congestion. 480 mL 0  . tadalafil, PAH, (ADCIRCA) 20 MG tablet Take 2 tablets (40 mg total) by mouth daily. 180 tablet 3   No current facility-administered medications for this encounter.    BP 105/68   Pulse 78   Wt 139 lb (63 kg)   SpO2 100% Comment: on 3L of O2  BMI 27.15 kg/m  General: Thin, NAD Neck: JVP not elevated, no thyromegaly or thyroid nodule.  Lungs: CTAB CV: Nondisplaced PMI.  Heart regular S1/S2 (loud P2), no S3/S4, no murmur.  No ankle edema.  No carotid bruit.  Normal pedal pulses.  Abdomen: Soft, nontender, no hepatosplenomegaly, no distention.  Skin: Intact without lesions or rashes.  Neurologic: Alert and oriented x 3.  Psych: Normal affect. Extremities: No clubbing or cyanosis.  HEENT: Normal.   Assessment/Plan: 1. Sarcoidosis: Followed by Dr. Ashok Cordia.  Ocular and lung involvement, biopsy-proven.  Cardiac MRI in 5/18  did not show evidence for cardiac sarcoidosis.  She is on prednisone and home oxygen. She has been evaluated at [facility] for lung transplant.  Will need to wait 6 months post-stent in 4/18.   2. Pulmonary hypertension: Pulmonary arterial hypertension. Suspect Group 5 PH related to sarcoidosis. CTA chest showed no PE but did show changes of sarcoidosis.  PFTs were restrictive.  Echo showed RV strain and elevated PA pressure.  PAH was confirmed by [place] in 10/17 with PVR 7.4 WU.  Serologic workup was negative.  RHC in 4/18 showed mild to moderate residual pulmonary hypertension.  - 6 minute walk done today => distance was improved.  - Continue Adcirca and ambrisentan.  She feels like these meds have helped.  Given ongoing pulmonary hypertension on RHC, I will add selexipag  to her regimen.  3. CAD: Found incidentally on cath 4/18 done as part of workup for lung transplantation.  80% LAD stenosis, treated with DES to LAD.   No chest pain.  - Continue ASA and Plavix.  - Continue atorvastatin, will need to check lipids next appt.   - Arrange for cardiac rehab.   Followup in 6 wks.     Loralie Champagne 09/08/2016

## 2016-09-10 ENCOUNTER — Other Ambulatory Visit: Payer: Self-pay | Admitting: Pulmonary Disease

## 2016-09-11 ENCOUNTER — Other Ambulatory Visit (HOSPITAL_COMMUNITY): Payer: Self-pay | Admitting: Pharmacist

## 2016-09-11 ENCOUNTER — Encounter (HOSPITAL_COMMUNITY): Payer: Medicaid Other

## 2016-09-11 MED ORDER — SELEXIPAG 200 MCG PO TABS: 200.0000 ug | ORAL_TABLET | Freq: Two times a day (BID) | ORAL | 11 refills | Status: DC

## 2016-09-11 NOTE — Progress Notes (Signed)
Erin Zavala

## 2016-09-13 ENCOUNTER — Encounter (HOSPITAL_COMMUNITY): Payer: Medicaid Other

## 2016-09-18 ENCOUNTER — Telehealth (HOSPITAL_COMMUNITY): Payer: Self-pay

## 2016-09-18 ENCOUNTER — Encounter (HOSPITAL_COMMUNITY): Payer: Medicaid Other

## 2016-09-18 NOTE — Telephone Encounter (Signed)
Patient insurance is active and benefits verified. Patient has Medicaid - no co-payment, no deductible, no co-insurance, no out of pocket and no pre-authorization. Passport/reference # [id number].

## 2016-09-20 ENCOUNTER — Encounter (HOSPITAL_COMMUNITY): Payer: Medicaid Other

## 2016-09-21 ENCOUNTER — Telehealth (HOSPITAL_COMMUNITY): Payer: Self-pay | Admitting: Pharmacist

## 2016-09-21 NOTE — Telephone Encounter (Signed)
Uptravi dose pack approved by [place] Medicaid through 09/20/17.   Ruta Hinds. Velva Harman, PharmD, BCPS, CPP Clinical Pharmacist Pager: [phone] Phone: [phone] 09/21/2016 10:51 AM

## 2016-09-25 ENCOUNTER — Encounter (HOSPITAL_COMMUNITY): Payer: Medicaid Other

## 2016-09-26 ENCOUNTER — Other Ambulatory Visit (INDEPENDENT_AMBULATORY_CARE_PROVIDER_SITE_OTHER): Payer: Medicaid Other

## 2016-09-26 ENCOUNTER — Ambulatory Visit (INDEPENDENT_AMBULATORY_CARE_PROVIDER_SITE_OTHER): Payer: Medicaid Other | Admitting: *Deleted

## 2016-09-26 ENCOUNTER — Encounter: Payer: Self-pay | Admitting: Pulmonary Disease

## 2016-09-26 ENCOUNTER — Ambulatory Visit (INDEPENDENT_AMBULATORY_CARE_PROVIDER_SITE_OTHER): Payer: Medicaid Other | Admitting: Pulmonary Disease

## 2016-09-26 VITALS — BP 112/60 | HR 76 | Ht 60.0 in | Wt 140.2 lb

## 2016-09-26 DIAGNOSIS — D869 Sarcoidosis, unspecified: Secondary | ICD-10-CM

## 2016-09-26 DIAGNOSIS — K219 Gastro-esophageal reflux disease without esophagitis: Secondary | ICD-10-CM

## 2016-09-26 DIAGNOSIS — J9611 Chronic respiratory failure with hypoxia: Secondary | ICD-10-CM

## 2016-09-26 DIAGNOSIS — I2729 Other secondary pulmonary hypertension: Secondary | ICD-10-CM

## 2016-09-26 LAB — COMPREHENSIVE METABOLIC PANEL
ALT: 12 U/L (ref 0–35)
AST: 17 U/L (ref 0–37)
Albumin: 4.2 g/dL (ref 3.5–5.2)
Alkaline Phosphatase: 67 U/L (ref 39–117)
BUN: 10 mg/dL (ref 6–23)
CALCIUM: 10 mg/dL (ref 8.4–10.5)
CHLORIDE: 101 meq/L (ref 96–112)
CO2: 27 meq/L (ref 19–32)
Creatinine, Ser: 0.79 mg/dL (ref 0.40–1.20)
GFR: 98.86 mL/min (ref >60.00)
Glucose, Bld: 98 mg/dL (ref 70–99)
POTASSIUM: 4.3 meq/L (ref 3.5–5.1)
Sodium: 136 mEq/L (ref 135–145)
Total Bilirubin: 0.3 mg/dL (ref 0.2–1.2)
Total Protein: 7.9 g/dL (ref 6.0–8.3)

## 2016-09-26 LAB — CBC WITH DIFFERENTIAL/PLATELET
BASOS PCT: 0.7 % (ref 0.0–3.0)
Basophils Absolute: 0.1 10*3/uL (ref 0.0–0.1)
Eosinophils Absolute: 0.3 10*3/uL (ref 0.0–0.7)
Eosinophils Relative: 2.8 % (ref 0.0–5.0)
HEMATOCRIT: 33.5 % — AB (ref 36.0–46.0)
HEMOGLOBIN: 10.5 g/dL — AB (ref 12.0–15.0)
LYMPHS PCT: 9.8 % — AB (ref 12.0–46.0)
Lymphs Abs: 1.1 10*3/uL (ref 0.7–4.0)
MCHC: 31.3 g/dL (ref 30.0–36.0)
MCV: 74.2 fl — AB (ref 78.0–100.0)
Monocytes Absolute: 0.7 10*3/uL (ref 0.1–1.0)
Monocytes Relative: 6.8 % (ref 3.0–12.0)
NEUTROS ABS: 8.7 10*3/uL — AB (ref 1.4–7.7)
Neutrophils Relative %: 79.9 % — ABNORMAL HIGH (ref 43.0–77.0)
PLATELETS: 403 10*3/uL — AB (ref 150.0–400.0)
RBC: 4.51 Mil/uL (ref 3.87–5.11)
RDW: 17.8 % — AB (ref 11.5–15.5)
WBC: 10.9 10*3/uL — ABNORMAL HIGH (ref 4.0–10.5)

## 2016-09-26 MED ORDER — PREDNISONE 2.5 MG PO TABS: 2.5000 mg | ORAL_TABLET | Freq: Every day | ORAL | 0 refills | Status: DC

## 2016-09-26 NOTE — Patient Instructions (Signed)
   We are going to taper your Prednisone further as follows:  Continue on 2 pills (36m) daily for the next 2 weeks, then  Take 1 pill daily for 4 weeks and stop taking after 4 weeks  Call my office or e-mail me if you have any new breahting problem or feel your cough is getting worse as we taper your Prednisone further.  We are going to keep your Qvar inhaler at its current dose.  TESTS ORDERED: 1. Serum CMP, ACE, & CBC today 2. Spirometry with DLCO at next appointment 3. 6MWT with oxygen titration at next appointment

## 2016-09-26 NOTE — Addendum Note (Signed)
Addended by: Tyson Dense on: 09/26/2016 05:24 PM   Modules accepted: Orders

## 2016-09-26 NOTE — Progress Notes (Signed)
Subjective:    Patient ID: Erin Zavala, female    DOB: [birth year], 51 y.o.   MRN: [id number]  C.C.:  Follow-up for Sarcoidosis, Chronic Hypoxic Respiratory Failure, Moderate Pulmonary Hypertension, & GERD.  HPI  Sarcoidosis: Involving lungs and eyes previously. Biopsy confirmed 01/16/16. Evaluated by [facility] lung transplant team. Started on prednisone taper at last appointment from 10 mg. Patient also on Qvar 160 g inhaled twice daily. Patient previously referred to pulmonary rehabilitation but transitioned to cardiac rehabilitation post stent placement. She reports her cough seemed to recur initially as her taper initiated. She reports now her cough is only "occasional". She denies any increase in her baseline dyspnea on exertion. She denies any wheezing. No rashes or bruising. She has been exercising at the [facility] because Cardiac Rehab hasn't contacted her yet.   Chronic hypoxic respiratory failure: Previously patient was requiring oxygen at 3 L/m at rest and 4 L/m with exertion. Walk test today shows an oxygen requirement of 3 L/m with exertion and none at rest.  Pulmonary hypertension: Confirmed with right heart catheterization October 2017. Following with cardiology. Currently on [employer]. She is planned to transition to [place] by Dr. Marigene Ehlers. Denies any lower extremity edema. No syncope or near syncope. No palpitations.   GERD: Previously using Zantac regularly but Nexium only intermittently. No reflux, dyspepsia or morning brash water taste. She is now using Nexium Daily.   Review of Systems No headaches. She reports only rare blurry vision. No eye swelling, erythema or pain. No focal weakness, numbness or tingling. No abdominal pain, nausea, or emesis.   Allergies  Allergen Reactions  . No Known Allergies     Current Outpatient Prescriptions on File Prior to Visit  Medication Sig Dispense Refill  . ambrisentan (LETAIRIS) 10 MG tablet Take 1 tablet (10 mg total) by mouth  daily.    [name] aspirin 81 MG tablet Take 1 tablet (81 mg total) by mouth daily. 30 tablet 0  . atorvastatin (LIPITOR) 20 MG tablet Take 20 mg by mouth daily.  11  . beclomethasone (QVAR) 80 MCG/ACT inhaler Inhale 2 puffs into the lungs 2 (two) times daily. 1 Inhaler 6  . clopidogrel (PLAVIX) 75 MG tablet Take 1 tablet (75 mg total) by mouth daily. 30 tablet 11  . NEXIUM 40 MG capsule TAKE 1 CAPSULE BY MOUTH DAILY AT NOON 30 capsule 3  . predniSONE (DELTASONE) 2.5 MG tablet Take 3 pills daily for four weeks then 2 pills daily until next office visit. 150 tablet 0  . ranitidine (ZANTAC) 150 MG tablet TAKE 1 TABLET (150 MG TOTAL) BY MOUTH AT BEDTIME. 30 tablet 3  . Selexipag (UPTRAVI) 200 MCG TABS Take 1 tablet (200 mcg total) by mouth 2 (two) times daily. 60 tablet 11  . tadalafil, PAH, (ADCIRCA) 20 MG tablet Take 2 tablets (40 mg total) by mouth daily. 180 tablet 3   No current facility-administered medications on file prior to visit.     Past Medical History:  Diagnosis Date  . Asthma    as a child  . Cough   . Dyspnea   . GERD (gastroesophageal reflux disease)   . Migraines   . Oxygen dependent   . Pulmonary hypertension ([place])   . Sarcoidosis     Past Surgical History:  Procedure Laterality Date  . CARDIAC CATHETERIZATION N/A 12/12/2015   Procedure: Right Heart Cath;  Surgeon: Larey Dresser, MD;  Location: [place] [facility];  Service: Cardiovascular;  Laterality: N/A;  .  MULTIPLE TOOTH EXTRACTIONS  01/13/16   all teeth will be removed  . tuballigation    . VIDEO BRONCHOSCOPY WITH ENDOBRONCHIAL ULTRASOUND N/A 01/16/2016   Procedure: VIDEO BRONCHOSCOPY WITH ENDOBRONCHIAL ULTRASOUND;  Surgeon: Melrose Nakayama, MD;  Location[facility] OR;  Service: Thoracic;  Laterality: N/A;    Family History  Problem Relation Age of Onset  . Hypertension Mother   . Hypertension Father   . Sarcoidosis Maternal Aunt   . Rheumatologic disease Neg Hx     Social History   Social History  .  Marital status: Married    Spouse name: N/A  . Number of children: N/A  . Years of education: N/A   Social History Main Topics  . Smoking status: Former Smoker    Packs/day: 0.25    Years: 10.00    Types: Cigarettes    Quit date: 01/11/2011  . Smokeless tobacco: Never Used  . Alcohol use No  . Drug use: No  . Sexual activity: Not Asked   Other Topics Concern  . None   Social History Narrative   Patient denies any bird or mold exposure. Currently works with exposure to inhaled perfumes and chemicals. 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 Previous exposure to inhaled dust while working in [employer]. Denies any IV drug use or previous incarceration. No known tuberculosis exposure. She has never lived nor volunteered at a homeless shelter.      Objective:   Physical Exam BP 112/60 (BP Location: Right Arm, Patient Position: Sitting, Cuff Size: Normal)   Pulse 76   Ht 5' (1.524 m)   Wt 140 lb 3.2 oz (63.6 kg)   SpO2 100%   BMI 27.38 kg/m   General:  Awake. Alert. No acute distress. African-American female. Integument:  Warm & dry. No rash on exposed skin. No bruising on exposed skin. Extremities:  No cyanosis.  HEENT:  Moist mucus membranes. Minimal nasal turbinate swelling. No oral ulcers. Cardiovascular:  Regular rate. No edema. Normal S1 & S2.  Pulmonary:  Good aeration & clear to auscultation bilaterally. Symmetric chest wall expansion. No accessory muscle [place] on room air e. Abdomen: Soft. Normal bowel sounds. Nondistended.  Musculoskeletal:  Normal bulk and tone. No joint deformity or effusion appreciated.  PFT 08/23/16: FVC 1.70 L (69%) FEV1 1.49 L (75%) FEV1/FVC 0.87 FEF 25-75 1.99 L (92%)                                                                                                             DLCO corrected 31% 12/27/15: FVC 1.53 L (61%) FEV1 1.21 L (60%) FEV1/FVC 0.79 FEF 25-75 1.03 L (46%) negative  bronchodilator response TLC 2.62 L (58%) RV 70% ERV 109% DLCO unreliable  6MWT 09/26/16:  Walked 333 meters / Baseline Sat 93% on RA / Nadir Sat 86% on 2 L/m @ 48 meters (required 3 L/m with exertion to maintain) 05/03/16:  Walked 320 meters / Baseline Sat 99% on  3 L/m / Nadir Sat 68% on  3 L/m @ end of test (required 4 L/m with exertion to maintain with exertion but desaturated with coughing) 12/28/15:  Walked 264 meters / Baseline Sat 100% on 2 L/m / Nadir Sat 88% on 2 L/m (required 3 L/m to maintain saturation with exertion)  IMAGING CTA CHEST 11/01/15 (previously reviewed by me):No pulmonary emboli noted. No pleural effusion or thickening. Enlarged main pulmonary artery. No pericardial effusion appreciated. Bulky mediastinal and hilar lymphadenopathy. Bilateral upper lung cystic changes as well as subpleural consolidation within right lower lung and bilateral upper lobes. Bilateral patchy groundglass also noted.  CARDIAC RHC (12/12/15): RA Mean - 3 RV - 55/1 PA - 59/22 (36) PCWP - 5 PA Sat - 75% Aortic Sat - 100% CO - 4.2 CI - 2.92 PVR - 7.4 WU  48 HOUR HOLTER MONITOR (12/05/15):  Sinus rhythm with PVCs & PACs. HR 70-160.  TTE (11/02/15):LV normal in size with EF 55-60%.No wall motion abnormalities. Grade 1 diastolic dysfunction. LA &RA normal in size. RV normal in size and function. Pulmonary artery systolic pressure 60 mmHg. Diastolic flattening and systolic flattening of the intraventricular septum. No aortic stenosis or regurgitation. Aortic root normal in size. Trivial mitral regurgitation without stenosis. No pulmonic stenosis. Mild tricuspid regurgitation. Trivial pericardial effusion.  PATHOLOGY BAL RUL (01/16/16):  No malignancy w/ granulomatous inflammation EBUS FNA Level 7 (01/16/16):  No malignancy & vague granulomatous inflammation Brushing RUL (01/16/16):  Vague granulomatous inflammation Transbronchial Biopsies RUL & Trachea (01/16/16):  Benign respiratory  epthelium  MICROBIOLOGY BAL (01/16/16):  Bacteria negative / AFB negative / Fungus negative Quantiferon-TB 02/28/16:  Negative Skin PPD 03/02/16:  Negative (0 induration) Influenza DFA (05/03/16):  Negative   LABS 07/23/16 CBC: 11.4/10.4/32.7/540 BMP: 138/4.6/104/27/10/0.74/103/9.2 LFT: 3.9/7.5/0.4/63/19/14 ACE:  72  12/09/15 LDH:  370 CRP:  3.4 ESR:  112 ANA:  Negative ACE:  136 Anti-CCP:  <16 RF:  <14 Smith Ab:  <1.0 DS DNA Ab:  1 SSA:  <1.0 SSB:  <1.0 3/17 ACE:  120 ANA:  1:80 (speckled)    Assessment & Plan:  51 y.o. female with pulmonary hypertension, sarcoidosis, and chronic hypoxic respiratory failure. Patient has not yet been contacted by cardiac rehabilitation to establish care which is somewhat disappointing. Overall her pulmonary hypertension seems to be better controlled but her regimen is being adjusted given results of her previous pressures during her evaluation by [facility]. Patient's spirometry has significant improved since initial testing and her walk test is steadily improving as well. It's unclear to me whether or not she has any arterial involvement of her sarcoidosis directly and this warrants close monitoring as I taper her prednisone further given the potential for worsening of her pulmonary hypertension. I instructed the patient to contact my office if she had any new breathing problems or questions before her next appointment.  1. Sarcoidosis: Continuing taper of prednisone by 2.5 mg every 4 weeks. Continuing Qvar 160 g inhaled twice daily. Checking serum CMP, CBC, and ACE today. Repeat spirometry with DLCO at next appointment. 2. Chronic hypoxic respiratory failure: Continuing on oxygen at 3 L/m with exertion. Patient instructed to maintain oxygen saturation greater than 92%. Repeat 6 minute walk test with oxygen titration at next appointment. 3. Pulmonary hypertension: Followed by cardiology. Continuing on treatment as planned. 4. GERD: Controlled with Nexium  and Zantac. No new medications. 5. Health maintenance: Planning for Prevnar vaccine August 2018. Status post influenza vaccine December 2017 & Pneumovax August 2017.  6. Follow-up: Return to clinic in 8 weeks or sooner if  needed.  Sonia Baller Ashok Cordia, M.D. [facility] Pulmonary & Critical Care Pager:  [phone] After 3pm or if no response, call [phone] 4:33 PM 09/26/16

## 2016-09-26 NOTE — Progress Notes (Signed)
SIX MIN WALK 09/26/2016 09/26/2016 07/23/2016 05/18/2016 05/03/2016 12/28/2015 11/28/2015  Medications ASA 90m,lipitor 237mqvar 80,plavix 7543mexium 40,prednisone 2.5mg69ml taken between 8:30am-9am - - - asa 81, qvar 80mc49mexium 40mg,3mure, Prednisone 10mg -57m at 9am / Adcirca 20mg an2msumit - both at 5am aspirin 81mg @ 939mnd prednisone 10mg @ 9a68m Supplimental Oxygen during Test? (L/min) Yes No No Yes Yes Yes No  O2 Flow Rate 2 - - _0 -  Type Continuous - - Continuous Continuous Continuous -  Laps 6 - - - 6 5 -  Partial Lap (in Meters) 45 - - - 32 24 -  Baseline BP (sitting) 118/62 - - - 112/62 112/62 -  Baseline Heartrate 88 125 - 104 97 98 -  Baseline Dyspnea (Borg Scale) 0 - - - 3 2 -  Baseline Fatigue (Borg Scale) 0 - - - 0 0 -  Baseline SPO2 100 88 - 92 99 100 -  BP (sitting) 122/64 - - - 140/70 122/70 -  Heartrate 128 - - 111 150 120 -  Dyspnea (Borg Scale) 2 - - - 4 4 -  Fatigue (Borg Scale) 0 - - - 0 2 -  SPO2 89 - - 90 68 93 -  BP (sitting) 120/60 - - - 110/60 122/72 -  Heartrate 87 - - 98 108 95 -  SPO2 100 - - 96 78 98 -  Stopped or Paused before Six Minutes Yes - - Yes No Yes -  Other Symptoms at end of Exercise Pt walked 2 full laps (48 meters) before she was stopped due to sats dropping to 86% on 2L she had 3 mins and 38 secs left. She was then bumped up to 3L sats 95% and walk proceeded - - Patient started walk on 2 L O2, desatted to 70's after 2-3 minutes, O2 up to 4L, sats came to 90's. Minimally SOB, toelrated well, steady gait and pace. - paused to adjust 02 tank from 2L to 3L  -  Distance Completed 333 - - - 320 264 -  Tech Comments: Pt walked a steady pace with her oxygen tank, she did not express any dyspnea during or after the walk nor did she express any discomfort  Pt sats were on RA at rest while in lobby were 93% 113 HR, Pt remained off oxygen and walked to back hallway for walk and sats dropped to 88% HR 125, from there was started on oxygen  Patient  walked from scale to room 9A with steady gait and moderate pace.  256 meters (840 ft) Pt walked at normal pace (per pt); patient did desat to 68% at the end of the 6MW and sat for 10 minutes to recover on the 3 liters before O2 being titrated. O2 titration was unable to be completed due topatient being too SOB. Pt reported being sick, onset about 3 days ago with cough, SOB, chills and body aches at times with little sore throat. Pt was adamant on doing the 6MW so we attempted it. Pt's O2 sats remained in upper 80s saturations 2 minutes post walk and O2 was increased to 4 liters which brought O2 to 95%. Pt seemed to be having a hard time after the test moving oxygen and stated that she was tight and felt congested. Spoke with Dr Nestor andAshok Cordiareed that the test wewould end the test and the patient was placed in an exam room. PFT not performed today d/t being sick.   -  amg pt desated on 2L to 88 but maintained on 3L above 90%. pt completed test with no complaints.  Pt placed on 2 liters O2 sats stayed below 85%. Pt. then placed on 3L O2 maintained sats at 93% while walking around office

## 2016-09-27 ENCOUNTER — Encounter (HOSPITAL_COMMUNITY): Payer: Medicaid Other

## 2016-09-27 LAB — ANGIOTENSIN CONVERTING ENZYME: Angiotensin-Converting Enzyme: 80 U/L — ABNORMAL HIGH (ref 9–67)

## 2016-10-02 ENCOUNTER — Encounter (HOSPITAL_COMMUNITY): Payer: Medicaid Other

## 2016-10-04 ENCOUNTER — Encounter (HOSPITAL_COMMUNITY): Payer: Medicaid Other

## 2016-10-09 ENCOUNTER — Encounter (HOSPITAL_COMMUNITY): Payer: Medicaid Other

## 2016-10-10 ENCOUNTER — Other Ambulatory Visit (HOSPITAL_COMMUNITY): Payer: Self-pay | Admitting: Pharmacist

## 2016-10-10 MED ORDER — AMBRISENTAN 10 MG PO TABS: 10.0000 mg | ORAL_TABLET | Freq: Every day | ORAL | 11 refills | Status: DC

## 2016-10-11 ENCOUNTER — Encounter (HOSPITAL_COMMUNITY): Payer: Medicaid Other

## 2016-10-13 ENCOUNTER — Other Ambulatory Visit: Payer: Self-pay | Admitting: Pulmonary Disease

## 2016-10-15 ENCOUNTER — Ambulatory Visit (HOSPITAL_COMMUNITY)
Admission: RE | Admit: 2016-10-15 | Discharge: 2016-10-15 | Disposition: A | Discharge status: Home / Self Care | Payer: Medicaid Other | Source: Ambulatory Visit | Attending: Cardiology | Admitting: Cardiology

## 2016-10-15 ENCOUNTER — Encounter (HOSPITAL_COMMUNITY): Payer: Self-pay | Admitting: Cardiology

## 2016-10-15 VITALS — BP 122/88 | HR 93 | Wt 138.5 lb

## 2016-10-15 DIAGNOSIS — I2510 Atherosclerotic heart disease of native coronary artery without angina pectoris: Secondary | ICD-10-CM | POA: Diagnosis not present

## 2016-10-15 DIAGNOSIS — I272 Pulmonary hypertension, unspecified: Secondary | ICD-10-CM

## 2016-10-15 DIAGNOSIS — Z7982 Long term (current) use of aspirin: Secondary | ICD-10-CM | POA: Insufficient documentation

## 2016-10-15 DIAGNOSIS — Z7902 Long term (current) use of antithrombotics/antiplatelets: Secondary | ICD-10-CM | POA: Insufficient documentation

## 2016-10-15 DIAGNOSIS — D860 Sarcoidosis of lung: Secondary | ICD-10-CM | POA: Diagnosis not present

## 2016-10-15 DIAGNOSIS — J9611 Chronic respiratory failure with hypoxia: Secondary | ICD-10-CM | POA: Insufficient documentation

## 2016-10-15 DIAGNOSIS — I2721 Secondary pulmonary arterial hypertension: Secondary | ICD-10-CM | POA: Insufficient documentation

## 2016-10-15 DIAGNOSIS — Z79899 Other long term (current) drug therapy: Secondary | ICD-10-CM | POA: Insufficient documentation

## 2016-10-15 DIAGNOSIS — K219 Gastro-esophageal reflux disease without esophagitis: Secondary | ICD-10-CM | POA: Insufficient documentation

## 2016-10-15 DIAGNOSIS — Z9981 Dependence on supplemental oxygen: Secondary | ICD-10-CM | POA: Insufficient documentation

## 2016-10-15 DIAGNOSIS — D869 Sarcoidosis, unspecified: Secondary | ICD-10-CM | POA: Insufficient documentation

## 2016-10-15 DIAGNOSIS — Z87891 Personal history of nicotine dependence: Secondary | ICD-10-CM | POA: Insufficient documentation

## 2016-10-15 LAB — LIPID PANEL
CHOL/HDL RATIO: 3.4 ratio
CHOLESTEROL: 170 mg/dL (ref 0–200)
HDL: 50 mg/dL (ref >40)
LDL Cholesterol: 100 mg/dL — ABNORMAL HIGH (ref 0–99)
Triglycerides: 101 mg/dL (ref <150)
VLDL: 20 mg/dL (ref 0–40)

## 2016-10-15 NOTE — Patient Instructions (Signed)
Lab today  Your physician recommends that you schedule a follow-up appointment in: 3 months

## 2016-10-15 NOTE — Progress Notes (Signed)
Pulmonology: Dr. Ashok Cordia Cardiology: Dr. [name]  51 yo with sarcoidosis, chronic hypoxemic respiratory failure on home oxygen, and pulmonary hypertension presents for cardiology followup.  She was diagnosed with ocular sarcoidosis in 1997. It seems like she did not have significant lung complications (that were recognized at least) until early 2017.  She now requires home oxygen.  CTA chest shows changes consistent with sarcoidosis, this has been confirmed by biopsy.  Echo in 8/17 had evidence for RV strain and elevated PA pressure.  [place] in 10/17 confirmed pulmonary arterial hypertension with normal right and left heart filling pressures.    She has started on macitentan followed by Adcirca.  She feels like these medications have helped her breathing.    She went to [facility] for transplant evaluation in 4/18.  As part of that evaluation, she had right and left heart cath.  Surprisingly, given age and lack of family history, she had an 80% LAD stenosis.  This was treated with DES.  RHC showed mild to moderate pulmonary hypertension.   She felt better after starting ambrisentan and Adcirca.  I next started her on selexipag.  She developed diarrhea/vomiting at 200 mcg bid and got severe leg cramps in addition when it was increased to 400 mcg bid.  She finally stopped selexipag and symptoms all resolved. Dyspnea is stable, does ok on flat ground when wearing oxygen.  No chest pain.    Labs (9/17): NA negative, ESR 112, anti-dsDNA negative, anti-CCP negative, RF < 14.  ACE 136.  Labs (10/17): K 4.1, creatinine 0.8 Labs (11/17): K 3.8, creatinine 0.91 Labs (3/18): BNP 16 Labs (5/18): K 4.6, creatinine 0.74 Labs (7/18): creatinine 0.79  6 minute walk (10/17): 305 m.  6 minute walk (1/18): 305 m 6 minute walk (3/18): 270 m 6 minute walk (6/18): 302 m  PMH: 1. GERD 2. Pulmonary hypertension: Suspect Group 5 PH related to sarcoidosis.  - CTA chest (8/17) with no PE but mediastinal/hilar LAN, cystic lung  changes, subpleural consolidation, patchy ground glass.  - Echo (8/17): EF 55-60%, D-shaped interventricular septum, PASP 60 mmHg. - ANA negative, ESR 112, anti-dsDNA negative, anti-CCP negative, RF < 14.  - RHC (10/17): mean RA 3, PA 59/22 mean 36, mean PCWP 5, CI 2.92, PVR 7.4 WU.  - PFTs (10/17): Restrictive.  - RHC (4/18): mean RA 4, PA mean 32, mean PCWP 8, CI 2.9, PVR 5.2 WU.  - Did not tolerate selexipag.  3. Sarcoidosis: Diagnosed with ocular sarcoidosis in 1997. Significant pulmonary involvement noted in 8/17.  Lung biopsy positive.  - Cardiac MRI (5/18): EF 66%, normal RV size with low normal RV systolic function (EF 16%), no LGE (no evidence for cardiac sarcoidosis).  4. Chronic hypoxemic respiratory failure: Home oxygen 5. Childhood asthma. 6. CAD: LHC (4/18) with 80% LAD stenosis => DES to LAD.   Social History   Social History  . Marital status: Married    Spouse name: N/A  . Number of children: N/A  . Years of education: N/A   Social History Main Topics  . Smoking status: Former Smoker    Packs/day: 0.25    Years: 10.00    Types: Cigarettes    Quit date: 01/11/2011  . Smokeless tobacco: Never Used  . Alcohol use No  . Drug use: No  . Sexual activity: Not Asked   Other Topics Concern  . None   Social History Narrative   Patient denies any bird or mold exposure. Currently works with exposure to inhaled perfumes  and chemicals. 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 Previous exposure to inhaled dust while working in [employer]. Denies any IV drug use or previous incarceration. No known tuberculosis exposure. She has never lived nor volunteered at a homeless shelter.   Family History  Problem Relation Age of Onset  . Hypertension Mother   . Hypertension Father   . Sarcoidosis Maternal Aunt   . Rheumatologic disease Neg Hx    ROS: All systems reviewed and negative except as per HPI.   Current  Outpatient Prescriptions  Medication Sig Dispense Refill  . ambrisentan (LETAIRIS) 10 MG tablet Take 1 tablet (10 mg total) by mouth daily. 30 tablet 11  . aspirin 81 MG tablet Take 1 tablet (81 mg total) by mouth daily. 30 tablet 0  . atorvastatin (LIPITOR) 20 MG tablet Take 20 mg by mouth daily.  11  . beclomethasone (QVAR) 80 MCG/ACT inhaler Inhale 2 puffs into the lungs 2 (two) times daily. 1 Inhaler 6  . clopidogrel (PLAVIX) 75 MG tablet Take 1 tablet (75 mg total) by mouth daily. 30 tablet 11  . NEXIUM 40 MG capsule TAKE 1 CAPSULE BY MOUTH DAILY AT NOON 30 capsule 3  . predniSONE (DELTASONE) 2.5 MG tablet Take 1 tablet (2.5 mg total) by mouth daily with breakfast. 120 tablet 0  . ranitidine (ZANTAC) 150 MG tablet TAKE 1 TABLET (150 MG TOTAL) BY MOUTH AT BEDTIME. 30 tablet 3  . tadalafil, PAH, (ADCIRCA) 20 MG tablet Take 2 tablets (40 mg total) by mouth daily. 180 tablet 3   No current facility-administered medications for this encounter.    BP 122/88   Pulse 93   Wt 138 lb 8 oz (62.8 kg)   SpO2 100% Comment: on 2L of O2  BMI 27.05 kg/m  General: NAD Neck: JVP 8 cm, no thyromegaly or thyroid nodule.  Lungs: Clear to auscultation bilaterally with normal respiratory effort. CV: Nondisplaced PMI.  Heart regular S1/S2 (loud P2), no S3/S4, no murmur.  Trace ankle edema.  No carotid bruit.  Normal pedal pulses.  Abdomen: Soft, nontender, no hepatosplenomegaly, no distention.  Skin: Intact without lesions or rashes.  Neurologic: Alert and oriented x 3.  Psych: Normal affect. Extremities: No clubbing or cyanosis.  HEENT: Normal.    Assessment/Plan: 1. Sarcoidosis: Followed by Dr. Ashok Cordia.  Ocular and lung involvement, biopsy-proven.  Cardiac MRI in 5/18 did not show evidence for cardiac sarcoidosis.  She is on prednisone and home oxygen. She has been evaluated at [facility] for lung transplant.   2. Pulmonary hypertension: Pulmonary arterial hypertension. Suspect Group 5 PH related to  sarcoidosis. CTA chest showed no PE but did show changes of sarcoidosis.  PFTs were restrictive.  Echo showed RV strain and elevated PA pressure.  PAH was confirmed by [place] in 10/17 with PVR 7.4 WU.  Serologic workup was negative.  [place] in 4/18 showed mild to moderate residual pulmonary hypertension.  - Will do 6 minute walk today.   - Continue Adcirca and ambrisentan.  She feels like these meds have helped.  She was unable to tolerate selexipag due to intractable side effects.  Would consider Tyvaso in the future.   3. CAD: Found incidentally on cath 4/18 done as part of workup for lung transplantation.  80% LAD stenosis, treated with DES to LAD.   No chest pain.  - Continue ASA and Plavix.  - Continue atorvastatin, check lipids.   Followup in 3 months  Loralie Champagne 10/15/2016

## 2016-10-15 NOTE — Progress Notes (Signed)
6MW completed, pt ambulated 460 ft (140 m) ending HR 142 O2 sat 82% on 2L

## 2016-10-16 ENCOUNTER — Encounter (HOSPITAL_COMMUNITY): Payer: Medicaid Other

## 2016-10-17 ENCOUNTER — Telehealth (HOSPITAL_COMMUNITY): Payer: Self-pay | Admitting: *Deleted

## 2016-10-17 DIAGNOSIS — E7800 Pure hypercholesterolemia, unspecified: Secondary | ICD-10-CM

## 2016-10-17 MED ORDER — ATORVASTATIN CALCIUM 40 MG PO TABS: 40.0000 mg | ORAL_TABLET | Freq: Every day | ORAL | 3 refills | Status: DC

## 2016-10-17 NOTE — Telephone Encounter (Signed)
-----  Message from Larey Dresser, MD sent at 10/16/2016  5:06 PM EDT ----- Regarding: RE: Ok to titrate oxygen therapy during exercise at cardiac rehab Yes, keep sat 90 and above.  ----- Message ----- From: Rowe Pavy, RN Sent: 10/16/2016   2:42 PM To: Larey Dresser, MD Subject: Ok to titrate oxygen therapy during exercise#  Dr. [name],  The above patient is scheduled to attend cardiac rehab orientation on next Tuesday 8/14 s/p stent placement at [facility].  Noted in pt history, pt uses 3L O2 at rest and 4L O2 on exertion.  May we titrate pt oxygen therapy to maintain her O2 saturation?  If so, what is an appropriate range for this patient?  Thank you for the advisement Maurice Small RN, BSN Cardiac and Pulmonary Rehab Nurse Navigator

## 2016-10-17 NOTE — Telephone Encounter (Signed)
-----  Message from Larey Dresser, MD sent at 10/16/2016  5:30 PM EDT ----- LDL too high, increase atorvastatin to 40 mg daily with lipids/LFTs in 2 months. Goal < 70

## 2016-10-18 ENCOUNTER — Encounter (HOSPITAL_COMMUNITY): Payer: Medicaid Other

## 2016-10-18 ENCOUNTER — Telehealth (HOSPITAL_COMMUNITY): Payer: Self-pay | Admitting: Pharmacist

## 2016-10-18 NOTE — Telephone Encounter (Signed)
Cardiac Rehab Medication Review by a Pharmacist  Does the patient  feel that his/her medications are working for him/her?  yes  Has the patient been experiencing any side effects to the medications prescribed?  no  Does the patient measure his/her own blood pressure or blood glucose at home?  no   Does the patient have any problems obtaining medications due to transportation or finances?   no  Understanding of regimen: fair Understanding of indications: fair Potential of compliance: fair    Pharmacist comments: 89 yoF presenting for cardiac rehab orientation. Pt did not participate much in the taking of her medication history, but was able to confirm which medications she was and was not taking.   Erin Zavala Gerarda Fraction, PharmD PGY1 Pharmacy Resident Pager: [phone]  10/18/2016 2:13 PM

## 2016-10-23 ENCOUNTER — Encounter (HOSPITAL_COMMUNITY): Payer: Self-pay

## 2016-10-23 ENCOUNTER — Encounter (HOSPITAL_COMMUNITY)
Admission: RE | Admit: 2016-10-23 | Discharge: 2016-10-23 | Disposition: A | Discharge status: Home / Self Care | Payer: Medicaid Other | Source: Ambulatory Visit | Attending: Cardiology | Admitting: Cardiology

## 2016-10-23 ENCOUNTER — Encounter (HOSPITAL_COMMUNITY): Payer: Medicaid Other

## 2016-10-23 VITALS — BP 116/68 | HR 87 | Ht 62.0 in | Wt 139.6 lb

## 2016-10-23 DIAGNOSIS — Z955 Presence of coronary angioplasty implant and graft: Secondary | ICD-10-CM | POA: Diagnosis present

## 2016-10-23 NOTE — Progress Notes (Signed)
Erin Zavala 51 y.o. female DOB: 1966-03-04 MRN: [id number]      Nutrition Note  1. 07/16/16 Status post coronary artery stent placement    Past Medical History:  Diagnosis Date  . Asthma    as a child  . Cough   . Dyspnea   . GERD (gastroesophageal reflux disease)   . Migraines   . Oxygen dependent   . Pulmonary hypertension ([place])   . Sarcoidosis    Meds reviewed. Prednisone noted  HT: Ht Readings from Last 1 Encounters:  10/23/16 _0  (1.575 m)    WT: Wt Readings from Last 3 Encounters:  10/23/16 139 lb 8.8 oz (63.3 kg)  10/15/16 138 lb 8 oz (62.8 kg)  09/26/16 140 lb 3.2 oz (63.6 kg)     BMI 25.6   Current tobacco use? No  Labs:  Lipid Panel     Component Value Date/Time   CHOL 170 10/15/2016 0958   TRIG 101 10/15/2016 0958   HDL 50 10/15/2016 0958   CHOLHDL 3.4 10/15/2016 0958   VLDL 20 10/15/2016 0958   LDLCALC 100 (H) 10/15/2016 0958    Lab Results  Component Value Date   HGBA1C 6.2 (H) 11/02/2015     6.1 (H)      06/19/2016 CBG (last 3)  No results for input(s): GLUCAP in the last 72 hours.  10/11/2016 Vitamin D Total, 25OH 21 (L) 30 - 100 ng/ml    Nutrition Note Spoke with pt. Nutrition plan and goals reviewed with pt. Pt is following Step 1 of the Therapeutic Lifestyle Changes diet. Pt expressed understanding of the information reviewed. Pt aware of nutrition education classes offered and plans on attending nutrition classes.  Nutrition Diagnosis ? Food-and nutrition-related knowledge deficit related to lack of exposure to information as related to diagnosis of: ? CVD ? Pre-diabetes  Nutrition Intervention ? Pt's individual nutrition plan and goals reviewed with pt.  Nutrition Goal(s):  ? Pt to identify and limit food sources of saturated fat, trans fat, and sodium ? Pt to decrease fried food consumed ? Pt able to name foods that affect blood glucose   Plan:  Pt to attend nutrition classes ? Nutrition I ? Nutrition II ? Portion Distortion   Will provide client-centered nutrition education as part of interdisciplinary care.   Monitor and evaluate progress toward nutrition goal with team.  Derek Mound, M.Ed, RD, LDN, CDE 10/23/2016 2:54 PM

## 2016-10-23 NOTE — Progress Notes (Signed)
Cardiac Individual Treatment Plan  Patient Details  Name: Erin Zavala MRN: [id number] Date of Birth: [birth year] Referring Provider:     [place] from 10/23/2016 in [place]  Referring Provider  Loralie Champagne, MD.      Initial Encounter Date:    CARDIAC REHAB PHASE II ORIENTATION from 10/23/2016 in [place]  Date  10/23/16  Referring Provider  Loralie Champagne, MD.      Visit Diagnosis: 07/16/16 Status post coronary artery stent placement  Patient's Home Medications on Admission:  Current Outpatient Prescriptions:  .  ambrisentan (LETAIRIS) 10 MG tablet, Take 1 tablet (10 mg total) by mouth daily., Disp: 30 tablet, Rfl: 11 .  aspirin 81 MG tablet, Take 1 tablet (81 mg total) by mouth daily., Disp: 30 tablet, Rfl: 0 .  atorvastatin (LIPITOR) 40 MG tablet, Take 1 tablet (40 mg total) by mouth daily., Disp: 30 tablet, Rfl: 3 .  beclomethasone (QVAR) 80 MCG/ACT inhaler, Inhale 2 puffs into the lungs 2 (two) times daily., Disp: 1 Inhaler, Rfl: 6 .  clopidogrel (PLAVIX) 75 MG tablet, Take 1 tablet (75 mg total) by mouth daily., Disp: 30 tablet, Rfl: 11 .  NEXIUM 40 MG capsule, TAKE 1 CAPSULE BY MOUTH DAILY AT NOON, Disp: 30 capsule, Rfl: 3 .  predniSONE (DELTASONE) 2.5 MG tablet, Take 1 tablet (2.5 mg total) by mouth daily with breakfast., Disp: 120 tablet, Rfl: 0 .  ranitidine (ZANTAC) 150 MG tablet, TAKE 1 TABLET (150 MG TOTAL) BY MOUTH AT BEDTIME., Disp: 30 tablet, Rfl: 3 .  tadalafil, PAH, (ADCIRCA) 20 MG tablet, Take 2 tablets (40 mg total) by mouth daily., Disp: 180 tablet, Rfl: 3  Past Medical History: Past Medical History:  Diagnosis Date  . Asthma    as a child  . Cough   . Dyspnea   . GERD (gastroesophageal reflux disease)   . Migraines   . Oxygen dependent   . Pulmonary hypertension ([place])   . Sarcoidosis     Tobacco Use: History  Smoking Status  . Former Smoker  . Packs/day:  0.25  . Years: 10.00  . Types: Cigarettes  . Quit date: 01/11/2011  Smokeless Tobacco  . Never Used    Labs: Recent Review Flowsheet Data    Labs for ITP Cardiac and Pulmonary Rehab Latest Ref Rng & Units 11/02/2015 12/12/2015 12/12/2015 10/15/2016   Cholestrol 0 - 200 mg/dL 218(H) - - 170   LDLCALC 0 - 99 mg/dL 164(H) - - 100(H)   HDL >40 mg/dL 40(L) - - 50   Trlycerides <150 mg/dL 68 - - 101   Hemoglobin A1c 4.8 - 5.6 % 6.2(H) - - -   PHART 7.350 - 7.450 7.412 - - -   PCO2ART 35.0 - 45.0 mmHg 34.7(L) - - -   HCO3 20.0 - 28.0 mmol/L 22.1 25.5 26.1 -   TCO2 0 - 100 mmol/L _0 -   ACIDBASEDEF 0.0 - 2.0 mmol/L 2.0 1.0 - -   O2SAT % 99.0 74.0 75.0 -      Capillary Blood Glucose: No results found for: GLUCAP   Exercise Target Goals: Date: 10/23/16  Exercise Program Goal: Individual exercise prescription set with THRR, safety & activity barriers. Participant demonstrates ability to understand and report RPE using BORG scale, to self-measure pulse accurately, and to acknowledge the importance of the exercise prescription.  Exercise Prescription Goal: Starting with aerobic activity 30 plus minutes a day, 3  days per week for initial exercise prescription. Provide home exercise prescription and guidelines that participant acknowledges understanding prior to discharge.  Activity Barriers & Risk Stratification:     Activity Barriers & Cardiac Risk Stratification - 10/23/16 0911      Activity Barriers & Cardiac Risk Stratification   Activity Barriers Other (comment)   Comments some achiness in leg region due to meds   Cardiac Risk Stratification High      6 Minute Walk:     6 Minute Walk    Row Name 10/23/16 1158         6 Minute Walk   Phase Initial     Distance 1342 feet     Walk Time 6 minutes     # of Rest Breaks 0     MPH 2.54     METS 4.59     RPE 11     Perceived Dyspnea  1     VO2 Peak 16.06     Symptoms Yes (comment)     Comments Shortness of breath      Resting HR 87 bpm     Resting BP 116/68     Max Ex. HR 144 bpm     Max Ex. BP 140/62     2 Minute Post BP 118/62       Interval Oxygen   Interval Oxygen? Yes     Baseline Oxygen Saturation % 98 %     Baseline Liters of Oxygen 3 L     3 Minute Oxygen Saturation % 88 %     3 Minute Liters of Oxygen 4 L     6 Minute Oxygen Saturation % 98 %     6 Minute Liters of Oxygen 4 L     2 Minute Post Oxygen Saturation % 99 %     2 Minute Post Liters of Oxygen 3 L        Oxygen Initial Assessment:   Oxygen Re-Evaluation:   Oxygen Discharge (Final Oxygen Re-Evaluation):   Initial Exercise Prescription:     Initial Exercise Prescription - 10/23/16 1200      Date of Initial Exercise RX and Referring Provider   Date 10/23/16   Referring Provider Loralie Champagne, MD.     Oxygen   Oxygen Continuous   Liters 4L     Treadmill   MPH 2.2   Grade 1   Minutes 10   METs 2.81     Bike   Level 1   Minutes 10   METs 4.02     NuStep   Level 2   SPM 85   Minutes 10   METs 2.5     Prescription Details   Frequency (times per week) 3   Duration Progress to 30 minutes of continuous aerobic without signs/symptoms of physical distress     Intensity   THRR 40-80% of Max Heartrate 68-136   Ratings of Perceived Exertion 11-13   Perceived Dyspnea 0-4     Progression   Progression Continue to progress workloads to maintain intensity without signs/symptoms of physical distress.     Resistance Training   Training Prescription Yes   Weight 2lbs   Reps 10-15      Perform Capillary Blood Glucose checks as needed.  Exercise Prescription Changes:   Exercise Comments:   Exercise Goals and Review:     Exercise Goals    Row Name 10/23/16 [phone]  Exercise Goals   Increase Physical Activity Yes       Intervention Develop an individualized exercise prescription for aerobic and resistive training based on initial evaluation findings, risk stratification,  comorbidities and participant's personal goals.;Provide advice, education, support and counseling about physical activity/exercise needs.       Expected Outcomes Achievement of increased cardiorespiratory fitness and enhanced flexibility, muscular endurance and strength shown through measurements of functional capacity and personal statement of participant.       Increase Strength and Stamina Yes  wean off oxygen and lose wt/increase tone in mid section       Intervention Provide advice, education, support and counseling about physical activity/exercise needs.;Develop an individualized exercise prescription for aerobic and resistive training based on initial evaluation findings, risk stratification, comorbidities and participant's personal goals.       Expected Outcomes Achievement of increased cardiorespiratory fitness and enhanced flexibility, muscular endurance and strength shown through measurements of functional capacity and personal statement of participant.          Exercise Goals Re-Evaluation :    Discharge Exercise Prescription (Final Exercise Prescription Changes):   Nutrition:  Target Goals: Understanding of nutrition guidelines, daily intake of sodium <1538m, cholesterol <2016m calories 30% from fat and 7% or less from saturated fats, daily to have 5 or more servings of fruits and vegetables.  Biometrics:     Pre Biometrics - 10/23/16 1203      Pre Biometrics   Height _0  (1.575 m)   Weight 139 lb 8.8 oz (63.3 kg)   Waist Circumference 30.5 inches   Hip Circumference 37.5 inches   Waist to Hip Ratio 0.81 %   BMI (Calculated) 25.6   Triceps Skinfold 34 mm   % Body Fat 36.3 %   Grip Strength 32 kg   Flexibility 12 in   Single Leg Stand 12 seconds       Nutrition Therapy Plan and Nutrition Goals:   Nutrition Discharge: Nutrition Scores:   Nutrition Goals Re-Evaluation:   Nutrition Goals Re-Evaluation:   Nutrition Goals Discharge (Final Nutrition Goals  Re-Evaluation):   Psychosocial: Target Goals: Acknowledge presence or absence of significant depression and/or stress, maximize coping skills, provide positive support system. Participant is able to verbalize types and ability to use techniques and skills needed for reducing stress and depression.  Initial Review & Psychosocial Screening:     Initial Psych Review & Screening - 10/23/16 1449      Initial Review   Current issues with Current Stress Concerns   Source of Stress Concerns Chronic Illness;Financial   Comments Pt reports med level of stress.  Pt is in the process of being worked up for pulmonary transplant.     Family Dynamics   Good Support System? Yes     Barriers   Psychosocial barriers to participate in program The patient should benefit from training in stress management and relaxation.     Screening Interventions   Interventions Encouraged to exercise      Quality of Life Scores:     Quality of Life - 10/23/16 1205      Quality of Life Scores   Health/Function Pre 17.53 %   Socioeconomic Pre 15.58 %   Psych/Spiritual Pre 20.57 %   Family Pre 25.9 %   GLOBAL Pre 19.09 %      PHQ-9: Recent Review Flowsheet Data    There is no flowsheet data to display.     Interpretation of Total Score  Total Score Depression  Severity:  1-4 = Minimal depression, 5-9 = Mild depression, 10-14 = Moderate depression, 15-19 = Moderately severe depression, 20-27 = Severe depression   Psychosocial Evaluation and Intervention:   Psychosocial Re-Evaluation:   Psychosocial Discharge (Final Psychosocial Re-Evaluation):   Vocational Rehabilitation: Provide vocational rehab assistance to qualifying candidates.   Vocational Rehab Evaluation & Intervention:   Education: Education Goals: Education classes will be provided on a weekly basis, covering required topics. Participant will state understanding/return demonstration of topics presented.  Learning  Barriers/Preferences:     Learning Barriers/Preferences - 10/23/16 0910      Learning Barriers/Preferences   Learning Barriers Sight   Learning Preferences Written Material;Video;Verbal Instruction;Skilled Demonstration      Education Topics: Count Your Pulse:  -Group instruction provided by verbal instruction, demonstration, patient participation and written materials to support subject.  Instructors address importance of being able to find your pulse and how to count your pulse when at home without a heart monitor.  Patients get hands on experience counting their pulse with staff help and individually.   Heart Attack, Angina, and Risk Factor Modification:  -Group instruction provided by verbal instruction, video, and written materials to support subject.  Instructors address signs and symptoms of angina and heart attacks.    Also discuss risk factors for heart disease and how to make changes to improve heart health risk factors.   Functional Fitness:  -Group instruction provided by verbal instruction, demonstration, patient participation, and written materials to support subject.  Instructors address safety measures for doing things around the house.  Discuss how to get up and down off the floor, how to pick things up properly, how to safely get out of a chair without assistance, and balance training.   Meditation and Mindfulness:  -Group instruction provided by verbal instruction, patient participation, and written materials to support subject.  Instructor addresses importance of mindfulness and meditation practice to help reduce stress and improve awareness.  Instructor also leads participants through a meditation exercise.    Stretching for Flexibility and Mobility:  -Group instruction provided by verbal instruction, patient participation, and written materials to support subject.  Instructors lead participants through series of stretches that are designed to increase flexibility  thus improving mobility.  These stretches are additional exercise for major muscle groups that are typically performed during regular warm up and cool down.   Hands Only CPR:  -Group verbal, video, and participation provides a basic overview of AHA guidelines for community CPR. Role-play of emergencies allow participants the opportunity to practice calling for help and chest compression technique with discussion of AED use.   Hypertension: -Group verbal and written instruction that provides a basic overview of hypertension including the most recent diagnostic guidelines, risk factor reduction with self-care instructions and medication management.    Nutrition I class: Heart Healthy Eating:  -Group instruction provided by PowerPoint slides, verbal discussion, and written materials to support subject matter. The instructor gives an explanation and review of the Therapeutic Lifestyle Changes diet recommendations, which includes a discussion on lipid goals, dietary fat, sodium, fiber, plant stanol/sterol esters, sugar, and the components of a well-balanced, healthy diet.   Nutrition II class: Lifestyle Skills:  -Group instruction provided by PowerPoint slides, verbal discussion, and written materials to support subject matter. The instructor gives an explanation and review of label reading, grocery shopping for heart health, heart healthy recipe modifications, and ways to make healthier choices when eating out.   Diabetes Question & Answer:  -Group instruction provided by PowerPoint  slides, verbal discussion, and written materials to support subject matter. The instructor gives an explanation and review of diabetes co-morbidities, pre- and post-prandial blood glucose goals, pre-exercise blood glucose goals, signs, symptoms, and treatment of hypoglycemia and hyperglycemia, and foot care basics.   Diabetes Blitz:  -Group instruction provided by PowerPoint slides, verbal discussion, and written  materials to support subject matter. The instructor gives an explanation and review of the physiology behind type 1 and type 2 diabetes, diabetes medications and rational behind using different medications, pre- and post-prandial blood glucose recommendations and Hemoglobin A1c goals, diabetes diet, and exercise including blood glucose guidelines for exercising safely.    Portion Distortion:  -Group instruction provided by PowerPoint slides, verbal discussion, written materials, and food models to support subject matter. The instructor gives an explanation of serving size versus portion size, changes in portions sizes over the last 20 years, and what consists of a serving from each food group.   Stress Management:  -Group instruction provided by verbal instruction, video, and written materials to support subject matter.  Instructors review role of stress in heart disease and how to cope with stress positively.     Exercising on Your Own:  -Group instruction provided by verbal instruction, power point, and written materials to support subject.  Instructors discuss benefits of exercise, components of exercise, frequency and intensity of exercise, and end points for exercise.  Also discuss use of nitroglycerin and activating EMS.  Review options of places to exercise outside of rehab.  Review guidelines for sex with heart disease.   Cardiac Drugs I:  -Group instruction provided by verbal instruction and written materials to support subject.  Instructor reviews cardiac drug classes: antiplatelets, anticoagulants, beta blockers, and statins.  Instructor discusses reasons, side effects, and lifestyle considerations for each drug class.   Cardiac Drugs II:  -Group instruction provided by verbal instruction and written materials to support subject.  Instructor reviews cardiac drug classes: angiotensin converting enzyme inhibitors (ACE-I), angiotensin II receptor blockers (ARBs), nitrates, and calcium  channel blockers.  Instructor discusses reasons, side effects, and lifestyle considerations for each drug class.   Anatomy and Physiology of the Circulatory System:  Group verbal and written instruction and models provide basic cardiac anatomy and physiology, with the coronary electrical and arterial systems. Review of: AMI, Angina, Valve disease, Heart Failure, Peripheral Artery Disease, Cardiac Arrhythmia, Pacemakers, and the ICD.   Other Education:  -Group or individual verbal, written, or video instructions that support the educational goals of the cardiac rehab program.   Knowledge Questionnaire Score:     Knowledge Questionnaire Score - 10/23/16 1205      Knowledge Questionnaire Score   Pre Score 18/24      Core Components/Risk Factors/Patient Goals at Admission:     Personal Goals and Risk Factors at Admission - 10/23/16 1215      Core Components/Risk Factors/Patient Goals on Admission    Weight Management Weight Loss;Yes   Intervention Weight Management: Develop a combined nutrition and exercise program designed to reach desired caloric intake, while maintaining appropriate intake of nutrient and fiber, sodium and fats, and appropriate energy expenditure required for the weight goal.   Expected Outcomes Weight Loss: Understanding of general recommendations for a balanced deficit meal plan, which promotes 1-2 lb weight loss per week and includes a negative energy balance of [phone] kcal/d   Lipids Yes   Intervention Provide education and support for participant on nutrition & aerobic/resistive exercise along with prescribed medications to achieve LDL <21m,  HDL >62m.   Expected Outcomes Short Term: Participant states understanding of desired cholesterol values and is compliant with medications prescribed. Participant is following exercise prescription and nutrition guidelines.;Long Term: Cholesterol controlled with medications as prescribed, with individualized exercise RX  and with personalized nutrition plan. Value goals: LDL < 752m HDL > 40 mg.   Stress Yes   Intervention Offer individual and/or small group education and counseling on adjustment to heart disease, stress management and health-related lifestyle change. Teach and support self-help strategies.;Refer participants experiencing significant psychosocial distress to appropriate mental health specialists for further evaluation and treatment. When possible, include family members and significant others in education/counseling sessions.   Expected Outcomes Short Term: Participant demonstrates changes in health-related behavior, relaxation and other stress management skills, ability to obtain effective social support, and compliance with psychotropic medications if prescribed.;Long Term: Emotional wellbeing is indicated by absence of clinically significant psychosocial distress or social isolation.   Personal Goal Other Yes   Personal Goal Lose weight in mid-section. Improve endurance. Learn more about oxygen use.   Intervention Monitor oxygen saturation during exercise to see if SaO2 is maintained above 90%. Provide counseling regarding oxygen necessity to maintain oxygen saturation. Provide exercise counseling to help with weight loss and improved endurance.   Expected Outcomes Patient will be more knowledgeable about oxygen need and necessity to maintain oxygen saturation. Lose weight as measured by waist circumference. Improved endurance as measured by 6-minute walk test distance.      Core Components/Risk Factors/Patient Goals Review:    Core Components/Risk Factors/Patient Goals at Discharge (Final Review):    ITP Comments:     ITP Comments    Row Name 10/23/16 0909           ITP Comments Medical Director, Dr. TrFransico Him        Comments:  Patient attended orientation from [id number] [id number] to review rules and guidelines for program. Completed 6 minute walk test, Intitial ITP, and exercise  prescription.  VSS. Telemetry- SR.  Asymptomatic. Brief Psychosocial Assessment - Pt reports medium level of stress related to her health and finances.  Pt is looking forward to participating in cardiac rehab. CaCherre HugerBSN Cardiac and PuTraining and development officer

## 2016-10-25 ENCOUNTER — Encounter (HOSPITAL_COMMUNITY): Payer: Medicaid Other

## 2016-10-26 ENCOUNTER — Encounter (HOSPITAL_COMMUNITY): Payer: Medicaid Other | Admitting: Cardiology

## 2016-10-29 ENCOUNTER — Encounter (HOSPITAL_COMMUNITY)
Admission: RE | Admit: 2016-10-29 | Discharge: 2016-10-29 | Disposition: A | Discharge status: Home / Self Care | Payer: Medicaid Other | Source: Ambulatory Visit | Attending: Cardiology | Admitting: Cardiology

## 2016-10-29 ENCOUNTER — Encounter (HOSPITAL_COMMUNITY): Payer: Self-pay

## 2016-10-29 DIAGNOSIS — Z955 Presence of coronary angioplasty implant and graft: Secondary | ICD-10-CM | POA: Diagnosis not present

## 2016-10-29 NOTE — Progress Notes (Addendum)
Daily Session Note  Patient Details  Name: Erin Zavala MRN: [id number] Date of Birth: 11-14-1965 Referring Provider:     [place] from 10/23/2016 in [place]  Referring Provider  Loralie Champagne, MD.      Encounter Date: 10/29/2016  Check In:     Session Check In - 10/29/16 0901      Check-In   Location [facility]   Staff Present Su Hilt, MS, ACSM RCEP, Exercise Physiologist;Joann Rion, RN, BSN;Amber Fair, MS, ACSM RCEP, Exercise Physiologist   Supervising physician immediately available to respond to emergencies [facility] immediately available   Physician(s) Dr. Wendee Beavers   Medication changes reported     No   Fall or balance concerns reported    No   Tobacco Cessation No Change   Warm-up and Cool-down Performed as group-led instruction   Resistance Training Performed Yes   VAD Patient? No     Pain Assessment   Currently in Pain? No/denies   Multiple Pain Sites No      Capillary Blood Glucose: No results found for this or any previous visit (from the past 24 hour(s)).    History  Smoking Status  . Former Smoker  . Packs/day: 0.25  . Years: 10.00  . Types: Cigarettes  . Quit date: 01/11/2011  Smokeless Tobacco  . Never Used    Goals Met:  Exercise tolerated well  Goals Unmet:  Not Applicable  Comments: Pt started cardiac rehab today.  Pt tolerated light exercise without difficulty. VSS, telemetry-sinus rhythm, asymptomatic.  Medication list reconciled. Pt denies barriers to medicaiton compliance.  PSYCHOSOCIAL ASSESSMENT:  PHQ-0. Pt does exhibit health related anxiety.  . Pt exhibits positive coping skills, hopeful outlook with supportive family. No psychosocial needs identified at this time, no psychosocial interventions necessary.    Pt enjoys reading, playing games, and singing in the choir.  Pt goals for cardiac rehab are to increase endurance.   Pt oriented to exercise  equipment and routine.    Understanding verbalized.   Dr. Fransico Him is Medical Director for Cardiac Rehab at [facility].

## 2016-10-30 ENCOUNTER — Encounter (HOSPITAL_COMMUNITY): Payer: Medicaid Other

## 2016-10-31 ENCOUNTER — Encounter (HOSPITAL_COMMUNITY)
Admission: RE | Admit: 2016-10-31 | Discharge: 2016-10-31 | Disposition: A | Discharge status: Home / Self Care | Payer: Medicaid Other | Source: Ambulatory Visit | Attending: Cardiology | Admitting: Cardiology

## 2016-10-31 DIAGNOSIS — Z955 Presence of coronary angioplasty implant and graft: Secondary | ICD-10-CM

## 2016-11-01 ENCOUNTER — Encounter (HOSPITAL_COMMUNITY): Payer: Medicaid Other

## 2016-11-02 ENCOUNTER — Encounter (HOSPITAL_COMMUNITY)
Admission: RE | Admit: 2016-11-02 | Discharge: 2016-11-02 | Disposition: A | Discharge status: Home / Self Care | Payer: Medicaid Other | Source: Ambulatory Visit | Attending: Cardiology | Admitting: Cardiology

## 2016-11-02 DIAGNOSIS — Z955 Presence of coronary angioplasty implant and graft: Secondary | ICD-10-CM | POA: Diagnosis not present

## 2016-11-05 ENCOUNTER — Encounter (HOSPITAL_COMMUNITY)
Admission: RE | Admit: 2016-11-05 | Discharge: 2016-11-05 | Disposition: A | Discharge status: Home / Self Care | Payer: Medicaid Other | Source: Ambulatory Visit | Attending: Cardiology | Admitting: Cardiology

## 2016-11-05 DIAGNOSIS — Z955 Presence of coronary angioplasty implant and graft: Secondary | ICD-10-CM | POA: Diagnosis not present

## 2016-11-06 ENCOUNTER — Telehealth: Payer: Self-pay | Admitting: Pulmonary Disease

## 2016-11-06 ENCOUNTER — Encounter (HOSPITAL_COMMUNITY): Payer: Medicaid Other

## 2016-11-06 NOTE — Telephone Encounter (Signed)
Rec'd disability forms via fax from [employer] to [employer] -pr

## 2016-11-07 ENCOUNTER — Encounter (HOSPITAL_COMMUNITY)
Admission: RE | Admit: 2016-11-07 | Discharge: 2016-11-07 | Disposition: A | Discharge status: Home / Self Care | Payer: Medicaid Other | Source: Ambulatory Visit | Attending: Cardiology | Admitting: Cardiology

## 2016-11-07 DIAGNOSIS — Z955 Presence of coronary angioplasty implant and graft: Secondary | ICD-10-CM | POA: Diagnosis not present

## 2016-11-08 ENCOUNTER — Encounter (HOSPITAL_COMMUNITY): Payer: Medicaid Other

## 2016-11-09 ENCOUNTER — Encounter (HOSPITAL_COMMUNITY)
Admission: RE | Admit: 2016-11-09 | Discharge: 2016-11-09 | Disposition: A | Discharge status: Home / Self Care | Payer: Medicaid Other | Source: Ambulatory Visit | Attending: Cardiology | Admitting: Cardiology

## 2016-11-09 DIAGNOSIS — Z955 Presence of coronary angioplasty implant and graft: Secondary | ICD-10-CM | POA: Diagnosis not present

## 2016-11-13 ENCOUNTER — Encounter (HOSPITAL_COMMUNITY): Payer: Medicaid Other

## 2016-11-14 ENCOUNTER — Encounter (HOSPITAL_COMMUNITY)
Admission: RE | Admit: 2016-11-14 | Discharge: 2016-11-14 | Disposition: A | Discharge status: Home / Self Care | Payer: Medicaid Other | Source: Ambulatory Visit | Attending: Cardiology | Admitting: Cardiology

## 2016-11-14 DIAGNOSIS — Z955 Presence of coronary angioplasty implant and graft: Secondary | ICD-10-CM | POA: Insufficient documentation

## 2016-11-15 ENCOUNTER — Encounter (HOSPITAL_COMMUNITY): Payer: Medicaid Other

## 2016-11-16 ENCOUNTER — Encounter (HOSPITAL_COMMUNITY): Admission: RE | Admit: 2016-11-16 | Payer: Medicaid Other | Source: Ambulatory Visit

## 2016-11-19 ENCOUNTER — Encounter (HOSPITAL_COMMUNITY): Payer: Medicaid Other

## 2016-11-19 ENCOUNTER — Telehealth (HOSPITAL_COMMUNITY): Payer: Self-pay | Admitting: General Practice

## 2016-11-20 ENCOUNTER — Encounter (HOSPITAL_COMMUNITY): Payer: Medicaid Other

## 2016-11-20 ENCOUNTER — Other Ambulatory Visit: Payer: Self-pay | Admitting: Pulmonary Disease

## 2016-11-21 ENCOUNTER — Encounter (HOSPITAL_COMMUNITY)
Admission: RE | Admit: 2016-11-21 | Discharge: 2016-11-21 | Disposition: A | Discharge status: Home / Self Care | Payer: Medicaid Other | Source: Ambulatory Visit | Attending: Cardiology | Admitting: Cardiology

## 2016-11-21 DIAGNOSIS — Z955 Presence of coronary angioplasty implant and graft: Secondary | ICD-10-CM | POA: Diagnosis not present

## 2016-11-21 NOTE — Progress Notes (Signed)
Reviewed home exercise with pt today.  Pt plans to go to [facility] for exercise, 2x/week in addition to coming to cardiac rehab  Reviewed THR, pulse, RPE, sign and symptoms, and when to call 911 or MD.  Also discussed weather considerations and indoor options.  Pt voiced understanding.     Erin Zavala [employer]

## 2016-11-21 NOTE — Progress Notes (Signed)
Cardiac Individual Treatment Plan  Patient Details  Name: Erin Zavala MRN: [id number] Date of Birth: 12-May-1965 Referring Provider:     [place] from 10/23/2016 in [place]  Referring Provider  Loralie Champagne, MD.      Initial Encounter Date:    CARDIAC REHAB PHASE II ORIENTATION from 10/23/2016 in [place]  Date  10/23/16  Referring Provider  Loralie Champagne, MD.      Visit Diagnosis: 07/16/16 Status post coronary artery stent placement  Patient's Home Medications on Admission:  Current Outpatient Prescriptions:  .  ambrisentan (LETAIRIS) 10 MG tablet, Take 1 tablet (10 mg total) by mouth daily., Disp: 30 tablet, Rfl: 11 .  aspirin 81 MG tablet, Take 1 tablet (81 mg total) by mouth daily., Disp: 30 tablet, Rfl: 0 .  atorvastatin (LIPITOR) 40 MG tablet, Take 1 tablet (40 mg total) by mouth daily., Disp: 30 tablet, Rfl: 3 .  beclomethasone (QVAR) 80 MCG/ACT inhaler, Inhale 2 puffs into the lungs 2 (two) times daily., Disp: 1 Inhaler, Rfl: 6 .  clopidogrel (PLAVIX) 75 MG tablet, Take 1 tablet (75 mg total) by mouth daily., Disp: 30 tablet, Rfl: 11 .  NEXIUM 40 MG capsule, TAKE 1 CAPSULE BY MOUTH DAILY AT NOON, Disp: 30 capsule, Rfl: 3 .  predniSONE (DELTASONE) 2.5 MG tablet, Take 1 tablet (2.5 mg total) by mouth daily with breakfast., Disp: 120 tablet, Rfl: 0 .  ranitidine (ZANTAC) 150 MG tablet, TAKE 1 TABLET (150 MG TOTAL) BY MOUTH AT BEDTIME., Disp: 30 tablet, Rfl: 3 .  tadalafil, PAH, (ADCIRCA) 20 MG tablet, Take 2 tablets (40 mg total) by mouth daily., Disp: 180 tablet, Rfl: 3  Past Medical History: Past Medical History:  Diagnosis Date  . Asthma    as a child  . Cough   . Dyspnea   . GERD (gastroesophageal reflux disease)   . Migraines   . Oxygen dependent   . Pulmonary hypertension ([facility])   . Sarcoidosis     Tobacco Use: History  Smoking Status  . Former Smoker  . Packs/day:  0.25  . Years: 10.00  . Types: Cigarettes  . Quit date: 01/11/2011  Smokeless Tobacco  . Never Used    Labs: Recent Review Flowsheet Data    Labs for ITP Cardiac and Pulmonary Rehab Latest Ref Rng & Units 11/02/2015 12/12/2015 12/12/2015 10/15/2016   Cholestrol 0 - 200 mg/dL 218(H) - - 170   LDLCALC 0 - 99 mg/dL 164(H) - - 100(H)   HDL >40 mg/dL 40(L) - - 50   Trlycerides <150 mg/dL 68 - - 101   Hemoglobin A1c 4.8 - 5.6 % 6.2(H) - - -   PHART 7.350 - 7.450 7.412 - - -   PCO2ART 35.0 - 45.0 mmHg 34.7(L) - - -   HCO3 20.0 - 28.0 mmol/L 22.1 25.5 26.1 -   TCO2 0 - 100 mmol/L _0 -   ACIDBASEDEF 0.0 - 2.0 mmol/L 2.0 1.0 - -   O2SAT % 99.0 74.0 75.0 -      Capillary Blood Glucose: No results found for: GLUCAP   Exercise Target Goals:    Exercise Program Goal: Individual exercise prescription set with THRR, safety & activity barriers. Participant demonstrates ability to understand and report RPE using BORG scale, to self-measure pulse accurately, and to acknowledge the importance of the exercise prescription.  Exercise Prescription Goal: Starting with aerobic activity 30 plus minutes a day, 3  days per week for initial exercise prescription. Provide home exercise prescription and guidelines that participant acknowledges understanding prior to discharge.  Activity Barriers & Risk Stratification:     Activity Barriers & Cardiac Risk Stratification - 10/23/16 0911      Activity Barriers & Cardiac Risk Stratification   Activity Barriers Other (comment)   Comments some achiness in leg region due to meds   Cardiac Risk Stratification High      6 Minute Walk:     6 Minute Walk    Row Name 10/23/16 1158         6 Minute Walk   Phase Initial     Distance 1342 feet     Walk Time 6 minutes     # of Rest Breaks 0     MPH 2.54     METS 4.59     RPE 11     Perceived Dyspnea  1     VO2 Peak 16.06     Symptoms Yes (comment)     Comments Shortness of breath     Resting HR  87 bpm     Resting BP 116/68     Max Ex. HR 144 bpm     Max Ex. BP 140/62     2 Minute Post BP 118/62       Interval Oxygen   Interval Oxygen? Yes     Baseline Oxygen Saturation % 98 %     Resting Liters of Oxygen 3 L     3 Minute Oxygen Saturation % 88 %     3 Minute Liters of Oxygen 4 L     6 Minute Oxygen Saturation % 98 %     6 Minute Liters of Oxygen 4 L     2 Minute Post Oxygen Saturation % 99 %     2 Minute Post Liters of Oxygen 3 L        Oxygen Initial Assessment:   Oxygen Re-Evaluation:   Oxygen Discharge (Final Oxygen Re-Evaluation):   Initial Exercise Prescription:     Initial Exercise Prescription - 10/23/16 1200      Date of Initial Exercise RX and Referring Provider   Date 10/23/16   Referring Provider Loralie Champagne, MD.     Oxygen   Oxygen Continuous   Liters 4L     Treadmill   MPH 2.2   Grade 1   Minutes 10   METs 2.81     Bike   Level 1   Minutes 10   METs 4.02     NuStep   Level 2   SPM 85   Minutes 10   METs 2.5     Prescription Details   Frequency (times per week) 3   Duration Progress to 30 minutes of continuous aerobic without signs/symptoms of physical distress     Intensity   THRR 40-80% of Max Heartrate 68-136   Ratings of Perceived Exertion 11-13   Perceived Dyspnea 0-4     Progression   Progression Continue to progress workloads to maintain intensity without signs/symptoms of physical distress.     Resistance Training   Training Prescription Yes   Weight 2lbs   Reps 10-15      Perform Capillary Blood Glucose checks as needed.  Exercise Prescription Changes:     Exercise Prescription Changes    Row Name 10/29/16 1543 11/09/16 1541 11/20/16 1500         Response to Exercise   Blood Pressure (Admit) 104/68  112/60 104/68     Blood Pressure (Exercise) 136/80 132/60 118/70     Blood Pressure (Exit) 118/82 108/62 120/66     Heart Rate (Admit) 82 bpm 96 bpm 91 bpm     Heart Rate (Exercise) 150 bpm 134 bpm  136 bpm     Heart Rate (Exit) 80 bpm 88 bpm 89 bpm     Oxygen Saturation (Admit)  -  - 99 %     Oxygen Saturation (Exercise)  -  - 92 %     Oxygen Saturation (Exit)  -  - 100 %     Rating of Perceived Exertion (Exercise) _0 Perceived Dyspnea (Exercise)  -  - 1     Symptoms none none none     Comments pt encouraged to take rest breaks and PLB  -  -     Duration Continue with 30 min of aerobic exercise without signs/symptoms of physical distress. Continue with 30 min of aerobic exercise without signs/symptoms of physical distress. Continue with 30 min of aerobic exercise without signs/symptoms of physical distress.     Intensity THRR unchanged THRR unchanged THRR unchanged       Progression   Progression Continue to progress workloads to maintain intensity without signs/symptoms of physical distress. Continue to progress workloads to maintain intensity without signs/symptoms of physical distress. Continue to progress workloads to maintain intensity without signs/symptoms of physical distress.     Average METs 2.5 3 3.5       Resistance Training   Training Prescription Yes Yes Yes     Weight 2lbs 4lbs 4lbs     Reps 10-15 10-15 10-15     Time 10 Minutes 10 Minutes 10 Minutes       Oxygen   Oxygen Continuous Continuous Continuous     Liters 4L 4L 4L       Treadmill   MPH 2.2 2.2 2.2     Grade _1 Minutes _2 METs 2.99 2.99 2.99       Recumbant Bike   Level _3 Minutes _4 METs 2.1 3.1 2.9       NuStep   Level _5 SPM 85 90 90     Minutes _6 METs 2.4 3.4 4.1        Exercise Comments:     Exercise Comments    Row Name 11/20/16 1544           Exercise Comments Reviewed METs and goals. Pt is making great progress in cardiac rehab and able to exercise without being limited with SOB symptoms; will continue to monitor activty levels and exercise progression.          Exercise Goals and Review:     Exercise Goals     Row Name 10/23/16 0911             Exercise Goals   Increase Physical Activity Yes       Intervention Develop an individualized exercise prescription for aerobic and resistive training based on initial evaluation findings, risk stratification, comorbidities and participant's personal goals.;Provide advice, education, support and counseling about physical activity/exercise needs.       Expected Outcomes Achievement of increased cardiorespiratory fitness and enhanced flexibility, muscular endurance and strength shown through measurements of functional capacity  and personal statement of participant.       Increase Strength and Stamina Yes  wean off oxygen and lose wt/increase tone in mid section       Intervention Provide advice, education, support and counseling about physical activity/exercise needs.;Develop an individualized exercise prescription for aerobic and resistive training based on initial evaluation findings, risk stratification, comorbidities and participant's personal goals.       Expected Outcomes Achievement of increased cardiorespiratory fitness and enhanced flexibility, muscular endurance and strength shown through measurements of functional capacity and personal statement of participant.          Exercise Goals Re-Evaluation :     Exercise Goals Re-Evaluation    Row Name 11/20/16 1545 11/21/16 0943           Exercise Goal Re-Evaluation   Exercise Goals Review Increase Physical Activity;Able to understand and use rate of perceived exertion (RPE) scale;Knowledge and understanding of Target Heart Rate Range (THRR);Increase Strength and Stamina;Able to understand and use Dyspnea scale;Able to check pulse independently;Understanding of Exercise Prescription  -      Comments Pt handles exercise WL increases very well. pt is able to exercise for 30 minutes with very little rest breaks for recovery.  Reviewed home exercise with pt today.  Pt plans to go to [facility] for exercise,  2x/week in addition to coming to cardiac rehab  Reviewed THR, pulse, RPE, sign and symptoms, and when to call 911 or MD.  Also discussed weather considerations and indoor options.  Pt voiced understanding.      Expected Outcomes Pt will continue to improve in cardiorespiratory fitness  Pt will continue to improve in cardiorespiratory fitness           Discharge Exercise Prescription (Final Exercise Prescription Changes):     Exercise Prescription Changes - 11/20/16 1500      Response to Exercise   Blood Pressure (Admit) 104/68   Blood Pressure (Exercise) 118/70   Blood Pressure (Exit) 120/66   Heart Rate (Admit) 91 bpm   Heart Rate (Exercise) 136 bpm   Heart Rate (Exit) 89 bpm   Oxygen Saturation (Admit) 99 %   Oxygen Saturation (Exercise) 92 %   Oxygen Saturation (Exit) 100 %   Rating of Perceived Exertion (Exercise) 12   Perceived Dyspnea (Exercise) 1   Symptoms none   Duration Continue with 30 min of aerobic exercise without signs/symptoms of physical distress.   Intensity THRR unchanged     Progression   Progression Continue to progress workloads to maintain intensity without signs/symptoms of physical distress.   Average METs 3.5     Resistance Training   Training Prescription Yes   Weight 4lbs   Reps 10-15   Time 10 Minutes     Oxygen   Oxygen Continuous   Liters 4L     Treadmill   MPH 2.2   Grade 1   Minutes 10   METs 2.99     Recumbant Bike   Level 2   Minutes 10   METs 2.9     NuStep   Level 3   SPM 90   Minutes 10   METs 4.1      Nutrition:  Target Goals: Understanding of nutrition guidelines, daily intake of sodium <1519m, cholesterol <2059m calories 30% from fat and 7% or less from saturated fats, daily to have 5 or more servings of fruits and vegetables.  Biometrics:     Pre Biometrics - 10/23/16 1203      Pre  Biometrics   Height _0  (1.575 m)   Weight 139 lb 8.8 oz (63.3 kg)   Waist Circumference 30.5 inches   Hip Circumference  37.5 inches   Waist to Hip Ratio 0.81 %   BMI (Calculated) 25.6   Triceps Skinfold 34 mm   % Body Fat 36.3 %   Grip Strength 32 kg   Flexibility 12 in   Single Leg Stand 12 seconds       Nutrition Therapy Plan and Nutrition Goals:   Nutrition Discharge: Nutrition Scores:     Nutrition Assessments - 10/23/16 1510      MEDFICTS Scores   Pre Score 65      Nutrition Goals Re-Evaluation:   Nutrition Goals Re-Evaluation:   Nutrition Goals Discharge (Final Nutrition Goals Re-Evaluation):   Psychosocial: Target Goals: Acknowledge presence or absence of significant depression and/or stress, maximize coping skills, provide positive support system. Participant is able to verbalize types and ability to use techniques and skills needed for reducing stress and depression.  Initial Review & Psychosocial Screening:     Initial Psych Review & Screening - 10/23/16 1449      Initial Review   Current issues with Current Stress Concerns   Source of Stress Concerns Chronic Illness;Financial   Comments Pt reports med level of stress.  Pt is in the process of being worked up for pulmonary transplant.     Family Dynamics   Good Support System? Yes     Barriers   Psychosocial barriers to participate in program The patient should benefit from training in stress management and relaxation.     Screening Interventions   Interventions Encouraged to exercise      Quality of Life Scores:     Quality of Life - 11/02/16 1700      Quality of Life Scores   Health/Function Pre --  pt with sarcoidosis, home oxygen demonstratesconcernsabout her health. although pt also demonstrates hopeful outlook with good coping skills.    Socioeconomic Pre --  pt has recently received disability.  however pt interested in voc rehab. packet will be given.    GLOBAL Pre --  overall scores reflective of pt struggles from her disabling chronic illness. pt offered emotional support and reassurance. will  continue to monitor.       PHQ-9: Recent Review Flowsheet Data    Depression screen [facility] 2/9 11/02/2016   Decreased Interest 0   Down, Depressed, Hopeless 0   PHQ - 2 Score 0     Interpretation of Total Score  Total Score Depression Severity:  1-4 = Minimal depression, 5-9 = Mild depression, 10-14 = Moderate depression, 15-19 = Moderately severe depression, 20-27 = Severe depression   Psychosocial Evaluation and Intervention:     Psychosocial Evaluation - 10/29/16 1655      Psychosocial Evaluation & Interventions   Interventions Encouraged to exercise with the program and follow exercise prescription;Stress management education;Relaxation education   Comments  pt with health related anxiety.  pt has suppotive family and friends.     Expected Outcomes pt will demonstrate improved outlook with good coping skills.    Continue Psychosocial Services  Follow up required by staff      Psychosocial Re-Evaluation:     Psychosocial Re-Evaluation    [place] Name 11/21/16 1454             Psychosocial Re-Evaluation   Current issues with Current Stress Concerns;None Identified       Comments pt demonstrates improving health related  anxiety.  pt demonstrates improved coping skills and outlook.         Expected Outcomes pt will exhibit positive outlook with good coping skills.        Interventions Encouraged to attend Cardiac Rehabilitation for the exercise;Stress management education       Continue Psychosocial Services  No Follow up required          Psychosocial Discharge (Final Psychosocial Re-Evaluation):     Psychosocial Re-Evaluation - 11/21/16 1454      Psychosocial Re-Evaluation   Current issues with Current Stress Concerns;None Identified   Comments pt demonstrates improving health related anxiety.  pt demonstrates improved coping skills and outlook.     Expected Outcomes pt will exhibit positive outlook with good coping skills.    Interventions Encouraged to attend  Cardiac Rehabilitation for the exercise;Stress management education   Continue Psychosocial Services  No Follow up required      Vocational Rehabilitation: Provide vocational rehab assistance to qualifying candidates.   Vocational Rehab Evaluation & Intervention:     Vocational Rehab - 11/09/16 1657      Initial Vocational Rehab Evaluation & Intervention   Assessment shows need for Vocational Rehabilitation Yes   Vocational Rehab Packet given to patient 11/09/16      Education: Education Goals: Education classes will be provided on a weekly basis, covering required topics. Participant will state understanding/return demonstration of topics presented.  Learning Barriers/Preferences:     Learning Barriers/Preferences - 10/23/16 0910      Learning Barriers/Preferences   Learning Barriers Sight   Learning Preferences Written Material;Video;Verbal Instruction;Skilled Demonstration      Education Topics: Count Your Pulse:  -Group instruction provided by verbal instruction, demonstration, patient participation and written materials to support subject.  Instructors address importance of being able to find your pulse and how to count your pulse when at home without a heart monitor.  Patients get hands on experience counting their pulse with staff help and individually.   Heart Attack, Angina, and Risk Factor Modification:  -Group instruction provided by verbal instruction, video, and written materials to support subject.  Instructors address signs and symptoms of angina and heart attacks.    Also discuss risk factors for heart disease and how to make changes to improve heart health risk factors.   Functional Fitness:  -Group instruction provided by verbal instruction, demonstration, patient participation, and written materials to support subject.  Instructors address safety measures for doing things around the house.  Discuss how to get up and down off the floor, how to pick things  up properly, how to safely get out of a chair without assistance, and balance training.   Meditation and Mindfulness:  -Group instruction provided by verbal instruction, patient participation, and written materials to support subject.  Instructor addresses importance of mindfulness and meditation practice to help reduce stress and improve awareness.  Instructor also leads participants through a meditation exercise.    Stretching for Flexibility and Mobility:  -Group instruction provided by verbal instruction, patient participation, and written materials to support subject.  Instructors lead participants through series of stretches that are designed to increase flexibility thus improving mobility.  These stretches are additional exercise for major muscle groups that are typically performed during regular warm up and cool down.   CARDIAC REHAB PHASE II EXERCISE from 11/07/2016 in [place]  Date  11/02/16  Instruction Review Code  2- meets goals/outcomes      Hands Only CPR:  -Group  verbal, video, and participation provides a basic overview of AHA guidelines for community CPR. Role-play of emergencies allow participants the opportunity to practice calling for help and chest compression technique with discussion of AED use.   Hypertension: -Group verbal and written instruction that provides a basic overview of hypertension including the most recent diagnostic guidelines, risk factor reduction with self-care instructions and medication management.    Nutrition I class: Heart Healthy Eating:  -Group instruction provided by PowerPoint slides, verbal discussion, and written materials to support subject matter. The instructor gives an explanation and review of the Therapeutic Lifestyle Changes diet recommendations, which includes a discussion on lipid goals, dietary fat, sodium, fiber, plant stanol/sterol esters, sugar, and the components of a well-balanced, healthy  diet.   Nutrition II class: Lifestyle Skills:  -Group instruction provided by PowerPoint slides, verbal discussion, and written materials to support subject matter. The instructor gives an explanation and review of label reading, grocery shopping for heart health, heart healthy recipe modifications, and ways to make healthier choices when eating out.   Diabetes Question & Answer:  -Group instruction provided by PowerPoint slides, verbal discussion, and written materials to support subject matter. The instructor gives an explanation and review of diabetes co-morbidities, pre- and post-prandial blood glucose goals, pre-exercise blood glucose goals, signs, symptoms, and treatment of hypoglycemia and hyperglycemia, and foot care basics.   Diabetes Blitz:  -Group instruction provided by PowerPoint slides, verbal discussion, and written materials to support subject matter. The instructor gives an explanation and review of the physiology behind type 1 and type 2 diabetes, diabetes medications and rational behind using different medications, pre- and post-prandial blood glucose recommendations and Hemoglobin A1c goals, diabetes diet, and exercise including blood glucose guidelines for exercising safely.    Portion Distortion:  -Group instruction provided by PowerPoint slides, verbal discussion, written materials, and food models to support subject matter. The instructor gives an explanation of serving size versus portion size, changes in portions sizes over the last 20 years, and what consists of a serving from each food group.   CARDIAC REHAB PHASE II EXERCISE from 11/07/2016 in [place]  Date  10/31/16  Educator  RD  Instruction Review Code  2- meets goals/outcomes      Stress Management:  -Group instruction provided by verbal instruction, video, and written materials to support subject matter.  Instructors review role of stress in heart disease and how to cope  with stress positively.     CARDIAC REHAB PHASE II EXERCISE from 11/07/2016 in [place]  Date  11/07/16  Instruction Review Code  2- meets goals/outcomes      Exercising on Your Own:  -Group instruction provided by verbal instruction, power point, and written materials to support subject.  Instructors discuss benefits of exercise, components of exercise, frequency and intensity of exercise, and end points for exercise.  Also discuss use of nitroglycerin and activating EMS.  Review options of places to exercise outside of rehab.  Review guidelines for sex with heart disease.   Cardiac Drugs I:  -Group instruction provided by verbal instruction and written materials to support subject.  Instructor reviews cardiac drug classes: antiplatelets, anticoagulants, beta blockers, and statins.  Instructor discusses reasons, side effects, and lifestyle considerations for each drug class.   Cardiac Drugs II:  -Group instruction provided by verbal instruction and written materials to support subject.  Instructor reviews cardiac drug classes: angiotensin converting enzyme inhibitors (ACE-I), angiotensin II receptor blockers (ARBs),  nitrates, and calcium channel blockers.  Instructor discusses reasons, side effects, and lifestyle considerations for each drug class.   Anatomy and Physiology of the Circulatory System:  Group verbal and written instruction and models provide basic cardiac anatomy and physiology, with the coronary electrical and arterial systems. Review of: AMI, Angina, Valve disease, Heart Failure, Peripheral Artery Disease, Cardiac Arrhythmia, Pacemakers, and the ICD.   Other Education:  -Group or individual verbal, written, or video instructions that support the educational goals of the cardiac rehab program.   Knowledge Questionnaire Score:     Knowledge Questionnaire Score - 10/23/16 1205      Knowledge Questionnaire Score   Pre Score 18/24       Core Components/Risk Factors/Patient Goals at Admission:     Personal Goals and Risk Factors at Admission - 10/23/16 1215      Core Components/Risk Factors/Patient Goals on Admission    Weight Management Weight Loss;Yes   Intervention Weight Management: Develop a combined nutrition and exercise program designed to reach desired caloric intake, while maintaining appropriate intake of nutrient and fiber, sodium and fats, and appropriate energy expenditure required for the weight goal.   Expected Outcomes Weight Loss: Understanding of general recommendations for a balanced deficit meal plan, which promotes 1-2 lb weight loss per week and includes a negative energy balance of [phone] kcal/d   Lipids Yes   Intervention Provide education and support for participant on nutrition & aerobic/resistive exercise along with prescribed medications to achieve LDL <52m, HDL >452m   Expected Outcomes Short Term: Participant states understanding of desired cholesterol values and is compliant with medications prescribed. Participant is following exercise prescription and nutrition guidelines.;Long Term: Cholesterol controlled with medications as prescribed, with individualized exercise RX and with personalized nutrition plan. Value goals: LDL < 7018mHDL > 40 mg.   Stress Yes   Intervention Offer individual and/or small group education and counseling on adjustment to heart disease, stress management and health-related lifestyle change. Teach and support self-help strategies.;Refer participants experiencing significant psychosocial distress to appropriate mental health specialists for further evaluation and treatment. When possible, include family members and significant others in education/counseling sessions.   Expected Outcomes Short Term: Participant demonstrates changes in health-related behavior, relaxation and other stress management skills, ability to obtain effective social support, and compliance with  psychotropic medications if prescribed.;Long Term: Emotional wellbeing is indicated by absence of clinically significant psychosocial distress or social isolation.   Personal Goal Other Yes   Personal Goal Lose weight in mid-section. Improve endurance. Learn more about oxygen use.   Intervention Monitor oxygen saturation during exercise to see if SaO2 is maintained above 90%. Provide counseling regarding oxygen necessity to maintain oxygen saturation. Provide exercise counseling to help with weight loss and improved endurance.   Expected Outcomes Patient will be more knowledgeable about oxygen need and necessity to maintain oxygen saturation. Lose weight as measured by waist circumference. Improved endurance as measured by 6-minute walk test distance.      Core Components/Risk Factors/Patient Goals Review:      Goals and Risk Factor Review    Row Name 11/21/16 1450             Core Components/Risk Factors/Patient Goals Review   Personal Goals Review Weight Management/Obesity;Lipids;Stress;Other       Review pt demonstrates eagerness to participate in CR exercise, nutrition and education.  pt feels more comfortable with oxygen therapy during exercise.         Expected Outcomes pt will participate in exercise,  nutrition and lifestyle modifications to reduce overall RF.            Core Components/Risk Factors/Patient Goals at Discharge (Final Review):      Goals and Risk Factor Review - 11/21/16 1450      Core Components/Risk Factors/Patient Goals Review   Personal Goals Review Weight Management/Obesity;Lipids;Stress;Other   Review pt demonstrates eagerness to participate in CR exercise, nutrition and education.  pt feels more comfortable with oxygen therapy during exercise.     Expected Outcomes pt will participate in exercise, nutrition and lifestyle modifications to reduce overall RF.        ITP Comments:     ITP Comments    Row Name 10/23/16 0909 11/21/16 1447          ITP Comments Medical Director, Dr. Fransico Him Medical Director, Dr. Fransico Him         Comments Pt is making expected progress toward personal goals after completing 8 sessions. Recommend continued exercise and life style modification education including  stress management and relaxation techniques to decrease cardiac risk profile.

## 2016-11-22 ENCOUNTER — Encounter (HOSPITAL_COMMUNITY): Payer: Medicaid Other

## 2016-11-23 ENCOUNTER — Encounter (HOSPITAL_COMMUNITY)
Admission: RE | Admit: 2016-11-23 | Discharge: 2016-11-23 | Disposition: A | Discharge status: Home / Self Care | Payer: Medicaid Other | Source: Ambulatory Visit | Attending: Cardiology | Admitting: Cardiology

## 2016-11-23 DIAGNOSIS — Z955 Presence of coronary angioplasty implant and graft: Secondary | ICD-10-CM

## 2016-11-26 ENCOUNTER — Encounter (HOSPITAL_COMMUNITY): Payer: Medicaid Other

## 2016-11-27 ENCOUNTER — Encounter (HOSPITAL_COMMUNITY): Payer: Medicaid Other

## 2016-11-28 ENCOUNTER — Ambulatory Visit: Payer: Medicaid Other

## 2016-11-28 ENCOUNTER — Ambulatory Visit (INDEPENDENT_AMBULATORY_CARE_PROVIDER_SITE_OTHER): Payer: Medicaid Other | Admitting: Pulmonary Disease

## 2016-11-28 ENCOUNTER — Encounter (HOSPITAL_COMMUNITY)
Admission: RE | Admit: 2016-11-28 | Discharge: 2016-11-28 | Disposition: A | Discharge status: Home / Self Care | Payer: Medicaid Other | Source: Ambulatory Visit | Attending: Cardiology | Admitting: Cardiology

## 2016-11-28 DIAGNOSIS — Z955 Presence of coronary angioplasty implant and graft: Secondary | ICD-10-CM

## 2016-11-28 DIAGNOSIS — D869 Sarcoidosis, unspecified: Secondary | ICD-10-CM

## 2016-11-28 LAB — PULMONARY FUNCTION TEST
DL/VA % PRED: 51 %
DL/VA: 2.17 ml/min/mmHg/L
DLCO COR % PRED: 27 %
DLCO cor: 5.23 ml/min/mmHg
DLCO unc % pred: 27 %
DLCO unc: 5.26 ml/min/mmHg
FEF 25-75 Pre: 1.85 L/sec
FEF2575-%Pred-Pre: 86 %
FEV1-%Pred-Pre: 74 %
FEV1-PRE: 1.46 L
FEV1FVC-%Pred-Pre: 106 %
FEV6-%Pred-Pre: 71 %
FEV6-Pre: 1.69 L
FEV6FVC-%PRED-PRE: 103 %
FVC-%PRED-PRE: 68 %
FVC-PRE: 1.69 L
PRE FEV1/FVC RATIO: 87 %
Pre FEV6/FVC Ratio: 100 %

## 2016-11-28 NOTE — Progress Notes (Signed)
PFT done today.

## 2016-11-29 ENCOUNTER — Ambulatory Visit (INDEPENDENT_AMBULATORY_CARE_PROVIDER_SITE_OTHER): Payer: Medicaid Other | Admitting: *Deleted

## 2016-11-29 ENCOUNTER — Ambulatory Visit (INDEPENDENT_AMBULATORY_CARE_PROVIDER_SITE_OTHER): Payer: Medicaid Other | Admitting: Pulmonary Disease

## 2016-11-29 ENCOUNTER — Other Ambulatory Visit (INDEPENDENT_AMBULATORY_CARE_PROVIDER_SITE_OTHER): Payer: Medicaid Other

## 2016-11-29 ENCOUNTER — Ambulatory Visit: Payer: Medicaid Other | Admitting: Pulmonary Disease

## 2016-11-29 ENCOUNTER — Encounter (HOSPITAL_COMMUNITY): Payer: Medicaid Other

## 2016-11-29 ENCOUNTER — Encounter: Payer: Self-pay | Admitting: Pulmonary Disease

## 2016-11-29 VITALS — BP 130/70 | HR 74 | Ht 60.0 in | Wt 137.2 lb

## 2016-11-29 DIAGNOSIS — J9611 Chronic respiratory failure with hypoxia: Secondary | ICD-10-CM

## 2016-11-29 DIAGNOSIS — D869 Sarcoidosis, unspecified: Secondary | ICD-10-CM

## 2016-11-29 DIAGNOSIS — I272 Pulmonary hypertension, unspecified: Secondary | ICD-10-CM | POA: Diagnosis not present

## 2016-11-29 DIAGNOSIS — K219 Gastro-esophageal reflux disease without esophagitis: Secondary | ICD-10-CM | POA: Diagnosis not present

## 2016-11-29 DIAGNOSIS — Z23 Encounter for immunization: Secondary | ICD-10-CM | POA: Diagnosis not present

## 2016-11-29 DIAGNOSIS — I2729 Other secondary pulmonary hypertension: Secondary | ICD-10-CM | POA: Diagnosis not present

## 2016-11-29 LAB — CBC
HCT: 29.3 % — ABNORMAL LOW (ref 36.0–46.0)
MCHC: 29.9 g/dL — ABNORMAL LOW (ref 30.0–36.0)
MCV: 71.4 fl — ABNORMAL LOW (ref 78.0–100.0)
PLATELETS: 466 10*3/uL — AB (ref 150.0–400.0)
RBC: 4.1 Mil/uL (ref 3.87–5.11)
RDW: 20.1 % — ABNORMAL HIGH (ref 11.5–15.5)
WBC: 8.6 10*3/uL (ref 4.0–10.5)

## 2016-11-29 NOTE — Progress Notes (Signed)
SIX MIN WALK 11/29/2016 11/29/2016 09/26/2016 09/26/2016 07/23/2016 05/18/2016 05/03/2016  Medications - Letairis 10. Aspirin 81, Lipitor 75m, Qvar 845m, Plavix 7571mNexium 24m31mrednisone 2.5mg 36mdirca 20mg 23mtaken at 7:12am ASA 81mg,l48mor 20mg,qv43m0,plavix 75mg,nex67m40,prednisone 2.5mg all t74mn between 8:30am-9am - - - asa 81, qvar 80mcg, nex96m24mg, Ensur41mrednisone 10mg - ALL a64mm / Adcirca 20mg and Opsu77m- both at 5am  Supplimental Oxygen during Test? (L/min) - Yes Yes No No Yes Yes  O2 Flow Rate _0 - - 4 3  Type Continuous Continuous Continuous - - Continuous Continuous  Laps - 6 6 - - - 6  Partial Lap (in Meters) - 24 45 - - - 32  Baseline BP (sitting) - 120/68 118/62 - - - 112/62  Baseline Heartrate - 98 88 125 - 104 97  Baseline Dyspnea (Borg Scale) - 0 0 - - - 3  Baseline Fatigue (Borg Scale) - 0 0 - - - 0  Baseline SPO2 - 100 100 88 - 92 99  BP (sitting) 126/70 - 122/64 - - - 140/70  Heartrate 125 - 128 - - 111 150  Dyspnea (Borg Scale) 2 - 2 - - - 4  Fatigue (Borg Scale) 0 - 0 - - - 0  SPO2 95 - 89 - - 90 68  BP (sitting) 122/68 - 120/60 - - - 110/60  Heartrate 93 - 87 - - 98 108  SPO2 100 - 100 - - 96 78  Stopped or Paused before Six Minutes Yes - Yes - - Yes No  Other Symptoms at end of Exercise test paused with 1:50 left at 273 meters to increase oxygen to 3L due to pt's O2 dropping to 87 HR 144-  test resumed once O2 increased to 94% HR 116. - Pt walked 2 full laps (48 meters) before she was stopped due to sats dropping to 86% on 2L she had 3 mins and 38 secs left. She was then bumped up to 3L sats 95% and walk proceeded - - Patient started walk on 2 L O2, desatted to 70's after 2-3 minutes, O2 up to 4L, sats came to 90's. Minimally SOB, toelrated well, steady gait and pace. -  Distance Completed - 312 333 - - - 320  Tech Comments: pt completed test at moderated pace with no complaints. test performed with forehead probe. test paused with 1:50 left at 273 meters  left to increase oxygen to 3L due to pt's O2 dropping to 87 HR 144- O2 increased to 94% HR 116 on 3L. O2 remained over 90% remaining of test. TOTAL 312 - Pt walked a steady pace with her oxygen tank, she did not express any dyspnea during or after the walk nor did she express any discomfort  Pt sats were on RA at rest while in lobby were 93% 113 HR, Pt remained off oxygen and walked to back hallway for walk and sats dropped to 88% HR 125, from there was started on oxygen  Patient walked from scale to room 9A with steady gait and moderate pace.  256 meters (840 ft) Pt walked at normal pace (per pt); patient did desat to 68% at the end of the 6MW and sat for 10 minutes to recover on the 3 liters before O2 being titrated. O2 titration was unable to be completed due topatient being too SOB. Pt reported being sick, onset about 3 days ago with cough, SOB, chills and body aches at times with little  sore throat. Pt was adamant on doing the 6MW so we attempted it. Pt's O2 sats remained in upper 80s saturations 2 minutes post walk and O2 was increased to 4 liters which brought O2 to 95%. Pt seemed to be having a hard time after the test moving oxygen and stated that she was tight and felt congested. Spoke with Dr Ashok Cordia and he agreed that the test wewould end the test and the patient was placed in an exam room. PFT not performed today d/t being sick.   -amg

## 2016-11-29 NOTE — Patient Instructions (Addendum)
Remember to angle your Flonase nasal spray outward when you use it (llike you're shooting your eyeball from the inside).  Please let us know if you feel your coughing or breathing is getting worse. We will see you back sooner if needed.  If you want to try to go to 1 reflux medicine, restart your Zantac and try to wean off the Nexium - take 1 Nexium every other day for two weeks & then 1 on Monday/Wednesday/Friday for 2 weeks before you stop. If you have any reflux on using just the Zantac daily then go back to your current regimen.  Remember to get your Flu Vaccine next month.  We will do a repeat breathing and walking test at your next appointment.  TESTS ORDERED: 1. Serum ESR, CRP, CBC, CMP, & ACE today 2. Spirometry with DLCO at next appointment 3. 6MWT with oxygen titration at next appointment - start on oxygen at 2 L/m.

## 2016-11-29 NOTE — Progress Notes (Deleted)
Subjective:    Patient ID: Erin Zavala, female    DOB: 1965-06-28, 51 y.o.   MRN: [id number]  C.C.:  Follow-up for Sarcoidosis, Chronic Hypoxic Respiratory Failure, Moderate Pulmonary Hypertension, & GERD.  HPI  Sarcoidosis: Patient has lung and eye involvement. Biopsy confirmed 01/16/16. Prescribed Qvar 160 g inhaled twice daily. Continued on prednisone taper by 2.5 mg every 4 weeks at last appointment. Previously on 10 mg daily at time of taper initiation/continuation. Previously participating in cardiac rehabilitation and also exercising at the [facility]. Referred to [facility] for transplant evaluation.  Chronic hypoxic respiratory failure: Prescribed oxygen at 3 L/m with exertion at last appointment.  Pulmonary hypertension: Confirmed with right heart catheterization October 2017. Following with cardiology. Currently on pulmonary arterial vasodilator therapy managed by cardiology.  GERD: Prescribed Nexium and Zantac.   Review of Systems   Allergies  Allergen Reactions  . No Known Allergies     Current Outpatient Prescriptions on File Prior to Visit  Medication Sig Dispense Refill  . ambrisentan (LETAIRIS) 10 MG tablet Take 1 tablet (10 mg total) by mouth daily. 30 tablet 11  . aspirin 81 MG tablet Take 1 tablet (81 mg total) by mouth daily. 30 tablet 0  . atorvastatin (LIPITOR) 40 MG tablet Take 1 tablet (40 mg total) by mouth daily. 30 tablet 3  . beclomethasone (QVAR) 80 MCG/ACT inhaler Inhale 2 puffs into the lungs 2 (two) times daily. 1 Inhaler 6  . clopidogrel (PLAVIX) 75 MG tablet Take 1 tablet (75 mg total) by mouth daily. 30 tablet 11  . NEXIUM 40 MG capsule TAKE 1 CAPSULE BY MOUTH DAILY AT NOON 30 capsule 3  . predniSONE (DELTASONE) 2.5 MG tablet Take 1 tablet (2.5 mg total) by mouth daily with breakfast. 120 tablet 0  . ranitidine (ZANTAC) 150 MG tablet TAKE 1 TABLET (150 MG TOTAL) BY MOUTH AT BEDTIME. 30 tablet 3  . tadalafil, PAH, (ADCIRCA) 20 MG tablet Take 2 tablets (40 mg  total) by mouth daily. 180 tablet 3   No current facility-administered medications on file prior to visit.     Past Medical History:  Diagnosis Date  . Asthma    as a child  . Cough   . Dyspnea   . GERD (gastroesophageal reflux disease)   . Migraines   . Oxygen dependent   . Pulmonary hypertension ([place])   . Sarcoidosis     Past Surgical History:  Procedure Laterality Date  . CARDIAC CATHETERIZATION N/A 12/12/2015   Procedure: Right Heart Cath;  Surgeon: Larey Dresser, MD;  Location: [facility];  Service: Cardiovascular;  Laterality: N/A;  . MULTIPLE TOOTH EXTRACTIONS  01/13/16   all teeth will be removed  . tuballigation    . VIDEO BRONCHOSCOPY WITH ENDOBRONCHIAL ULTRASOUND N/A 01/16/2016   Procedure: VIDEO BRONCHOSCOPY WITH ENDOBRONCHIAL ULTRASOUND;  Surgeon: Melrose Nakayama, MD;  Location: [facility] OR;  Service: Thoracic;  Laterality: N/A;    Family History  Problem Relation Age of Onset  . Hypertension Mother   . Hypertension Father   . Sarcoidosis Maternal Aunt   . Rheumatologic disease Neg Hx     Social History   Social History  . Marital status: Married    Spouse name: N/A  . Number of children: N/A  . Years of education: N/A   Social History Main Topics  . Smoking status: Former Smoker    Packs/day: 0.25    Years: 10.00    Types: Cigarettes    Quit date:  01/11/2011  . Smokeless tobacco: Never Used  . Alcohol use No  . Drug use: No  . Sexual activity: Not on file   Other Topics Concern  . Not on file   Social History Narrative   Patient denies any bird or mold exposure. Currently works with exposure to inhaled perfumes and chemicals. 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 Previous exposure to inhaled dust while working in [employer]. Denies any IV drug use or previous incarceration. No known tuberculosis exposure. She has never lived nor volunteered at a homeless shelter.        Objective:   Physical Exam There were no vitals taken for this visit.  General:  Awake. Alert. No acute distress. Sitting watching TV. Family at bedside.  Integument:  Warm & dry. No rash on exposed skin. No bruising. Extremities:  No cyanosis or clubbing.  HEENT:  Moist mucus membranes. No oral ulcers. No scleral injection or icterus. Endotracheal tube in place. PERRL. Cardiovascular:  Regular rate. No edema. No appreciable JVD.  Pulmonary:  Good aeration & clear to auscultation bilaterally. Symmetric chest wall expansion. No accessory muscle use. Abdomen: Soft. Normal bowel sounds. Nondistended. Grossly nontender. Musculoskeletal:  Normal bulk and tone. Hand grip strength 5/5 bilaterally. No joint deformity or effusion appreciated.  *** General:  Awake. Alert. No acute distress. African-American female. Integument:  Warm & dry. No rash on exposed skin. No bruising on exposed skin. Extremities:  No cyanosis.  HEENT:  Moist mucus membranes. Minimal nasal turbinate swelling. No oral ulcers. Cardiovascular:  Regular rate. No edema. Normal S1 & S2.  Pulmonary:  Good aeration & clear to auscultation bilaterally. Symmetric chest wall expansion. No accessory muscle [place] on room air e. Abdomen: Soft. Normal bowel sounds. Nondistended.  Musculoskeletal:  Normal bulk and tone. No joint deformity or effusion appreciated.  PFT 11/28/16: FVC 1.69 L (68%) FEV1 1.46 L (74%) FEV1/FVC 0.7 FEF 25-75 1.85 L (86%)                                                                                                                      DLCO corrected 27% 08/23/16: FVC 1.70 L (69%) FEV1 1.49 L (75%) FEV1/FVC 0.87 FEF 25-75 1.99 L (92%)  DLCO corrected 31% 12/27/15: FVC 1.53 L (61%) FEV1 1.21 L (60%) FEV1/FVC 0.79 FEF 25-75 1.03 L (46%) negative bronchodilator response TLC 2.62 L (58%) RV 70% ERV  109% DLCO unreliable  6MWT 09/26/16:  Walked 333 meters / Baseline Sat 93% on RA / Nadir Sat 86% on 2 L/m @ 48 meters (required 3 L/m with exertion to maintain) 05/03/16:  Walked 320 meters / Baseline Sat 99% on  3 L/m / Nadir Sat 68% on  3 L/m @ end of test (required 4 L/m with exertion to maintain with exertion but desaturated with coughing) 12/28/15:  Walked 264 meters / Baseline Sat 100% on 2 L/m / Nadir Sat 88% on 2 L/m (required 3 L/m to maintain saturation with exertion)  IMAGING CTA CHEST 11/01/15 (previously reviewed by me):No pulmonary emboli noted. No pleural effusion or thickening. Enlarged main pulmonary artery. No pericardial effusion appreciated. Bulky mediastinal and hilar lymphadenopathy. Bilateral upper lung cystic changes as well as subpleural consolidation within right lower lung and bilateral upper lobes. Bilateral patchy groundglass also noted.  CARDIAC RHC (12/12/15): RA Mean - 3 RV - 55/1 PA - 59/22 (36) PCWP - 5 PA Sat - 75% Aortic Sat - 100% CO - 4.2 CI - 2.92 PVR - 7.4 WU  48 HOUR HOLTER MONITOR (12/05/15):  Sinus rhythm with PVCs & PACs. HR 70-160.  TTE (11/02/15):LV normal in size with EF 55-60%.No wall motion abnormalities. Grade 1 diastolic dysfunction. LA &RA normal in size. RV normal in size and function. Pulmonary artery systolic pressure 60 mmHg. Diastolic flattening and systolic flattening of the intraventricular septum. No aortic stenosis or regurgitation. Aortic root normal in size. Trivial mitral regurgitation without stenosis. No pulmonic stenosis. Mild tricuspid regurgitation. Trivial pericardial effusion.  PATHOLOGY BAL RUL (01/16/16):  No malignancy w/ granulomatous inflammation EBUS FNA Level 7 (01/16/16):  No malignancy & vague granulomatous inflammation Brushing RUL (01/16/16):  Vague granulomatous inflammation Transbronchial Biopsies RUL & Trachea (01/16/16):  Benign respiratory epthelium  MICROBIOLOGY BAL (01/16/16):  Bacteria negative / AFB  negative / Fungus negative Quantiferon-TB 02/28/16:  Negative Skin PPD 03/02/16:  Negative (0 induration) Influenza DFA (05/03/16):  Negative   LABS 07/23/16 CBC: 11.4/10.4/32.7/540 BMP: 138/4.6/104/27/10/0.74/103/9.2 LFT: 3.9/7.5/0.4/63/19/14 ACE:  72  12/09/15 LDH:  370 CRP:  3.4 ESR:  112 ANA:  Negative ACE:  136 Anti-CCP:  <16 RF:  <14 Smith Ab:  <1.0 DS DNA Ab:  1 SSA:  <1.0 SSB:  <1.0 3/17 ACE:  120 ANA:  1:80 (speckled)    Assessment & Plan:  51 y.o.   female with pulmonary hypertension, sarcoidosis, and chronic hypoxic respiratory failure. Patient has not yet been contacted by cardiac rehabilitation to establish care which is somewhat disappointing. Overall her pulmonary hypertension seems to be better controlled but her regimen is being adjusted given results of her previous pressures during her evaluation by [facility]. Patient's spirometry has significant improved since initial testing and her walk test is steadily improving as well. It's unclear to me whether or not she has any arterial involvement of her sarcoidosis directly and this warrants close monitoring as I taper her prednisone further given the potential for worsening of her pulmonary hypertension. I instructed the patient to contact my office if she had any new breathing problems or questions before her next appointment.  1. Sarcoidosis: 2. Chronic hypoxic respiratory failure: 3. Pulmonary hypertension: 4. GERD:  5. Health maintenance:  Status post Pneumovax August 2017. 6. Follow-up: Return to clinic in  7. Sarcoidosis: Continuing taper of prednisone  by 2.5 mg every 4 weeks. Continuing Qvar 160 g inhaled twice daily. Checking serum CMP, CBC, and ACE today. Repeat spirometry with DLCO at next appointment. 8. Chronic hypoxic respiratory failure: Continuing on oxygen at 3 L/m with exertion. Patient instructed to maintain oxygen saturation greater than 92%. Repeat 6 minute walk test with oxygen titration at next  appointment. 9. Pulmonary hypertension: Followed by cardiology. Continuing on treatment as planned. 10. GERD: Controlled with Nexium and Zantac. No new medications. 11. Health maintenance: Planning for Prevnar vaccine August 2018.   12. Follow-up: Return to clinic in 8 weeks or sooner if needed.  Sonia Baller Ashok Cordia, M.D. [facility] Pager:  [phone] After 3pm or if no response, call [phone] 1:45 PM 11/29/16

## 2016-11-29 NOTE — Progress Notes (Signed)
Subjective:    Patient ID: Erin Zavala, female    DOB: [birth year], 51 y.o.   MRN: [id number]  C.C.:  Follow-up for Sarcoidosis, Chronic Hypoxic Respiratory Failure, Moderate Pulmonary Hypertension, & GERD.  HPI  Sarcoidosis: Patient has lung and eye involvement. Biopsy confirmed 01/16/16. Prescribed Qvar 160 g inhaled twice daily. Continued on prednisone taper by 2.5 mg every 4 weeks at last appointment. Previously on 10 mg daily at time of taper initiation/continuation. Previously participating in cardiac rehabilitation and also exercising at the [facility]. Referred to [facility] for transplant evaluation. She reports a mild, intermittent cough starting a week ago. She reports she is producing a clear mucus. She denies any wheezing. She feels this cough is due to her post-nasal drainage. She feels like her breathing is doing ok. Her last dose of Prednisone was last week. She reports her blurry vision comes and goes. Denies any eye swelling, pain, or erythema. She is continuing to use her eye drop. She does back to [facility] in early October.   Chronic hypoxic respiratory failure: Prescribed oxygen at 3 L/m with exertion at last appointment.  Patient's walk test today shows improved distance before desaturation and continued stability in oxygen requirement with ambulation.  Pulmonary hypertension: Confirmed with right heart catheterization October 2017. Following with cardiology. Currently on pulmonary arterial vasodilator therapy managed by cardiology. She had severe problems with [place]. She has continued on [place]. She is continuing with Cardiac Rehab 3 days weekly. She is walking regularly on her days off.   GERD: Prescribed Nexium and Zantac. She reports her reflux is not as bad as it was. She has continued to regularly take Nexium. However, she is only taking her Zantac intermittently. She reports reflux if she tries to wean down the Nexium. Denies any morning brash water taste.  Review of  Systems No chest pain, tightness, or pressure. No rashes or abnormal bruising. No focal weakness, numbness or tingling. She has had more sinus congestion & drainage lately. She feels great.  Allergies  Allergen Reactions  . No Known Allergies     Current Outpatient Prescriptions on File Prior to Visit  Medication Sig Dispense Refill  . ambrisentan (LETAIRIS) 10 MG tablet Take 1 tablet (10 mg total) by mouth daily. 30 tablet 11  . aspirin 81 MG tablet Take 1 tablet (81 mg total) by mouth daily. 30 tablet 0  . atorvastatin (LIPITOR) 40 MG tablet Take 1 tablet (40 mg total) by mouth daily. 30 tablet 3  . beclomethasone (QVAR) 80 MCG/ACT inhaler Inhale 2 puffs into the lungs 2 (two) times daily. 1 Inhaler 6  . clopidogrel (PLAVIX) 75 MG tablet Take 1 tablet (75 mg total) by mouth daily. 30 tablet 11  . NEXIUM 40 MG capsule TAKE 1 CAPSULE BY MOUTH DAILY AT NOON 30 capsule 3  . predniSONE (DELTASONE) 2.5 MG tablet Take 1 tablet (2.5 mg total) by mouth daily with breakfast. 120 tablet 0  . ranitidine (ZANTAC) 150 MG tablet TAKE 1 TABLET (150 MG TOTAL) BY MOUTH AT BEDTIME. 30 tablet 3  . tadalafil, PAH, (ADCIRCA) 20 MG tablet Take 2 tablets (40 mg total) by mouth daily. 180 tablet 3   No current facility-administered medications on file prior to visit.     Past Medical History:  Diagnosis Date  . Asthma    as a child  . Cough   . Dyspnea   . GERD (gastroesophageal reflux disease)   . Migraines   . Oxygen dependent   .  Pulmonary hypertension ([place])   . Sarcoidosis     Past Surgical History:  Procedure Laterality Date  . CARDIAC CATHETERIZATION N/A 12/12/2015   Procedure: Right Heart Cath;  Surgeon: Larey Dresser, MD;  Location: [facility];  Service: Cardiovascular;  Laterality: N/A;  . MULTIPLE TOOTH EXTRACTIONS  01/13/16   all teeth will be removed  . tuballigation    . VIDEO BRONCHOSCOPY WITH ENDOBRONCHIAL ULTRASOUND N/A 01/16/2016   Procedure: VIDEO BRONCHOSCOPY WITH  ENDOBRONCHIAL ULTRASOUND;  Surgeon: Melrose Nakayama, MD;  Location: [facility] OR;  Service: Thoracic;  Laterality: N/A;    Family History  Problem Relation Age of Onset  . Hypertension Mother   . Hypertension Father   . Sarcoidosis Maternal Aunt   . Rheumatologic disease Neg Hx     Social History   Social History  . Marital status: Married    Spouse name: N/A  . Number of children: N/A  . Years of education: N/A   Social History Main Topics  . Smoking status: Former Smoker    Packs/day: 0.25    Years: 10.00    Types: Cigarettes    Quit date: 01/11/2011  . Smokeless tobacco: Never Used  . Alcohol use No  . Drug use: No  . Sexual activity: Not Asked   Other Topics Concern  . None   Social History Narrative   Patient denies any bird or mold exposure. Currently works with exposure to inhaled perfumes and chemicals. 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 Previous exposure to inhaled dust while working in [employer]. Denies any IV drug use or previous incarceration. No known tuberculosis exposure. She has never lived nor volunteered at a homeless shelter.      Objective:   Physical Exam BP 130/70 (BP Location: Right Arm, Cuff Size: Normal)   Pulse 74   Ht 5' (1.524 m)   Wt 137 lb 4 oz (62.3 kg)   SpO2 93%   BMI 26.80 kg/m   General:  Awake. Comfortable. No distress. Integument:  Warm & dry. No rash on exposed skin. No bruising on exposed skin. Extremities:  No cyanosis.  Lymphatics: No appreciated cervical or supraclavicular lymphadenopathy. HEENT:  Moist mucus membranes. Mild bilateral nasal turbinate swelling. No oral ulcers. No scleral injection. Cardiovascular:  Regular rate. No edema. Regular rhythm.  Pulmonary:  Clear bilaterally to auscultation. Normal work of breathing on supplemental oxygen. Good aeration. Abdomen: Soft. Normal bowel sounds. Nondistended.  Musculoskeletal:  Normal bulk and tone.  No joint deformity or effusion appreciated.  PFT 11/28/16: FVC 1.69 L (68%) FEV1 1.46 L (74%) FEV1/FVC 0.7 FEF 25-75 1.85 L (86%)                                                                                                                             DLCO corrected 27% 08/23/16: FVC 1.70 L (69%) FEV1 1.49 L (75%) FEV1/FVC 0.87 FEF 25-75 1.99  L (92%)                                                                                                                            DLCO corrected 31% 12/27/15: FVC 1.53 L (61%) FEV1 1.21 L (60%) FEV1/FVC 0.79 FEF 25-75 1.03 L (46%) negative bronchodilator response TLC 2.62 L (58%) RV 70% ERV 109% DLCO unreliable  6MWT 11/29/16:  Walked 312 meters / Baseline Sat 98% on 2 L/m / Nadir Sat 87% on 2 L/m at 273 meters (required 3 L/m with exertion to maintain) 09/26/16:  Walked 333 meters / Baseline Sat 93% on RA / Nadir Sat 86% on 2 L/m @ 48 meters (required 3 L/m with exertion to maintain) 05/03/16:  Walked 320 meters / Baseline Sat 99% on  3 L/m / Nadir Sat 68% on  3 L/m @ end of test (required 4 L/m with exertion to maintain with exertion but desaturated with coughing) 12/28/15:  Walked 264 meters / Baseline Sat 100% on 2 L/m / Nadir Sat 88% on 2 L/m (required 3 L/m to maintain saturation with exertion)  IMAGING CTA CHEST 11/01/15 (previously reviewed by me):No pulmonary emboli noted. No pleural effusion or thickening. Enlarged main pulmonary artery. No pericardial effusion appreciated. Bulky mediastinal and hilar lymphadenopathy. Bilateral upper lung cystic changes as well as subpleural consolidation within right lower lung and bilateral upper lobes. Bilateral patchy groundglass also noted.  CARDIAC RHC (12/12/15): RA Mean - 3 RV - 55/1 PA - 59/22 (36) PCWP - 5 PA Sat - 75% Aortic Sat - 100% CO - 4.2 CI - 2.92 PVR - 7.4 WU  48 HOUR HOLTER MONITOR (12/05/15):  Sinus rhythm with PVCs & PACs. HR 70-160.  TTE (11/02/15):LV normal in size with EF  55-60%.No wall motion abnormalities. Grade 1 diastolic dysfunction. LA &RA normal in size. RV normal in size and function. Pulmonary artery systolic pressure 60 mmHg. Diastolic flattening and systolic flattening of the intraventricular septum. No aortic stenosis or regurgitation. Aortic root normal in size. Trivial mitral regurgitation without stenosis. No pulmonic stenosis. Mild tricuspid regurgitation. Trivial pericardial effusion.  PATHOLOGY BAL RUL (01/16/16):  No malignancy w/ granulomatous inflammation EBUS FNA Level 7 (01/16/16):  No malignancy & vague granulomatous inflammation Brushing RUL (01/16/16):  Vague granulomatous inflammation Transbronchial Biopsies RUL & Trachea (01/16/16):  Benign respiratory epthelium  MICROBIOLOGY BAL (01/16/16):  Bacteria negative / AFB negative / Fungus negative Quantiferon-TB 02/28/16:  Negative Skin PPD 03/02/16:  Negative (0 induration) Influenza DFA (05/03/16):  Negative   LABS 07/23/16 CBC: 11.4/10.4/32.7/540 BMP: 138/4.6/104/27/10/0.74/103/9.2 LFT: 3.9/7.5/0.4/63/19/14 ACE:  72  12/09/15 LDH:  370 CRP:  3.4 ESR:  112 ANA:  Negative ACE:  136 Anti-CCP:  <16 RF:  <14 Smith Ab:  <1.0 DS DNA Ab:  1 SSA:  <1.0 SSB:  <1.0 3/17 ACE:  120 ANA:  1:80 (speckled)    Assessment & Plan:  51 y.o. female with pulmonary and ocular sarcoidosis. Symptomatically the patient seems to  be well-controlled with her inhaled steroid therapy and eyedrops. Her spirometry remains stable and her oxygenation during her 6 minute walk test today shows improvement compared with her previous walked in July. She seems to have tolerated her steroid taper/discontinuation well without signs of clinical worsening. Overall she appears euvolemic with regards to her pulmonary hypertension. She does have follow-up upcoming at [facility] for continued monitoring. The patient wishes to delay lung transplantation as long as possible. I believe this is a very realistic and appropriate  mindset. We did also discuss her reflux and I urged the patient to try transitioning to Zantac as a solitary treatment agent given the potential negative side effects of long-term proton pump inhibitor use. I instructed the patient to contact our office if she develops any new breathing problems or have questions before her next appointment.  1. Sarcoidosis: Continuing Qvar 160 g inhaled twice daily and eyedrops prescribed by ophthalmologist. Repeat spirometry with DLCO at next appointment. Checking serum ESR, CRP, CBC, CMP, and ACE level today. 2. Chronic hypoxic respiratory failure:  Continuing on oxygen as previously prescribed at 3 L/m with exertion. Repeat 6 minute walk test with oxygen titration at next appointment starting from 2 L/m 3. Pulmonary hypertension:  Continuing on pulmonary arterial vasodilator regimen by cardiology. No changes. 4. GERD: Patient continuing on Zantac. No changes. 5. Health maintenance:  Status post Pneumovax August 2017. Administering Prevnar 13 vaccine today. Recommended Influenza Vaccine next month. 6. Follow-up: Return to clinic in 6 months or sooner if needed.  Sonia Baller Ashok Cordia, M.D. [facility] Pager:  [phone] After 3pm or if no response, call [phone] 4:24 PM 11/29/16

## 2016-11-30 ENCOUNTER — Encounter (HOSPITAL_COMMUNITY)
Admission: RE | Admit: 2016-11-30 | Discharge: 2016-11-30 | Disposition: A | Discharge status: Home / Self Care | Payer: Medicaid Other | Source: Ambulatory Visit | Attending: Cardiology | Admitting: Cardiology

## 2016-11-30 DIAGNOSIS — Z955 Presence of coronary angioplasty implant and graft: Secondary | ICD-10-CM

## 2016-11-30 LAB — COMPREHENSIVE METABOLIC PANEL
ALBUMIN: 4 g/dL (ref 3.5–5.2)
ALK PHOS: 71 U/L (ref 39–117)
ALT: 15 U/L (ref 0–35)
AST: 20 U/L (ref 0–37)
BILIRUBIN TOTAL: 0.3 mg/dL (ref 0.2–1.2)
BUN: 10 mg/dL (ref 6–23)
CALCIUM: 9.6 mg/dL (ref 8.4–10.5)
CO2: 24 mEq/L (ref 19–32)
Chloride: 106 mEq/L (ref 96–112)
Creatinine, Ser: 0.76 mg/dL (ref 0.40–1.20)
GFR: 103.3 mL/min (ref >60.00)
Glucose, Bld: 99 mg/dL (ref 70–99)
Potassium: 4.3 mEq/L (ref 3.5–5.1)
Sodium: 138 mEq/L (ref 135–145)
TOTAL PROTEIN: 7.7 g/dL (ref 6.0–8.3)

## 2016-11-30 LAB — ANGIOTENSIN CONVERTING ENZYME: Angiotensin-Converting Enzyme: 105 U/L — ABNORMAL HIGH (ref 9–67)

## 2016-11-30 LAB — C-REACTIVE PROTEIN: CRP: 0.6 mg/dL (ref 0.5–20.0)

## 2016-12-03 ENCOUNTER — Encounter (HOSPITAL_COMMUNITY)
Admission: RE | Admit: 2016-12-03 | Discharge: 2016-12-03 | Disposition: A | Discharge status: Home / Self Care | Payer: Medicaid Other | Source: Ambulatory Visit | Attending: Cardiology | Admitting: Cardiology

## 2016-12-03 ENCOUNTER — Telehealth: Payer: Self-pay

## 2016-12-03 DIAGNOSIS — D649 Anemia, unspecified: Secondary | ICD-10-CM

## 2016-12-03 DIAGNOSIS — Z955 Presence of coronary angioplasty implant and graft: Secondary | ICD-10-CM

## 2016-12-03 NOTE — Telephone Encounter (Signed)
Orders are being placed for patient to come in on 10/3 for repeat blood work.

## 2016-12-04 ENCOUNTER — Encounter (HOSPITAL_COMMUNITY): Payer: Medicaid Other

## 2016-12-05 ENCOUNTER — Other Ambulatory Visit (INDEPENDENT_AMBULATORY_CARE_PROVIDER_SITE_OTHER): Payer: Medicaid Other

## 2016-12-05 ENCOUNTER — Encounter (HOSPITAL_COMMUNITY): Payer: Medicaid Other

## 2016-12-05 DIAGNOSIS — D649 Anemia, unspecified: Secondary | ICD-10-CM

## 2016-12-05 LAB — CBC
HEMATOCRIT: 28.4 % — AB (ref 36.0–46.0)
Hemoglobin: 8.6 g/dL — ABNORMAL LOW (ref 12.0–15.0)
MCHC: 30.2 g/dL (ref 30.0–36.0)
MCV: 71.1 fl — AB (ref 78.0–100.0)
Platelets: 419 10*3/uL — ABNORMAL HIGH (ref 150.0–400.0)
RBC: 4 Mil/uL (ref 3.87–5.11)
RDW: 19.7 % — ABNORMAL HIGH (ref 11.5–15.5)
WBC: 7.7 10*3/uL (ref 4.0–10.5)

## 2016-12-05 LAB — APTT: aPTT: 26.5 s (ref 23.4–32.7)

## 2016-12-05 LAB — PROTIME-INR
INR: 1.1 ratio — AB (ref 0.8–1.0)
PROTHROMBIN TIME: 12.4 s (ref 9.6–13.1)

## 2016-12-06 ENCOUNTER — Encounter (HOSPITAL_COMMUNITY): Payer: Medicaid Other

## 2016-12-06 ENCOUNTER — Other Ambulatory Visit (INDEPENDENT_AMBULATORY_CARE_PROVIDER_SITE_OTHER): Payer: Medicaid Other

## 2016-12-06 ENCOUNTER — Telehealth: Payer: Self-pay | Admitting: Pulmonary Disease

## 2016-12-06 DIAGNOSIS — D649 Anemia, unspecified: Secondary | ICD-10-CM | POA: Diagnosis not present

## 2016-12-06 LAB — IBC PANEL
Iron: 8 ug/dL — ABNORMAL LOW (ref 42–145)
SATURATION RATIOS: 2.1 % — AB (ref 20.0–50.0)
TRANSFERRIN: 277 mg/dL (ref 212.0–360.0)

## 2016-12-06 LAB — IRON: IRON: 8 ug/dL — AB (ref 42–145)

## 2016-12-06 LAB — FERRITIN: FERRITIN: 9 ng/mL — AB (ref 10.0–291.0)

## 2016-12-06 NOTE — Telephone Encounter (Signed)
Per Dr. Ashok Cordia patient needs to come in for an iron panel. I have order these and will call the patient to come in.

## 2016-12-07 ENCOUNTER — Encounter (HOSPITAL_COMMUNITY): Payer: Medicaid Other

## 2016-12-10 ENCOUNTER — Encounter (HOSPITAL_COMMUNITY)
Admission: RE | Admit: 2016-12-10 | Discharge: 2016-12-10 | Disposition: A | Discharge status: Home / Self Care | Payer: Medicaid Other | Source: Ambulatory Visit | Attending: Cardiology | Admitting: Cardiology

## 2016-12-10 DIAGNOSIS — Z955 Presence of coronary angioplasty implant and graft: Secondary | ICD-10-CM | POA: Diagnosis present

## 2016-12-11 ENCOUNTER — Other Ambulatory Visit: Payer: Self-pay | Admitting: Pulmonary Disease

## 2016-12-11 ENCOUNTER — Encounter (HOSPITAL_COMMUNITY): Payer: Medicaid Other

## 2016-12-11 MED ORDER — FERROUS SULFATE 325 (65 FE) MG PO TABS: 325.0000 mg | ORAL_TABLET | Freq: Two times a day (BID) | ORAL | 3 refills | Status: DC

## 2016-12-12 ENCOUNTER — Encounter (HOSPITAL_COMMUNITY)
Admission: RE | Admit: 2016-12-12 | Discharge: 2016-12-12 | Disposition: A | Discharge status: Home / Self Care | Payer: Medicaid Other | Source: Ambulatory Visit | Attending: Cardiology | Admitting: Cardiology

## 2016-12-12 DIAGNOSIS — Z955 Presence of coronary angioplasty implant and graft: Secondary | ICD-10-CM | POA: Diagnosis not present

## 2016-12-13 ENCOUNTER — Encounter (HOSPITAL_COMMUNITY): Payer: Medicaid Other

## 2016-12-14 ENCOUNTER — Encounter (HOSPITAL_COMMUNITY): Payer: Medicaid Other

## 2016-12-17 ENCOUNTER — Encounter (HOSPITAL_COMMUNITY): Payer: Medicaid Other

## 2016-12-17 ENCOUNTER — Ambulatory Visit (HOSPITAL_COMMUNITY)
Admission: RE | Admit: 2016-12-17 | Discharge: 2016-12-17 | Disposition: A | Discharge status: Home / Self Care | Payer: Medicaid Other | Source: Ambulatory Visit | Attending: Internal Medicine | Admitting: Internal Medicine

## 2016-12-17 ENCOUNTER — Other Ambulatory Visit: Payer: Self-pay | Admitting: Pulmonary Disease

## 2016-12-17 DIAGNOSIS — E7800 Pure hypercholesterolemia, unspecified: Secondary | ICD-10-CM | POA: Insufficient documentation

## 2016-12-17 LAB — HEPATIC FUNCTION PANEL
ALBUMIN: 3.4 g/dL — AB (ref 3.5–5.0)
ALK PHOS: 74 U/L (ref 38–126)
ALT: 18 U/L (ref 14–54)
AST: 32 U/L (ref 15–41)
Bilirubin, Direct: <0.1 mg/dL — ABNORMAL LOW (ref 0.1–0.5)
TOTAL PROTEIN: 7.1 g/dL (ref 6.5–8.1)
Total Bilirubin: 0.5 mg/dL (ref 0.3–1.2)

## 2016-12-17 LAB — LIPID PANEL
CHOL/HDL RATIO: 2.9 ratio
Cholesterol: 117 mg/dL (ref 0–200)
HDL: 40 mg/dL — AB (ref >40)
LDL CALC: 62 mg/dL (ref 0–99)
Triglycerides: 73 mg/dL (ref <150)
VLDL: 15 mg/dL (ref 0–40)

## 2016-12-18 ENCOUNTER — Encounter (HOSPITAL_COMMUNITY): Payer: Self-pay

## 2016-12-18 ENCOUNTER — Encounter (HOSPITAL_COMMUNITY): Payer: Medicaid Other

## 2016-12-18 NOTE — Progress Notes (Signed)
Cardiac Individual Treatment Plan  Patient Details  Name: Erin Zavala MRN: [id number] Date of Birth: 24-Jun-1965 Referring Provider:     [facility] from 10/23/2016 in [place]  Referring Provider  Loralie Champagne, MD.      Initial Encounter Date:    CARDIAC REHAB PHASE II ORIENTATION from 10/23/2016 in [place]  Date  10/23/16  Referring Provider  Loralie Champagne, MD.      Visit Diagnosis: 07/16/16 Status post coronary artery stent placement  Patient's Home Medications on Admission:  Current Outpatient Prescriptions:  .  ambrisentan (LETAIRIS) 10 MG tablet, Take 1 tablet (10 mg total) by mouth daily., Disp: 30 tablet, Rfl: 11 .  aspirin 81 MG tablet, Take 1 tablet (81 mg total) by mouth daily., Disp: 30 tablet, Rfl: 0 .  atorvastatin (LIPITOR) 40 MG tablet, Take 1 tablet (40 mg total) by mouth daily., Disp: 30 tablet, Rfl: 3 .  beclomethasone (QVAR) 80 MCG/ACT inhaler, Inhale 2 puffs into the lungs 2 (two) times daily., Disp: 1 Inhaler, Rfl: 6 .  clopidogrel (PLAVIX) 75 MG tablet, Take 1 tablet (75 mg total) by mouth daily., Disp: 30 tablet, Rfl: 11 .  esomeprazole (NEXIUM) 40 MG capsule, TAKE 1 CAPSULE BY MOUTH DAILY AT NOON, Disp: 30 capsule, Rfl: 3 .  ferrous sulfate 325 (65 FE) MG tablet, Take 1 tablet (325 mg total) by mouth 2 (two) times daily with a meal., Disp: 60 tablet, Rfl: 3 .  predniSONE (DELTASONE) 2.5 MG tablet, Take 1 tablet (2.5 mg total) by mouth daily with breakfast., Disp: 120 tablet, Rfl: 0 .  predniSONE (DELTASONE) 2.5 MG tablet, TAKE 3 TABLETS DAILY FOR FOUR WEEKS THEN 2 TABLETS DAILY UNTIL NEXT OFFICE VISIT., Disp: 150 tablet, Rfl: 0 .  ranitidine (ZANTAC) 150 MG tablet, TAKE 1 TABLET (150 MG TOTAL) BY MOUTH AT BEDTIME., Disp: 30 tablet, Rfl: 3 .  tadalafil, PAH, (ADCIRCA) 20 MG tablet, Take 2 tablets (40 mg total) by mouth daily., Disp: 180 tablet, Rfl: 3  Past Medical  History: Past Medical History:  Diagnosis Date  . Asthma    as a child  . Cough   . Dyspnea   . GERD (gastroesophageal reflux disease)   . Migraines   . Oxygen dependent   . Pulmonary hypertension ([facility])   . Sarcoidosis     Tobacco Use: History  Smoking Status  . Former Smoker  . Packs/day: 0.25  . Years: 10.00  . Types: Cigarettes  . Quit date: 01/11/2011  Smokeless Tobacco  . Never Used    Labs: Recent Review Flowsheet Data    Labs for ITP Cardiac and Pulmonary Rehab Latest Ref Rng & Units 11/02/2015 12/12/2015 12/12/2015 10/15/2016 12/17/2016   Cholestrol 0 - 200 mg/dL 218(H) - - 170 117   LDLCALC 0 - 99 mg/dL 164(H) - - 100(H) 62   HDL >40 mg/dL 40(L) - - 50 40(L)   Trlycerides <150 mg/dL 68 - - 101 73   Hemoglobin A1c 4.8 - 5.6 % 6.2(H) - - - -   PHART 7.350 - 7.450 7.412 - - - -   PCO2ART 35.0 - 45.0 mmHg 34.7(L) - - - -   HCO3 20.0 - 28.0 mmol/L 22.1 25.5 26.1 - -   TCO2 0 - 100 mmol/L _0 - -   ACIDBASEDEF 0.0 - 2.0 mmol/L 2.0 1.0 - - -   O2SAT % 99.0 74.0 75.0 - -  Capillary Blood Glucose: No results found for: GLUCAP   Exercise Target Goals:    Exercise Program Goal: Individual exercise prescription set with THRR, safety & activity barriers. Participant demonstrates ability to understand and report RPE using BORG scale, to self-measure pulse accurately, and to acknowledge the importance of the exercise prescription.  Exercise Prescription Goal: Starting with aerobic activity 30 plus minutes a day, 3 days per week for initial exercise prescription. Provide home exercise prescription and guidelines that participant acknowledges understanding prior to discharge.  Activity Barriers & Risk Stratification:     Activity Barriers & Cardiac Risk Stratification - 10/23/16 0911      Activity Barriers & Cardiac Risk Stratification   Activity Barriers Other (comment)   Comments some achiness in leg region due to meds   Cardiac Risk Stratification High       6 Minute Walk:     6 Minute Walk    Row Name 10/23/16 1158         6 Minute Walk   Phase Initial     Distance 1342 feet     Walk Time 6 minutes     # of Rest Breaks 0     MPH 2.54     METS 4.59     RPE 11     Perceived Dyspnea  1     VO2 Peak 16.06     Symptoms Yes (comment)     Comments Shortness of breath     Resting HR 87 bpm     Resting BP 116/68     Max Ex. HR 144 bpm     Max Ex. BP 140/62     2 Minute Post BP 118/62       Interval Oxygen   Interval Oxygen? Yes     Baseline Oxygen Saturation % 98 %     Resting Liters of Oxygen 3 L     3 Minute Oxygen Saturation % 88 %     3 Minute Liters of Oxygen 4 L     6 Minute Oxygen Saturation % 98 %     6 Minute Liters of Oxygen 4 L     2 Minute Post Oxygen Saturation % 99 %     2 Minute Post Liters of Oxygen 3 L        Oxygen Initial Assessment:   Oxygen Re-Evaluation:   Oxygen Discharge (Final Oxygen Re-Evaluation):   Initial Exercise Prescription:     Initial Exercise Prescription - 10/23/16 1200      Date of Initial Exercise RX and Referring Provider   Date 10/23/16   Referring Provider Loralie Champagne, MD.     Oxygen   Oxygen Continuous   Liters 4L     Treadmill   MPH 2.2   Grade 1   Minutes 10   METs 2.81     Bike   Level 1   Minutes 10   METs 4.02     NuStep   Level 2   SPM 85   Minutes 10   METs 2.5     Prescription Details   Frequency (times per week) 3   Duration Progress to 30 minutes of continuous aerobic without signs/symptoms of physical distress     Intensity   THRR 40-80% of Max Heartrate 68-136   Ratings of Perceived Exertion 11-13   Perceived Dyspnea 0-4     Progression   Progression Continue to progress workloads to maintain intensity without signs/symptoms of physical distress.  Resistance Training   Training Prescription Yes   Weight 2lbs   Reps 10-15      Perform Capillary Blood Glucose checks as needed.  Exercise Prescription Changes:      Exercise Prescription Changes    Row Name 10/29/16 1543 11/09/16 1541 11/20/16 1500 11/30/16 1337 12/11/16 1300     Response to Exercise   Blood Pressure (Admit) 104/68 112/60 104/68 100/64 104/68   Blood Pressure (Exercise) 136/80 132/60 118/70 130/60 104/64   Blood Pressure (Exit) 118/82 108/62 120/66 108/62 104/64   Heart Rate (Admit) 82 bpm 96 bpm 91 bpm 90 bpm 100 bpm   Heart Rate (Exercise) 150 bpm 134 bpm 136 bpm 136 bpm 125 bpm   Heart Rate (Exit) 80 bpm 88 bpm 89 bpm 78 bpm 89 bpm   Oxygen Saturation (Admit)  -  - 99 % 97 % 100 %   Oxygen Saturation (Exercise)  -  - 92 % 90 % 92 %   Oxygen Saturation (Exit)  -  - 100 % 99 % 99 %   Rating of Perceived Exertion (Exercise) _0 Perceived Dyspnea (Exercise)  -  - 1 0 0   Symptoms _1    Comments pt encouraged to take rest breaks and PLB  -  -  -  -   Duration Continue with 30 min of aerobic exercise without signs/symptoms of physical distress. Continue with 30 min of aerobic exercise without signs/symptoms of physical distress. Continue with 30 min of aerobic exercise without signs/symptoms of physical distress. Continue with 30 min of aerobic exercise without signs/symptoms of physical distress. Continue with 30 min of aerobic exercise without signs/symptoms of physical distress.   Intensity _2      Progression   Progression Continue to progress workloads to maintain intensity without signs/symptoms of physical distress. Continue to progress workloads to maintain intensity without signs/symptoms of physical distress. Continue to progress workloads to maintain intensity without signs/symptoms of physical distress. Continue to progress workloads to maintain intensity without signs/symptoms of physical distress. Continue to progress workloads to maintain intensity without signs/symptoms of physical distress.   Average METs 2.5 3 3.5 2.8  2.9     Resistance Training   Training Prescription _3    Weight 2lbs 4lbs 4lbs 4lbs 3lbs   Reps 10-15 10-15 10-15 10-15 10-15   Time 10 Minutes 10 Minutes 10 Minutes 10 Minutes 10 Minutes     Oxygen   Oxygen _4    Liters 4L 4L 4L 4L 4L     Treadmill   MPH 2.2 2.2 2.2 2.3 2.3   Grade _5 Minutes _6 METs 2.99 2.99 2.99 3.71 3.71     Recumbant Bike   Level _7 Minutes _8 METs 2.1 3.1 2.9 2.1 2.6     NuStep   Level _9 SPM 85 90 90 90 80   Minutes _10 METs 2.4 3.4 4.1 2.5 2.5     Home Exercise Plan   Plans to continue exercise at  -  -  - [employer] (comment)  [employer] (comment)  [employer]   Frequency  -  -  - Add 2 additional days to program  exercise sessions. Add 2 additional days to program exercise sessions.   Initial Home Exercises Provided  -  -  - 11/21/16 11/21/16      Exercise Comments:     Exercise Comments    Row Name 11/20/16 1544 12/18/16 1448         Exercise Comments Reviewed METs and goals. Pt is making great progress in cardiac rehab and able to exercise without being limited with SOB symptoms; will continue to monitor activty levels and exercise progression. Reviewed METs and goals. Pt is making great progress in cardiac rehab and able to exercise without being limited with SOB symptoms; will continue to monitor activty levels and exercise progression.         Exercise Goals and Review:     Exercise Goals    Row Name 10/23/16 0911             Exercise Goals   Increase Physical Activity Yes       Intervention Develop an individualized exercise prescription for aerobic and resistive training based on initial evaluation findings, risk stratification, comorbidities and participant's personal goals.;Provide advice, education, support and counseling about physical activity/exercise needs.        Expected Outcomes Achievement of increased cardiorespiratory fitness and enhanced flexibility, muscular endurance and strength shown through measurements of functional capacity and personal statement of participant.       Increase Strength and Stamina Yes  wean off oxygen and lose wt/increase tone in mid section       Intervention Provide advice, education, support and counseling about physical activity/exercise needs.;Develop an individualized exercise prescription for aerobic and resistive training based on initial evaluation findings, risk stratification, comorbidities and participant's personal goals.       Expected Outcomes Achievement of increased cardiorespiratory fitness and enhanced flexibility, muscular endurance and strength shown through measurements of functional capacity and personal statement of participant.          Exercise Goals Re-Evaluation :     Exercise Goals Re-Evaluation    Row Name 11/20/16 1545 11/21/16 0943 12/18/16 1448         Exercise Goal Re-Evaluation   Exercise Goals Review Increase Physical Activity;Able to understand and use rate of perceived exertion (RPE) scale;Knowledge and understanding of Target Heart Rate Range (THRR);Increase Strength and Stamina;Able to understand and use Dyspnea scale;Able to check pulse independently;Understanding of Exercise Prescription  - Increase Physical Activity;Able to understand and use rate of perceived exertion (RPE) scale;Knowledge and understanding of Target Heart Rate Range (THRR);Understanding of Exercise Prescription;Increase Strength and Stamina;Able to check pulse independently     Comments Pt handles exercise WL increases very well. pt is able to exercise for 30 minutes with very little rest breaks for recovery.  Reviewed home exercise with pt today.  Pt plans to go to [facility] for exercise, 2x/week in addition to coming to cardiac rehab  Reviewed THR, pulse, RPE, sign and symptoms, and when to call 911 or MD.  Also  discussed weather considerations and indoor options.  Pt voiced understanding. Pt stated " having some leg fatigue, every now and then but overall feel better. Pt is able to exercise with very little rest breaks while maintaining 90% or above on O2.     Expected Outcomes Pt will continue to improve in cardiorespiratory fitness  Pt will continue to improve in cardiorespiratory fitness  Pt will continue to improve in cardiorespiratory fitness          Discharge Exercise Prescription (Final Exercise Prescription Changes):  Exercise Prescription Changes - 12/11/16 1300      Response to Exercise   Blood Pressure (Admit) 104/68   Blood Pressure (Exercise) 104/64   Blood Pressure (Exit) 104/64   Heart Rate (Admit) 100 bpm   Heart Rate (Exercise) 125 bpm   Heart Rate (Exit) 89 bpm   Oxygen Saturation (Admit) 100 %   Oxygen Saturation (Exercise) 92 %   Oxygen Saturation (Exit) 99 %   Rating of Perceived Exertion (Exercise) 12   Perceived Dyspnea (Exercise) 0   Symptoms none   Duration Continue with 30 min of aerobic exercise without signs/symptoms of physical distress.   Intensity THRR unchanged     Progression   Progression Continue to progress workloads to maintain intensity without signs/symptoms of physical distress.   Average METs 2.9     Resistance Training   Training Prescription Yes   Weight 3lbs   Reps 10-15   Time 10 Minutes     Oxygen   Oxygen Continuous   Liters 4L     Treadmill   MPH 2.3   Grade 3   Minutes 10   METs 3.71     Recumbant Bike   Level 2   Minutes 10   METs 2.6     NuStep   Level 3   SPM 80   Minutes 10   METs 2.5     Home Exercise Plan   Plans to continue exercise at [employer] (comment)  [employer]   Frequency Add 2 additional days to program exercise sessions.   Initial Home Exercises Provided 11/21/16      Nutrition:  Target Goals: Understanding of nutrition guidelines, daily intake of sodium <1554m, cholesterol <2054m  calories 30% from fat and 7% or less from saturated fats, daily to have 5 or more servings of fruits and vegetables.  Biometrics:     Pre Biometrics - 10/23/16 1203      Pre Biometrics   Height _0  (1.575 m)   Weight 139 lb 8.8 oz (63.3 kg)   Waist Circumference 30.5 inches   Hip Circumference 37.5 inches   Waist to Hip Ratio 0.81 %   BMI (Calculated) 25.6   Triceps Skinfold 34 mm   % Body Fat 36.3 %   Grip Strength 32 kg   Flexibility 12 in   Single Leg Stand 12 seconds       Nutrition Therapy Plan and Nutrition Goals:   Nutrition Discharge: Nutrition Scores:     Nutrition Assessments - 10/23/16 1510      MEDFICTS Scores   Pre Score 65      Nutrition Goals Re-Evaluation:   Nutrition Goals Re-Evaluation:   Nutrition Goals Discharge (Final Nutrition Goals Re-Evaluation):   Psychosocial: Target Goals: Acknowledge presence or absence of significant depression and/or stress, maximize coping skills, provide positive support system. Participant is able to verbalize types and ability to use techniques and skills needed for reducing stress and depression.  Initial Review & Psychosocial Screening:     Initial Psych Review & Screening - 10/23/16 1449      Initial Review   Current issues with Current Stress Concerns   Source of Stress Concerns Chronic Illness;Financial   Comments Pt reports med level of stress.  Pt is in the process of being worked up for pulmonary transplant.     Family Dynamics   Good Support System? Yes     Barriers   Psychosocial barriers to participate in program The patient should benefit from training in  stress management and relaxation.     Screening Interventions   Interventions Encouraged to exercise      Quality of Life Scores:     Quality of Life - 11/02/16 1700      Quality of Life Scores   Health/Function Pre --  pt with sarcoidosis, home oxygen demonstratesconcernsabout her health. although pt also demonstrates hopeful  outlook with good coping skills.    Socioeconomic Pre --  pt has recently received disability.  however pt interested in voc rehab. packet will be given.    GLOBAL Pre --  overall scores reflective of pt struggles from her disabling chronic illness. pt offered emotional support and reassurance. will continue to monitor.       PHQ-9: Recent Review Flowsheet Data    Depression screen [facility] 2/9 11/02/2016   Decreased Interest 0   Down, Depressed, Hopeless 0   PHQ - 2 Score 0     Interpretation of Total Score  Total Score Depression Severity:  1-4 = Minimal depression, 5-9 = Mild depression, 10-14 = Moderate depression, 15-19 = Moderately severe depression, 20-27 = Severe depression   Psychosocial Evaluation and Intervention:     Psychosocial Evaluation - 10/29/16 1655      Psychosocial Evaluation & Interventions   Interventions Encouraged to exercise with the program and follow exercise prescription;Stress management education;Relaxation education   Comments  pt with health related anxiety.  pt has suppotive family and friends.     Expected Outcomes pt will demonstrate improved outlook with good coping skills.    Continue Psychosocial Services  Follow up required by staff      Psychosocial Re-Evaluation:     Psychosocial Re-Evaluation    [place] Name 11/21/16 1454 12/12/16 1040           Psychosocial Re-Evaluation   Current issues with Current Stress Concerns;None Identified Current Stress Concerns;None Identified      Comments pt demonstrates improving health related anxiety.  pt demonstrates improved coping skills and outlook.   pt demonstrates improving health related anxiety.  pt demonstrates improved coping skills and outlook.        Expected Outcomes pt will exhibit positive outlook with good coping skills.  pt will exhibit positive outlook with good coping skills.       Interventions Encouraged to attend Cardiac Rehabilitation for the exercise;Stress management education  Encouraged to attend Cardiac Rehabilitation for the exercise;Stress management education      Continue Psychosocial Services  No Follow up required No Follow up required      Comments  - Pt reports med level of stress.  Pt is in the process of being worked up for pulmonary transplant.        Initial Review   Source of Stress Concerns  - Chronic Illness;Financial         Psychosocial Discharge (Final Psychosocial Re-Evaluation):     Psychosocial Re-Evaluation - 12/12/16 1040      Psychosocial Re-Evaluation   Current issues with Current Stress Concerns;None Identified   Comments pt demonstrates improving health related anxiety.  pt demonstrates improved coping skills and outlook.     Expected Outcomes pt will exhibit positive outlook with good coping skills.    Interventions Encouraged to attend Cardiac Rehabilitation for the exercise;Stress management education   Continue Psychosocial Services  No Follow up required   Comments Pt reports med level of stress.  Pt is in the process of being worked up for pulmonary transplant.     Initial Review  Source of Stress Concerns Chronic Illness;Financial      Vocational Rehabilitation: Provide vocational rehab assistance to qualifying candidates.   Vocational Rehab Evaluation & Intervention:     Vocational Rehab - 11/09/16 1657      Initial Vocational Rehab Evaluation & Intervention   Assessment shows need for Vocational Rehabilitation Yes   Vocational Rehab Packet given to patient 11/09/16      Education: Education Goals: Education classes will be provided on a weekly basis, covering required topics. Participant will state understanding/return demonstration of topics presented.  Learning Barriers/Preferences:     Learning Barriers/Preferences - 10/23/16 0910      Learning Barriers/Preferences   Learning Barriers Sight   Learning Preferences Written Material;Video;Verbal Instruction;Skilled Demonstration      Education  Topics: Count Your Pulse:  -Group instruction provided by verbal instruction, demonstration, patient participation and written materials to support subject.  Instructors address importance of being able to find your pulse and how to count your pulse when at home without a heart monitor.  Patients get hands on experience counting their pulse with staff help and individually.   Heart Attack, Angina, and Risk Factor Modification:  -Group instruction provided by verbal instruction, video, and written materials to support subject.  Instructors address signs and symptoms of angina and heart attacks.    Also discuss risk factors for heart disease and how to make changes to improve heart health risk factors.   Functional Fitness:  -Group instruction provided by verbal instruction, demonstration, patient participation, and written materials to support subject.  Instructors address safety measures for doing things around the house.  Discuss how to get up and down off the floor, how to pick things up properly, how to safely get out of a chair without assistance, and balance training.   Meditation and Mindfulness:  -Group instruction provided by verbal instruction, patient participation, and written materials to support subject.  Instructor addresses importance of mindfulness and meditation practice to help reduce stress and improve awareness.  Instructor also leads participants through a meditation exercise.    Stretching for Flexibility and Mobility:  -Group instruction provided by verbal instruction, patient participation, and written materials to support subject.  Instructors lead participants through series of stretches that are designed to increase flexibility thus improving mobility.  These stretches are additional exercise for major muscle groups that are typically performed during regular warm up and cool down.   CARDIAC REHAB PHASE II EXERCISE from 11/07/2016 in [place]  Date  11/02/16  Instruction Review Code  2- meets goals/outcomes      Hands Only CPR:  -Group verbal, video, and participation provides a basic overview of AHA guidelines for community CPR. Role-play of emergencies allow participants the opportunity to practice calling for help and chest compression technique with discussion of AED use.   Hypertension: -Group verbal and written instruction that provides a basic overview of hypertension including the most recent diagnostic guidelines, risk factor reduction with self-care instructions and medication management.    Nutrition I class: Heart Healthy Eating:  -Group instruction provided by PowerPoint slides, verbal discussion, and written materials to support subject matter. The instructor gives an explanation and review of the Therapeutic Lifestyle Changes diet recommendations, which includes a discussion on lipid goals, dietary fat, sodium, fiber, plant stanol/sterol esters, sugar, and the components of a well-balanced, healthy diet.   Nutrition II class: Lifestyle Skills:  -Group instruction provided by PowerPoint slides, verbal discussion, and written materials to support subject  matter. The instructor gives an explanation and review of label reading, grocery shopping for heart health, heart healthy recipe modifications, and ways to make healthier choices when eating out.   Diabetes Question & Answer:  -Group instruction provided by PowerPoint slides, verbal discussion, and written materials to support subject matter. The instructor gives an explanation and review of diabetes co-morbidities, pre- and post-prandial blood glucose goals, pre-exercise blood glucose goals, signs, symptoms, and treatment of hypoglycemia and hyperglycemia, and foot care basics.   Diabetes Blitz:  -Group instruction provided by PowerPoint slides, verbal discussion, and written materials to support subject matter. The instructor gives an explanation and  review of the physiology behind type 1 and type 2 diabetes, diabetes medications and rational behind using different medications, pre- and post-prandial blood glucose recommendations and Hemoglobin A1c goals, diabetes diet, and exercise including blood glucose guidelines for exercising safely.    Portion Distortion:  -Group instruction provided by PowerPoint slides, verbal discussion, written materials, and food models to support subject matter. The instructor gives an explanation of serving size versus portion size, changes in portions sizes over the last 20 years, and what consists of a serving from each food group.   CARDIAC REHAB PHASE II EXERCISE from 11/07/2016 in [place]  Date  10/31/16  Educator  RD  Instruction Review Code  2- meets goals/outcomes      Stress Management:  -Group instruction provided by verbal instruction, video, and written materials to support subject matter.  Instructors review role of stress in heart disease and how to cope with stress positively.     CARDIAC REHAB PHASE II EXERCISE from 11/07/2016 in [place]  Date  11/07/16  Instruction Review Code  2- meets goals/outcomes      Exercising on Your Own:  -Group instruction provided by verbal instruction, power point, and written materials to support subject.  Instructors discuss benefits of exercise, components of exercise, frequency and intensity of exercise, and end points for exercise.  Also discuss use of nitroglycerin and activating EMS.  Review options of places to exercise outside of rehab.  Review guidelines for sex with heart disease.   Cardiac Drugs I:  -Group instruction provided by verbal instruction and written materials to support subject.  Instructor reviews cardiac drug classes: antiplatelets, anticoagulants, beta blockers, and statins.  Instructor discusses reasons, side effects, and lifestyle considerations for each drug  class.   Cardiac Drugs II:  -Group instruction provided by verbal instruction and written materials to support subject.  Instructor reviews cardiac drug classes: angiotensin converting enzyme inhibitors (ACE-I), angiotensin II receptor blockers (ARBs), nitrates, and calcium channel blockers.  Instructor discusses reasons, side effects, and lifestyle considerations for each drug class.   Anatomy and Physiology of the Circulatory System:  Group verbal and written instruction and models provide basic cardiac anatomy and physiology, with the coronary electrical and arterial systems. Review of: AMI, Angina, Valve disease, Heart Failure, Peripheral Artery Disease, Cardiac Arrhythmia, Pacemakers, and the ICD.   Other Education:  -Group or individual verbal, written, or video instructions that support the educational goals of the cardiac rehab program.   Knowledge Questionnaire Score:     Knowledge Questionnaire Score - 10/23/16 1205      Knowledge Questionnaire Score   Pre Score 18/24      Core Components/Risk Factors/Patient Goals at Admission:     Personal Goals and Risk Factors at Admission - 10/23/16 1215      Core Components/Risk Factors/Patient  Goals on Admission    Weight Management Weight Loss;Yes   Intervention Weight Management: Develop a combined nutrition and exercise program designed to reach desired caloric intake, while maintaining appropriate intake of nutrient and fiber, sodium and fats, and appropriate energy expenditure required for the weight goal.   Expected Outcomes Weight Loss: Understanding of general recommendations for a balanced deficit meal plan, which promotes 1-2 lb weight loss per week and includes a negative energy balance of [phone] kcal/d   Lipids Yes   Intervention Provide education and support for participant on nutrition & aerobic/resistive exercise along with prescribed medications to achieve LDL <23m, HDL >455m   Expected Outcomes Short Term:  Participant states understanding of desired cholesterol values and is compliant with medications prescribed. Participant is following exercise prescription and nutrition guidelines.;Long Term: Cholesterol controlled with medications as prescribed, with individualized exercise RX and with personalized nutrition plan. Value goals: LDL < 7081mHDL > 40 mg.   Stress Yes   Intervention Offer individual and/or small group education and counseling on adjustment to heart disease, stress management and health-related lifestyle change. Teach and support self-help strategies.;Refer participants experiencing significant psychosocial distress to appropriate mental health specialists for further evaluation and treatment. When possible, include family members and significant others in education/counseling sessions.   Expected Outcomes Short Term: Participant demonstrates changes in health-related behavior, relaxation and other stress management skills, ability to obtain effective social support, and compliance with psychotropic medications if prescribed.;Long Term: Emotional wellbeing is indicated by absence of clinically significant psychosocial distress or social isolation.   Personal Goal Other Yes   Personal Goal Lose weight in mid-section. Improve endurance. Learn more about oxygen use.   Intervention Monitor oxygen saturation during exercise to see if SaO2 is maintained above 90%. Provide counseling regarding oxygen necessity to maintain oxygen saturation. Provide exercise counseling to help with weight loss and improved endurance.   Expected Outcomes Patient will be more knowledgeable about oxygen need and necessity to maintain oxygen saturation. Lose weight as measured by waist circumference. Improved endurance as measured by 6-minute walk test distance.      Core Components/Risk Factors/Patient Goals Review:      Goals and Risk Factor Review    Row Name 11/21/16 1450 12/12/16 1040           Core  Components/Risk Factors/Patient Goals Review   Personal Goals Review Weight Management/Obesity;Lipids;Stress;Other Weight Management/Obesity;Lipids;Stress;Other      Review pt demonstrates eagerness to participate in CR exercise, nutrition and education.  pt feels more comfortable with oxygen therapy during exercise.   pt demonstrates eagerness to participate in CR exercise, nutrition and education.  pt feels more comfortable with oxygen therapy during exercise.        Expected Outcomes pt will participate in exercise, nutrition and lifestyle modifications to reduce overall RF.   pt will participate in exercise, nutrition and lifestyle modifications to reduce overall RF.           Core Components/Risk Factors/Patient Goals at Discharge (Final Review):      Goals and Risk Factor Review - 12/12/16 1040      Core Components/Risk Factors/Patient Goals Review   Personal Goals Review Weight Management/Obesity;Lipids;Stress;Other   Review pt demonstrates eagerness to participate in CR exercise, nutrition and education.  pt feels more comfortable with oxygen therapy during exercise.     Expected Outcomes pt will participate in exercise, nutrition and lifestyle modifications to reduce overall RF.        ITP Comments:  ITP Comments    Row Name 10/23/16 8677 11/21/16 1447 12/18/16 1654       ITP Comments Medical Director, Dr. Fransico Him Medical Director, Dr. Fransico Him 30day ITP review.  pt with good attendance and participation.         Comments:

## 2016-12-19 ENCOUNTER — Encounter (HOSPITAL_COMMUNITY)
Admission: RE | Admit: 2016-12-19 | Discharge: 2016-12-19 | Disposition: A | Discharge status: Home / Self Care | Payer: Medicaid Other | Source: Ambulatory Visit | Attending: Cardiology | Admitting: Cardiology

## 2016-12-19 DIAGNOSIS — Z955 Presence of coronary angioplasty implant and graft: Secondary | ICD-10-CM | POA: Diagnosis not present

## 2016-12-20 ENCOUNTER — Encounter (HOSPITAL_COMMUNITY): Payer: Medicaid Other

## 2016-12-21 ENCOUNTER — Encounter (HOSPITAL_COMMUNITY): Payer: Medicaid Other

## 2016-12-24 ENCOUNTER — Telehealth: Payer: Self-pay | Admitting: Pulmonary Disease

## 2016-12-24 ENCOUNTER — Encounter (HOSPITAL_COMMUNITY)
Admission: RE | Admit: 2016-12-24 | Discharge: 2016-12-24 | Disposition: A | Discharge status: Home / Self Care | Payer: Medicaid Other | Source: Ambulatory Visit | Attending: Cardiology | Admitting: Cardiology

## 2016-12-24 DIAGNOSIS — Z955 Presence of coronary angioplasty implant and graft: Secondary | ICD-10-CM

## 2016-12-24 NOTE — Telephone Encounter (Signed)
Spoke with patient. She wants to speak with JN personally about her most recent at [facility]. Her phone number is [phone].   Advised patient that Durene Cal was in clinic today but that he would call when he has a free moment. She verbalized understanding and stated that it was not an emergency and that she would wait on his call.   Will route to [place].

## 2016-12-25 ENCOUNTER — Telehealth: Payer: Self-pay | Admitting: Pulmonary Disease

## 2016-12-25 ENCOUNTER — Encounter (HOSPITAL_COMMUNITY): Payer: Medicaid Other

## 2016-12-25 NOTE — Telephone Encounter (Signed)
Paperwork has been given to Dr. Ashok Cordia to review and sign. Will give back to [facility] once this has been completed.

## 2016-12-26 ENCOUNTER — Encounter (HOSPITAL_COMMUNITY)
Admission: RE | Admit: 2016-12-26 | Discharge: 2016-12-26 | Disposition: A | Discharge status: Home / Self Care | Payer: Medicaid Other | Source: Ambulatory Visit | Attending: Cardiology | Admitting: Cardiology

## 2016-12-26 DIAGNOSIS — Z955 Presence of coronary angioplasty implant and graft: Secondary | ICD-10-CM

## 2016-12-26 NOTE — Telephone Encounter (Signed)
Nothing further needed at this time- will close encounter.

## 2016-12-26 NOTE — Telephone Encounter (Signed)
Notified by patient that [facility] is continuing to follow her peripherally given her recent clinical stabilization with her current regimen. They feel as I do that patient will likely ultimately need lung transplantation; however, at this time, transplant should be deferred as long as possible. I also reviewed her paperwork with regards to her disability insurance. An informed her that this form will be filled out to the best of my ability and available for her tomorrow. I instructed her to contact me if she needed anything further and wished her all the best.

## 2016-12-27 ENCOUNTER — Encounter (HOSPITAL_COMMUNITY): Payer: Medicaid Other

## 2016-12-27 NOTE — Telephone Encounter (Signed)
Rec'd completed paperwork - fwd to [employer] via interoffice mail - 12/27/16 -pr

## 2016-12-28 ENCOUNTER — Encounter (HOSPITAL_COMMUNITY): Payer: Medicaid Other

## 2016-12-31 ENCOUNTER — Encounter (HOSPITAL_COMMUNITY): Payer: Medicaid Other

## 2017-01-01 ENCOUNTER — Encounter (HOSPITAL_COMMUNITY): Payer: Medicaid Other

## 2017-01-02 ENCOUNTER — Encounter (HOSPITAL_COMMUNITY): Payer: Medicaid Other

## 2017-01-03 ENCOUNTER — Encounter (HOSPITAL_COMMUNITY): Payer: Medicaid Other

## 2017-01-04 ENCOUNTER — Encounter (HOSPITAL_COMMUNITY): Payer: Medicaid Other

## 2017-01-07 ENCOUNTER — Encounter (HOSPITAL_COMMUNITY): Payer: Medicaid Other

## 2017-01-08 ENCOUNTER — Encounter (HOSPITAL_COMMUNITY): Payer: Medicaid Other

## 2017-01-09 ENCOUNTER — Encounter (HOSPITAL_COMMUNITY)
Admission: RE | Admit: 2017-01-09 | Discharge: 2017-01-09 | Disposition: A | Discharge status: Home / Self Care | Payer: Medicaid Other | Source: Ambulatory Visit | Attending: Cardiology | Admitting: Cardiology

## 2017-01-09 DIAGNOSIS — Z955 Presence of coronary angioplasty implant and graft: Secondary | ICD-10-CM | POA: Diagnosis not present

## 2017-01-10 ENCOUNTER — Encounter (HOSPITAL_COMMUNITY): Payer: Medicaid Other

## 2017-01-11 ENCOUNTER — Encounter (HOSPITAL_COMMUNITY)
Admission: RE | Admit: 2017-01-11 | Discharge: 2017-01-11 | Disposition: A | Discharge status: Home / Self Care | Payer: Medicaid Other | Source: Ambulatory Visit | Attending: Cardiology | Admitting: Cardiology

## 2017-01-11 VITALS — Ht 62.0 in | Wt 134.7 lb

## 2017-01-11 DIAGNOSIS — Z955 Presence of coronary angioplasty implant and graft: Secondary | ICD-10-CM | POA: Diagnosis present

## 2017-01-11 NOTE — Progress Notes (Signed)
Cardiac Individual Treatment Plan  Patient Details  Name: Erin Zavala MRN: [id number] Date of Birth: 03/14/1965 Referring Provider:     [facility] from 10/23/2016 in [place]  Referring Provider  Loralie Champagne, MD.      Initial Encounter Date:    CARDIAC REHAB PHASE II ORIENTATION from 10/23/2016 in [place]  Date  10/23/16  Referring Provider  Loralie Champagne, MD.      Visit Diagnosis: 07/16/16 Status post coronary artery stent placement  Patient's Home Medications on Admission:  Current Outpatient Prescriptions:  .  ambrisentan (LETAIRIS) 10 MG tablet, Take 1 tablet (10 mg total) by mouth daily., Disp: 30 tablet, Rfl: 11 .  aspirin 81 MG tablet, Take 1 tablet (81 mg total) by mouth daily., Disp: 30 tablet, Rfl: 0 .  atorvastatin (LIPITOR) 40 MG tablet, Take 1 tablet (40 mg total) by mouth daily., Disp: 30 tablet, Rfl: 3 .  beclomethasone (QVAR) 80 MCG/ACT inhaler, Inhale 2 puffs into the lungs 2 (two) times daily., Disp: 1 Inhaler, Rfl: 6 .  clopidogrel (PLAVIX) 75 MG tablet, Take 1 tablet (75 mg total) by mouth daily., Disp: 30 tablet, Rfl: 11 .  esomeprazole (NEXIUM) 40 MG capsule, TAKE 1 CAPSULE BY MOUTH DAILY AT NOON, Disp: 30 capsule, Rfl: 3 .  ferrous sulfate 325 (65 FE) MG tablet, Take 1 tablet (325 mg total) by mouth 2 (two) times daily with a meal., Disp: 60 tablet, Rfl: 3 .  predniSONE (DELTASONE) 2.5 MG tablet, Take 1 tablet (2.5 mg total) by mouth daily with breakfast., Disp: 120 tablet, Rfl: 0 .  predniSONE (DELTASONE) 2.5 MG tablet, TAKE 3 TABLETS DAILY FOR FOUR WEEKS THEN 2 TABLETS DAILY UNTIL NEXT OFFICE VISIT., Disp: 150 tablet, Rfl: 0 .  ranitidine (ZANTAC) 150 MG tablet, TAKE 1 TABLET (150 MG TOTAL) BY MOUTH AT BEDTIME., Disp: 30 tablet, Rfl: 3 .  tadalafil, PAH, (ADCIRCA) 20 MG tablet, Take 2 tablets (40 mg total) by mouth daily., Disp: 180 tablet, Rfl: 3  Past Medical  History: Past Medical History:  Diagnosis Date  . Asthma    as a child  . Cough   . Dyspnea   . GERD (gastroesophageal reflux disease)   . Migraines   . Oxygen dependent   . Pulmonary hypertension ([place])   . Sarcoidosis     Tobacco Use: History  Smoking Status  . Former Smoker  . Packs/day: 0.25  . Years: 10.00  . Types: Cigarettes  . Quit date: 01/11/2011  Smokeless Tobacco  . Never Used    Labs: Recent Review Flowsheet Data    Labs for ITP Cardiac and Pulmonary Rehab Latest Ref Rng & Units 11/02/2015 12/12/2015 12/12/2015 10/15/2016 12/17/2016   Cholestrol 0 - 200 mg/dL 218(H) - - 170 117   LDLCALC 0 - 99 mg/dL 164(H) - - 100(H) 62   HDL >40 mg/dL 40(L) - - 50 40(L)   Trlycerides <150 mg/dL 68 - - 101 73   Hemoglobin A1c 4.8 - 5.6 % 6.2(H) - - - -   PHART 7.350 - 7.450 7.412 - - - -   PCO2ART 35.0 - 45.0 mmHg 34.7(L) - - - -   HCO3 20.0 - 28.0 mmol/L 22.1 25.5 26.1 - -   TCO2 0 - 100 mmol/L _0 - -   ACIDBASEDEF 0.0 - 2.0 mmol/L 2.0 1.0 - - -   O2SAT % 99.0 74.0 75.0 - -  Capillary Blood Glucose: No results found for: GLUCAP   Exercise Target Goals:    Exercise Program Goal: Individual exercise prescription set with THRR, safety & activity barriers. Participant demonstrates ability to understand and report RPE using BORG scale, to self-measure pulse accurately, and to acknowledge the importance of the exercise prescription.  Exercise Prescription Goal: Starting with aerobic activity 30 plus minutes a day, 3 days per week for initial exercise prescription. Provide home exercise prescription and guidelines that participant acknowledges understanding prior to discharge.  Activity Barriers & Risk Stratification:     Activity Barriers & Cardiac Risk Stratification - 10/23/16 0911      Activity Barriers & Cardiac Risk Stratification   Activity Barriers Other (comment)   Comments some achiness in leg region due to meds   Cardiac Risk Stratification High       6 Minute Walk:     6 Minute Walk    Row Name 10/23/16 1158         6 Minute Walk   Phase Initial     Distance 1342 feet     Walk Time 6 minutes     # of Rest Breaks 0     MPH 2.54     METS 4.59     RPE 11     Perceived Dyspnea  1     VO2 Peak 16.06     Symptoms Yes (comment)     Comments Shortness of breath     Resting HR 87 bpm     Resting BP 116/68     Max Ex. HR 144 bpm     Max Ex. BP 140/62     2 Minute Post BP 118/62       Interval Oxygen   Interval Oxygen? Yes     Baseline Oxygen Saturation % 98 %     Resting Liters of Oxygen 3 L     3 Minute Oxygen Saturation % 88 %     3 Minute Liters of Oxygen 4 L     6 Minute Oxygen Saturation % 98 %     6 Minute Liters of Oxygen 4 L     2 Minute Post Oxygen Saturation % 99 %     2 Minute Post Liters of Oxygen 3 L        Oxygen Initial Assessment:   Oxygen Re-Evaluation:   Oxygen Discharge (Final Oxygen Re-Evaluation):   Initial Exercise Prescription:     Initial Exercise Prescription - 10/23/16 1200      Date of Initial Exercise RX and Referring Provider   Date 10/23/16   Referring Provider Loralie Champagne, MD.     Oxygen   Oxygen Continuous   Liters 4L     Treadmill   MPH 2.2   Grade 1   Minutes 10   METs 2.81     Bike   Level 1   Minutes 10   METs 4.02     NuStep   Level 2   SPM 85   Minutes 10   METs 2.5     Prescription Details   Frequency (times per week) 3   Duration Progress to 30 minutes of continuous aerobic without signs/symptoms of physical distress     Intensity   THRR 40-80% of Max Heartrate 68-136   Ratings of Perceived Exertion 11-13   Perceived Dyspnea 0-4     Progression   Progression Continue to progress workloads to maintain intensity without signs/symptoms of physical distress.  Resistance Training   Training Prescription Yes   Weight 2lbs   Reps 10-15      Perform Capillary Blood Glucose checks as needed.  Exercise Prescription Changes:      Exercise Prescription Changes    Row Name 10/29/16 1543 11/09/16 1541 11/20/16 1500 11/30/16 1337 12/11/16 1300     Response to Exercise   Blood Pressure (Admit) 104/68 112/60 104/68 100/64 104/68   Blood Pressure (Exercise) 136/80 132/60 118/70 130/60 104/64   Blood Pressure (Exit) 118/82 108/62 120/66 108/62 104/64   Heart Rate (Admit) 82 bpm 96 bpm 91 bpm 90 bpm 100 bpm   Heart Rate (Exercise) 150 bpm 134 bpm 136 bpm 136 bpm 125 bpm   Heart Rate (Exit) 80 bpm 88 bpm 89 bpm 78 bpm 89 bpm   Oxygen Saturation (Admit)  -  - 99 % 97 % 100 %   Oxygen Saturation (Exercise)  -  - 92 % 90 % 92 %   Oxygen Saturation (Exit)  -  - 100 % 99 % 99 %   Rating of Perceived Exertion (Exercise) _0 Perceived Dyspnea (Exercise)  -  - 1 0 0   Symptoms _1    Comments pt encouraged to take rest breaks and PLB  -  -  -  -   Duration Continue with 30 min of aerobic exercise without signs/symptoms of physical distress. Continue with 30 min of aerobic exercise without signs/symptoms of physical distress. Continue with 30 min of aerobic exercise without signs/symptoms of physical distress. Continue with 30 min of aerobic exercise without signs/symptoms of physical distress. Continue with 30 min of aerobic exercise without signs/symptoms of physical distress.   Intensity _2      Progression   Progression Continue to progress workloads to maintain intensity without signs/symptoms of physical distress. Continue to progress workloads to maintain intensity without signs/symptoms of physical distress. Continue to progress workloads to maintain intensity without signs/symptoms of physical distress. Continue to progress workloads to maintain intensity without signs/symptoms of physical distress. Continue to progress workloads to maintain intensity without signs/symptoms of physical distress.   Average METs 2.5 3 3.5 2.8  2.9     Resistance Training   Training Prescription _3    Weight 2lbs 4lbs 4lbs 4lbs 3lbs   Reps 10-15 10-15 10-15 10-15 10-15   Time 10 Minutes 10 Minutes 10 Minutes 10 Minutes 10 Minutes     Oxygen   Oxygen _4    Liters 4L 4L 4L 4L 4L     Treadmill   MPH 2.2 2.2 2.2 2.3 2.3   Grade _5 Minutes _6 METs 2.99 2.99 2.99 3.71 3.71     Recumbant Bike   Level _7 Minutes _8 METs 2.1 3.1 2.9 2.1 2.6     NuStep   Level _9 SPM 85 90 90 90 80   Minutes _10 METs 2.4 3.4 4.1 2.5 2.5     Home Exercise Plan   Plans to continue exercise at  -  -  - [employer] (comment)  [employer] (comment)  [employer]   Frequency  -  -  - Add 2 additional days to program  exercise sessions. Add 2 additional days to program exercise sessions.   Initial Home Exercises Provided  -  -  - 11/21/16 11/21/16   Row Name 12/26/16 1514             Response to Exercise   Blood Pressure (Admit) 98/64       Blood Pressure (Exercise) 142/74       Blood Pressure (Exit) 104/70       Heart Rate (Admit) 73 bpm       Heart Rate (Exercise) 136 bpm       Heart Rate (Exit) 76 bpm       Oxygen Saturation (Admit) 100 %       Oxygen Saturation (Exercise) 89 %       Oxygen Saturation (Exit) 93 %       Rating of Perceived Exertion (Exercise) 12       Perceived Dyspnea (Exercise) 0       Symptoms none       Duration Continue with 30 min of aerobic exercise without signs/symptoms of physical distress.       Intensity THRR unchanged         Progression   Progression Continue to progress workloads to maintain intensity without signs/symptoms of physical distress.       Average METs 2.8         Resistance Training   Training Prescription Yes       Weight 3lbs       Reps 10-15       Time 10 Minutes         Oxygen   Oxygen Continuous       Liters 4L          Treadmill   MPH 2.3       Grade 3       Minutes 10       METs 3.71         Recumbant Bike   Level 2       Minutes 10       METs 2.4         NuStep   Level 4       SPM 80       Minutes 10       METs 2.4         Home Exercise Plan   Plans to continue exercise at [employer] (comment)  [employer]       Frequency Add 2 additional days to program exercise sessions.       Initial Home Exercises Provided 11/21/16          Exercise Comments:     Exercise Comments    Row Name 11/20/16 1544 12/18/16 1448 01/08/17 1516       Exercise Comments Reviewed METs and goals. Pt is making great progress in cardiac rehab and able to exercise without being limited with SOB symptoms; will continue to monitor activty levels and exercise progression. Reviewed METs and goals. Pt is making great progress in cardiac rehab and able to exercise without being limited with SOB symptoms; will continue to monitor activty levels and exercise progression. Reviewed METs and goals. Pt is making great progress in cardiac rehab and able to exercise without being limited with SOB symptoms; will continue to monitor activty levels and exercise progression.        Exercise Goals and Review:     Exercise Goals    Row Name 10/23/16 [phone]  Exercise Goals   Increase Physical Activity Yes       Intervention Develop an individualized exercise prescription for aerobic and resistive training based on initial evaluation findings, risk stratification, comorbidities and participant's personal goals.;Provide advice, education, support and counseling about physical activity/exercise needs.       Expected Outcomes Achievement of increased cardiorespiratory fitness and enhanced flexibility, muscular endurance and strength shown through measurements of functional capacity and personal statement of participant.       Increase Strength and Stamina Yes  wean off oxygen and lose wt/increase tone in mid section        Intervention Provide advice, education, support and counseling about physical activity/exercise needs.;Develop an individualized exercise prescription for aerobic and resistive training based on initial evaluation findings, risk stratification, comorbidities and participant's personal goals.       Expected Outcomes Achievement of increased cardiorespiratory fitness and enhanced flexibility, muscular endurance and strength shown through measurements of functional capacity and personal statement of participant.          Exercise Goals Re-Evaluation :     Exercise Goals Re-Evaluation    Row Name 11/20/16 1545 11/21/16 0943 12/18/16 1448 01/08/17 1516       Exercise Goal Re-Evaluation   Exercise Goals Review Increase Physical Activity;Able to understand and use rate of perceived exertion (RPE) scale;Knowledge and understanding of Target Heart Rate Range (THRR);Increase Strength and Stamina;Able to understand and use Dyspnea scale;Able to check pulse independently;Understanding of Exercise Prescription  - Increase Physical Activity;Able to understand and use rate of perceived exertion (RPE) scale;Knowledge and understanding of Target Heart Rate Range (THRR);Understanding of Exercise Prescription;Increase Strength and Stamina;Able to check pulse independently Increase Physical Activity;Able to understand and use rate of perceived exertion (RPE) scale;Knowledge and understanding of Target Heart Rate Range (THRR);Understanding of Exercise Prescription;Increase Strength and Stamina;Able to check pulse independently    Comments Pt handles exercise WL increases very well. pt is able to exercise for 30 minutes with very little rest breaks for recovery.  Reviewed home exercise with pt today.  Pt plans to go to [facility] for exercise, 2x/week in addition to coming to cardiac rehab  Reviewed THR, pulse, RPE, sign and symptoms, and when to call 911 or MD.  Also discussed weather considerations and indoor options.  Pt  voiced understanding. Pt stated " having some leg fatigue, every now and then but overall feel better. Pt is able to exercise with very little rest breaks while maintaining 90% or above on O2. Pt has been under the weather will cold/flu like symptoms. Expressed the importance of rest, hydration and contacting PCP if symptoms worsen or do not get better within 1 week. Pt voiced understanding.     Expected Outcomes Pt will continue to improve in cardiorespiratory fitness  Pt will continue to improve in cardiorespiratory fitness  Pt will continue to improve in cardiorespiratory fitness  Pt cold-like symptoms will lessen, and patient will get energy back to return to activity/exercise.        Discharge Exercise Prescription (Final Exercise Prescription Changes):     Exercise Prescription Changes - 12/26/16 1514      Response to Exercise   Blood Pressure (Admit) 98/64   Blood Pressure (Exercise) 142/74   Blood Pressure (Exit) 104/70   Heart Rate (Admit) 73 bpm   Heart Rate (Exercise) 136 bpm   Heart Rate (Exit) 76 bpm   Oxygen Saturation (Admit) 100 %   Oxygen Saturation (Exercise) 89 %   Oxygen Saturation (Exit) 93 %  Rating of Perceived Exertion (Exercise) 12   Perceived Dyspnea (Exercise) 0   Symptoms none   Duration Continue with 30 min of aerobic exercise without signs/symptoms of physical distress.   Intensity THRR unchanged     Progression   Progression Continue to progress workloads to maintain intensity without signs/symptoms of physical distress.   Average METs 2.8     Resistance Training   Training Prescription Yes   Weight 3lbs   Reps 10-15   Time 10 Minutes     Oxygen   Oxygen Continuous   Liters 4L     Treadmill   MPH 2.3   Grade 3   Minutes 10   METs 3.71     Recumbant Bike   Level 2   Minutes 10   METs 2.4     NuStep   Level 4   SPM 80   Minutes 10   METs 2.4     Home Exercise Plan   Plans to continue exercise at [employer] (comment)   [employer]   Frequency Add 2 additional days to program exercise sessions.   Initial Home Exercises Provided 11/21/16      Nutrition:  Target Goals: Understanding of nutrition guidelines, daily intake of sodium <1560m, cholesterol <2014m calories 30% from fat and 7% or less from saturated fats, daily to have 5 or more servings of fruits and vegetables.  Biometrics:     Pre Biometrics - 10/23/16 1203      Pre Biometrics   Height _0  (1.575 m)   Weight 139 lb 8.8 oz (63.3 kg)   Waist Circumference 30.5 inches   Hip Circumference 37.5 inches   Waist to Hip Ratio 0.81 %   BMI (Calculated) 25.6   Triceps Skinfold 34 mm   % Body Fat 36.3 %   Grip Strength 32 kg   Flexibility 12 in   Single Leg Stand 12 seconds       Nutrition Therapy Plan and Nutrition Goals:   Nutrition Discharge: Nutrition Scores:     Nutrition Assessments - 10/23/16 1510      MEDFICTS Scores   Pre Score 65      Nutrition Goals Re-Evaluation:   Nutrition Goals Re-Evaluation:   Nutrition Goals Discharge (Final Nutrition Goals Re-Evaluation):   Psychosocial: Target Goals: Acknowledge presence or absence of significant depression and/or stress, maximize coping skills, provide positive support system. Participant is able to verbalize types and ability to use techniques and skills needed for reducing stress and depression.  Initial Review & Psychosocial Screening:     Initial Psych Review & Screening - 10/23/16 1449      Initial Review   Current issues with Current Stress Concerns   Source of Stress Concerns Chronic Illness;Financial   Comments Pt reports med level of stress.  Pt is in the process of being worked up for pulmonary transplant.     Family Dynamics   Good Support System? Yes     Barriers   Psychosocial barriers to participate in program The patient should benefit from training in stress management and relaxation.     Screening Interventions   Interventions Encouraged to  exercise      Quality of Life Scores:     Quality of Life - 11/02/16 1700      Quality of Life Scores   Health/Function Pre --  pt with sarcoidosis, home oxygen demonstratesconcernsabout her health. although pt also demonstrates hopeful outlook with good coping skills.    Socioeconomic Pre --  pt has recently received disability.  however pt interested in voc rehab. packet will be given.    GLOBAL Pre --  overall scores reflective of pt struggles from her disabling chronic illness. pt offered emotional support and reassurance. will continue to monitor.       PHQ-9: Recent Review Flowsheet Data    Depression screen [facility] 2/9 11/02/2016   Decreased Interest 0   Down, Depressed, Hopeless 0   PHQ - 2 Score 0     Interpretation of Total Score  Total Score Depression Severity:  1-4 = Minimal depression, 5-9 = Mild depression, 10-14 = Moderate depression, 15-19 = Moderately severe depression, 20-27 = Severe depression   Psychosocial Evaluation and Intervention:     Psychosocial Evaluation - 10/29/16 1655      Psychosocial Evaluation & Interventions   Interventions Encouraged to exercise with the program and follow exercise prescription;Stress management education;Relaxation education   Comments  pt with health related anxiety.  pt has suppotive family and friends.     Expected Outcomes pt will demonstrate improved outlook with good coping skills.    Continue Psychosocial Services  Follow up required by staff      Psychosocial Re-Evaluation:     Psychosocial Re-Evaluation    [place] Name 11/21/16 1454 12/12/16 1040 01/08/17 1507         Psychosocial Re-Evaluation   Current issues with Current Stress Concerns;None Identified Current Stress Concerns;None Identified Current Stress Concerns;None Identified     Comments pt demonstrates improving health related anxiety.  pt demonstrates improved coping skills and outlook.   pt demonstrates improving health related anxiety.  pt  demonstrates improved coping skills and outlook.   pt demonstrates improving health related anxiety.  pt demonstrates improved coping skills and outlook.       Expected Outcomes pt will exhibit positive outlook with good coping skills.  pt will exhibit positive outlook with good coping skills.  pt will exhibit positive outlook with good coping skills.      Interventions Encouraged to attend Cardiac Rehabilitation for the exercise;Stress management education Encouraged to attend Cardiac Rehabilitation for the exercise;Stress management education Encouraged to attend Cardiac Rehabilitation for the exercise;Stress management education     Continue Psychosocial Services  No Follow up required No Follow up required No Follow up required     Comments  - Pt reports med level of stress.  Pt is in the process of being worked up for pulmonary transplant. Pt reports med level of stress.  Pt is in the process of being worked up for pulmonary transplant.       Initial Review   Source of Stress Concerns  - Chronic Illness;Financial Chronic Illness;Financial        Psychosocial Discharge (Final Psychosocial Re-Evaluation):     Psychosocial Re-Evaluation - 01/08/17 1507      Psychosocial Re-Evaluation   Current issues with Current Stress Concerns;None Identified   Comments pt demonstrates improving health related anxiety.  pt demonstrates improved coping skills and outlook.     Expected Outcomes pt will exhibit positive outlook with good coping skills.    Interventions Encouraged to attend Cardiac Rehabilitation for the exercise;Stress management education   Continue Psychosocial Services  No Follow up required   Comments Pt reports med level of stress.  Pt is in the process of being worked up for pulmonary transplant.     Initial Review   Source of Stress Concerns Chronic Illness;Financial      Vocational Rehabilitation: Provide vocational rehab assistance  to qualifying candidates.   Vocational  Rehab Evaluation & Intervention:     Vocational Rehab - 11/09/16 1657      Initial Vocational Rehab Evaluation & Intervention   Assessment shows need for Vocational Rehabilitation Yes   Vocational Rehab Packet given to patient 11/09/16      Education: Education Goals: Education classes will be provided on a weekly basis, covering required topics. Participant will state understanding/return demonstration of topics presented.  Learning Barriers/Preferences:     Learning Barriers/Preferences - 10/23/16 0910      Learning Barriers/Preferences   Learning Barriers Sight   Learning Preferences Written Material;Video;Verbal Instruction;Skilled Demonstration      Education Topics: Count Your Pulse:  -Group instruction provided by verbal instruction, demonstration, patient participation and written materials to support subject.  Instructors address importance of being able to find your pulse and how to count your pulse when at home without a heart monitor.  Patients get hands on experience counting their pulse with staff help and individually.   Heart Attack, Angina, and Risk Factor Modification:  -Group instruction provided by verbal instruction, video, and written materials to support subject.  Instructors address signs and symptoms of angina and heart attacks.    Also discuss risk factors for heart disease and how to make changes to improve heart health risk factors.   Functional Fitness:  -Group instruction provided by verbal instruction, demonstration, patient participation, and written materials to support subject.  Instructors address safety measures for doing things around the house.  Discuss how to get up and down off the floor, how to pick things up properly, how to safely get out of a chair without assistance, and balance training.   Meditation and Mindfulness:  -Group instruction provided by verbal instruction, patient participation, and written materials to support subject.   Instructor addresses importance of mindfulness and meditation practice to help reduce stress and improve awareness.  Instructor also leads participants through a meditation exercise.    Stretching for Flexibility and Mobility:  -Group instruction provided by verbal instruction, patient participation, and written materials to support subject.  Instructors lead participants through series of stretches that are designed to increase flexibility thus improving mobility.  These stretches are additional exercise for major muscle groups that are typically performed during regular warm up and cool down.   CARDIAC REHAB PHASE II EXERCISE from 11/07/2016 in [place]  Date  11/02/16  Instruction Review Code  2- meets goals/outcomes      Hands Only CPR:  -Group verbal, video, and participation provides a basic overview of AHA guidelines for community CPR. Role-play of emergencies allow participants the opportunity to practice calling for help and chest compression technique with discussion of AED use.   Hypertension: -Group verbal and written instruction that provides a basic overview of hypertension including the most recent diagnostic guidelines, risk factor reduction with self-care instructions and medication management.    Nutrition I class: Heart Healthy Eating:  -Group instruction provided by PowerPoint slides, verbal discussion, and written materials to support subject matter. The instructor gives an explanation and review of the Therapeutic Lifestyle Changes diet recommendations, which includes a discussion on lipid goals, dietary fat, sodium, fiber, plant stanol/sterol esters, sugar, and the components of a well-balanced, healthy diet.   Nutrition II class: Lifestyle Skills:  -Group instruction provided by PowerPoint slides, verbal discussion, and written materials to support subject matter. The instructor gives an explanation and review of label reading, grocery  shopping for heart health,  heart healthy recipe modifications, and ways to make healthier choices when eating out.   Diabetes Question & Answer:  -Group instruction provided by PowerPoint slides, verbal discussion, and written materials to support subject matter. The instructor gives an explanation and review of diabetes co-morbidities, pre- and post-prandial blood glucose goals, pre-exercise blood glucose goals, signs, symptoms, and treatment of hypoglycemia and hyperglycemia, and foot care basics.   Diabetes Blitz:  -Group instruction provided by PowerPoint slides, verbal discussion, and written materials to support subject matter. The instructor gives an explanation and review of the physiology behind type 1 and type 2 diabetes, diabetes medications and rational behind using different medications, pre- and post-prandial blood glucose recommendations and Hemoglobin A1c goals, diabetes diet, and exercise including blood glucose guidelines for exercising safely.    Portion Distortion:  -Group instruction provided by PowerPoint slides, verbal discussion, written materials, and food models to support subject matter. The instructor gives an explanation of serving size versus portion size, changes in portions sizes over the last 20 years, and what consists of a serving from each food group.   CARDIAC REHAB PHASE II EXERCISE from 11/07/2016 in [place]  Date  10/31/16  Educator  RD  Instruction Review Code  2- meets goals/outcomes      Stress Management:  -Group instruction provided by verbal instruction, video, and written materials to support subject matter.  Instructors review role of stress in heart disease and how to cope with stress positively.     CARDIAC REHAB PHASE II EXERCISE from 11/07/2016 in [place]  Date  11/07/16  Instruction Review Code  2- meets goals/outcomes      Exercising on Your Own:  -Group instruction  provided by verbal instruction, power point, and written materials to support subject.  Instructors discuss benefits of exercise, components of exercise, frequency and intensity of exercise, and end points for exercise.  Also discuss use of nitroglycerin and activating EMS.  Review options of places to exercise outside of rehab.  Review guidelines for sex with heart disease.   Cardiac Drugs I:  -Group instruction provided by verbal instruction and written materials to support subject.  Instructor reviews cardiac drug classes: antiplatelets, anticoagulants, beta blockers, and statins.  Instructor discusses reasons, side effects, and lifestyle considerations for each drug class.   Cardiac Drugs II:  -Group instruction provided by verbal instruction and written materials to support subject.  Instructor reviews cardiac drug classes: angiotensin converting enzyme inhibitors (ACE-I), angiotensin II receptor blockers (ARBs), nitrates, and calcium channel blockers.  Instructor discusses reasons, side effects, and lifestyle considerations for each drug class.   Anatomy and Physiology of the Circulatory System:  Group verbal and written instruction and models provide basic cardiac anatomy and physiology, with the coronary electrical and arterial systems. Review of: AMI, Angina, Valve disease, Heart Failure, Peripheral Artery Disease, Cardiac Arrhythmia, Pacemakers, and the ICD.   Other Education:  -Group or individual verbal, written, or video instructions that support the educational goals of the cardiac rehab program.   Knowledge Questionnaire Score:     Knowledge Questionnaire Score - 10/23/16 1205      Knowledge Questionnaire Score   Pre Score 18/24      Core Components/Risk Factors/Patient Goals at Admission:     Personal Goals and Risk Factors at Admission - 10/23/16 1215      Core Components/Risk Factors/Patient Goals on Admission    Weight Management Weight Loss;Yes   Intervention  Weight Management: Develop  a combined nutrition and exercise program designed to reach desired caloric intake, while maintaining appropriate intake of nutrient and fiber, sodium and fats, and appropriate energy expenditure required for the weight goal.   Expected Outcomes Weight Loss: Understanding of general recommendations for a balanced deficit meal plan, which promotes 1-2 lb weight loss per week and includes a negative energy balance of [phone] kcal/d   Lipids Yes   Intervention Provide education and support for participant on nutrition & aerobic/resistive exercise along with prescribed medications to achieve LDL <65m, HDL >433m   Expected Outcomes Short Term: Participant states understanding of desired cholesterol values and is compliant with medications prescribed. Participant is following exercise prescription and nutrition guidelines.;Long Term: Cholesterol controlled with medications as prescribed, with individualized exercise RX and with personalized nutrition plan. Value goals: LDL < 7047mHDL > 40 mg.   Stress Yes   Intervention Offer individual and/or small group education and counseling on adjustment to heart disease, stress management and health-related lifestyle change. Teach and support self-help strategies.;Refer participants experiencing significant psychosocial distress to appropriate mental health specialists for further evaluation and treatment. When possible, include family members and significant others in education/counseling sessions.   Expected Outcomes Short Term: Participant demonstrates changes in health-related behavior, relaxation and other stress management skills, ability to obtain effective social support, and compliance with psychotropic medications if prescribed.;Long Term: Emotional wellbeing is indicated by absence of clinically significant psychosocial distress or social isolation.   Personal Goal Other Yes   Personal Goal Lose weight in mid-section. Improve  endurance. Learn more about oxygen use.   Intervention Monitor oxygen saturation during exercise to see if SaO2 is maintained above 90%. Provide counseling regarding oxygen necessity to maintain oxygen saturation. Provide exercise counseling to help with weight loss and improved endurance.   Expected Outcomes Patient will be more knowledgeable about oxygen need and necessity to maintain oxygen saturation. Lose weight as measured by waist circumference. Improved endurance as measured by 6-minute walk test distance.      Core Components/Risk Factors/Patient Goals Review:      Goals and Risk Factor Review    Row Name 11/21/16 1450 12/12/16 1040 01/08/17 1507         Core Components/Risk Factors/Patient Goals Review   Personal Goals Review Weight Management/Obesity;Lipids;Stress;Other Weight Management/Obesity;Lipids;Stress;Other Weight Management/Obesity;Lipids;Stress;Other     Review pt demonstrates eagerness to participate in CR exercise, nutrition and education.  pt feels more comfortable with oxygen therapy during exercise.   pt demonstrates eagerness to participate in CR exercise, nutrition and education.  pt feels more comfortable with oxygen therapy during exercise.   pt demonstrates eagerness to participate in CR exercise, nutrition and education.  pt feels more comfortable with oxygen therapy during exercise.       Expected Outcomes pt will participate in exercise, nutrition and lifestyle modifications to reduce overall RF.   pt will participate in exercise, nutrition and lifestyle modifications to reduce overall RF.   pt will participate in exercise, nutrition and lifestyle modifications to reduce overall RF.          Core Components/Risk Factors/Patient Goals at Discharge (Final Review):      Goals and Risk Factor Review - 01/08/17 1507      Core Components/Risk Factors/Patient Goals Review   Personal Goals Review Weight Management/Obesity;Lipids;Stress;Other   Review pt  demonstrates eagerness to participate in CR exercise, nutrition and education.  pt feels more comfortable with oxygen therapy during exercise.     Expected Outcomes pt will participate in  exercise, nutrition and lifestyle modifications to reduce overall RF.        ITP Comments:     ITP Comments    Row Name 10/23/16 3532 11/21/16 1447 12/18/16 1654 01/08/17 1506     ITP Comments Medical Director, Dr. Fransico Him Medical Director, Dr. Fransico Him 30day ITP review.  pt with good attendance and participation.  30day ITP review.  pt with good participation.  pt recent absences due to viral illness.         Comments:

## 2017-01-14 ENCOUNTER — Encounter (HOSPITAL_COMMUNITY)
Admission: RE | Admit: 2017-01-14 | Discharge: 2017-01-14 | Disposition: A | Discharge status: Home / Self Care | Payer: Medicaid Other | Source: Ambulatory Visit | Attending: Cardiology | Admitting: Cardiology

## 2017-01-14 DIAGNOSIS — Z955 Presence of coronary angioplasty implant and graft: Secondary | ICD-10-CM

## 2017-01-15 ENCOUNTER — Encounter (HOSPITAL_COMMUNITY): Payer: Medicaid Other

## 2017-01-16 ENCOUNTER — Telehealth (HOSPITAL_COMMUNITY): Payer: Self-pay | Admitting: Pharmacist

## 2017-01-16 ENCOUNTER — Encounter (HOSPITAL_COMMUNITY): Payer: Medicaid Other

## 2017-01-16 NOTE — Telephone Encounter (Signed)
If patient not able to tolerate Letairis, obtained PA approval for Opsumit through 01/16/18.   Ruta Hinds. Velva Harman, PharmD, BCPS, CPP Clinical Pharmacist Pager: [phone] Phone: [phone] 01/16/2017 4:40 PM

## 2017-01-17 ENCOUNTER — Encounter (HOSPITAL_COMMUNITY): Payer: Medicaid Other

## 2017-01-18 ENCOUNTER — Ambulatory Visit (HOSPITAL_COMMUNITY)
Admission: RE | Admit: 2017-01-18 | Discharge: 2017-01-18 | Disposition: A | Discharge status: Home / Self Care | Payer: Medicaid Other | Source: Ambulatory Visit | Attending: Cardiology | Admitting: Cardiology

## 2017-01-18 ENCOUNTER — Encounter (HOSPITAL_COMMUNITY): Payer: Self-pay | Admitting: Cardiology

## 2017-01-18 ENCOUNTER — Other Ambulatory Visit: Payer: Self-pay | Admitting: Pulmonary Disease

## 2017-01-18 VITALS — BP 118/70 | HR 77 | Wt 135.4 lb

## 2017-01-18 DIAGNOSIS — Z7902 Long term (current) use of antithrombotics/antiplatelets: Secondary | ICD-10-CM | POA: Diagnosis not present

## 2017-01-18 DIAGNOSIS — D869 Sarcoidosis, unspecified: Secondary | ICD-10-CM | POA: Insufficient documentation

## 2017-01-18 DIAGNOSIS — I272 Pulmonary hypertension, unspecified: Secondary | ICD-10-CM

## 2017-01-18 DIAGNOSIS — Z79899 Other long term (current) drug therapy: Secondary | ICD-10-CM | POA: Insufficient documentation

## 2017-01-18 DIAGNOSIS — Z9981 Dependence on supplemental oxygen: Secondary | ICD-10-CM | POA: Insufficient documentation

## 2017-01-18 DIAGNOSIS — Z87891 Personal history of nicotine dependence: Secondary | ICD-10-CM | POA: Diagnosis not present

## 2017-01-18 DIAGNOSIS — I2721 Secondary pulmonary arterial hypertension: Secondary | ICD-10-CM | POA: Diagnosis not present

## 2017-01-18 DIAGNOSIS — Z7982 Long term (current) use of aspirin: Secondary | ICD-10-CM | POA: Diagnosis not present

## 2017-01-18 DIAGNOSIS — K219 Gastro-esophageal reflux disease without esophagitis: Secondary | ICD-10-CM | POA: Diagnosis not present

## 2017-01-18 DIAGNOSIS — Z8249 Family history of ischemic heart disease and other diseases of the circulatory system: Secondary | ICD-10-CM | POA: Insufficient documentation

## 2017-01-18 DIAGNOSIS — J9611 Chronic respiratory failure with hypoxia: Secondary | ICD-10-CM | POA: Insufficient documentation

## 2017-01-18 DIAGNOSIS — I2510 Atherosclerotic heart disease of native coronary artery without angina pectoris: Secondary | ICD-10-CM | POA: Insufficient documentation

## 2017-01-18 NOTE — Progress Notes (Signed)
6 min walk completed.  Patient ambulated 1300 ft in 6 mins and maintained 93-96% O2 sats on 4 L with HR 120-130.

## 2017-01-18 NOTE — Progress Notes (Signed)
Pulmonology: Dr. Ashok Cordia Cardiology: Dr. [name]  51 yo with sarcoidosis, chronic hypoxemic respiratory failure on home oxygen, and pulmonary hypertension presents for followup of CAD and pulmonary hypertension.  She was diagnosed with ocular sarcoidosis in 1997. It seems like she did not have significant lung complications (that were recognized at least) until early 2017.  She now requires home oxygen.  CTA chest shows changes consistent with sarcoidosis, this has been confirmed by biopsy.  Echo in 8/17 had evidence for RV strain and elevated PA pressure.  [place] in 10/17 confirmed pulmonary arterial hypertension with normal right and left heart filling pressures.    She has started on macitentan followed by Adcirca.  She feels like these medications have helped her breathing.    She went to [facility] for transplant evaluation in 4/18.  As part of that evaluation, she had right and left heart cath.  Surprisingly, given age and lack of family history, she had an 80% LAD stenosis.  This was treated with DES.  RHC showed mild to moderate pulmonary hypertension.   She felt better after starting ambrisentan and Adcirca.  I next started her on selexipag.  She developed diarrhea/vomiting at 200 mcg bid and got severe leg cramps in addition when it was increased to 400 mcg bid.  She finally stopped selexipag and symptoms all resolved.   Transplant evaluation is currently on hold as she has been doing better symptomatically.   She is now using oxygen only with exertion and not at rest.   She has followed up with pulmonary, sarcoidosis is stable.  6 minute walk improved today.  She has finished cardiac rehab.  No dyspnea walking on flat ground.  No orthopnea/PND.  No chest pain.  Weight is down 3 lbs.    Labs (9/17): NA negative, ESR 112, anti-dsDNA negative, anti-CCP negative, RF < 14.  ACE 136.  Labs (10/17): K 4.1, creatinine 0.8 Labs (11/17): K 3.8, creatinine 0.91 Labs (3/18): BNP 16 Labs (5/18): K 4.6,  creatinine 0.74 Labs (7/18): creatinine 0.79 Labs (9/18): K 4.3, creatinine 0.76 Labs (10/18): LDL 62, HDL 40  6 minute walk (10/17): 305 m.  6 minute walk (1/18): 305 m 6 minute walk (3/18): 270 m 6 minute walk (6/18): 302 m 6 minute walk (9/18): 27 m  PMH: 1. GERD 2. Pulmonary hypertension: Suspect Group 5 PH related to sarcoidosis.  - CTA chest (8/17) with no PE but mediastinal/hilar LAN, cystic lung changes, subpleural consolidation, patchy ground glass.  - Echo (8/17): EF 55-60%, D-shaped interventricular septum, PASP 60 mmHg. - ANA negative, ESR 112, anti-dsDNA negative, anti-CCP negative, RF < 14.  - RHC (10/17): mean RA 3, PA 59/22 mean 36, mean PCWP 5, CI 2.92, PVR 7.4 WU.  - PFTs (10/17): Restrictive.  - RHC (4/18): mean RA 4, PA mean 32, mean PCWP 8, CI 2.9, PVR 5.2 WU.  - Did not tolerate selexipag.  3. Sarcoidosis: Diagnosed with ocular sarcoidosis in 1997. Significant pulmonary involvement noted in 8/17.  Lung biopsy positive.  - Cardiac MRI (5/18): EF 66%, normal RV size with low normal RV systolic function (EF 90%), no LGE (no evidence for cardiac sarcoidosis).  4. Chronic hypoxemic respiratory failure: Home oxygen 5. Childhood asthma. 6. CAD: LHC (4/18) with 80% LAD stenosis => DES to LAD.   Social History   Socioeconomic History  . Marital status: Married    Spouse name: None  . Number of children: None  . Years of education: None  . Highest education level:  None  Social Needs  . Financial resource strain: None  . Food insecurity - worry: None  . Food insecurity - inability: None  . Transportation needs - medical: None  . Transportation needs - non-medical: None  Occupational History  . None  Tobacco Use  . Smoking status: Former Smoker    Packs/day: 0.25    Years: 10.00    Pack years: 2.50    Types: Cigarettes    Last attempt to quit: 01/11/2011    Years since quitting: 6.0  . Smokeless tobacco: Never Used  Substance and Sexual Activity  .  Alcohol use: No  . Drug use: No  . Sexual activity: None  Other Topics Concern  . None  Social History Narrative   Patient denies any bird or mold exposure. Currently works with exposure to inhaled perfumes and chemicals. 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 Previous exposure to inhaled dust while working in [employer]. Denies any IV drug use or previous incarceration. No known tuberculosis exposure. She has never lived nor volunteered at a homeless shelter.   Family History  Problem Relation Age of Onset  . Hypertension Mother   . Hypertension Father   . Sarcoidosis Maternal Aunt   . Rheumatologic disease Neg Hx    ROS: All systems reviewed and negative except as per HPI.   Current Outpatient Medications  Medication Sig Dispense Refill  . ambrisentan (LETAIRIS) 10 MG tablet Take 1 tablet (10 mg total) by mouth daily. 30 tablet 11  . aspirin 81 MG tablet Take 1 tablet (81 mg total) by mouth daily. 30 tablet 0  . atorvastatin (LIPITOR) 40 MG tablet Take 1 tablet (40 mg total) by mouth daily. 30 tablet 3  . beclomethasone (QVAR) 80 MCG/ACT inhaler Inhale 2 puffs into the lungs 2 (two) times daily. 1 Inhaler 6  . clopidogrel (PLAVIX) 75 MG tablet Take 1 tablet (75 mg total) by mouth daily. 30 tablet 11  . esomeprazole (NEXIUM) 40 MG capsule TAKE 1 CAPSULE BY MOUTH DAILY AT NOON 30 capsule 3  . ferrous sulfate 325 (65 FE) MG tablet Take 1 tablet (325 mg total) by mouth 2 (two) times daily with a meal. 60 tablet 3  . predniSONE (DELTASONE) 2.5 MG tablet Take 1 tablet (2.5 mg total) by mouth daily with breakfast. 120 tablet 0  . ranitidine (ZANTAC) 150 MG tablet TAKE 1 TABLET (150 MG TOTAL) BY MOUTH AT BEDTIME. 30 tablet 3  . tadalafil, PAH, (ADCIRCA) 20 MG tablet Take 2 tablets (40 mg total) by mouth daily. 180 tablet 3   No current facility-administered medications for this encounter.    BP 118/70   Pulse 77   Wt 135  lb 6.4 oz (61.4 kg)   SpO2 98% Comment: 2L  BMI 24.76 kg/m  General: NAD Neck: No JVD, no thyromegaly or thyroid nodule.  Lungs: Clear to auscultation bilaterally with normal respiratory effort. CV: Nondisplaced PMI.  Heart regular S1/S2, no S3/S4, no murmur.  No peripheral edema.  No carotid bruit.  Normal pedal pulses.  Abdomen: Soft, nontender, no hepatosplenomegaly, no distention.  Skin: Intact without lesions or rashes.  Neurologic: Alert and oriented x 3.  Psych: Normal affect. Extremities: No clubbing or cyanosis.  HEENT: Normal.   Assessment/Plan: 1. Sarcoidosis: Followed by Dr. Ashok Cordia.  Ocular and lung involvement, biopsy-proven.  Cardiac MRI in 5/18 did not show evidence for cardiac sarcoidosis.  She is on prednisone and home oxygen. She has  been evaluated at [facility] for lung transplant, this is currently on hold as she has clinically improved over the last few months.   2. Pulmonary hypertension: Pulmonary arterial hypertension. Suspect Group 5 PH related to sarcoidosis. CTA chest showed no PE but did show changes of sarcoidosis.  PFTs were restrictive.  Echo showed RV strain and elevated PA pressure.  PAH was confirmed by [place] in 10/17 with PVR 7.4 WU.  Serologic workup was negative.  [place] in 4/18 showed mild to moderate residual pulmonary hypertension. 6 minute walk today is improved.  - Continue Adcirca and ambrisentan.  She feels like these meds have helped.  She was unable to tolerate selexipag due to intractable side effects.  Given improvement with other meds, will add inhaled Tyvaso to her regimen, hopefully she will tolerate this better than selexipag. - Repeat echo in 5/19.    3. CAD: Found incidentally on cath 4/18 done as part of workup for lung transplantation.  80% LAD stenosis, treated with DES to LAD.   No chest pain.  - Continue ASA and Plavix.  - Continue atorvastatin, good lipids in 10/18.   Followup in 2 months     Loralie Champagne 01/18/2017

## 2017-01-18 NOTE — Patient Instructions (Addendum)
Start Tyvaso (they will call you)  We did a 6 minute walk in clinic today  Your physician recommends that you schedule a follow-up appointment in: 2 months with Dr. [name]

## 2017-01-22 ENCOUNTER — Encounter (HOSPITAL_COMMUNITY): Payer: Medicaid Other

## 2017-01-22 ENCOUNTER — Telehealth (HOSPITAL_COMMUNITY): Payer: Self-pay

## 2017-01-22 NOTE — Telephone Encounter (Signed)
Need Pt insurance card to complete application for Tyvaso. Called Pt and left a VM, due to no answer.

## 2017-01-24 ENCOUNTER — Encounter (HOSPITAL_COMMUNITY): Payer: Medicaid Other

## 2017-01-25 ENCOUNTER — Telehealth: Payer: Self-pay | Admitting: Pulmonary Disease

## 2017-01-25 NOTE — Telephone Encounter (Signed)
lmtcb

## 2017-01-28 MED ORDER — FLUTICASONE PROPIONATE HFA 110 MCG/ACT IN AERO: 2.0000 | INHALATION_SPRAY | Freq: Two times a day (BID) | RESPIRATORY_TRACT | 6 refills | Status: DC

## 2017-01-28 NOTE — Telephone Encounter (Signed)
I called the pharmacy, we need to send in a new prescription for the redihaler but the pt has medicaid and they will only cover Flovent. Can we change to this inhaler, she has to try and fail and then maybe they will cover it.

## 2017-01-28 NOTE — Telephone Encounter (Signed)
Called pt and advised message from the provider. Pt understood and verbalized understanding. Nothing further is needed.   Rx sent in to [facility].

## 2017-01-28 NOTE — Telephone Encounter (Signed)
Ok. Switch to Flovent HFA 182mg - 2 inh BID - #1 inhaler - #6 refills.

## 2017-02-04 NOTE — Progress Notes (Signed)
Discharge Progress Report  Patient Details  Name: Erin Zavala MRN: [id number] Date of Birth: 02/01/1966 Referring Provider:     [place] from 10/23/2016 in [place]  Referring Provider  Loralie Champagne, MD.       Number of Visits: 21   Reason for Discharge:  Early Exit:  Insurance  Smoking History:  Social History   Tobacco Use  Smoking Status Former Smoker  . Packs/day: 0.25  . Years: 10.00  . Pack years: 2.50  . Types: Cigarettes  . Last attempt to quit: 01/11/2011  . Years since quitting: 6.0  Smokeless Tobacco Never Used    Diagnosis:  07/16/16 Status post coronary artery stent placement  ADL UCSD:   Initial Exercise Prescription: Initial Exercise Prescription - 10/23/16 1200      Date of Initial Exercise RX and Referring Provider   Date  10/23/16    Referring Provider  Loralie Champagne, MD.      Oxygen   Oxygen  Continuous    Liters  4L      Treadmill   MPH  2.2    Grade  1    Minutes  10    METs  2.81      Bike   Level  1    Minutes  10    METs  4.02      NuStep   Level  2    SPM  85    Minutes  10    METs  2.5      Prescription Details   Frequency (times per week)  3    Duration  Progress to 30 minutes of continuous aerobic without signs/symptoms of physical distress      Intensity   THRR 40-80% of Max Heartrate  68-136    Ratings of Perceived Exertion  11-13    Perceived Dyspnea  0-4      Progression   Progression  Continue to progress workloads to maintain intensity without signs/symptoms of physical distress.      Resistance Training   Training Prescription  Yes    Weight  2lbs    Reps  10-15       Discharge Exercise Prescription (Final Exercise Prescription Changes): Exercise Prescription Changes - 01/14/17 0816      Response to Exercise   Blood Pressure (Admit)  118/60    Blood Pressure (Exercise)  128/80    Blood Pressure (Exit)  102/68    Heart Rate (Admit)  91  bpm    Heart Rate (Exercise)  127 bpm    Heart Rate (Exit)  88 bpm    Oxygen Saturation (Admit)  100 %    Oxygen Saturation (Exercise)  93 %    Oxygen Saturation (Exit)  98 %    Rating of Perceived Exertion (Exercise)  12    Perceived Dyspnea (Exercise)  0    Symptoms  none    Duration  Continue with 30 min of aerobic exercise without signs/symptoms of physical distress.    Intensity  THRR unchanged      Progression   Progression  Continue to progress workloads to maintain intensity without signs/symptoms of physical distress.    Average METs  3.1      Resistance Training   Training Prescription  Yes    Weight  4lbs    Reps  10-15    Time  10 Minutes      Oxygen   Oxygen  Continuous  Liters  4L      Treadmill   MPH  2.3    Grade  3    Minutes  10    METs  3.71      Recumbant Bike   Level  2    Minutes  10    METs  2.5      NuStep   Level  4    SPM  90    Minutes  10    METs  3      Home Exercise Plan   Plans to continue exercise at  [employer] (comment) [employer]    Frequency  Add 2 additional days to program exercise sessions.    Initial Home Exercises Provided  11/21/16       Functional Capacity: 6 Minute Walk    Row Name 10/23/16 1158 01/11/17 1105       6 Minute Walk   Phase  Initial  Discharge    Distance  1342 feet  1627 feet    Distance % Change  -  21.24 %    Distance Feet Change  -  285 ft    Walk Time  6 minutes  6 minutes    # of Rest Breaks  0  0    MPH  2.54  3.1    METS  4.59  4.81    RPE  11  12    Perceived Dyspnea   1  1    VO2 Peak  16.06  -    Symptoms  Yes (comment)  Yes (comment)    Comments  Shortness of breath  mild SOB    Resting HR  87 bpm  91 bpm    Resting BP  116/68  120/68    Resting Oxygen Saturation   -  98 % 2L    Exercise Oxygen Saturation  during 6 min walk  -  84 % 2L was increased to 4L    Max Ex. HR  144 bpm  126 bpm    Max Ex. BP  140/62  126/60    2 Minute Post BP  118/62  120/80       Interval Oxygen   Interval Oxygen?  Yes  -    Baseline Oxygen Saturation %  98 %  98 %    Resting Liters of Oxygen  3 L  -    1 Minute Oxygen Saturation %  -  93 %    1 Minute Liters of Oxygen  -  3 L    2 Minute Oxygen Saturation %  -  84 %    2 Minute Liters of Oxygen  -  4 L    3 Minute Oxygen Saturation %  88 %  87 %    3 Minute Liters of Oxygen  4 L  4 L    4 Minute Oxygen Saturation %  -  95 %    4 Minute Liters of Oxygen  -  6 L    5 Minute Oxygen Saturation %  -  89 % self decrease in O2- was not advised, titrated pt up to 6L    5 Minute Liters of Oxygen  -  4 L    6 Minute Oxygen Saturation %  98 %  92 %    6 Minute Liters of Oxygen  4 L  6 L    2 Minute Post Oxygen Saturation %  99 %  98 %  2 Minute Post Liters of Oxygen  3 L  4 L       Psychological, QOL, Others - Outcomes: PHQ 2/9: Depression screen [facility] 2/9 01/14/2017 11/02/2016  Decreased Interest 0 0  Down, Depressed, Hopeless 0 0  PHQ - 2 Score 0 0    Quality of Life: Quality of Life - 11/02/16 1700      Quality of Life Scores   Health/Function Pre  -- pt with sarcoidosis, home oxygen demonstratesconcernsabout her health. although pt also demonstrates hopeful outlook with good coping skills.     Socioeconomic Pre  -- pt has recently received disability.  however pt interested in voc rehab. packet will be given.     GLOBAL Pre  -- overall scores reflective of pt struggles from her disabling chronic illness. pt offered emotional support and reassurance. will continue to monitor.        Personal Goals: Goals established at orientation with interventions provided to work toward goal. Personal Goals and Risk Factors at Admission - 10/23/16 1215      Core Components/Risk Factors/Patient Goals on Admission    Weight Management  Weight Loss;Yes    Intervention  Weight Management: Develop a combined nutrition and exercise program designed to reach desired caloric intake, while maintaining appropriate intake of  nutrient and fiber, sodium and fats, and appropriate energy expenditure required for the weight goal.    Expected Outcomes  Weight Loss: Understanding of general recommendations for a balanced deficit meal plan, which promotes 1-2 lb weight loss per week and includes a negative energy balance of [phone] kcal/d    Lipids  Yes    Intervention  Provide education and support for participant on nutrition & aerobic/resistive exercise along with prescribed medications to achieve LDL <51m, HDL >467m    Expected Outcomes  Short Term: Participant states understanding of desired cholesterol values and is compliant with medications prescribed. Participant is following exercise prescription and nutrition guidelines.;Long Term: Cholesterol controlled with medications as prescribed, with individualized exercise RX and with personalized nutrition plan. Value goals: LDL < 7059mHDL > 40 mg.    Stress  Yes    Intervention  Offer individual and/or small group education and counseling on adjustment to heart disease, stress management and health-related lifestyle change. Teach and support self-help strategies.;Refer participants experiencing significant psychosocial distress to appropriate mental health specialists for further evaluation and treatment. When possible, include family members and significant others in education/counseling sessions.    Expected Outcomes  Short Term: Participant demonstrates changes in health-related behavior, relaxation and other stress management skills, ability to obtain effective social support, and compliance with psychotropic medications if prescribed.;Long Term: Emotional wellbeing is indicated by absence of clinically significant psychosocial distress or social isolation.    Personal Goal Other  Yes    Personal Goal  Lose weight in mid-section. Improve endurance. Learn more about oxygen use.    Intervention  Monitor oxygen saturation during exercise to see if SaO2 is maintained above  90%. Provide counseling regarding oxygen necessity to maintain oxygen saturation. Provide exercise counseling to help with weight loss and improved endurance.    Expected Outcomes  Patient will be more knowledgeable about oxygen need and necessity to maintain oxygen saturation. Lose weight as measured by waist circumference. Improved endurance as measured by 6-minute walk test distance.        Personal Goals Discharge: Goals and Risk Factor Review    Row Name 11/21/16 1450 12/12/16 1040 01/08/17 1507 01/14/17 0957  Core Components/Risk Factors/Patient Goals Review   Personal Goals Review  Weight Management/Obesity;Lipids;Stress;Other  Weight Management/Obesity;Lipids;Stress;Other  Weight Management/Obesity;Lipids;Stress;Other  Weight Management/Obesity;Lipids;Stress;Other    Review  pt demonstrates eagerness to participate in CR exercise, nutrition and education.  pt feels more comfortable with oxygen therapy during exercise.    pt demonstrates eagerness to participate in CR exercise, nutrition and education.  pt feels more comfortable with oxygen therapy during exercise.    pt demonstrates eagerness to participate in CR exercise, nutrition and education.  pt feels more comfortable with oxygen therapy during exercise.    pt graduated from [employer].  pt demonstrates increased strength/stamina.  pt feels more comfortable with oxygen therapy during exercise.  pt interested in pulmonary maintenance program utilizing [facility] protocol to continue exercise regimen.      Expected Outcomes  pt will participate in exercise, nutrition and lifestyle modifications to reduce overall RF.    pt will participate in exercise, nutrition and lifestyle modifications to reduce overall RF.    pt will participate in exercise, nutrition and lifestyle modifications to reduce overall RF.    pt will participate in exercise, nutrition and lifestyle modifications to reduce overall RF and maintain lung capacity.         Exercise Goals and Review: Exercise Goals    Row Name 10/23/16 0911             Exercise Goals   Increase Physical Activity  Yes       Intervention  Develop an individualized exercise prescription for aerobic and resistive training based on initial evaluation findings, risk stratification, comorbidities and participant's personal goals.;Provide advice, education, support and counseling about physical activity/exercise needs.       Expected Outcomes  Achievement of increased cardiorespiratory fitness and enhanced flexibility, muscular endurance and strength shown through measurements of functional capacity and personal statement of participant.       Increase Strength and Stamina  Yes wean off oxygen and lose wt/increase tone in mid section       Intervention  Provide advice, education, support and counseling about physical activity/exercise needs.;Develop an individualized exercise prescription for aerobic and resistive training based on initial evaluation findings, risk stratification, comorbidities and participant's personal goals.       Expected Outcomes  Achievement of increased cardiorespiratory fitness and enhanced flexibility, muscular endurance and strength shown through measurements of functional capacity and personal statement of participant.          Nutrition & Weight - Outcomes: Pre Biometrics - 10/23/16 1203      Pre Biometrics   Height  _0  (1.575 m)    Weight  139 lb 8.8 oz (63.3 kg)    Waist Circumference  30.5 inches    Hip Circumference  37.5 inches    Waist to Hip Ratio  0.81 %    BMI (Calculated)  25.6    Triceps Skinfold  34 mm    % Body Fat  36.3 %    Grip Strength  32 kg    Flexibility  12 in    Single Leg Stand  12 seconds      Post Biometrics - 01/11/17 1111       Post  Biometrics   Height  _1  (1.575 m)    Weight  134 lb 11.2 oz (61.1 kg)    Waist Circumference  30.25 inches    Hip Circumference  37 inches    Waist to Hip Ratio  0.82 %  BMI (Calculated)  24.63    Triceps Skinfold  30 mm    % Body Fat  35.5 %    Grip Strength  30 kg    Flexibility  13.5 in    Single Leg Stand  30 seconds       Nutrition: Nutrition Therapy & Goals - 01/23/17 0934      Nutrition Therapy   Diet  Therapeutic Lifestyle Changes      Personal Nutrition Goals   Nutrition Goal  Pt to identify and limit food sources of saturated fat, trans fat, and sodium    Personal Goal #2  Pt to decrease fried food consumed    Personal Goal #3  Pt able to name foods that affect blood glucose       Intervention Plan   Intervention  Prescribe, educate and counsel regarding individualized specific dietary modifications aiming towards targeted core components such as weight, hypertension, lipid management, diabetes, heart failure and other comorbidities.    Expected Outcomes  Short Term Goal: Understand basic principles of dietary content, such as calories, fat, sodium, cholesterol and nutrients.;Long Term Goal: Adherence to prescribed nutrition plan.       Nutrition Discharge: Nutrition Assessments - 10/23/16 1510      MEDFICTS Scores   Pre Score  65       Education Questionnaire Score: Knowledge Questionnaire Score - 10/23/16 1205      Knowledge Questionnaire Score   Pre Score  18/24       Goals reviewed with patient; copy given to patient.

## 2017-02-13 ENCOUNTER — Other Ambulatory Visit: Payer: Self-pay | Admitting: Pulmonary Disease

## 2017-02-13 ENCOUNTER — Other Ambulatory Visit (HOSPITAL_COMMUNITY): Payer: Self-pay | Admitting: Cardiology

## 2017-02-20 ENCOUNTER — Telehealth (HOSPITAL_COMMUNITY): Payer: Self-pay | Admitting: Pharmacist

## 2017-02-20 NOTE — Telephone Encounter (Signed)
Adcirca PA approved by [place] Medicaid through 02/20/18.   Ruta Hinds. Velva Harman, PharmD, BCPS, CPP Clinical Pharmacist Pager: [phone] Phone: [phone] 02/20/2017 11:33 AM

## 2017-03-15 ENCOUNTER — Encounter (HOSPITAL_COMMUNITY): Payer: Medicaid Other | Admitting: Internal Medicine

## 2017-03-19 ENCOUNTER — Encounter: Payer: Self-pay | Admitting: Internal Medicine

## 2017-03-19 ENCOUNTER — Ambulatory Visit: Payer: Medicaid Other

## 2017-03-22 ENCOUNTER — Telehealth: Payer: Self-pay | Admitting: Adult Health

## 2017-03-22 ENCOUNTER — Encounter (HOSPITAL_COMMUNITY): Payer: Medicaid Other | Admitting: Cardiology

## 2017-03-22 MED ORDER — PREDNISONE 2.5 MG PO TABS: 2.5000 mg | ORAL_TABLET | Freq: Every day | ORAL | 0 refills | Status: DC

## 2017-03-22 NOTE — Telephone Encounter (Signed)
Can we refill Prednisone. JN pt but I do not see in his note that she was supposed to continue this. Please advise.    Sarcoidosis: Continuing Qvar 160 g inhaled twice daily and eyedrops prescribed by ophthalmologist. Repeat spirometry with DLCO at next appointment. Checking serum ESR, CRP, CBC, CMP, and ACE level today. Chronic hypoxic respiratory failure:  Continuing on oxygen as previously prescribed at 3 L/m with exertion. Repeat 6 minute walk test with oxygen titration at next appointment starting from 2 L/m Pulmonary hypertension:  Continuing on pulmonary arterial vasodilator regimen by cardiology. No changes. GERD: Patient continuing on Zantac. No changes. Health maintenance:  Status post Pneumovax August 2017. Administering Prevnar 13 vaccine today. Recommended Influenza Vaccine next month. Follow-up: Return to clinic in 6 months or sooner if needed.

## 2017-03-22 NOTE — Telephone Encounter (Signed)
Spoke with pt and advised rx sent to pharmacy. Nothing further is needed.   

## 2017-03-22 NOTE — Telephone Encounter (Signed)
Ok to renew same dosage , and same # as Dr. Ashok Cordia filled at last visit in 09/2016. Thanks so much.

## 2017-04-17 ENCOUNTER — Telehealth: Payer: Self-pay | Admitting: Adult Health

## 2017-04-17 MED ORDER — ESOMEPRAZOLE MAGNESIUM 40 MG PO CPDR: 40.0000 mg | DELAYED_RELEASE_CAPSULE | Freq: Every day | ORAL | 3 refills | Status: DC

## 2017-04-17 NOTE — Telephone Encounter (Signed)
Former [facility] patient last seen 9.20.18 for sarcoid follow up Pt is due for follow up in March 2019  Received 2 faxed refill requests from [facility] for pt's Nexium 62m QD #30 with 3 refills and Ferrous Sulfate 3251mBID w/ meal #60 with 3 refills.  Upon inspection of patient's chart pt had labs done at the 9.20.18 and found to have iron deficiency: Result Notes for Iron  Notes recorded by CoRiley KillCMA on 12/11/2016 at 1:50 PM EDT This has been sent to the patients pharmacy. ------  Notes recorded by NeJavier GlazierMD on 12/11/2016 at 12:41 PM EDT Please put in an order/prescription for Ferrous Sulfate 32549mo BID - #60 with 3 refills. I made her aware of her test results that are consistent with an iron deficiency anemia. This is likely due to her heavy menses that are also longer in duration. She still has no symptoms that would suggest a GI loss. I did warn her about the changes the iron can have on her stool. Thanks.    Would recommend patient contact her PCP for further refills on the ferrous sulfate and to follow up on the anemia Will go ahead and refill the Nexium under TP's name as JN Durene Cal no longer with the practice

## 2017-05-03 ENCOUNTER — Encounter (HOSPITAL_COMMUNITY): Payer: Self-pay

## 2017-05-03 ENCOUNTER — Encounter (HOSPITAL_COMMUNITY): Payer: Self-pay | Admitting: Cardiology

## 2017-05-03 ENCOUNTER — Ambulatory Visit (HOSPITAL_COMMUNITY)
Admission: RE | Admit: 2017-05-03 | Discharge: 2017-05-03 | Disposition: A | Discharge status: Home / Self Care | Payer: Medicaid Other | Source: Ambulatory Visit | Attending: Cardiology | Admitting: Cardiology

## 2017-05-03 VITALS — BP 129/57 | HR 82 | Wt 131.8 lb

## 2017-05-03 DIAGNOSIS — D869 Sarcoidosis, unspecified: Secondary | ICD-10-CM | POA: Insufficient documentation

## 2017-05-03 DIAGNOSIS — Z87891 Personal history of nicotine dependence: Secondary | ICD-10-CM | POA: Diagnosis not present

## 2017-05-03 DIAGNOSIS — I2510 Atherosclerotic heart disease of native coronary artery without angina pectoris: Secondary | ICD-10-CM

## 2017-05-03 DIAGNOSIS — I272 Pulmonary hypertension, unspecified: Secondary | ICD-10-CM

## 2017-05-03 DIAGNOSIS — D860 Sarcoidosis of lung: Secondary | ICD-10-CM | POA: Diagnosis not present

## 2017-05-03 DIAGNOSIS — Z7902 Long term (current) use of antithrombotics/antiplatelets: Secondary | ICD-10-CM | POA: Diagnosis not present

## 2017-05-03 DIAGNOSIS — Z79899 Other long term (current) drug therapy: Secondary | ICD-10-CM | POA: Diagnosis not present

## 2017-05-03 DIAGNOSIS — Z7982 Long term (current) use of aspirin: Secondary | ICD-10-CM | POA: Diagnosis not present

## 2017-05-03 DIAGNOSIS — I2721 Secondary pulmonary arterial hypertension: Secondary | ICD-10-CM | POA: Insufficient documentation

## 2017-05-03 NOTE — Patient Instructions (Signed)
Abbe Amsterdam D will call you about medications  Note for work given  Your physician recommends that you schedule a follow-up appointment in: 2 months with Dr. [name]

## 2017-05-05 NOTE — Progress Notes (Addendum)
Pulmonology: Dr. Ashok Cordia Cardiology: Dr. [name]  52 y.o. with sarcoidosis, chronic hypoxemic respiratory failure on home oxygen, and pulmonary hypertension presents for followup of CAD and pulmonary hypertension.  She was diagnosed with ocular sarcoidosis in 1997. It seems like she did not have significant lung complications (that were recognized at least) until early 2017.  She now requires home oxygen.  CTA chest shows changes consistent with sarcoidosis, this has been confirmed by biopsy.  Echo in 8/17 had evidence for RV strain and elevated PA pressure.  [place] in 10/17 confirmed pulmonary arterial hypertension with normal right and left heart filling pressures.    She has started on macitentan followed by Adcirca.  She feels like these medications have helped her breathing.    She went to [facility] for transplant evaluation in 4/18.  As part of that evaluation, she had right and left heart cath.  Surprisingly, given age and lack of family history, she had an 80% LAD stenosis.  This was treated with DES.  RHC showed mild to moderate pulmonary hypertension.   She felt better after starting ambrisentan and Adcirca.  I next started her on selexipag.  She developed diarrhea/vomiting at 200 mcg bid and got severe leg cramps in addition when it was increased to 400 mcg bid.  She finally stopped selexipag and symptoms all resolved.   Transplant evaluation is currently on hold as she has been doing better symptomatically.   She was switched to generic tadalafil and can feel a difference compared to Adcirca, she thinks. She is more fatigued. She is not as symptomatic as prior to starting [place] meds, but she does not more dyspnea with stairs. No lightheadedness. She is back at work at the [employer]. She is not using oxygen at rest, only with exertion.    Labs (9/17): NA negative, ESR 112, anti-dsDNA negative, anti-CCP negative, RF < 14.  ACE 136.  Labs (10/17): K 4.1, creatinine 0.8 Labs (11/17): K 3.8, creatinine  0.91 Labs (3/18): BNP 16 Labs (5/18): K 4.6, creatinine 0.74 Labs (7/18): creatinine 0.79 Labs (9/18): K 4.3, creatinine 0.76 Labs (10/18): LDL 62, HDL 40  6 minute walk (10/17): 305 m.  6 minute walk (1/18): 305 m 6 minute walk (3/18): 270 m 6 minute walk (6/18): 302 m 6 minute walk (9/18): 396 m 6 minute walk (2/19): 213 m (could have kept going but was stopped due to decreased oxygen saturation).   PMH: 1. GERD 2. Pulmonary hypertension: Suspect Group 5 PH related to sarcoidosis.  - CTA chest (8/17) with no PE but mediastinal/hilar LAN, cystic lung changes, subpleural consolidation, patchy ground glass.  - Echo (8/17): EF 55-60%, D-shaped interventricular septum, PASP 60 mmHg. - ANA negative, ESR 112, anti-dsDNA negative, anti-CCP negative, RF < 14.  - RHC (10/17): mean RA 3, PA 59/22 mean 36, mean PCWP 5, CI 2.92, PVR 7.4 WU.  - PFTs (10/17): Restrictive.  - RHC (4/18): mean RA 4, PA mean 32, mean PCWP 8, CI 2.9, PVR 5.2 WU.  - Did not tolerate selexipag.  3. Sarcoidosis: Diagnosed with ocular sarcoidosis in 1997. Significant pulmonary involvement noted in 8/17.  Lung biopsy positive.  - Cardiac MRI (5/18): EF 66%, normal RV size with low normal RV systolic function (EF 04%), no LGE (no evidence for cardiac sarcoidosis).  4. Chronic hypoxemic respiratory failure: Home oxygen 5. Childhood asthma. 6. CAD: LHC (4/18) with 80% LAD stenosis => DES to LAD.   Social History   Socioeconomic History  . Marital status:  Married    Spouse name: None  . Number of children: None  . Years of education: None  . Highest education level: None  Social Needs  . Financial resource strain: None  . Food insecurity - worry: None  . Food insecurity - inability: None  . Transportation needs - medical: None  . Transportation needs - non-medical: None  Occupational History  . None  Tobacco Use  . Smoking status: Former Smoker    Packs/day: 0.25    Years: 10.00    Pack years: 2.50    Types:  Cigarettes    Last attempt to quit: 01/11/2011    Years since quitting: 6.3  . Smokeless tobacco: Never Used  Substance and Sexual Activity  . Alcohol use: No  . Drug use: No  . Sexual activity: None  Other Topics Concern  . None  Social History Narrative   Patient denies any bird or mold exposure. Currently works with exposure to inhaled perfumes and chemicals. 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 Previous exposure to inhaled dust while working in [employer]. Denies any IV drug use or previous incarceration. No known tuberculosis exposure. She has never lived nor volunteered at a homeless shelter.   Family History  Problem Relation Age of Onset  . Hypertension Mother   . Hypertension Father   . Sarcoidosis Maternal Aunt   . Rheumatologic disease Neg Hx    ROS: All systems reviewed and negative except as per HPI.   Current Outpatient Medications  Medication Sig Dispense Refill  . ambrisentan (LETAIRIS) 10 MG tablet Take 1 tablet (10 mg total) by mouth daily. 30 tablet 11  . aspirin 81 MG tablet Take 1 tablet (81 mg total) by mouth daily. 30 tablet 0  . atorvastatin (LIPITOR) 40 MG tablet TAKE 1 TABLET BY MOUTH EVERY DAY 30 tablet 3  . beclomethasone (QVAR) 80 MCG/ACT inhaler Inhale 2 puffs into the lungs 2 (two) times daily. 1 Inhaler 6  . clopidogrel (PLAVIX) 75 MG tablet Take 1 tablet (75 mg total) by mouth daily. 30 tablet 11  . esomeprazole (NEXIUM) 40 MG capsule Take 1 capsule (40 mg total) by mouth daily. 30 capsule 3  . ferrous sulfate 325 (65 FE) MG tablet Take 1 tablet (325 mg total) by mouth 2 (two) times daily with a meal. 60 tablet 3  . fluticasone (FLOVENT HFA) 110 MCG/ACT inhaler Inhale 2 puffs 2 (two) times daily into the lungs. 1 Inhaler 6  . predniSONE (DELTASONE) 2.5 MG tablet Take 1 tablet (2.5 mg total) by mouth daily with breakfast. 120 tablet 0  . QVAR 80 MCG/ACT inhaler INHALE 2 PUFFS INTO  THE LUNGS 2 (TWO) TIMES DAILY. 8.7 g 1  . ranitidine (ZANTAC) 150 MG tablet TAKE 1 TABLET (150 MG TOTAL) BY MOUTH AT BEDTIME. 30 tablet 3  . tadalafil, PAH, (ADCIRCA) 20 MG tablet Take 2 tablets (40 mg total) by mouth daily. 180 tablet 3   No current facility-administered medications for this encounter.    BP (!) 129/57 (BP Location: Left Arm, Patient Position: Sitting, Cuff Size: Normal)   Pulse 82   Wt 131 lb 12.8 oz (59.8 kg)   SpO2 92%   BMI 24.11 kg/m  General: NAD Neck: JVP 7-8 cm, no thyromegaly or thyroid nodule.  Lungs: Clear to auscultation bilaterally with normal respiratory effort. CV: Nondisplaced PMI.  Heart regular S1/S2 with loud P2, no S3/S4, no murmur.  No peripheral edema.  No  carotid bruit.  Normal pedal pulses.  Abdomen: Soft, nontender, no hepatosplenomegaly, no distention.  Skin: Intact without lesions or rashes.  Neurologic: Alert and oriented x 3.  Psych: Normal affect. Extremities: No clubbing or cyanosis.  HEENT: Normal.   Assessment/Plan: 1. Sarcoidosis: Ocular and lung involvement, biopsy-proven.  Cardiac MRI in 5/18 did not show evidence for cardiac sarcoidosis.  She is on prednisone and home oxygen. She has been evaluated at [facility] for lung transplant, this is currently on hold as she has clinically improved on PH meds.   2. Pulmonary hypertension: Pulmonary arterial hypertension. Suspect mixed group 1 and group 5 PH. CTA chest showed no PE but did show changes of sarcoidosis.  PFTs were restrictive.  Echo showed RV strain and elevated PA pressure.  PAH was confirmed by [place] in 10/17 with PVR 7.4 WU.  Serologic workup was negative.  [place] in 4/18 showed mild to moderate residual pulmonary hypertension. She has felt like generic tadalafil has not been as effective as brand name Adcirca, and her 6 minute walk was not as good today.   - Continue ambrisentan.   - I will try to get her back on brand-name Adcirca.  - Add Tyvaso to see if she can tolerate this. Given  improvement on PH meds, hopefully this will give incremental symptomatic benefit. - Repeat echo in 5/19.    3. CAD: Found incidentally on cath 4/18 done as part of workup for lung transplantation.  80% LAD stenosis, treated with DES to LAD.   No chest pain.  - Continue ASA and Plavix.  - Continue atorvastatin, good lipids in 10/18.   Loralie Champagne 05/05/2017

## 2017-05-06 ENCOUNTER — Other Ambulatory Visit (HOSPITAL_COMMUNITY): Payer: Self-pay | Admitting: Pharmacist

## 2017-05-06 MED ORDER — ADCIRCA 20 MG PO TABS: 40.0000 mg | ORAL_TABLET | Freq: Every day | ORAL | 3 refills | Status: DC

## 2017-05-16 NOTE — Addendum Note (Signed)
Encounter addended by: Larey Dresser, MD on: 05/16/2017 12:50 PM  Actions taken: Sign clinical note

## 2017-05-24 ENCOUNTER — Telehealth (HOSPITAL_COMMUNITY): Payer: Self-pay | Admitting: Pharmacist

## 2017-05-24 NOTE — Telephone Encounter (Signed)
[facility] RN visited patient home on 05/15/17 to educate patient on Tyvaso therapy.   Ruta Hinds. Velva Harman, PharmD, BCPS, CPP Clinical Pharmacist Phone: [phone] 05/24/2017 3:05 PM

## 2017-05-28 ENCOUNTER — Encounter: Payer: Self-pay | Admitting: Emergency Medicine

## 2017-05-28 ENCOUNTER — Ambulatory Visit: Payer: Medicaid Other | Admitting: Emergency Medicine

## 2017-05-28 ENCOUNTER — Encounter: Payer: Self-pay | Admitting: *Deleted

## 2017-05-28 DIAGNOSIS — I272 Pulmonary hypertension, unspecified: Secondary | ICD-10-CM

## 2017-05-28 DIAGNOSIS — D869 Sarcoidosis, unspecified: Secondary | ICD-10-CM | POA: Diagnosis not present

## 2017-05-28 MED ORDER — ALBUTEROL SULFATE HFA 108 (90 BASE) MCG/ACT IN AERS: 2.0000 | INHALATION_SPRAY | RESPIRATORY_TRACT | 5 refills | Status: DC | PRN

## 2017-05-28 NOTE — Progress Notes (Signed)
Subjective:    Patient ID: Erin Zavala, female    DOB: [birth year], 52 y.o.   MRN: [id number]  HPI 52 year old patient has been followed in our office by Dr. Ashok Cordia for sarcoidosis with pulmonary involvement, ocular involvement.  Is also followed in cardiology clinic for associated pulmonary arterial hypertension.  She is being treated with Ralene Cork.  Plans are in place now for her to initiate Tyvaso.  She follows with Dr. [name].  She has chronic hypoxemic respiratory failure and uses 1-4 liters per minute. Keeps track of her Spo2 and stays at goal. She needs a new ophthalmologist. She is currently on 2.95m prednisone. She believes that her breathing is doing well. Her vision has some ups and downs, some conjunctivitis sx. She remains on flovent   Review of Systems  Past Medical History:  Diagnosis Date  . Asthma    as a child  . Cough   . Dyspnea   . GERD (gastroesophageal reflux disease)   . Migraines   . Oxygen dependent   . Pulmonary hypertension ([place]   . Sarcoidosis      Family History  Problem Relation Age of Onset  . Hypertension Mother   . Hypertension Father   . Sarcoidosis Maternal Aunt   . Rheumatologic disease Neg Hx      Social History   Socioeconomic History  . Marital status: Married    Spouse name: Not on file  . Number of children: Not on file  . Years of education: Not on file  . Highest education level: Not on file  Social Needs  . Financial resource strain: Not on file  . Food insecurity - worry: Not on file  . Food insecurity - inability: Not on file  . Transportation needs - medical: Not on file  . Transportation needs - non-medical: Not on file  Occupational History  . Not on file  Tobacco Use  . Smoking status: Former Smoker    Packs/day: 0.25    Years: 10.00    Pack years: 2.50    Types: Cigarettes    Last attempt to quit: 01/11/2011    Years since quitting: 6.3  . Smokeless tobacco: Never Used  Substance and Sexual Activity    . Alcohol use: No  . Drug use: No  . Sexual activity: Not on file  Other Topics Concern  . Not on file  Social History Narrative   Patient denies any bird or mold exposure. Currently works with exposure to inhaled perfumes and chemicals. 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 Previous exposure to inhaled dust while working in [employer] Denies any IV drug use or previous incarceration. No known tuberculosis exposure. She has never lived nor volunteered at a homeless shelter.     Allergies  Allergen Reactions  . No Known Allergies      Outpatient Medications Prior to Visit  Medication Sig Dispense Refill  . ADCIRCA 20 MG tablet Take 2 tablets (40 mg total) by mouth daily. 180 tablet 3  . ambrisentan (LETAIRIS) 10 MG tablet Take 1 tablet (10 mg total) by mouth daily. 30 tablet 11  . aspirin 81 MG tablet Take 1 tablet (81 mg total) by mouth daily. 30 tablet 0  . atorvastatin (LIPITOR) 40 MG tablet TAKE 1 TABLET BY MOUTH EVERY DAY 30 tablet 3  . clopidogrel (PLAVIX) 75 MG tablet Take 1 tablet (75 mg total) by mouth daily. 30 tablet 11  . esomeprazole ([place]  40 MG capsule Take 1 capsule (40 mg total) by mouth daily. 30 capsule 3  . ferrous sulfate 325 (65 FE) MG tablet Take 1 tablet (325 mg total) by mouth 2 (two) times daily with a meal. 60 tablet 3  . fluticasone (FLOVENT HFA) 110 MCG/ACT inhaler Inhale 2 puffs 2 (two) times daily into the lungs. 1 Inhaler 6  . predniSONE (DELTASONE) 2.5 MG tablet Take 1 tablet (2.5 mg total) by mouth daily with breakfast. 120 tablet 0  . ranitidine (ZANTAC) 150 MG tablet TAKE 1 TABLET (150 MG TOTAL) BY MOUTH AT BEDTIME. 30 tablet 3  . Treprostinil (TYVASO) 0.6 MG/ML SOLN Inhale 18 mcg into the lungs 4 (four) times daily.    . beclomethasone (QVAR) 80 MCG/ACT inhaler Inhale 2 puffs into the lungs 2 (two) times daily. 1 Inhaler 6  . QVAR 80 MCG/ACT inhaler INHALE 2 PUFFS INTO THE LUNGS  2 (TWO) TIMES DAILY. 8.7 g 1   No facility-administered medications prior to visit.         Objective:   Physical Exam Vitals:   05/28/17 1142  BP: 128/82  Pulse: 80  SpO2: 95%  Weight: 135 lb (61.2 kg)  Height: 5' (1.524 m)   Gen: Pleasant, well-nourished, in no distress,  normal affect  ENT: No lesions,  mouth clear,  oropharynx clear, no postnasal drip  Neck: No JVD, no stridor  Lungs: No use of accessory muscles, clear without rales or rhonchi  Cardiovascular: RRR, heart sounds normal, no murmur or gallops, no peripheral edema  Musculoskeletal: No deformities, no cyanosis or clubbing  Neuro: alert, non focal  Skin: Warm, no lesions or rash     Assessment & Plan:  Pulmonary sarcoidosis (HCC) Please continue prednisone 2.5 mg every day for now.  We will consider a change in the future depending on our testing and your clinical status. We will perform a high resolution CT scan of the chest to assess your sarcoidosis.  We will perform full Pulmonary function testing to compare with priors.  Continue Flovent 2 puffs twice a day.  Remember to rinse and gargle after using this medication. We will order albuterol for you have available to use two puffs if needed for shortness of breath.  Follow with Dr [name] in 2 months or sooner if you have any problems.  Pulmonary hypertension ([place]) Please continue Ralene Cork as you are taking them. Start Tyvaso as directed by the advanced cardiology clinic and Dr. [name] Continue oxygen at 1-4 L/min depending on her level of exertion.  SPO2 is greater than 90%. TTE and 6 minute walk testing after Tyvaso has been established.   Baltazar Apo, MD, PhD 05/28/2017, 12:26 PM [facility] [phone] or if no answer [phone]

## 2017-05-28 NOTE — Assessment & Plan Note (Signed)
Please continue prednisone 2.5 mg every day for now.  We will consider a change in the future depending on our testing and your clinical status. We will perform a high resolution CT scan of the chest to assess your sarcoidosis.  We will perform full Pulmonary function testing to compare with priors.  Continue Flovent 2 puffs twice a day.  Remember to rinse and gargle after using this medication. We will order albuterol for you have available to use two puffs if needed for shortness of breath.  Follow with Dr [name] in 2 months or sooner if you have any problems.

## 2017-05-28 NOTE — Patient Instructions (Signed)
Please continue prednisone 2.5 mg every day for now.  We will consider a change in the future depending on our testing and your clinical status. We will perform a high resolution CT scan of the chest to assess your sarcoidosis.  We will perform full Pulmonary function testing to compare with priors.  Please continue Ralene Cork as you are taking them. Start Tyvaso as directed by the advanced cardiology clinic and Dr. [name] Continue oxygen at 1-4 L/min depending on her level of exertion.  SPO2 is greater than 90%. Continue Flovent 2 puffs twice a day.  Remember to rinse and gargle after using this medication. We will order albuterol for you have available to use two puffs if needed for shortness of breath.  Follow with Dr [name] in 2 months or sooner if you have any problems.

## 2017-05-28 NOTE — Assessment & Plan Note (Addendum)
Please continue Erin Zavala as you are taking them. Start Tyvaso as directed by the advanced cardiology clinic and Dr. [name] Continue oxygen at 1-4 L/min depending on her level of exertion.  SPO2 is greater than 90%. TTE and 6 minute walk testing after Tyvaso has been established.

## 2017-05-31 ENCOUNTER — Telehealth (HOSPITAL_COMMUNITY): Payer: Self-pay | Admitting: Pharmacist

## 2017-05-31 NOTE — Telephone Encounter (Signed)
[facility] nursing visit summary for Tyvaso titration:   Visit on 05/28/17 - started on Tyvaso 3 inhalations q 4 hours while awake VS pre: 102/74, HR 64, RR 22 5 min post: 118/70, HR 72, RR 22 15 min post: 112/68, HR 76, RR 22 30 min post: 110/72, HR 76, RR 22 60 min post: 118/70, HR 80, RR 22  Next visit on 06/04/17  Lashandra Arauz K. Velva Harman, PharmD, BCPS, CPP Clinical Pharmacist Phone: [phone] 05/31/2017 11:22 AM

## 2017-06-05 ENCOUNTER — Ambulatory Visit (INDEPENDENT_AMBULATORY_CARE_PROVIDER_SITE_OTHER)
Admission: RE | Admit: 2017-06-05 | Discharge: 2017-06-05 | Disposition: A | Discharge status: Home / Self Care | Payer: Medicaid Other | Source: Ambulatory Visit | Attending: Emergency Medicine | Admitting: Emergency Medicine

## 2017-06-05 DIAGNOSIS — D869 Sarcoidosis, unspecified: Secondary | ICD-10-CM

## 2017-06-19 ENCOUNTER — Telehealth (HOSPITAL_COMMUNITY): Payer: Self-pay | Admitting: Pharmacist

## 2017-06-19 NOTE — Telephone Encounter (Signed)
[facility] RN home visit on 06/14/17:  Increased Tyvaso to 4 breaths QID. Tolerated well. Vitals post Tyvaso: BP 110/72, RR 20, HR 78. Plan to increase to 5 breaths QID at next visit on 06/21/17.   Ruta Hinds. Velva Harman, PharmD, BCPS, CPP Clinical Pharmacist Phone: [phone] 06/19/2017 3:30 PM

## 2017-06-26 ENCOUNTER — Ambulatory Visit (INDEPENDENT_AMBULATORY_CARE_PROVIDER_SITE_OTHER): Payer: Medicaid Other | Admitting: Emergency Medicine

## 2017-06-26 DIAGNOSIS — D869 Sarcoidosis, unspecified: Secondary | ICD-10-CM

## 2017-06-26 LAB — PULMONARY FUNCTION TEST
DL/VA % pred: 59 %
DL/VA: 2.53 ml/min/mmHg/L
DLCO unc % pred: 34 %
DLCO unc: 6.52 ml/min/mmHg
FEF 25-75 Post: 2.26 L/sec
FEF 25-75 Pre: 1.76 L/sec
FEF2575-%Change-Post: 28 %
FEF2575-%Pred-Post: 106 %
FEF2575-%Pred-Pre: 82 %
FEV1-%Change-Post: 7 %
FEV1-%Pred-Post: 74 %
FEV1-%Pred-Pre: 69 %
FEV1-Post: 1.46 L
FEV1-Pre: 1.35 L
FEV1FVC-%Change-Post: 4 %
FEV1FVC-%Pred-Pre: 103 %
FEV6-%Change-Post: 5 %
FEV6-%Pred-Post: 70 %
FEV6-%Pred-Pre: 66 %
FEV6-Post: 1.65 L
FEV6-Pre: 1.56 L
FEV6FVC-%Pred-Post: 103 %
FEV6FVC-%Pred-Pre: 103 %
FVC-%Change-Post: 3 %
FVC-%Pred-Post: 68 %
FVC-%Pred-Pre: 66 %
FVC-Post: 1.67 L
FVC-Pre: 1.61 L
Post FEV1/FVC ratio: 87 %
Post FEV6/FVC ratio: 100 %
Pre FEV1/FVC ratio: 84 %
Pre FEV6/FVC Ratio: 100 %
RV % pred: 78 %
RV: 1.26 L
TLC % pred: 62 %
TLC: 2.8 L

## 2017-06-26 NOTE — Progress Notes (Signed)
PFT done today. 

## 2017-06-27 ENCOUNTER — Other Ambulatory Visit (HOSPITAL_COMMUNITY): Payer: Self-pay | Admitting: Internal Medicine

## 2017-07-08 ENCOUNTER — Encounter (HOSPITAL_COMMUNITY): Payer: Self-pay | Admitting: Cardiology

## 2017-07-08 ENCOUNTER — Ambulatory Visit (HOSPITAL_COMMUNITY)
Admission: RE | Admit: 2017-07-08 | Discharge: 2017-07-08 | Disposition: A | Discharge status: Home / Self Care | Payer: Medicaid Other | Source: Ambulatory Visit | Attending: Cardiology | Admitting: Cardiology

## 2017-07-08 ENCOUNTER — Other Ambulatory Visit: Payer: Self-pay

## 2017-07-08 VITALS — BP 101/74 | HR 86 | Wt 137.0 lb

## 2017-07-08 DIAGNOSIS — J9611 Chronic respiratory failure with hypoxia: Secondary | ICD-10-CM | POA: Insufficient documentation

## 2017-07-08 DIAGNOSIS — Z7982 Long term (current) use of aspirin: Secondary | ICD-10-CM | POA: Insufficient documentation

## 2017-07-08 DIAGNOSIS — I2510 Atherosclerotic heart disease of native coronary artery without angina pectoris: Secondary | ICD-10-CM | POA: Insufficient documentation

## 2017-07-08 DIAGNOSIS — D869 Sarcoidosis, unspecified: Secondary | ICD-10-CM | POA: Insufficient documentation

## 2017-07-08 DIAGNOSIS — Z7902 Long term (current) use of antithrombotics/antiplatelets: Secondary | ICD-10-CM | POA: Insufficient documentation

## 2017-07-08 DIAGNOSIS — Z79899 Other long term (current) drug therapy: Secondary | ICD-10-CM | POA: Diagnosis not present

## 2017-07-08 DIAGNOSIS — D860 Sarcoidosis of lung: Secondary | ICD-10-CM | POA: Diagnosis not present

## 2017-07-08 DIAGNOSIS — Z7952 Long term (current) use of systemic steroids: Secondary | ICD-10-CM | POA: Insufficient documentation

## 2017-07-08 DIAGNOSIS — Z9981 Dependence on supplemental oxygen: Secondary | ICD-10-CM | POA: Diagnosis not present

## 2017-07-08 DIAGNOSIS — Z87891 Personal history of nicotine dependence: Secondary | ICD-10-CM | POA: Diagnosis not present

## 2017-07-08 DIAGNOSIS — I2721 Secondary pulmonary arterial hypertension: Secondary | ICD-10-CM | POA: Diagnosis not present

## 2017-07-08 DIAGNOSIS — I272 Pulmonary hypertension, unspecified: Secondary | ICD-10-CM

## 2017-07-08 DIAGNOSIS — Z825 Family history of asthma and other chronic lower respiratory diseases: Secondary | ICD-10-CM | POA: Diagnosis not present

## 2017-07-08 DIAGNOSIS — K219 Gastro-esophageal reflux disease without esophagitis: Secondary | ICD-10-CM | POA: Insufficient documentation

## 2017-07-08 DIAGNOSIS — Z8249 Family history of ischemic heart disease and other diseases of the circulatory system: Secondary | ICD-10-CM | POA: Diagnosis not present

## 2017-07-08 MED ORDER — CLOPIDOGREL BISULFATE 75 MG PO TABS: 75.0000 mg | ORAL_TABLET | Freq: Every day | ORAL | 0 refills | Status: AC

## 2017-07-08 NOTE — Progress Notes (Signed)
6 min walk test completed.  Pt ambulated 1300 ft (396 m).  O2 sats ranged 97-82%, pt started on 2L O2 and increased to 3L during test.  HR ranged 103-135.

## 2017-07-08 NOTE — Progress Notes (Signed)
Pulmonology: Dr. [name] Cardiology: Dr. [name]  52 y.o. with sarcoidosis, chronic hypoxemic respiratory failure on home oxygen, and pulmonary hypertension presents for followup of CAD and pulmonary hypertension.  She was diagnosed with ocular sarcoidosis in 1997. It seems like she did not have significant lung complications (that were recognized at least) until early 2017.  She now requires home oxygen.  CTA chest shows changes consistent with sarcoidosis, this has been confirmed by biopsy.  Echo in 8/17 had evidence for RV strain and elevated PA pressure.  [place] in 10/17 confirmed pulmonary arterial hypertension with normal right and left heart filling pressures.    She has started on macitentan followed by Adcirca and most recently Tyvaso.  She feels like these medications have helped her breathing.    She went to [facility] for transplant evaluation in 4/18.  As part of that evaluation, she had right and left heart cath.  Surprisingly, given age and lack of family history, she had an 80% LAD stenosis.  This was treated with DES.  RHC showed mild to moderate pulmonary hypertension.   She felt better after starting ambrisentan and Adcirca.  I next started her on selexipag.  She developed diarrhea/vomiting at 200 mcg bid and got severe leg cramps in addition when it was increased to 400 mcg bid.  She finally stopped selexipag and symptoms all resolved. She also did not tolerate generic tadalafil well and is back on brand-name Adcirca.  Most recently, Tyvaso started with no problems.   Transplant evaluation is currently on hold as she has been doing better symptomatically.   She feels like she is doing well today.  No problems walking on flat ground.  No chest pain.  No lightheadedness/syncope.  No dyspnea unless she exerts herself heavily.    Labs (9/17): NA negative, ESR 112, anti-dsDNA negative, anti-CCP negative, RF < 14.  ACE 136.  Labs (10/17): K 4.1, creatinine 0.8 Labs (11/17): K 3.8, creatinine  0.91 Labs (3/18): BNP 16 Labs (5/18): K 4.6, creatinine 0.74 Labs (7/18): creatinine 0.79 Labs (9/18): K 4.3, creatinine 0.76 Labs (10/18): LDL 62, HDL 40  6 minute walk (10/17): 305 m.  6 minute walk (1/18): 305 m 6 minute walk (3/18): 270 m 6 minute walk (6/18): 302 m 6 minute walk (9/18): 396 m 6 minute walk (2/19): 213 m (could have kept going but was stopped due to decreased oxygen saturation).  6 minute walk (4/19): 396 m  PMH: 1. GERD 2. Pulmonary hypertension: Suspect Group 5 PH related to sarcoidosis.  - CTA chest (8/17) with no PE but mediastinal/hilar LAN, cystic lung changes, subpleural consolidation, patchy ground glass.  - Echo (8/17): EF 55-60%, D-shaped interventricular septum, PASP 60 mmHg. - ANA negative, ESR 112, anti-dsDNA negative, anti-CCP negative, RF < 14.  - RHC (10/17): mean RA 3, PA 59/22 mean 36, mean PCWP 5, CI 2.92, PVR 7.4 WU.  - PFTs (10/17): Restrictive.  - RHC (4/18): mean RA 4, PA mean 32, mean PCWP 8, CI 2.9, PVR 5.2 WU.  - Did not tolerate selexipag.  3. Sarcoidosis: Diagnosed with ocular sarcoidosis in 1997. Significant pulmonary involvement noted in 8/17.  Lung biopsy positive.  - Cardiac MRI (5/18): EF 66%, normal RV size with low normal RV systolic function (EF 67%), no LGE (no evidence for cardiac sarcoidosis).  - CT chest 3/19 with stable pulmonary fibrotic changes related to sarcoidosis.  - PFTs (4/19): FVC 66%, FEV1 69%, ratio 103%, DLCO 37%, TLC 62% 4. Chronic hypoxemic respiratory failure: Home  oxygen 5. Childhood asthma. 6. CAD: LHC (4/18) with 80% LAD stenosis => DES to LAD.   Social History   Socioeconomic History  . Marital status: Married    Spouse name: Not on file  . Number of children: Not on file  . Years of education: Not on file  . Highest education level: Not on file  Occupational History  . Not on file  Social Needs  . Financial resource strain: Not on file  . Food insecurity:    Worry: Not on file     Inability: Not on file  . Transportation needs:    Medical: Not on file    Non-medical: Not on file  Tobacco Use  . Smoking status: Former Smoker    Packs/day: 0.25    Years: 10.00    Pack years: 2.50    Types: Cigarettes    Last attempt to quit: 01/11/2011    Years since quitting: 6.4  . Smokeless tobacco: Never Used  Substance and Sexual Activity  . Alcohol use: No  . Drug use: No  . Sexual activity: Not on file  Lifestyle  . Physical activity:    Days per week: Not on file    Minutes per session: Not on file  . Stress: Not on file  Relationships  . Social connections:    Talks on phone: Not on file    Gets together: Not on file    Attends religious service: Not on file    Active member of club or organization: Not on file    Attends meetings of clubs or organizations: Not on file    Relationship status: Not on file  Other Topics Concern  . Not on file  Social History Narrative   Patient denies any bird or mold exposure. Currently works with exposure to inhaled perfumes and chemicals. 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 Previous exposure to inhaled dust while working in [employer]. Denies any IV drug use or previous incarceration. No known tuberculosis exposure. She has never lived nor volunteered at a homeless shelter.   Family History  Problem Relation Age of Onset  . Hypertension Mother   . Hypertension Father   . Sarcoidosis Maternal Aunt   . Rheumatologic disease Neg Hx    ROS: All systems reviewed and negative except as per HPI.   Current Outpatient Medications  Medication Sig Dispense Refill  . ADCIRCA 20 MG tablet Take 2 tablets (40 mg total) by mouth daily. 180 tablet 3  . albuterol (PROVENTIL HFA;VENTOLIN HFA) 108 (90 Base) MCG/ACT inhaler Inhale 2 puffs into the lungs every 4 (four) hours as needed for wheezing or shortness of breath. 1 Inhaler 5  . ambrisentan (LETAIRIS) 10 MG tablet  Take 1 tablet (10 mg total) by mouth daily. 30 tablet 11  . aspirin 81 MG tablet Take 1 tablet (81 mg total) by mouth daily. 30 tablet 0  . atorvastatin (LIPITOR) 40 MG tablet TAKE 1 TABLET BY MOUTH EVERY DAY 30 tablet 11  . clopidogrel (PLAVIX) 75 MG tablet Take 1 tablet (75 mg total) by mouth daily for 1 day. 1 tablet 0  . esomeprazole (NEXIUM) 40 MG capsule Take 1 capsule (40 mg total) by mouth daily. 30 capsule 3  . ferrous sulfate 325 (65 FE) MG tablet Take 1 tablet (325 mg total) by mouth 2 (two) times daily with a meal. 60 tablet 3  . fluticasone (FLOVENT HFA) 110 MCG/ACT inhaler Inhale 2 puffs  2 (two) times daily into the lungs. 1 Inhaler 6  . predniSONE (DELTASONE) 2.5 MG tablet Take 1 tablet (2.5 mg total) by mouth daily with breakfast. 120 tablet 0  . ranitidine (ZANTAC) 150 MG tablet TAKE 1 TABLET (150 MG TOTAL) BY MOUTH AT BEDTIME. 30 tablet 3  . Treprostinil (TYVASO) 0.6 MG/ML SOLN Inhale 18 mcg into the lungs 4 (four) times daily.     No current facility-administered medications for this encounter.    BP 101/74   Pulse 86   Wt 137 lb (62.1 kg)   SpO2 100% Comment: on 2L of O2  BMI 26.76 kg/m  General: NAD Neck: No JVD, no thyromegaly or thyroid nodule.  Lungs: Slight crackles at bases bilaterally.  CV: Nondisplaced PMI.  Heart regular S1/S2, no S3/S4, no murmur.  No peripheral edema.  No carotid bruit.  Normal pedal pulses.  Abdomen: Soft, nontender, no hepatosplenomegaly, no distention.  Skin: Intact without lesions or rashes.  Neurologic: Alert and oriented x 3.  Psych: Normal affect. Extremities: No clubbing or cyanosis.  HEENT: Normal.   Assessment/Plan: 1. Sarcoidosis: Ocular and lung involvement, biopsy-proven.  Cardiac MRI in 5/18 did not show evidence for cardiac sarcoidosis.  She is on prednisone and home oxygen. She has been evaluated at [facility] for lung transplant, this is currently on hold as she has clinically improved on PH meds.   - Follows with Dr. [name].   2. Pulmonary hypertension: Pulmonary arterial hypertension. Suspect mixed group 1 and group 5 PH. CTA chest showed no PE but did show changes of sarcoidosis.  PFTs were restrictive.  Echo showed RV strain and elevated PA pressure.  PAH was confirmed by [place] in 10/17 with PVR 7.4 WU.  Serologic workup was negative.  [place] in 4/18 showed mild to moderate residual pulmonary hypertension. She has felt like generic tadalafil has not been as effective as brand name Adcirca. 6 minute walk today is improved and she feels well.   - Continue ambrisentan.   - Continue brand-name Adcirca 40 mg daily.  - Continue Tyvaso. - Repeat echo in 5/19.    3. CAD: Found incidentally on cath 4/18 done as part of workup for lung transplantation.  80% LAD stenosis, treated with DES to LAD.   No chest pain.  - Continue ASA and Plavix. She can stop Plavix at the end of April (1 year post-PCI).  - Continue atorvastatin, good lipids in 10/18.   Followup in 3 months.   Loralie Champagne 07/08/2017

## 2017-07-08 NOTE — Patient Instructions (Signed)
Stop Plavix after tomorrows dose  You did great on your 6 minute walk today, 1300 feet!!!  Your physician has requested that you have an echocardiogram. Echocardiography is a painless test that uses sound waves to create images of your heart. It provides your doctor with information about the size and shape of your heart and how well your heart's chambers and valves are working. This procedure takes approximately one hour. There are no restrictions for this procedure.  IN MAY  Your physician recommends that you schedule a follow-up appointment in: 3 months

## 2017-07-18 ENCOUNTER — Other Ambulatory Visit: Payer: Self-pay | Admitting: Acute Care

## 2017-07-23 ENCOUNTER — Ambulatory Visit (HOSPITAL_COMMUNITY)
Admission: RE | Admit: 2017-07-23 | Discharge: 2017-07-23 | Disposition: A | Discharge status: Home / Self Care | Payer: Medicaid Other | Source: Ambulatory Visit | Attending: Cardiology | Admitting: Cardiology

## 2017-07-23 ENCOUNTER — Encounter (HOSPITAL_COMMUNITY): Payer: Self-pay | Admitting: Internal Medicine

## 2017-07-23 DIAGNOSIS — I081 Rheumatic disorders of both mitral and tricuspid valves: Secondary | ICD-10-CM | POA: Diagnosis not present

## 2017-07-23 DIAGNOSIS — I272 Pulmonary hypertension, unspecified: Secondary | ICD-10-CM | POA: Diagnosis not present

## 2017-07-23 DIAGNOSIS — I2510 Atherosclerotic heart disease of native coronary artery without angina pectoris: Secondary | ICD-10-CM | POA: Insufficient documentation

## 2017-07-23 DIAGNOSIS — D860 Sarcoidosis of lung: Secondary | ICD-10-CM | POA: Diagnosis not present

## 2017-07-23 NOTE — Progress Notes (Signed)
  Echocardiogram 2D Echocardiogram has been performed.  Bobbye Charleston 07/23/2017, 10:03 AM

## 2017-08-06 ENCOUNTER — Other Ambulatory Visit: Payer: Self-pay | Admitting: *Deleted

## 2017-08-06 MED ORDER — FERROUS SULFATE 325 (65 FE) MG PO TABS: 325.0000 mg | ORAL_TABLET | Freq: Two times a day (BID) | ORAL | 2 refills | Status: DC

## 2017-08-19 ENCOUNTER — Encounter: Payer: Self-pay | Admitting: Adult Health

## 2017-08-19 ENCOUNTER — Other Ambulatory Visit: Payer: Self-pay | Admitting: Adult Health

## 2017-08-19 ENCOUNTER — Ambulatory Visit: Payer: Medicaid Other | Admitting: Adult Health

## 2017-08-19 DIAGNOSIS — J209 Acute bronchitis, unspecified: Secondary | ICD-10-CM

## 2017-08-19 DIAGNOSIS — J9611 Chronic respiratory failure with hypoxia: Secondary | ICD-10-CM | POA: Insufficient documentation

## 2017-08-19 DIAGNOSIS — D860 Sarcoidosis of lung: Secondary | ICD-10-CM | POA: Diagnosis not present

## 2017-08-19 MED ORDER — PREDNISONE 2.5 MG PO TABS: 2.5000 mg | ORAL_TABLET | Freq: Every day | ORAL | 1 refills | Status: DC

## 2017-08-19 MED ORDER — BENZONATATE 200 MG PO CAPS: 200.0000 mg | ORAL_CAPSULE | Freq: Three times a day (TID) | ORAL | 1 refills | Status: DC | PRN

## 2017-08-19 MED ORDER — LEVALBUTEROL HCL 0.63 MG/3ML IN NEBU
0.6300 mg | INHALATION_SOLUTION | Freq: Once | RESPIRATORY_TRACT | Status: AC
  Administered 2017-08-19: 0.63 mg via RESPIRATORY_TRACT

## 2017-08-19 MED ORDER — AMOXICILLIN-POT CLAVULANATE 875-125 MG PO TABS: 1.0000 | ORAL_TABLET | Freq: Two times a day (BID) | ORAL | 0 refills | Status: AC

## 2017-08-19 NOTE — Assessment & Plan Note (Signed)
Cont on O2 .  

## 2017-08-19 NOTE — Assessment & Plan Note (Signed)
Acute bronchitis with underlying sarcoidosis.  She has a complicated medical history with underlying pulmonary hypertension and chronic respiratory failure on oxygen.  No increased oxygen demands.  Plan  Patient Instructions  Begin Augmentin twice daily for 1 week, take with food Mucinex DM twice daily as needed for cough and congestion Tessalon Perles 3 times daily as needed for cough Saline nasal rinses as needed Saline nasal gel at bedtime as needed Increase prednisone to 20 mg daily for 5 days then 10 mg daily for 5 days then 5 mg daily for 5 days then back to 2.5 mg daily Continue on Flovent twice daily Continue  on oxygen 2 L at rest and 3 L with activity Follow-up with Erin Zavala in 3 to 4 weeks and as needed Please contact office for sooner follow up if symptoms do not improve or worsen or seek emergency care

## 2017-08-19 NOTE — Progress Notes (Signed)
_0  ID: Erin Zavala, female    DOB: 09/01/1965, 52 y.o.   MRN: [id number]  Chief Complaint  Patient presents with  . Acute Visit    cough    Referring provider: [name], MD  HPI: 52 year old female former smoker followed for sarcoidosis with pulmonary and ocular involvement and chronic hypoxic respiratory failure on oxygen Ocular sarcoid diagnosed in 1997.  Pulmonary sarcoidosis diagnosed in August 2017. Cardiac MRI May 2018 no evidence of cardiac sarcoidosis Past medical history significant for pulmonary hypertension followed by cardiology on Adcirca, Letairis and Tyvaso   TEST  High-resolution CT chest March 2019 showed mediastinal hilar and pulmonary parenchymal fibrotic changes of sarcoid  2D echo May 2019 showed EF 60 to 65%, pulmonary artery pressure 37 mmHg (significant improvement from previous pulmonary pressure at 60 mmHg)  PFT April 2019 showed FEV1 74%, ratio 87, FVC 68%, no significant bronchodilator response.  Positive mid flow reversibility.  DLCO 34%.  [facility] evaluation for transplant eval , right and left heart cath.  Showed 80% LAD stenosis.  She was treated with DES.  Right heart cath showed mild to moderate pulmonary hypertension.  08/19/2017 Acute OV : Cough  Patient presents for an acute office visit.  Patient complains of 4 days of increased cough congestion with clear to yellow mucus sneezing, sinus drainage, wheezing and shortness of breath.  Patient has underlying sarcoidosis.  She is on chronic steroids prednisone 2.5 mg.  She increased her prednisone to 5 mg daily.. She remains on Flovent twice daily. She denies any hemoptysis, chest pain, orthopnea, edema. Patient is on oxygen 2 L at rest and 3 L with activity.  O2 saturation on arrival today were 95% on 2 L.  Appetite is fair with no nausea, vomiting, diarrhea.. Coughed so hard threw up some thick mucus.     Allergies  Allergen Reactions  . No Known Allergies     Immunization History    Administered Date(s) Administered  . Influenza,inj,Quad PF,6+ Mos 02/28/2016, 12/18/2016  . PPD Test 02/28/2016  . Pneumococcal Conjugate-13 11/29/2016  . Pneumococcal Polysaccharide-23 11/03/2015    Past Medical History:  Diagnosis Date  . Asthma    as a child  . Cough   . Dyspnea   . GERD (gastroesophageal reflux disease)   . Migraines   . Oxygen dependent   . Pulmonary hypertension ([place])   . Sarcoidosis     Tobacco History: Social History   Tobacco Use  Smoking Status Former Smoker  . Packs/day: 0.25  . Years: 10.00  . Pack years: 2.50  . Types: Cigarettes  . Last attempt to quit: 01/11/2011  . Years since quitting: 6.6  Smokeless Tobacco Never Used   Counseling given: Not Answered   Outpatient Encounter Medications as of 08/19/2017  Medication Sig  . ADCIRCA 20 MG tablet Take 2 tablets (40 mg total) by mouth daily.  [name] albuterol (PROVENTIL HFA;VENTOLIN HFA) 108 (90 Base) MCG/ACT inhaler Inhale 2 puffs into the lungs every 4 (four) hours as needed for wheezing or shortness of breath.  [name] ambrisentan (LETAIRIS) 10 MG tablet Take 1 tablet (10 mg total) by mouth daily.  [name] aspirin 81 MG tablet Take 1 tablet (81 mg total) by mouth daily.  [name] atorvastatin (LIPITOR) 40 MG tablet TAKE 1 TABLET BY MOUTH EVERY DAY  . esomeprazole (NEXIUM) 40 MG capsule TAKE 1 CAPSULE BY MOUTH EVERY DAY  . ferrous sulfate 325 (65 FE) MG tablet Take 1 tablet (325 mg total) by mouth 2 (two)  times daily with a meal.  . fluticasone (FLOVENT HFA) 110 MCG/ACT inhaler Inhale 2 puffs 2 (two) times daily into the lungs.  . predniSONE (DELTASONE) 2.5 MG tablet TAKE 1 TABLET (2.5 MG TOTAL) BY MOUTH DAILY WITH BREAKFAST.  . ranitidine (ZANTAC) 150 MG tablet TAKE 1 TABLET (150 MG TOTAL) BY MOUTH AT BEDTIME.  . Treprostinil (TYVASO) 0.6 MG/ML SOLN Inhale 18 mcg into the lungs 4 (four) times daily.  [name] amoxicillin-clavulanate (AUGMENTIN) 875-125 MG tablet Take 1 tablet by mouth 2 (two) times daily for 7 days.   . benzonatate (TESSALON) 200 MG capsule Take 1 capsule (200 mg total) by mouth 3 (three) times daily as needed for cough.  . [DISCONTINUED] esomeprazole (NEXIUM) 40 MG capsule Take 1 capsule (40 mg total) by mouth daily.   No facility-administered encounter medications on file as of 08/19/2017.      Review of Systems  Constitutional:   No  weight loss, night sweats,  Fevers, chills, fatigue, or  lassitude.  HEENT:   No headaches,  Difficulty swallowing,  Tooth/dental problems, or  Sore throat,                No sneezing, itching, ear ache, nasal congestion, post nasal drip,   CV:  No chest pain,  Orthopnea, PND, swelling in lower extremities, anasarca, dizziness, palpitations, syncope.   GI  No heartburn, indigestion, abdominal pain, nausea, vomiting, diarrhea, change in bowel habits, loss of appetite, bloody stools.   Resp: No shortness of breath with exertion or at rest.  No excess mucus, no productive cough,  No non-productive cough,  No coughing up of blood.  No change in color of mucus.  No wheezing.  No chest wall deformity  Skin: no rash or lesions.  GU: no dysuria, change in color of urine, no urgency or frequency.  No flank pain, no hematuria   MS:  No joint pain or swelling.  No decreased range of motion.  No back pain.    Physical Exam  BP (!) 106/58 (BP Location: Left Arm, Cuff Size: Normal)   Pulse (!) 102   Temp 98.2 F (36.8 C) (Oral)   Ht 5' (1.524 m)   Wt 136 lb (61.7 kg)   SpO2 95%   BMI 26.56 kg/m   GEN: A/Ox3; pleasant , NAD, chronically ill appearing on O2.    HEENT:  [place]/AT,  EACs-clear, TMs-wnl, NOSE-clear, THROAT-clear, no lesions, no postnasal drip or exudate noted.   NECK:  Supple w/ fair ROM; no JVD; normal carotid impulses w/o bruits; no thyromegaly or nodules palpated; no lymphadenopathy.    RESP few scattered rhonchi   no accessory muscle use, no dullness to percussion  CARD:  RRR, no m/r/g, no peripheral edema, pulses intact, no cyanosis  or clubbing.  GI:   Soft & nt; nml bowel sounds; no organomegaly or masses detected.   Musco: Warm bil, no deformities or joint swelling noted.   Neuro: alert, no focal deficits noted.    Skin: Warm, no lesions or rashes    Lab Results:  CBC  BMET   ProBNP No results found for: PROBNP  Imaging: No results found.   Assessment & Plan:   Acute bronchitis Acute bronchitis with underlying sarcoidosis.  She has a complicated medical history with underlying pulmonary hypertension and chronic respiratory failure on oxygen.  No increased oxygen demands.  Plan  Patient Instructions  Begin Augmentin twice daily for 1 week, take with food Mucinex DM twice daily as needed for  cough and congestion Tessalon Perles 3 times daily as needed for cough Saline nasal rinses as needed Saline nasal gel at bedtime as needed Increase prednisone to 20 mg daily for 5 days then 10 mg daily for 5 days then 5 mg daily for 5 days then back to 2.5 mg daily Continue on Flovent twice daily Continue  on oxygen 2 L at rest and 3 L with activity Follow-up with Dr. [name] in 3 to 4 weeks and as needed Please contact office for sooner follow up if symptoms do not improve or worsen or seek emergency care         Chronic respiratory failure with hypoxia ([place]) Cont on O2 .   Pulmonary sarcoidosis ([place]) Flare with Bronchitis   Plan  Patient Instructions  Begin Augmentin twice daily for 1 week, take with food Mucinex DM twice daily as needed for cough and congestion Tessalon Perles 3 times daily as needed for cough Saline nasal rinses as needed Saline nasal gel at bedtime as needed Increase prednisone to 20 mg daily for 5 days then 10 mg daily for 5 days then 5 mg daily for 5 days then back to 2.5 mg daily Continue on Flovent twice daily Continue  on oxygen 2 L at rest and 3 L with activity Follow-up with Dr. [name] in 3 to 4 weeks and as needed Please contact office for sooner follow up if  symptoms do not improve or worsen or seek emergency care            Rexene Edison, NP 08/19/2017

## 2017-08-19 NOTE — Addendum Note (Signed)
Addended by: Parke Poisson E on: 08/19/2017 05:25 PM   Modules accepted: Orders

## 2017-08-19 NOTE — Assessment & Plan Note (Signed)
Flare with Bronchitis   Plan  Patient Instructions  Begin Augmentin twice daily for 1 week, take with food Mucinex DM twice daily as needed for cough and congestion Tessalon Perles 3 times daily as needed for cough Saline nasal rinses as needed Saline nasal gel at bedtime as needed Increase prednisone to 20 mg daily for 5 days then 10 mg daily for 5 days then 5 mg daily for 5 days then back to 2.5 mg daily Continue on Flovent twice daily Continue  on oxygen 2 L at rest and 3 L with activity Follow-up with Dr. [name] in 3 to 4 weeks and as needed Please contact office for sooner follow up if symptoms do not improve or worsen or seek emergency care

## 2017-08-19 NOTE — Patient Instructions (Signed)
Begin Augmentin twice daily for 1 week, take with food Mucinex DM twice daily as needed for cough and congestion Tessalon Perles 3 times daily as needed for cough Saline nasal rinses as needed Saline nasal gel at bedtime as needed Increase prednisone to 20 mg daily for 5 days then 10 mg daily for 5 days then 5 mg daily for 5 days then back to 2.5 mg daily Continue on Flovent twice daily Continue  on oxygen 2 L at rest and 3 L with activity Follow-up with Dr. [name] in 3 to 4 weeks and as needed Please contact office for sooner follow up if symptoms do not improve or worsen or seek emergency care

## 2017-08-28 ENCOUNTER — Ambulatory Visit (INDEPENDENT_AMBULATORY_CARE_PROVIDER_SITE_OTHER)
Admission: RE | Admit: 2017-08-28 | Discharge: 2017-08-28 | Disposition: A | Discharge status: Home / Self Care | Payer: Medicaid Other | Source: Ambulatory Visit | Attending: Acute Care | Admitting: Acute Care

## 2017-08-28 ENCOUNTER — Ambulatory Visit (INDEPENDENT_AMBULATORY_CARE_PROVIDER_SITE_OTHER): Payer: Medicaid Other | Admitting: Acute Care

## 2017-08-28 ENCOUNTER — Encounter: Payer: Self-pay | Admitting: Acute Care

## 2017-08-28 VITALS — BP 100/52 | HR 136 | Ht 60.0 in | Wt 136.0 lb

## 2017-08-28 DIAGNOSIS — D860 Sarcoidosis of lung: Secondary | ICD-10-CM

## 2017-08-28 DIAGNOSIS — R0602 Shortness of breath: Secondary | ICD-10-CM | POA: Diagnosis not present

## 2017-08-28 DIAGNOSIS — I272 Pulmonary hypertension, unspecified: Secondary | ICD-10-CM

## 2017-08-28 DIAGNOSIS — J209 Acute bronchitis, unspecified: Secondary | ICD-10-CM

## 2017-08-28 LAB — NITRIC OXIDE

## 2017-08-28 MED ORDER — FAMOTIDINE 40 MG PO TABS: 40.0000 mg | ORAL_TABLET | Freq: Every day | ORAL | 2 refills | Status: DC

## 2017-08-28 MED ORDER — HYDROCODONE-HOMATROPINE 5-1.5 MG/5ML PO SYRP: 5.0000 mL | ORAL_SOLUTION | Freq: Four times a day (QID) | ORAL | 0 refills | Status: DC | PRN

## 2017-08-28 MED ORDER — PREDNISONE 10 MG PO TABS: ORAL_TABLET | ORAL | 0 refills | Status: DC

## 2017-08-28 MED ORDER — LEVALBUTEROL HCL 0.63 MG/3ML IN NEBU
0.6300 mg | INHALATION_SOLUTION | Freq: Once | RESPIRATORY_TRACT | Status: AC
  Administered 2017-08-28: 0.63 mg via RESPIRATORY_TRACT

## 2017-08-28 MED ORDER — ALBUTEROL SULFATE HFA 108 (90 BASE) MCG/ACT IN AERS: 2.0000 | INHALATION_SPRAY | RESPIRATORY_TRACT | 5 refills | Status: DC | PRN

## 2017-08-28 NOTE — Progress Notes (Signed)
There was not a Feno done on this pt. This was an error. TA/CMA

## 2017-08-28 NOTE — Assessment & Plan Note (Addendum)
  Please continue Erin Zavala as you are taking them. Start Tyvaso as directed by the advanced cardiology clinic and Dr. [name] Continue oxygen at 1-4 L/min depending on her level of exertion.  SPO2 is greater than 90%. TTE and 6 minute walk testing after Tyvaso has been established.

## 2017-08-28 NOTE — Progress Notes (Signed)
History of Present Illness Erin Zavala is a 52 y.o. female with pulmonary sarcoidosis and pulmonary HTN. She is followed by Dr. [name]. HPI 52 year old patient has been followed in our office by Dr. Ashok Cordia for sarcoidosis with pulmonary involvement, ocular involvement.  Is also followed in cardiology clinic for associated pulmonary arterial hypertension.  She is being treated with Ralene Cork and Tyvasco.  She follows with Dr. [name].  She has chronic hypoxemic respiratory failure and uses 1-4 liters per minute. Keeps track of her Spo2 and stays at goal. She needs a new ophthalmologist. She is currently on 2.64m prednisone.  Maintenance ARalene Cork Tyvasco   08/28/2017  Acute OV: Pt. Presents for an acute OV. She states she has been coughing and has noticed blood in her mucus. She states she has been compliant with her ARalene Cork and Tyvasco . She was seen by TRexene EdisonNP 08/19/2017 for cough and treated with prednisone taper, Augmentin, Mucinex DM, and Tessalon Perles. She states she did not have enough prednisone to take the taper.She did increase her dose to 7.5 mg per day x 10 days. She states she did initially feel better, but then got sick again.She states her secretions have become thicker and " chunkier". She states the secretions are yellow in color.She states she completed the Augmentin Tammy prescribed.She denies fever, chest pain, orthopnea or hemoptysis.She states she does have some shortness of breath and wheezing with the cough. No further blood in sputum. She states cough is worse at bedtime.She is compliant with her Nexium.     Test Results: CXR 08/28/2017 IMPRESSION: Stable Sarcoid related chronic lung disease. No superimposed acute findings are identified.  PFT's Results for Erin Zavala, Erin Zavala(MRN [id number]   Ref. Range 12/27/2015 10:46 08/23/2016 11:58 11/28/2016 11:00 06/26/2017 10:01  FVC-Pre Latest Units: L 1.53 1.70 1.69 1.61  FVC-%Pred-Pre Latest  Units: % 61 69 68 66  FEV1-Pre Latest Units: L 1.21 1.49 1.46 1.35  FEV1-%Pred-Pre Latest Units: % 60 75 74 69  Pre FEV1/FVC ratio Latest Units: % 79 87 87 84  FEV1FVC-%Pred-Pre Latest Units: % 96 107 106 103  FEF 25-75 Pre Latest Units: L/sec 1.03 1.99 1.85 1.76  FEF2575-%Pred-Pre Latest Units: % 46 92 86 82  FEV6-Pre Latest Units: L 1.53 1.70 1.69 1.56  FEV6-%Pred-Pre Latest Units: % 63 71 71 66  Pre FEV6/FVC Ratio Latest Units: % 100 100 100 100  FEV6FVC-%Pred-Pre Latest Units: % 103 102 103 103   Results for Erin Zavala, Erin Zavala(MRN [id number] as of 08/28/2017 19:13  Ref. Range 08/23/2016 11:58 11/28/2016 11:00 06/26/2017 10:01  TLC Latest Units: L   2.80  TLC % pred Latest Units: %   62  RV Latest Units: L   1.26  RV % pred Latest Units: %   78  DLCO cor Latest Units: ml/min/mmHg 5.70 5.23   DLCO cor % pred Latest Units: % 30 27   DLCO unc Latest Units: ml/min/mmHg 5.96 5.26 6.52  DLCO unc % pred Latest Units: % 31 27 34  DL/VA Latest Units: ml/min/mmHg/L 2.30 2.17 2.53  DL/VA % pred Latest Units: % 54 51 59   HRCT 06/05/2017>> IMPRESSION: 1. Mediastinal, hilar and pulmonary parenchymal fibrotic changes of sarcoid, stable. 2. Aortic atherosclerosis (ICD10-170.0). Coronary artery calcification. 3. Pulmonary arterial enlargement, indicative of pulmonary arterial Hypertension.  High-resolution CT chest March 2019 showed mediastinal hilar and pulmonary parenchymal fibrotic changes of sarcoid  2D echo May 2019 showed EF 60 to 65%, pulmonary artery pressure 37 mmHg (significant  improvement from previous pulmonary pressure at 60 mmHg)  PFT April 2019 showed FEV1 74%, ratio 87, FVC 68%, no significant bronchodilator response.  Positive mid flow reversibility.  DLCO 34%.  [facility] evaluation for transplant eval , right and left heart cath.  Showed 80% LAD stenosis.  She was treated with DES.  Right heart cath showed mild to moderate pulmonary hypertension.   CBC Latest Ref Rng & Units  12/05/2016 11/29/2016 09/26/2016  WBC 4.0 - 10.5 K/uL 7.7 8.6 10.9(H)  Hemoglobin 12.0 - 15.0 g/dL 8.6 Repeated and verified X2.(L) 8.8 Repeated and verified X2.(L) 10.5(L)  Hematocrit 36.0 - 46.0 % 28.4(L) 29.3(L) 33.5(L)  Platelets 150.0 - 400.0 K/uL 419.0(H) 466.0(H) 403.0(H)    BMP Latest Ref Rng & Units 11/29/2016 09/26/2016 07/23/2016  Glucose 70 - 99 mg/dL 99 98 103(H)  BUN 6 - 23 mg/dL _0 Creatinine 0.40 - 1.20 mg/dL 0.76 0.79 0.74  Sodium 135 - 145 mEq/L 138 136 138  Potassium 3.5 - 5.1 mEq/L 4.3 4.3 4.6  Chloride 96 - 112 mEq/L 106 101 104  CO2 19 - 32 mEq/L _1 Calcium 8.4 - 10.5 mg/dL 9.6 10.0 9.2    BNP    Component Value Date/Time   BNP 16.5 05/18/2016 1109    ProBNP No results found for: PROBNP  PFT    Component Value Date/Time   FEV1PRE 1.35 06/26/2017 1001   FEV1POST 1.46 06/26/2017 1001   FVCPRE 1.61 06/26/2017 1001   FVCPOST 1.67 06/26/2017 1001   TLC 2.80 06/26/2017 1001   DLCOUNC 6.52 06/26/2017 1001   PREFEV1FVCRT 84 06/26/2017 1001   PSTFEV1FVCRT 87 06/26/2017 1001    Dg Chest 2 View  Result Date: 08/28/2017 CLINICAL DATA:  52 year old female with cough and shortness of breath for 10 days. Sarcoidosis. EXAM: CHEST - 2 VIEW COMPARISON:  High-resolution chest CT 06/05/2017. Chest radiographs 05/03/2016. FINDINGS: Diffuse coarse bilateral pulmonary interstitial opacity appears stable since February. Stable lung volumes. No pneumothorax, pleural effusion or acute pulmonary opacity. Stable mediastinal contours. No cardiomegaly. Visualized tracheal air column is within normal limits. No acute osseous abnormality identified. Negative visible bowel gas pattern. IMPRESSION: Stable Sarcoid related chronic lung disease. No superimposed acute findings are identified. Electronically Signed   By: Genevie Ann M.D.   On: 08/28/2017 16:00     Past medical hx Past Medical History:  Diagnosis Date  . Asthma    as a child  . Cough   . Dyspnea   . GERD  (gastroesophageal reflux disease)   . Migraines   . Oxygen dependent   . Pulmonary hypertension ([place])   . Sarcoidosis      Social History   Tobacco Use  . Smoking status: Former Smoker    Packs/day: 0.25    Years: 10.00    Pack years: 2.50    Types: Cigarettes    Last attempt to quit: 01/11/2011    Years since quitting: 6.6  . Smokeless tobacco: Never Used  Substance Use Topics  . Alcohol use: No  . Drug use: No    Ms.Kugler reports that she quit smoking about 6 years ago. Her smoking use included cigarettes. She has a 2.50 pack-year smoking history. She has never used smokeless tobacco. She reports that she does not drink alcohol or use drugs.  Tobacco Cessation: Counseling given: Yes   Past surgical hx, Family hx, Social hx all reviewed.  Current Outpatient Medications on File Prior to Visit  Medication Sig  . ADCIRCA  20 MG tablet Take 2 tablets (40 mg total) by mouth daily.  [name] ambrisentan (LETAIRIS) 10 MG tablet Take 1 tablet (10 mg total) by mouth daily.  [name] aspirin 81 MG tablet Take 1 tablet (81 mg total) by mouth daily.  [name] atorvastatin (LIPITOR) 40 MG tablet TAKE 1 TABLET BY MOUTH EVERY DAY  . benzonatate (TESSALON) 200 MG capsule Take 1 capsule (200 mg total) by mouth 3 (three) times daily as needed for cough.  . esomeprazole (NEXIUM) 40 MG capsule TAKE 1 CAPSULE BY MOUTH EVERY DAY  . ferrous sulfate 325 (65 FE) MG tablet Take 1 tablet (325 mg total) by mouth 2 (two) times daily with a meal.  . fluticasone (FLOVENT HFA) 110 MCG/ACT inhaler Inhale 2 puffs 2 (two) times daily into the lungs.  . predniSONE (DELTASONE) 2.5 MG tablet Take 1 tablet (2.5 mg total) by mouth daily with breakfast.  . ranitidine (ZANTAC) 150 MG tablet TAKE 1 TABLET (150 MG TOTAL) BY MOUTH AT BEDTIME.  . Treprostinil (TYVASO) 0.6 MG/ML SOLN Inhale 18 mcg into the lungs 4 (four) times daily.   No current facility-administered medications on file prior to visit.      Allergies  Allergen  Reactions  . No Known Allergies     Review Of Systems:  Constitutional:   No  weight loss, night sweats,  Fevers, chills, +fatigue, or  lassitude.  HEENT:   No headaches,  Difficulty swallowing,  Tooth/dental problems, or  Sore throat,                No sneezing, itching, ear ache, +nasal congestion, +post nasal drip,   CV:  No chest pain,  Orthopnea, PND, swelling in lower extremities, anasarca, dizziness, palpitations, syncope.   GI  No heartburn, indigestion, abdominal pain, nausea, vomiting, diarrhea, change in bowel habits, loss of appetite, bloody stools.   Resp: + Baseline shortness of breath with exertion or at rest.  + excess mucus, + productive cough,  + non-productive cough,  No coughing up of blood.  No change in color of mucus.  + wheezing.  No chest wall deformity  Skin: no rash or lesions.  GU: no dysuria, change in color of urine, no urgency or frequency.  No flank pain, no hematuria   MS:  No joint pain or swelling.  No decreased range of motion.  No back pain.  Psych:  No change in mood or affect. No depression or anxiety.  No memory loss.   Vital Signs BP (!) 100/52 (BP Location: Left Arm, Cuff Size: Normal)   Pulse (!) 136   Ht 5' (1.524 m)   Wt 136 lb (61.7 kg)   LMP 08/07/2017   SpO2 92%   BMI 26.56 kg/m    Physical Exam:  General- No distress,  A&Ox3, pleasant  ENT: No sinus tenderness, TM clear, pale nasal mucosa, no oral exudate,+ post nasal drip, no LAN Cardiac: S1, S2, regular rate and rhythm, no murmur Chest: + wheeze/ No rales/ dullness; no accessory muscle use, no nasal flaring, no sternal retractions Abd.: Soft Non-tender, ND, BS +, Non-obese Ext: No clubbing cyanosis, edema Neuro: Deconditioned at baseline, moving all extremities x4, alert and oriented x3 Skin: No rashes, warm and dry Psych: normal mood and behavior   Assessment/Plan  Pulmonary hypertension ([place])  Please continue Ralene Cork as you are taking them. Start  Tyvaso as directed by the advanced cardiology clinic and Dr. [name] Continue oxygen at 1-4 L/min depending on her level of exertion.  SPO2 is greater than 90%. TTE and 6 minute walk testing after Tyvaso has been established.   Pulmonary sarcoidosis ([place])  Please continue prednisone 2.5 mg once prd taper is complete. We will consider a change in the future depending on our testing and your clinical status..  Continue Flovent 2 puffs twice a day.  Remember to rinse and gargle after using this medication. We will order albuterol for you have available to use two puffs if needed for shortness of breath.  Follow with Dr [name] in 2 months or sooner if you have any problems.    Acute bronchitis Slow to resolve Patient did not take prednisone as prescribed by Tammy.  NP 08/19/2017 Suspect there is an additional element of postnasal drip contributing to cough Plan Neb treatment now We will call you with results We will prescribe a prednisone taper today. Prednisone 20 mg daily for 5 days then 10 mg daily for 5 days then 5 mg daily for 5 days then back to 2.5 mg daily. Please make sure you take this  We will add Zyrtec or Allegra daily for allergies. Take 1 tablet daily, generics are ok. Mucinex DM twice daily as needed for cough and congestion>> take with water. Tessalon Perles 3 times daily as needed for cough during the day. At night we will add Hydromet cough syrup 5 cc's at bedtime only. Do not drive if sleepy. Saline nasal rinses as needed Saline nasal gel at bedtime as needed Continue on Flovent twice daily Continue  on oxygen 2 L at rest and 3 L with activity Sips of water instead of throat clearing Sugar Free [employer] or [employer] originals for throat soothing. Continue Nexium daily Add pepcid 40 mg at bedtime. ( Get generic ) Follow up in 2 weeks to ensure you are better. Please contact office for sooner follow up if symptoms do not improve or worsen or seek emergency  care  Letter for work.>> Out of work through Friday 6/21.      Magdalen Spatz, NP 08/28/2017  7:21 PM

## 2017-08-28 NOTE — Assessment & Plan Note (Addendum)
  Please continue prednisone 2.5 mg once prd taper is complete. We will consider a change in the future depending on our testing and your clinical status..  Continue Flovent 2 puffs twice a day.  Remember to rinse and gargle after using this medication. We will order albuterol for you have available to use two puffs if needed for shortness of breath.  Follow with Dr [name] in 2 months or sooner if you have any problems.

## 2017-08-28 NOTE — Assessment & Plan Note (Signed)
Slow to resolve Patient did not take prednisone as prescribed by Tammy.  NP 08/19/2017 Suspect there is an additional element of postnasal drip contributing to cough Plan Neb treatment now We will call you with results We will prescribe a prednisone taper today. Prednisone 20 mg daily for 5 days then 10 mg daily for 5 days then 5 mg daily for 5 days then back to 2.5 mg daily. Please make sure you take this  We will add Zyrtec or Allegra daily for allergies. Take 1 tablet daily, generics are ok. Mucinex DM twice daily as needed for cough and congestion>> take with water. Tessalon Perles 3 times daily as needed for cough during the day. At night we will add Hydromet cough syrup 5 cc's at bedtime only. Do not drive if sleepy. Saline nasal rinses as needed Saline nasal gel at bedtime as needed Continue on Flovent twice daily Continue  on oxygen 2 L at rest and 3 L with activity Sips of water instead of throat clearing Sugar Free [employer] or [employer] originals for throat soothing. Continue Nexium daily Add pepcid 40 mg at bedtime. ( Get generic ) Follow up in 2 weeks to ensure you are better. Please contact office for sooner follow up if symptoms do not improve or worsen or seek emergency care  Letter for work.>> Out of work through Friday 6/21.

## 2017-08-28 NOTE — Patient Instructions (Addendum)
It is nice to see you today. CXR today. Neb treatment now We will call you with results We will prescribe a prednisone taper today. Prednisone 20 mg daily for 5 days then 10 mg daily for 5 days then 5 mg daily for 5 days then back to 2.5 mg daily. Please make sure you take this  We will add Zyrtec or Allegra daily for allergies. Take 1 tablet daily, generics are ok. Mucinex DM twice daily as needed for cough and congestion>> take with water. Tessalon Perles 3 times daily as needed for cough during the day. At night we will add Hydromet cough syrup 5 cc's at bedtime only. Do not drive if sleepy. Saline nasal rinses as needed Saline nasal gel at bedtime as needed Continue on Flovent twice daily Continue  on oxygen 2 L at rest and 3 L with activity Sips of water instead of throat clearing Sugar Free [employer] or [employer] originals for throat soothing. Continue Nexium daily Add pepcid 40 mg at bedtime. ( Get generic ) Follow up in 2 weeks to ensure you are better. Please contact office for sooner follow up if symptoms do not improve or worsen or seek emergency care  Letter for work.>> Out of work through Friday 6/21.

## 2017-09-09 NOTE — Progress Notes (Deleted)
_0  ID: Erin Zavala, female    DOB: 05-Dec-1965, 52 y.o.   MRN: [id number]  No chief complaint on file.   Referring provider: [name], MD  HPI:   Recent [place] [facility] Encounters:   Erin Zavala is a 52 y.o. female with pulmonary sarcoidosis and pulmonary HTN. She is followed by Dr. [name]. HPI 52 year old patient has been followed in our office by Dr. Ashok Cordia for sarcoidosis with pulmonary involvement, ocular involvement.  Is also followed in cardiology clinic for associated pulmonary arterial hypertension.  She is being treated with Ralene Cork and Tyvasco.  She follows with Dr. [name].  She has chronic hypoxemic respiratory failure and uses 1-4 liters per minute. Keeps track of her Spo2 and stays at goal. She needs a new ophthalmologist. She is currently on 2.82m prednisone.  Maintenance ARalene Cork Tyvasco   08/28/2017  Acute OV: Pt. Presents for an acute OV. She states she has been coughing and has noticed blood in her mucus. She states she has been compliant with her ARalene Cork and Tyvasco . She was seen by TRexene EdisonNP 08/19/2017 for cough and treated with prednisone taper, Augmentin, Mucinex DM, and Tessalon Perles. She states she did not have enough prednisone to take the taper.She did increase her dose to 7.5 mg per day x 10 days. She states she did initially feel better, but then got sick again.She states her secretions have become thicker and " chunkier". She states the secretions are yellow in color.She states she completed the Augmentin Tammy prescribed.She denies fever, chest pain, orthopnea or hemoptysis.She states she does have some shortness of breath and wheezing with the cough. No further blood in sputum. She states cough is worse at bedtime.She is compliant with her Nexium. Plan: Continue ARalene Cork start tight vascular.  Continue oxygen at 1 to 4 L depending on level of exertion, continue prednisone 2.5 mg once taper is  complete prednisone taper, continue Nexium, add Zyrtec, Tessalon Perles, follow-up in 2 weeks  Tests:  High-resolution CT chest March 2019 showed mediastinal hilar and pulmonary parenchymal fibrotic changes of sarcoid 2D echo May 2019 showed EF 60 to 65%, pulmonary artery pressure 37 mmHg (significant improvement from previous pulmonary pressure at 60 mmHg) PFT April 2019 showed FEV1 74%, ratio 87, FVC 68%, no significant bronchodilator response.  Positive mid flow reversibility.  DLCO 34%. [facility] evaluation for transplant eval , right and left heart cath.  Showed 80% LAD stenosis.  She was treated with DES.  Right heart cath showed mild to moderate pulmonary hypertension.    Imaging:   Cardiac:   Labs:   Micro:   Chart Review:    09/09/17 Follow Up     Allergies  Allergen Reactions  . No Known Allergies     Immunization History  Administered Date(s) Administered  . Influenza,inj,Quad PF,6+ Mos 02/28/2016, 12/18/2016  . PPD Test 02/28/2016  . Pneumococcal Conjugate-13 11/29/2016  . Pneumococcal Polysaccharide-23 11/03/2015    Past Medical History:  Diagnosis Date  . Asthma    as a child  . Cough   . Dyspnea   . GERD (gastroesophageal reflux disease)   . Migraines   . Oxygen dependent   . Pulmonary [place] ([place]   . Sarcoidosis     Tobacco History: Social History   Tobacco Use  Smoking Status Former Smoker  . Packs/day: 0.25  . Years: 10.00  . Pack years: 2.50  . Types: Cigarettes  . Last attempt to quit: 01/11/2011  . Years since  quitting: 6.6  Smokeless Tobacco Never Used   Counseling given: Not Answered   Outpatient Encounter Medications as of 09/10/2017  Medication Sig  . ADCIRCA 20 MG tablet Take 2 tablets (40 mg total) by mouth daily.  [name] albuterol (PROVENTIL HFA;VENTOLIN HFA) 108 (90 Base) MCG/ACT inhaler Inhale 2 puffs into the lungs every 4 (four) hours as needed for wheezing or shortness of breath.  [name] ambrisentan (LETAIRIS) 10 MG tablet  Take 1 tablet (10 mg total) by mouth daily.  [name] aspirin 81 MG tablet Take 1 tablet (81 mg total) by mouth daily.  [name] atorvastatin (LIPITOR) 40 MG tablet TAKE 1 TABLET BY MOUTH EVERY DAY  . benzonatate (TESSALON) 200 MG capsule Take 1 capsule (200 mg total) by mouth 3 (three) times daily as needed for cough.  . esomeprazole (NEXIUM) 40 MG capsule TAKE 1 CAPSULE BY MOUTH EVERY DAY  . famotidine (PEPCID) 40 MG tablet Take 1 tablet (40 mg total) by mouth daily.  . ferrous sulfate 325 (65 FE) MG tablet Take 1 tablet (325 mg total) by mouth 2 (two) times daily with a meal.  . fluticasone (FLOVENT HFA) 110 MCG/ACT inhaler Inhale 2 puffs 2 (two) times daily into the lungs.  [name] HYDROcodone-homatropine (HYDROMET) 5-1.5 MG/5ML syrup Take 5 mLs by mouth every 6 (six) hours as needed for cough.  . predniSONE (DELTASONE) 10 MG tablet Take 2 tabs daily for 5 days then 1 pill daily for 5 days then 0.5 mg daily for 5 days then back to 2.5 mg daily.  . predniSONE (DELTASONE) 2.5 MG tablet Take 1 tablet (2.5 mg total) by mouth daily with breakfast.  . ranitidine (ZANTAC) 150 MG tablet TAKE 1 TABLET (150 MG TOTAL) BY MOUTH AT BEDTIME.  . Treprostinil (TYVASO) 0.6 MG/ML SOLN Inhale 18 mcg into the lungs 4 (four) times daily.   No facility-administered encounter medications on file as of 09/10/2017.      Review of Systems  Constitutional:   No  weight loss, night sweats,  fevers, chills, fatigue, or  lassitude HEENT:   No headaches,  Difficulty swallowing,  Tooth/dental problems, or  Sore throat, No sneezing, itching, ear ache, nasal congestion, post nasal drip  CV: No chest pain,  orthopnea, PND, swelling in lower extremities, anasarca, dizziness, palpitations, syncope  GI: No heartburn, indigestion, abdominal pain, nausea, vomiting, diarrhea, change in bowel habits, loss of appetite, bloody stools Resp: No shortness of breath with exertion or at rest.  No excess mucus, no productive cough,  No non-productive cough,   No coughing up of blood.  No change in color of mucus.  No wheezing.  No chest wall deformity Skin: no rash, lesions, no skin changes. GU: no dysuria, change in color of urine, no urgency or frequency.  No flank pain, no hematuria  MS:  No joint pain or swelling.  No decreased range of motion.  No back pain. Psych:  No change in mood or affect. No depression or anxiety.  No memory loss.   Physical Exam  There were no vitals taken for this visit.  GEN: A/Ox3; pleasant , NAD, well nourished    HEENT:  [place]/AT,  EACs-clear, TMs-wnl, NOSE-clear, THROAT-clear, no lesions, no postnasal drip or exudate noted.   NECK:  Supple w/ fair ROM; no JVD; normal carotid impulses w/o bruits; no thyromegaly or nodules palpated; no lymphadenopathy.    RESP:  Clear  P & A; w/o, wheezes/ rales/ or rhonchi. no accessory muscle use, no dullness to percussion  CARD:  RRR, no m/r/g, no peripheral edema, pulses intact, no cyanosis or clubbing.  GI:   Soft & nt; nml bowel sounds; no organomegaly or masses detected.   Musco: Warm bil, no deformities or joint swelling noted.   Neuro: alert, no focal deficits noted.    Skin: Warm, no lesions or rashes    Lab Results:  CBC    Component Value Date/Time   WBC 7.7 12/05/2016 1219   RBC 4.00 12/05/2016 1219   HGB 8.6 Repeated and verified X2. (L) 12/05/2016 1219   HCT 28.4 (L) 12/05/2016 1219   PLT 419.0 (H) 12/05/2016 1219   MCV 71.1 (L) 12/05/2016 1219   MCH 29.0 01/13/2016 1245   MCHC 30.2 12/05/2016 1219   RDW 19.7 (H) 12/05/2016 1219   LYMPHSABS 1.1 09/26/2016 1707   MONOABS 0.7 09/26/2016 1707   EOSABS 0.3 09/26/2016 1707   BASOSABS 0.1 09/26/2016 1707    BMET    Component Value Date/Time   NA 138 11/29/2016 1702   K 4.3 11/29/2016 1702   CL 106 11/29/2016 1702   CO2 24 11/29/2016 1702   GLUCOSE 99 11/29/2016 1702   BUN 10 11/29/2016 1702   CREATININE 0.76 11/29/2016 1702   CALCIUM 9.6 11/29/2016 1702   GFRNONAA >60 05/18/2016 1109    GFRAA >60 05/18/2016 1109    BNP    Component Value Date/Time   BNP 16.5 05/18/2016 1109    ProBNP No results found for: PROBNP  Imaging: Dg Chest 2 View  Result Date: 08/28/2017 CLINICAL DATA:  52 year old female with cough and shortness of breath for 10 days. Sarcoidosis. EXAM: CHEST - 2 VIEW COMPARISON:  High-resolution chest CT 06/05/2017. Chest radiographs 05/03/2016. FINDINGS: Diffuse coarse bilateral pulmonary interstitial opacity appears stable since February. Stable lung volumes. No pneumothorax, pleural effusion or acute pulmonary opacity. Stable mediastinal contours. No cardiomegaly. Visualized tracheal air column is within normal limits. No acute osseous abnormality identified. Negative visible bowel gas pattern. IMPRESSION: Stable Sarcoid related chronic lung disease. No superimposed acute findings are identified. Electronically Signed   By: Genevie Ann M.D.   On: 08/28/2017 16:00     Assessment & Plan:   No problem-specific Assessment & Plan notes found for this encounter.     Lauraine Rinne, NP 09/09/2017

## 2017-09-10 ENCOUNTER — Ambulatory Visit: Payer: Medicaid Other | Admitting: Pulmonary Disease

## 2017-09-17 ENCOUNTER — Other Ambulatory Visit: Payer: Self-pay | Admitting: *Deleted

## 2017-09-17 MED ORDER — FLUTICASONE PROPIONATE HFA 110 MCG/ACT IN AERO: 2.0000 | INHALATION_SPRAY | Freq: Two times a day (BID) | RESPIRATORY_TRACT | 5 refills | Status: DC

## 2017-09-19 ENCOUNTER — Telehealth: Payer: Self-pay | Admitting: Emergency Medicine

## 2017-09-19 ENCOUNTER — Other Ambulatory Visit: Payer: Self-pay | Admitting: Acute Care

## 2017-09-19 ENCOUNTER — Other Ambulatory Visit: Payer: Self-pay | Admitting: Adult Health

## 2017-09-19 NOTE — Telephone Encounter (Signed)
Looks like the hydromet was given by SG. I am willing to refill x 1. An alternative if she would like to try would be Delsym.

## 2017-09-19 NOTE — Telephone Encounter (Signed)
Called and spoke with patient, she states that she has not been able to get rid of her cough since having pneumonia. She has no other symptoms. She is requesting a refill of her hydromet to help with the cough. She states that if there is something else she could take over the counter that would help better she would be willing to do that.    Patient was last seen by RB please advise, instructions from last visit are listed below, thank you.     Return in about 2 weeks (around 09/11/2017), or if symptoms worsen or fail to improve.   It is nice to see you today. CXR today. Neb treatment now We will call you with results We will prescribe a prednisone taper today. Prednisone 20 mg daily for 5 days then 10 mg daily for 5 days then 5 mg daily for 5 days then back to 2.5 mg daily. Please make sure you take this  We will add Zyrtec or Allegra daily for allergies. Take 1 tablet daily, generics are ok. Mucinex DM twice daily as needed for cough and congestion>> take with water. Tessalon Perles 3 times daily as needed for cough during the day. At night we will add Hydromet cough syrup 5 cc's at bedtime only. Do not drive if sleepy. Saline nasal rinses as needed Saline nasal gel at bedtime as needed Continue on Flovent twice daily Continue on oxygen 2 L at rest and 3 L with activity Sips of water instead of throat clearing Sugar Free [employer] or [employer] originals for throat soothing. Continue Nexium daily Add pepcid 40 mg at bedtime. ( Get generic ) Follow up in 2 weeks to ensure you are better. Please contact office for sooner follow up if symptoms do not improve or worsen or seek emergency care  Letter for work.>> Out of work through Friday 6/21.

## 2017-09-19 NOTE — Telephone Encounter (Signed)
Called and spoke to patient. Patient stated she hasn't been feeling better and was told by SG to come back if not better. Patient stated she will get OTC Delsym and that she wanted to make an appointment. Scheduled pt to see MW for 09/20/17 at 1630. Nothing further needed at this time.

## 2017-09-20 ENCOUNTER — Encounter: Payer: Self-pay | Admitting: Internal Medicine

## 2017-09-20 ENCOUNTER — Ambulatory Visit (INDEPENDENT_AMBULATORY_CARE_PROVIDER_SITE_OTHER)
Admission: RE | Admit: 2017-09-20 | Discharge: 2017-09-20 | Disposition: A | Discharge status: Home / Self Care | Payer: Medicaid Other | Source: Ambulatory Visit | Attending: Internal Medicine | Admitting: Internal Medicine

## 2017-09-20 ENCOUNTER — Encounter: Payer: Self-pay | Admitting: *Deleted

## 2017-09-20 ENCOUNTER — Ambulatory Visit (INDEPENDENT_AMBULATORY_CARE_PROVIDER_SITE_OTHER): Payer: Medicaid Other | Admitting: Internal Medicine

## 2017-09-20 VITALS — BP 120/66 | HR 90 | Temp 97.6°F | Ht 60.0 in | Wt 136.6 lb

## 2017-09-20 DIAGNOSIS — J9611 Chronic respiratory failure with hypoxia: Secondary | ICD-10-CM

## 2017-09-20 DIAGNOSIS — J45909 Unspecified asthma, uncomplicated: Secondary | ICD-10-CM | POA: Insufficient documentation

## 2017-09-20 DIAGNOSIS — I272 Pulmonary hypertension, unspecified: Secondary | ICD-10-CM | POA: Diagnosis not present

## 2017-09-20 MED ORDER — AMOXICILLIN-POT CLAVULANATE 875-125 MG PO TABS: 1.0000 | ORAL_TABLET | Freq: Two times a day (BID) | ORAL | 0 refills | Status: AC

## 2017-09-20 MED ORDER — TRAMADOL HCL 50 MG PO TABS: 50.0000 mg | ORAL_TABLET | ORAL | 0 refills | Status: DC | PRN

## 2017-09-20 MED ORDER — BUDESONIDE-FORMOTEROL FUMARATE 160-4.5 MCG/ACT IN AERO: 2.0000 | INHALATION_SPRAY | Freq: Two times a day (BID) | RESPIRATORY_TRACT | 0 refills | Status: DC

## 2017-09-20 MED ORDER — IPRATROPIUM-ALBUTEROL 0.5-2.5 (3) MG/3ML IN SOLN
3.0000 mL | Freq: Once | RESPIRATORY_TRACT | Status: AC
  Administered 2017-09-20: 3 mL via RESPIRATORY_TRACT

## 2017-09-20 MED ORDER — TRAMADOL HCL 50 MG PO TABS: 50.0000 mg | ORAL_TABLET | ORAL | 0 refills | Status: AC | PRN

## 2017-09-20 MED ORDER — IPRATROPIUM-ALBUTEROL 0.5-2.5 (3) MG/3ML IN SOLN: 3.0000 mL | Freq: Four times a day (QID) | RESPIRATORY_TRACT | 11 refills | Status: DC | PRN

## 2017-09-20 MED ORDER — PREDNISONE 10 MG PO TABS: ORAL_TABLET | ORAL | 0 refills | Status: DC

## 2017-09-20 NOTE — Progress Notes (Signed)
History of Present Illness Erin Zavala is a 52 y.o. female with pulmonary sarcoidosis and pulmonary HTN. She is followed by Dr. [name].    Brief patient profile:  59 yobf quit smoking 2012  had been followed in our office by Dr. Ashok Cordia for sarcoidosis with pulmonary involvement, ocular involvement.  Is also followed in cardiology clinic for associated pulmonary arterial hypertension.  She is being treated with Ralene Cork and Tyvasco.  She follows with Dr. [name].  She has chronic hypoxemic respiratory failure and uses 1-4 liters per minute. Keeps track of her Spo2 and stays at goal.      Maintenance Adcirca, Letairis, Tyvasco and typically pred 2.5 "floor"   08/28/2017  Acute OV: NP  Pt. Presents for an acute OV. She states she has been coughing and has noticed blood in her mucus. She states she has been compliant with her Ralene Cork, and Tyvasco . She was seen by Rexene Edison NP 08/19/2017 for cough and treated with prednisone taper, Augmentin, Mucinex DM, and Tessalon Perles. She states she did not have enough prednisone to take the taper.She did increase her dose to 7.5 mg per day x 10 days. She states she did initially feel better, but then got sick again.She states her secretions have become thicker and " chunkier". She states the secretions are yellow in color.She states she completed the Augmentin Tammy prescribed.She denies fever, chest pain, orthopnea or hemoptysis.She states she does have some shortness of breath and wheezing with the cough. No further blood in sputum. She states cough is worse at bedtime.She is compliant with her Nexium. rec Acute bronchitis Slow to resolve Patient did not take prednisone as prescribed by Tammy.  NP 08/19/2017 Suspect there is an additional element of postnasal drip contributing to cough Plan Neb treatment now  Prednisone 20 mg daily for 5 days then 10 mg daily for 5 days then 5 mg daily for 5 days then back to 2.5 mg daily. We will  add Zyrtec or Allegra daily for allergies. Take 1 tablet daily, generics are ok. Mucinex DM twice daily as needed for cough and congestion>> take with water. Tessalon Perles 3 times daily as needed for cough during the day. At night we will add Hydromet cough syrup 5 cc's at bedtime only. Saline nasal rinses as needed Saline nasal gel at bedtime as needed Continue on Flovent twice daily Continue  on oxygen 2 L at rest and 3 L with activity Sips of water instead of throat clearing Sugar Free [employer] or [employer] originals for throat soothing. Continue Nexium daily Add pepcid 40 mg at bedtime. ( Get generic )   09/20/2017 acute extended ov/Broedy Osbourne re: refractory cough and sob  Chief Complaint  Patient presents with  . Acute Visit    Increased cough x 3 days- prod with yellow sputum.  She is also having wheezing and increased SOB. She is using her albuterol inhaler multiple times per day.    had improved some  p last ov but tapered off pred on 09/17/17 and downhill since as did not fill the 2.5 mg  On flovent prior to first week in June 2019 rarely needed proair but hfa very poor baseline technique Since early feels like sinus infection never got better with nasal congestion and purulent d/c and same color sputum  nexium one hour before then pepcid 40 mg at 3pm  Severe hacking 24/7 Cough no better with pearls Sob at rest when coughing but sats still fine  No obvious day to day or daytime variability or assoc production mucus plugs or hemoptysis or cp or chest tightness, subjective wheeze or overt sinus or hb symptoms.    . Also denies any obvious fluctuation of symptoms with weather or environmental changes or other aggravating or alleviating factors except as outlined above   No unusual exposure hx or h/o childhood pna/ asthma or knowledge of premature birth.  Current Allergies, Complete Past Medical History, Past Surgical History, Family History, and Social History were reviewed in  [employer] record.  ROS  The following are not active complaints unless bolded Hoarseness, sore throat, dysphagia, dental problems, itching, sneezing,  nasal congestion or discharge of excess mucus or purulent secretions, ear ache,   fever, chills, sweats, unintended wt loss or wt gain, classically pleuritic or exertional cp,  orthopnea pnd or arm/hand swelling  or leg swelling, presyncope, palpitations, abdominal pain, anorexia, nausea, vomiting, diarrhea  or change in bowel habits or change in bladder habits, change in stools or change in urine, dysuria, hematuria,  rash, arthralgias, visual complaints, headache, numbness, weakness or ataxia or problems with walking or coordination,  change in mood or  memory.        Current Meds  Medication Sig  . ADCIRCA 20 MG tablet Take 2 tablets (40 mg total) by mouth daily.  [name] albuterol (PROVENTIL HFA;VENTOLIN HFA) 108 (90 Base) MCG/ACT inhaler Inhale 2 puffs into the lungs every 4 (four) hours as needed for wheezing or shortness of breath.  [name] ambrisentan (LETAIRIS) 10 MG tablet Take 1 tablet (10 mg total) by mouth daily.  [name] aspirin 81 MG tablet Take 1 tablet (81 mg total) by mouth daily.  [name] atorvastatin (LIPITOR) 40 MG tablet TAKE 1 TABLET BY MOUTH EVERY DAY  . esomeprazole (NEXIUM) 40 MG capsule TAKE 1 CAPSULE BY MOUTH EVERY DAY  . famotidine (PEPCID) 40 MG tablet Take 1 tablet (40 mg total) by mouth daily.  . ferrous sulfate 325 (65 FE) MG tablet Take 1 tablet (325 mg total) by mouth 2 (two) times daily with a meal.  . OXYGEN 2-3 lpm 24/7  [facility]  . ranitidine (ZANTAC) 150 MG tablet TAKE 1 TABLET (150 MG TOTAL) BY MOUTH AT BEDTIME.  . Treprostinil (TYVASO) 0.6 MG/ML SOLN Inhale 18 mcg into the lungs 4 (four) times daily.  . [  benzonatate (TESSALON) 200 MG capsule Take 1 capsule (200 mg total) by mouth 3 (three) times daily as needed for cough.  .   fluticasone (FLOVENT HFA) 110 MCG/ACT inhaler Inhale 2 puffs into the lungs 2  (two) times daily.                Past medical hx Past Medical History:  Diagnosis Date  . Asthma    as a child  . Cough   . Dyspnea   . GERD (gastroesophageal reflux disease)   . Migraines   . Oxygen dependent   . Pulmonary hypertension ([place])   . Sarcoidosis      Social History   Tobacco Use  . Smoking status: Former Smoker    Packs/day: 0.25    Years: 10.00    Pack years: 2.50    Types: Cigarettes    Last attempt to quit: 01/11/2011    Years since quitting: 6.6  . Smokeless tobacco: Never Used  Substance Use Topics  . Alcohol use: No  . Drug use: No        Physical Exam:  W/c bound bf very harsh  cough    Wt Readings from Last 3 Encounters:  09/20/17 136 lb 9.6 oz (62 kg)  08/28/17 136 lb (61.7 kg)  08/19/17 136 lb (61.7 kg)     Vital signs reviewed - Note on arrival 02 sats  96% on 3 lpm cont     HEENT: nl dentition, turbinates bilaterally, and oropharynx. Nl external ear canals without cough reflex   NECK :  without JVD/Nodes/TM/ nl carotid upstrokes bilaterally   LUNGS: no acc muscle use,  Nl contour chest with prominent insp / exp rhonchi bilaterally better p neb    CV:  RRR  no s3 or murmur or increase in P2, and no edema   ABD:  soft and nontender with nl inspiratory excursion in the supine position. No bruits or organomegaly appreciated, bowel sounds nl  MS:   ext warm without deformities, calf tenderness, cyanosis or clubbing No obvious joint restrictions   SKIN: warm and dry without lesions    NEURO:  alert, approp, nl sensorium with  no motor or cerebellar deficits apparent.      CXR PA and Lateral:   09/20/2017 :    I personally reviewed images and agree with radiology impression as follows:   Relatively unchanged chronic interstitial lung disease secondary to sarcoidosis. No active cardiopulmonary disease.    Assessment/Plan

## 2017-09-20 NOTE — Patient Instructions (Addendum)
Take delsym two tsp every 12 hours and supplement if needed with  tramadol 50 mg up to 1-2 every 4 hours to suppress the urge to cough. Swallowing water and/or using ice chips/non mint and menthol containing candies (such as lifesavers or sugarless jolly ranchers) are also effective.  You should rest your voice and avoid activities that you know make you cough.  Once you have eliminated the cough for 3 straight days try reducing the tramadol first,  then the delsym as tolerated.     Augmentin 875 mg take one pill twice daily  X 10 days - take at breakfast and supper with large glass of water.  It would help reduce the usual side effects (diarrhea and yeast infections) if you ate cultured yogurt at lunch.   Prednisone Take 4 for three days 3 for three days 2 for three days 1 for three days and then resume 2.5 mg daily    Plan A = Automatic = symbicort 160 . Take 2 puffs first thing in am and then another 2 puffs about 12 hours later.    Work on inhaler technique:  relax and gently blow all the way out then take a nice smooth deep breath back in, triggering the inhaler at same time you start breathing in.  Hold for up to 5 seconds if you can. Blow out thru nose. Rinse and gargle with water when done      Plan B = Backup Only use your albuterol as a rescue medication to be used if you can't catch your breath by resting or doing a relaxed purse lip breathing pattern.  - The less you use it, the better it will work when you need it. - Ok to use the inhaler up to 2 puffs  every 4 hours if you must but call for appointment if use goes up over your usual need - Don't leave home without it !!  (think of it like the spare tire for your car)   Plan C = Crisis - only use your albuterol/ipatropium  nebulizer if you first try Plan B and it fails to help > ok to use the nebulizer up to every 4 hours but if start needing it regularly call for immediate appointment   Please remember to go to the  x-ray  department downstairs in the basement  for your tests - we will call you with the results when they are available.     Keep appt with Dr [name] go to ER if worse

## 2017-09-21 ENCOUNTER — Encounter: Payer: Self-pay | Admitting: Internal Medicine

## 2017-09-21 NOTE — Assessment & Plan Note (Addendum)
09/20/2017  After extensive coaching inhaler device  effectiveness =    50% from a baseline of 25% > changed flovent to symb 160 2bid and provided neb for backup duoneb   DDX of  difficult airways management almost all start with A and  include Adherence, Ace Inhibitors, Acid Reflux, Active Sinus Disease, Alpha 1 Antitripsin deficiency, Anxiety masquerading as Airways dz,  ABPA,  Allergy(esp in young), Aspiration (esp in elderly), Adverse effects of meds,  Active smokers, A bunch of PE's (a small clot burden can't cause this syndrome unless there is already severe underlying pulm or vascular dz with poor reserve) plus two Bs  = Bronchiectasis and Beta blocker use..and one C= CHF   Adherence is always the initial "prime suspect" and is a multilayered concern that requires a "trust but verify" approach in every patient - starting with knowing how to use medications, especially inhalers, correctly, keeping up with refills and understanding the fundamental difference between maintenance and prns vs those medications only taken for a very short course and then stopped and not refilled.  - see hfa training above, not really all that successful so provided neb as Plan C see avs for instructions unique to this ov    ? Acid (or non-acid) GERD > always difficult to exclude as up to 75% of pts in some series report no assoc GI/ Heartburn symptoms> rec max (24h)  acid suppression and diet restrictions/ reviewed and instructions given in writing.  - Of the three most common causes of  Sub-acute / recurrent or chronic cough, only one (GERD)  can actually contribute to/ trigger  the other two (asthma and post nasal drip syndrome)  and perpetuate the cylce of cough.  While not intuitively obvious, many patients with chronic low grade reflux do not cough until there is a primary insult that disturbs the protective epithelial barrier and exposes sensitive nerve endings.   This is typically viral but can due to PNDS and   either may apply here.   The point is that once this occurs, it is difficult to eliminate the cycle  using anything but a maximally effective acid suppression regimen at least in the short run, accompanied by an appropriate diet to address non acid GERD and control / eliminate the cough itself for at least 3 days with tramadol   ?Active sinusitis > Augmentin 875 mg take one pill twice daily  X 10 days -consider sinus ct next rather than repeat empirical rx   ? Allergy / T2 driven inflammation vs sarcoid airway involvement (moot point as all should be pred responsive > pred x 12 day taper and then resume floor of 2.5 mg daily   ? Chf/ cor pulmonale > no evidence of decomp on triple rx already > no need to change   I had an extended discussion with the patient reviewing all relevant studies completed to date and  lasting 25 minutes of a 40  minute acute office visit with pt new to me     re  severe non-specific but potentially very serious refractory respiratory symptoms of uncertain and potentially multiple  etiologies.   Each maintenance medication was reviewed in detail including most importantly the difference between maintenance and prns and under what circumstances the prns are to be triggered using an action plan format that is not reflected in the computer generated alphabetically organized AVS.    Please see AVS for specific instructions unique to this office visit that I personally wrote and  verbalized to the the pt in detail and then reviewed with pt  by my nurse highlighting any changes in therapy/plan of care  recommended at today's visit.

## 2017-09-21 NOTE — Assessment & Plan Note (Signed)
Despite airway flare > Adequate control on present rx, reviewed in detail with pt > no change in rx needed

## 2017-09-23 ENCOUNTER — Encounter: Payer: Self-pay | Admitting: Internal Medicine

## 2017-09-23 ENCOUNTER — Encounter: Payer: Self-pay | Admitting: *Deleted

## 2017-09-23 NOTE — Progress Notes (Signed)
Spoke with pt and notified of results per Dr. Wert. Pt verbalized understanding and denied any questions. 

## 2017-10-03 ENCOUNTER — Ambulatory Visit: Payer: Medicaid Other | Admitting: Emergency Medicine

## 2017-10-03 ENCOUNTER — Encounter: Payer: Self-pay | Admitting: Emergency Medicine

## 2017-10-03 DIAGNOSIS — I272 Pulmonary hypertension, unspecified: Secondary | ICD-10-CM | POA: Diagnosis not present

## 2017-10-03 DIAGNOSIS — K219 Gastro-esophageal reflux disease without esophagitis: Secondary | ICD-10-CM | POA: Diagnosis not present

## 2017-10-03 DIAGNOSIS — J9611 Chronic respiratory failure with hypoxia: Secondary | ICD-10-CM | POA: Diagnosis not present

## 2017-10-03 DIAGNOSIS — D860 Sarcoidosis of lung: Secondary | ICD-10-CM

## 2017-10-03 NOTE — Progress Notes (Signed)
Subjective:    Patient ID: Erin Zavala, female    DOB: Aug 01, 1965, 52 y.o.   MRN: [id number]  HPI 52 year old patient has been followed in our office by Dr. Ashok Cordia for sarcoidosis with pulmonary involvement, ocular involvement.  Is also followed in cardiology clinic for associated pulmonary arterial hypertension.  She is being treated with Ralene Cork.  Plans are in place now for her to initiate Tyvaso.  She follows with Dr. [name].  She has chronic hypoxemic respiratory failure and uses 1-4 liters per minute. Keeps track of her Spo2 and stays at goal. She needs a new ophthalmologist. She is currently on 2.16m prednisone. She believes that her breathing is doing well. Her vision has some ups and downs, some conjunctivitis sx. She remains on flovent   ROV 10/03/17 --patient has a history of sarcoidosis with pulmonary and ocular involvement, associated secondary pulmonary hypertension on Adcirca, Latera's, Tyvaso.  Associated chronic hypoxemic respiratory failure.  She was seen about 2 weeks ago with increased cough, mucus both from her head and her chest.  She was treated with a brief taper of prednisone back down to 2.5 mg daily - will be back to that dose in a couple days. pepcid was added to nexium. She was on flovent, changed temporarily to symbicort.   PFT from 06/26/2017 reviewed.  This shows moderately severe mixed obstruction and restriction with restricted lung volumes and a significantly decreased diffusion capacity.  She underwent a CT scan of the chest on 06/05/2017 that I reviewed.  This showed stable chronic changes of sarcoidosis without any interval progression.  Review of Systems  Past Medical History:  Diagnosis Date  . Asthma    as a child  . Cough   . Dyspnea   . GERD (gastroesophageal reflux disease)   . Migraines   . Oxygen dependent   . Pulmonary hypertension ([place]   . Sarcoidosis      Family History  Problem Relation Age of Onset  . Hypertension Mother   .  Hypertension Father   . Sarcoidosis Maternal Aunt   . Rheumatologic disease Neg Hx      Social History   Socioeconomic History  . Marital status: Married    Spouse name: Not on file  . Number of children: Not on file  . Years of education: Not on file  . Highest education level: Not on file  Occupational History  . Not on file  Social Needs  . Financial resource strain: Not on file  . Food insecurity:    Worry: Not on file    Inability: Not on file  . Transportation needs:    Medical: Not on file    Non-medical: Not on file  Tobacco Use  . Smoking status: Former Smoker    Packs/day: 0.25    Years: 10.00    Pack years: 2.50    Types: Cigarettes    Last attempt to quit: 01/11/2011    Years since quitting: 6.7  . Smokeless tobacco: Never Used  Substance and Sexual Activity  . Alcohol use: No  . Drug use: No  . Sexual activity: Not on file  Lifestyle  . Physical activity:    Days per week: Not on file    Minutes per session: Not on file  . Stress: Not on file  Relationships  . Social connections:    Talks on phone: Not on file    Gets together: Not on file    Attends religious service: Not on file  Active member of club or organization: Not on file    Attends meetings of clubs or organizations: Not on file    Relationship status: Not on file  . Intimate partner violence:    Fear of current or ex partner: Not on file    Emotionally abused: Not on file    Physically abused: Not on file    Forced sexual activity: Not on file  Other Topics Concern  . Not on file  Social History Narrative   Patient denies any bird or mold exposure. Currently works with exposure to inhaled perfumes and chemicals. 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 Previous exposure to inhaled dust while working in [employer]. Denies any IV drug use or previous incarceration. No known tuberculosis exposure. She has never lived nor  volunteered at a homeless shelter.     Allergies  Allergen Reactions  . No Known Allergies      Outpatient Medications Prior to Visit  Medication Sig Dispense Refill  . ADCIRCA 20 MG tablet Take 2 tablets (40 mg total) by mouth daily. 180 tablet 3  . albuterol (PROVENTIL HFA;VENTOLIN HFA) 108 (90 Base) MCG/ACT inhaler Inhale 2 puffs into the lungs every 4 (four) hours as needed for wheezing or shortness of breath. 1 Inhaler 5  . ambrisentan (LETAIRIS) 10 MG tablet Take 1 tablet (10 mg total) by mouth daily. 30 tablet 11  . aspirin 81 MG tablet Take 1 tablet (81 mg total) by mouth daily. 30 tablet 0  . atorvastatin (LIPITOR) 40 MG tablet TAKE 1 TABLET BY MOUTH EVERY DAY 30 tablet 11  . budesonide-formoterol (SYMBICORT) 160-4.5 MCG/ACT inhaler Inhale 2 puffs into the lungs 2 (two) times daily. 1 Inhaler 0  . esomeprazole (NEXIUM) 40 MG capsule TAKE 1 CAPSULE BY MOUTH EVERY DAY 30 capsule 3  . famotidine (PEPCID) 40 MG tablet Take 1 tablet (40 mg total) by mouth daily. 30 tablet 2  . ferrous sulfate 325 (65 FE) MG tablet Take 1 tablet (325 mg total) by mouth 2 (two) times daily with a meal. 60 tablet 2  . ipratropium-albuterol (DUONEB) 0.5-2.5 (3) MG/3ML SOLN Take 3 mLs by nebulization every 6 (six) hours as needed. 360 mL 11  . OXYGEN 2-3 lpm 24/7  AHC    . predniSONE (DELTASONE) 10 MG tablet Take 4 for three days 3 for three days 2 for three days 1 for three days and then resume 2.5 mg daily 30 tablet 0  . predniSONE (DELTASONE) 2.5 MG tablet Take 1 tablet (2.5 mg total) by mouth daily with breakfast. 60 tablet 1  . ranitidine (ZANTAC) 150 MG tablet TAKE 1 TABLET (150 MG TOTAL) BY MOUTH AT BEDTIME. 30 tablet 3  . Treprostinil (TYVASO) 0.6 MG/ML SOLN Inhale 18 mcg into the lungs 4 (four) times daily.     No facility-administered medications prior to visit.         Objective:   Physical Exam Vitals:   10/03/17 1139  BP: 120/82  Pulse: 86  SpO2: 99%  Weight: 141 lb (64 kg)    Height: 5' (1.524 m)   Gen: Pleasant, well-nourished, in no distress,  normal affect  ENT: No lesions,  mouth clear,  oropharynx clear, no postnasal drip  Neck: No JVD, no stridor  Lungs: No use of accessory muscles, clear without rales or rhonchi  Cardiovascular: RRR, heart sounds normal, no murmur or gallops, no peripheral edema  Musculoskeletal: No deformities, no cyanosis or clubbing  Neuro: alert, non focal  Skin: Warm, no lesions or rash     Assessment & Plan:  Pulmonary sarcoidosis (HCC) Stable by CT chest.  Note mixed obstruction and restriction on pulmonary function testing, significant diffusion defect.  With recent flare also with some superimposed rhinitis and cough.  She has improved with a prednisone taper is almost back down her usual 2.5 mg daily.  She was also stepped up from Flovent to Symbicort.  She is feeling better.  We will continue the Symbicort for now, reassess in 3 months to see if we can scale back down to the Flovent.  Pulmonary hypertension ([place]) She is now on Tyvaso in addition to [place].  Tolerating well.  Continue same with follow-up at the advanced CHF clinic.  Chronic respiratory failure with hypoxia (HCC) O2 as ordered 1-3L/min.   GERD (gastroesophageal reflux disease) Continue nexium. Ok to try stopping pepcid  Baltazar Apo, MD, PhD 10/03/2017, 12:13 PM [facility] [phone] or if no answer [phone]

## 2017-10-03 NOTE — Patient Instructions (Signed)
We will stay on Symbicort 2 puffs twice daily for now.  Depending on how you are doing at your next visit we may decide to change this back to Flovent.  Please remember to rinse and gargle after using this medication. Continue Nexium as you are taking it. You can stop Pepcid Taper your prednisone back down to your usual 2.5 mg daily. Continue Larena Sox as you have been taking them. Continue your oxygen at 1 to 3 L/min depending on your level of activity. Follow with Dr [name] in 3 months or sooner if you have any problems.

## 2017-10-03 NOTE — Assessment & Plan Note (Signed)
She is now on Tyvaso in addition to [place].  Tolerating well.  Continue same with follow-up at the advanced CHF clinic.

## 2017-10-03 NOTE — Assessment & Plan Note (Signed)
O2 as ordered 1-3L/min.

## 2017-10-03 NOTE — Assessment & Plan Note (Addendum)
Stable by CT chest.  Note mixed obstruction and restriction on pulmonary function testing, significant diffusion defect.  With recent flare also with some superimposed rhinitis and cough.  She has improved with a prednisone taper is almost back down her usual 2.5 mg daily.  She was also stepped up from Flovent to Symbicort.  She is feeling better.  We will continue the Symbicort for now, reassess in 3 months to see if we can scale back down to the Flovent.

## 2017-10-03 NOTE — Assessment & Plan Note (Signed)
Continue nexium. Ok to try stopping pepcid

## 2017-10-08 ENCOUNTER — Other Ambulatory Visit (HOSPITAL_COMMUNITY): Payer: Self-pay

## 2017-10-08 ENCOUNTER — Ambulatory Visit (HOSPITAL_COMMUNITY)
Admission: RE | Admit: 2017-10-08 | Discharge: 2017-10-08 | Disposition: A | Discharge status: Home / Self Care | Payer: Medicaid Other | Source: Ambulatory Visit | Attending: Cardiology | Admitting: Cardiology

## 2017-10-08 ENCOUNTER — Encounter: Payer: Self-pay | Admitting: *Deleted

## 2017-10-08 ENCOUNTER — Encounter (HOSPITAL_COMMUNITY): Payer: Self-pay | Admitting: Cardiology

## 2017-10-08 ENCOUNTER — Other Ambulatory Visit: Payer: Self-pay | Admitting: Emergency Medicine

## 2017-10-08 VITALS — BP 108/64 | HR 84 | Wt 141.0 lb

## 2017-10-08 DIAGNOSIS — Z87891 Personal history of nicotine dependence: Secondary | ICD-10-CM | POA: Diagnosis not present

## 2017-10-08 DIAGNOSIS — J45909 Unspecified asthma, uncomplicated: Secondary | ICD-10-CM | POA: Insufficient documentation

## 2017-10-08 DIAGNOSIS — I2510 Atherosclerotic heart disease of native coronary artery without angina pectoris: Secondary | ICD-10-CM | POA: Insufficient documentation

## 2017-10-08 DIAGNOSIS — Z7982 Long term (current) use of aspirin: Secondary | ICD-10-CM | POA: Diagnosis not present

## 2017-10-08 DIAGNOSIS — I272 Pulmonary hypertension, unspecified: Secondary | ICD-10-CM | POA: Diagnosis not present

## 2017-10-08 DIAGNOSIS — Z955 Presence of coronary angioplasty implant and graft: Secondary | ICD-10-CM | POA: Diagnosis not present

## 2017-10-08 DIAGNOSIS — Z8249 Family history of ischemic heart disease and other diseases of the circulatory system: Secondary | ICD-10-CM | POA: Diagnosis not present

## 2017-10-08 DIAGNOSIS — D860 Sarcoidosis of lung: Secondary | ICD-10-CM

## 2017-10-08 DIAGNOSIS — Z9981 Dependence on supplemental oxygen: Secondary | ICD-10-CM | POA: Diagnosis not present

## 2017-10-08 DIAGNOSIS — D8689 Sarcoidosis of other sites: Secondary | ICD-10-CM | POA: Diagnosis not present

## 2017-10-08 DIAGNOSIS — Z7952 Long term (current) use of systemic steroids: Secondary | ICD-10-CM | POA: Insufficient documentation

## 2017-10-08 DIAGNOSIS — J9611 Chronic respiratory failure with hypoxia: Secondary | ICD-10-CM | POA: Diagnosis not present

## 2017-10-08 DIAGNOSIS — K219 Gastro-esophageal reflux disease without esophagitis: Secondary | ICD-10-CM | POA: Insufficient documentation

## 2017-10-08 DIAGNOSIS — Z7951 Long term (current) use of inhaled steroids: Secondary | ICD-10-CM | POA: Diagnosis not present

## 2017-10-08 DIAGNOSIS — Z79899 Other long term (current) drug therapy: Secondary | ICD-10-CM | POA: Diagnosis not present

## 2017-10-08 DIAGNOSIS — Z006 Encounter for examination for normal comparison and control in clinical research program: Secondary | ICD-10-CM

## 2017-10-08 DIAGNOSIS — I2721 Secondary pulmonary arterial hypertension: Secondary | ICD-10-CM | POA: Insufficient documentation

## 2017-10-08 LAB — BRAIN NATRIURETIC PEPTIDE: B Natriuretic Peptide: 17.6 pg/mL (ref 0.0–100.0)

## 2017-10-08 MED ORDER — BUDESONIDE-FORMOTEROL FUMARATE 160-4.5 MCG/ACT IN AERO: 2.0000 | INHALATION_SPRAY | Freq: Two times a day (BID) | RESPIRATORY_TRACT | 1 refills | Status: DC

## 2017-10-08 MED ORDER — AMBRISENTAN 10 MG PO TABS: 10.0000 mg | ORAL_TABLET | Freq: Every day | ORAL | 11 refills | Status: DC

## 2017-10-08 NOTE — Progress Notes (Signed)
6 Minute Walk  Patient ambulated 1250 ft (381 meters) with brisk ambulation, no rest breask required, minimal dyspnea.  Patient tolerated very well.  Started on 2.5 L continuous O2 and had to increase to 3.5 L after desatting with amb to 84%.  Dr. [name] made aware

## 2017-10-08 NOTE — Patient Instructions (Signed)
6 minute walk today.  Routine lab work today. Will notify you of abnormal results, otherwise no news is good news!  Follow up 3 months with Dr. [name].  _____________________________________________________________ Erin Zavala Code: 1779  Take all medication as prescribed the day of your appointment. Bring all medications with you to your appointment.  Do the following things EVERYDAY: 1) Weigh yourself in the morning before breakfast. Write it down and keep it in a log. 2) Take your medicines as prescribed 3) Eat low salt foods-Limit salt (sodium) to 2000 mg per day.  4) Stay as active as you can everyday 5) Limit all fluids for the day to less than 2 liters

## 2017-10-08 NOTE — Progress Notes (Signed)
Pulmonology: Dr. [name] Cardiology: Dr. [name]  52 y.o. with sarcoidosis, chronic hypoxemic respiratory failure on home oxygen, and pulmonary hypertension presents for followup of CAD and pulmonary hypertension.  She was diagnosed with ocular sarcoidosis in 1997. It seems like she did not have significant lung complications (that were recognized at least) until early 2017.  She now requires home oxygen.  CTA chest shows changes consistent with sarcoidosis, this has been confirmed by biopsy.  Echo in 8/17 had evidence for RV strain and elevated PA pressure.  [place] in 10/17 confirmed pulmonary arterial hypertension with normal right and left heart filling pressures.    She has started on macitentan followed by Adcirca and most recently Tyvaso.  She feels like these medications have helped her breathing.    She went to [facility] for transplant evaluation in 4/18.  As part of that evaluation, she had right and left heart cath.  Surprisingly, given age and lack of family history, she had an 80% LAD stenosis.  This was treated with DES.  RHC showed mild to moderate pulmonary hypertension.   She felt better after starting ambrisentan and Adcirca.  I next started her on selexipag.  She developed diarrhea/vomiting at 200 mcg bid and got severe leg cramps in addition when it was increased to 400 mcg bid.  She finally stopped selexipag and symptoms all resolved. She also did not tolerate generic tadalafil well and is back on brand-name Adcirca.  Most recently, Tyvaso started with no problems.   Transplant evaluation is currently on hold as she has been doing better symptomatically.   She had a recent flare of sarcoidosis/bronchitis and started on prednisone.  This has mostly resolved.  Currently doing well, no dyspnea walking on flat ground.  She did well on 6 minute walk today.  No chest pain.  No orthopnea/PND.  No lightheadedness.    Labs (9/17): NA negative, ESR 112, anti-dsDNA negative, anti-CCP negative, RF < 14.   ACE 136.  Labs (10/17): K 4.1, creatinine 0.8 Labs (11/17): K 3.8, creatinine 0.91 Labs (3/18): BNP 16 Labs (5/18): K 4.6, creatinine 0.74 Labs (7/18): creatinine 0.79 Labs (9/18): K 4.3, creatinine 0.76 Labs (10/18): LDL 62, HDL 40  6 minute walk (10/17): 305 m.  6 minute walk (1/18): 305 m 6 minute walk (3/18): 270 m 6 minute walk (6/18): 302 m 6 minute walk (9/18): 396 m 6 minute walk (2/19): 213 m (could have kept going but was stopped due to decreased oxygen saturation).  6 minute walk (4/19): 396 m 6 minute walk (7/19): 381 m  PMH: 1. GERD 2. Pulmonary hypertension: Suspect Group 5 PH related to sarcoidosis.  - CTA chest (8/17) with no PE but mediastinal/hilar LAN, cystic lung changes, subpleural consolidation, patchy ground glass.  - Echo (8/17): EF 55-60%, D-shaped interventricular septum, PASP 60 mmHg. - ANA negative, ESR 112, anti-dsDNA negative, anti-CCP negative, RF < 14.  - RHC (10/17): mean RA 3, PA 59/22 mean 36, mean PCWP 5, CI 2.92, PVR 7.4 WU.  - PFTs (10/17): Restrictive.  - RHC (4/18): mean RA 4, PA mean 32, mean PCWP 8, CI 2.9, PVR 5.2 WU.  - Did not tolerate selexipag.  - Echo (5/19): EF 60-65%, mild LVH, mild MR, RV normal size and systolic function, PASP 37 mmHg.  3. Sarcoidosis: Diagnosed with ocular sarcoidosis in 1997. Significant pulmonary involvement noted in 8/17.  Lung biopsy positive.  - Cardiac MRI (5/18): EF 66%, normal RV size with low normal RV systolic function (EF 78%),  no LGE (no evidence for cardiac sarcoidosis).  - CT chest 3/19 with stable pulmonary fibrotic changes related to sarcoidosis.  - PFTs (4/19): FVC 66%, FEV1 69%, ratio 103%, DLCO 37%, TLC 62% => moderate -severe mixed obstruction/restriction.  4. Chronic hypoxemic respiratory failure: Home oxygen 5. Childhood asthma. 6. CAD: LHC (4/18) with 80% LAD stenosis => DES to LAD.   Social History   Socioeconomic History  . Marital status: Married    Spouse name: Not on file  .  Number of children: Not on file  . Years of education: Not on file  . Highest education level: Not on file  Occupational History  . Not on file  Social Needs  . Financial resource strain: Not on file  . Food insecurity:    Worry: Not on file    Inability: Not on file  . Transportation needs:    Medical: Not on file    Non-medical: Not on file  Tobacco Use  . Smoking status: Former Smoker    Packs/day: 0.25    Years: 10.00    Pack years: 2.50    Types: Cigarettes    Last attempt to quit: 01/11/2011    Years since quitting: 6.7  . Smokeless tobacco: Never Used  Substance and Sexual Activity  . Alcohol use: No  . Drug use: No  . Sexual activity: Not on file  Lifestyle  . Physical activity:    Days per week: Not on file    Minutes per session: Not on file  . Stress: Not on file  Relationships  . Social connections:    Talks on phone: Not on file    Gets together: Not on file    Attends religious service: Not on file    Active member of club or organization: Not on file    Attends meetings of clubs or organizations: Not on file    Relationship status: Not on file  Other Topics Concern  . Not on file  Social History Narrative   Patient denies any bird or mold exposure. Currently works with exposure to inhaled perfumes and chemicals. 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 Previous exposure to inhaled dust while working in [employer]. Denies any IV drug use or previous incarceration. No known tuberculosis exposure. She has never lived nor volunteered at a homeless shelter.   Family History  Problem Relation Age of Onset  . Hypertension Mother   . Hypertension Father   . Sarcoidosis Maternal Aunt   . Rheumatologic disease Neg Hx    ROS: All systems reviewed and negative except as per HPI.   Current Outpatient Medications  Medication Sig Dispense Refill  . ADCIRCA 20 MG tablet Take 2 tablets (40 mg total)  by mouth daily. 180 tablet 3  . albuterol (PROVENTIL HFA;VENTOLIN HFA) 108 (90 Base) MCG/ACT inhaler Inhale 2 puffs into the lungs every 4 (four) hours as needed for wheezing or shortness of breath. 1 Inhaler 5  . aspirin 81 MG tablet Take 1 tablet (81 mg total) by mouth daily. 30 tablet 0  . atorvastatin (LIPITOR) 40 MG tablet TAKE 1 TABLET BY MOUTH EVERY DAY 30 tablet 11  . budesonide-formoterol (SYMBICORT) 160-4.5 MCG/ACT inhaler Inhale 2 puffs into the lungs 2 (two) times daily. 1 Inhaler 1  . esomeprazole (NEXIUM) 40 MG capsule TAKE 1 CAPSULE BY MOUTH EVERY DAY 30 capsule 3  . famotidine (PEPCID) 40 MG tablet Take 1 tablet (40 mg total) by mouth  daily. 30 tablet 2  . ferrous sulfate 325 (65 FE) MG tablet Take 1 tablet (325 mg total) by mouth 2 (two) times daily with a meal. 60 tablet 2  . ipratropium-albuterol (DUONEB) 0.5-2.5 (3) MG/3ML SOLN Take 3 mLs by nebulization every 6 (six) hours as needed. 360 mL 11  . OXYGEN 2-3 lpm 24/7  AHC    . predniSONE (DELTASONE) 2.5 MG tablet Take 1 tablet (2.5 mg total) by mouth daily with breakfast. 60 tablet 1  . ranitidine (ZANTAC) 150 MG tablet TAKE 1 TABLET (150 MG TOTAL) BY MOUTH AT BEDTIME. 30 tablet 3  . Treprostinil (TYVASO) 0.6 MG/ML SOLN Inhale 18 mcg into the lungs 4 (four) times daily.    [name] ambrisentan (LETAIRIS) 10 MG tablet Take 1 tablet (10 mg total) by mouth daily. 30 tablet 11  . predniSONE (DELTASONE) 2.5 MG tablet TAKE 1 TABLET (2.5 MG TOTAL) BY MOUTH DAILY WITH BREAKFAST. 30 tablet 2   No current facility-administered medications for this encounter.    BP 108/64   Pulse 84   Wt 141 lb (64 kg)   SpO2 92% Comment: 2.5 L  BMI 27.54 kg/m  General: NAD Neck: JVP 7-8 cm, no thyromegaly or thyroid nodule.  Lungs: Clear to auscultation bilaterally with normal respiratory effort. CV: Nondisplaced PMI.  Heart regular S1/S2, no S3/S4, no murmur.  No peripheral edema.  No carotid bruit.  Normal pedal pulses.  Abdomen: Soft, nontender, no  hepatosplenomegaly, no distention.  Skin: Intact without lesions or rashes.  Neurologic: Alert and oriented x 3.  Psych: Normal affect. Extremities: No clubbing or cyanosis.  HEENT: Normal.   Assessment/Plan: 1. Sarcoidosis: Ocular and lung involvement, biopsy-proven.  Cardiac MRI in 5/18 did not show evidence for cardiac sarcoidosis.  She is on prednisone and home oxygen. She has been evaluated at [facility] for lung transplant, this is currently on hold as she has clinically improved on PH meds.   - Follows with Dr. [name].  2. Pulmonary hypertension: Pulmonary arterial hypertension. Suspect mixed group 1 and group 5 PH. CTA chest showed no PE but did show changes of sarcoidosis.  PFTs were restrictive.  Echo showed RV strain and elevated PA pressure.  PAH was confirmed by [place] in 10/17 with PVR 7.4 WU.  Serologic workup was negative.  [place] in 4/18 showed mild to moderate residual pulmonary hypertension. She has felt like generic tadalafil has not been as effective as brand name Adcirca. 6 minute walk today is stable compared to prior.  She is tolerating Tyvaso (unable to tolerate Malvin Johns).  Echo in 5/19 showed PASP 37 mmHg (improved).    - Continue ambrisentan.   - Continue brand-name Adcirca 40 mg daily.  - Continue Tyvaso.   - BNP today.  3. CAD: Found incidentally on cath 4/18 done as part of workup for lung transplantation.  80% LAD stenosis, treated with DES to LAD.   No chest pain.  - Continue ASA 81 mg daily.   - Continue atorvastatin, good lipids in 10/18.   Followup in 3 months.   Loralie Champagne 10/08/2017

## 2017-10-09 NOTE — Progress Notes (Signed)
RAD[facility] Informed Consent           Subject Name:  Erin Zavala   Subject met inclusion and exclusion criteria.  The informed consent form, study requirements and expectations were reviewed with the subject and questions and concerns were addressed prior to the signing of the consent form.  The subject verbalized understanding of the trial requirements.  The subject agreed to participate in the [facility] trial and signed the informed consent.  The informed consent was obtained prior to performance of any protocol-specific procedures for the subject.  A copy of the signed informed consent was given to the subject and a copy was placed in the subject's medical record.   [place] Chalmers, Research Assistant 10/08/2017 10:07 a.m.

## 2017-10-14 ENCOUNTER — Telehealth: Payer: Self-pay | Admitting: Emergency Medicine

## 2017-10-14 MED ORDER — IPRATROPIUM-ALBUTEROL 0.5-2.5 (3) MG/3ML IN SOLN: 3.0000 mL | Freq: Four times a day (QID) | RESPIRATORY_TRACT | 11 refills | Status: DC | PRN

## 2017-10-14 NOTE — Telephone Encounter (Signed)
Pt. Is returning call. CB is [phone]

## 2017-10-14 NOTE — Telephone Encounter (Signed)
Attempted to contact pt. I did not receive an answer. There was no option for me to leave a message. Will try back.

## 2017-10-14 NOTE — Telephone Encounter (Signed)
  Called and spoke with pt who stated she has not yet received her duoneb solution in the mail.  Asked pt if she wanted me to send it to her regular pharmacy.  Pt stated that would be fine. Verified pt's pharmacy and sent script there for pt.  Nothing further needed.

## 2017-10-21 ENCOUNTER — Other Ambulatory Visit: Payer: Self-pay | Admitting: Emergency Medicine

## 2017-10-21 ENCOUNTER — Other Ambulatory Visit: Payer: Self-pay | Admitting: Acute Care

## 2017-11-01 ENCOUNTER — Other Ambulatory Visit (HOSPITAL_COMMUNITY): Payer: Self-pay | Admitting: Cardiology

## 2017-11-04 ENCOUNTER — Encounter: Payer: Self-pay | Admitting: *Deleted

## 2017-11-04 ENCOUNTER — Ambulatory Visit: Payer: Medicaid Other | Admitting: Emergency Medicine

## 2017-11-04 ENCOUNTER — Encounter: Payer: Self-pay | Admitting: Emergency Medicine

## 2017-11-04 DIAGNOSIS — J9611 Chronic respiratory failure with hypoxia: Secondary | ICD-10-CM

## 2017-11-04 DIAGNOSIS — J45909 Unspecified asthma, uncomplicated: Secondary | ICD-10-CM | POA: Diagnosis not present

## 2017-11-04 DIAGNOSIS — I272 Pulmonary hypertension, unspecified: Secondary | ICD-10-CM

## 2017-11-04 MED ORDER — AZITHROMYCIN 250 MG PO TABS: ORAL_TABLET | ORAL | 0 refills | Status: AC

## 2017-11-04 MED ORDER — PREDNISONE 10 MG PO TABS: ORAL_TABLET | ORAL | 0 refills | Status: DC

## 2017-11-04 NOTE — Progress Notes (Signed)
Subjective:    Patient ID: Erin Zavala, female    DOB: 10-05-1965, 52 y.o.   MRN: [id number]  HPI 52 year old patient has been followed in our office by Dr. Ashok Cordia for sarcoidosis with pulmonary involvement, ocular involvement.  Is also followed in cardiology clinic for associated pulmonary arterial hypertension.  She is being treated with Ralene Cork.  Plans are in place now for her to initiate Tyvaso.  She follows with Dr. [name].  She has chronic hypoxemic respiratory failure and uses 1-4 liters per minute. Keeps track of her Spo2 and stays at goal. She needs a new ophthalmologist. She is currently on 2.59m prednisone. She believes that her breathing is doing well. Her vision has some ups and downs, some conjunctivitis sx. She remains on flovent   ROV 10/03/17 --patient has a history of sarcoidosis with pulmonary and ocular involvement, associated secondary pulmonary hypertension on Adcirca, Latera's, Tyvaso.  Associated chronic hypoxemic respiratory failure.  She was seen about 2 weeks ago with increased cough, mucus both from her head and her chest.  She was treated with a brief taper of prednisone back down to 2.5 mg daily - will be back to that dose in a couple days. pepcid was added to nexium. She was on flovent, changed temporarily to symbicort.   PFT from 06/26/2017 reviewed.  This shows moderately severe mixed obstruction and restriction with restricted lung volumes and a significantly decreased diffusion capacity.  She underwent a CT scan of the chest on 06/05/2017 that I reviewed.  This showed stable chronic changes of sarcoidosis without any interval progression.  Acute OV 11/04/17 --52year old woman with a history of sarcoidosis, associated significant obstructive and restrictive lung disease.  She has lung and ocular involvement.  She has hypoxemia with associated secondary pulmonary hypertension, currently managed on Adcirca, Letaris, Tyvaso, oxygen at 2-3 liters per minute.  She is  on chronic prednisone at 2.5 mg daily.  She is on Symbicort, uses DuoNeb rarely until this recent illness. Currently taking about every 6 hours.   She presents today stating that she has had trouble since last Wednesday, was exposed to some AirMail sacks to which she has been sensitive before. Developed more sneezing, wheeze, dyspnea. Some raspy voice. ? Whether this is exposure related or whether she has a URI. She is taking some mucinex DM.  Cough prod of some yellow, rare plugs.    Review of Systems  Past Medical History:  Diagnosis Date  . Asthma    as a child  . Cough   . Dyspnea   . GERD (gastroesophageal reflux disease)   . Migraines   . Oxygen dependent   . [place]monary hypertension [place]   . Sarcoidosis      Family History  Problem Relation Age of Onset  . Hypertension Mother   . Hypertension Father   . Sarcoidosis Maternal Aunt   . Rheumatologic disease Neg Hx      Social History   Socioeconomic History  . Marital status: Married    Spouse name: Not on file  . Number of children: Not on file  . Years of education: Not on file  . Highest education level: Not on file  Occupational History  . Not on file  Social Needs  . Financial resource strain: Not on file  . Food insecurity:    Worry: Not on file    Inability: Not on file  . Transportation needs:    Medical: Not on file    Non-medical: Not on  file  Tobacco Use  . Smoking status: Former Smoker    Packs/day: 0.25    Years: 10.00    Pack years: 2.50    Types: Cigarettes    Last attempt to quit: 01/11/2011    Years since quitting: 6.8  . Smokeless tobacco: Never Used  Substance and Sexual Activity  . Alcohol use: No  . Drug use: No  . Sexual activity: Not on file  Lifestyle  . Physical activity:    Days per week: Not on file    Minutes per session: Not on file  . Stress: Not on file  Relationships  . Social connections:    Talks on phone: Not on file    Gets together: Not on file    Attends  religious service: Not on file    Active member of club or organization: Not on file    Attends meetings of clubs or organizations: Not on file    Relationship status: Not on file  . Intimate partner violence:    Fear of current or ex partner: Not on file    Emotionally abused: Not on file    Physically abused: Not on file    Forced sexual activity: Not on file  Other Topics Concern  . Not on file  Social History Narrative   Patient denies any bird or mold exposure. Currently works with exposure to inhaled perfumes and chemicals. 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 Previous exposure to inhaled dust while working in [employer]. Denies any IV drug use or previous incarceration. No known tuberculosis exposure. She has never lived nor volunteered at a homeless shelter.     Allergies  Allergen Reactions  . No Known Allergies      Outpatient Medications Prior to Visit  Medication Sig Dispense Refill  . ADCIRCA 20 MG tablet Take 2 tablets (40 mg total) by mouth daily. 180 tablet 3  . albuterol (PROVENTIL HFA;VENTOLIN HFA) 108 (90 Base) MCG/ACT inhaler Inhale 2 puffs into the lungs every 4 (four) hours as needed for wheezing or shortness of breath. 1 Inhaler 5  . ambrisentan (LETAIRIS) 10 MG tablet Take 1 tablet (10 mg total) by mouth daily. 30 tablet 11  . aspirin 81 MG tablet Take 1 tablet (81 mg total) by mouth daily. 30 tablet 0  . atorvastatin (LIPITOR) 40 MG tablet TAKE 1 TABLET BY MOUTH EVERY DAY 30 tablet 11  . esomeprazole (NEXIUM) 40 MG capsule TAKE 1 CAPSULE BY MOUTH EVERY DAY 30 capsule 3  . famotidine (PEPCID) 40 MG tablet Take 1 tablet (40 mg total) by mouth daily. 30 tablet 2  . ferrous sulfate 325 (65 FE) MG tablet TAKE 1 TABLET (325 MG TOTAL) BY MOUTH 2 (TWO) TIMES DAILY WITH A MEAL. 60 tablet 2  . ipratropium-albuterol (DUONEB) 0.5-2.5 (3) MG/3ML SOLN Take 3 mLs by nebulization every 6 (six) hours as  needed. 360 mL 11  . OXYGEN 2-3 lpm 24/7  [facility]    . predniSONE (DELTASONE) 2.5 MG tablet TAKE 1 TABLET (2.5 MG TOTAL) BY MOUTH DAILY WITH BREAKFAST. 30 tablet 2  . ranitidine (ZANTAC) 150 MG tablet TAKE 1 TABLET (150 MG TOTAL) BY MOUTH AT BEDTIME. 30 tablet 3  . SYMBICORT 160-4.5 MCG/ACT inhaler TAKE 2 PUFFS BY MOUTH TWICE A DAY 10.2 Inhaler 1  . Treprostinil (TYVASO) 0.6 MG/ML SOLN Inhale 18 mcg into the lungs 4 (four) times daily.    . predniSONE (DELTASONE) 2.5 MG tablet  Take 1 tablet (2.5 mg total) by mouth daily with breakfast. 60 tablet 1   No facility-administered medications prior to visit.         Objective:   Physical Exam Vitals:   11/04/17 1628  BP: 124/68  Pulse: (!) 107  SpO2: 99%  Weight: 133 lb (60.3 kg)  Height: 5' (1.524 m)   Gen: Pleasant, well-nourished, in no distress but looks ill  ENT: No lesions,  mouth clear,  oropharynx clear, some nasal congestion and sneezing  Neck: No JVD, some mild UA noise  Lungs: No use of accessory muscles, distant but no wheeze  Cardiovascular: RRR, heart sounds normal, no murmur or gallops, no peripheral edema  Musculoskeletal: No deformities, no cyanosis or clubbing  Neuro: alert, non focal  Skin: Warm, no lesions or rash     Assessment & Plan:  Acute asthmatic bronchitis Flaring symptoms, unclear whether this was precipitated by an exposure or possibly URI.  Her symptoms are more upper airway in nature, accompanied by rhinitis, sneezing.  Productive cough with some yellow sputum, plugs.  Will treat as a bronchitis, acute flare, short prednisone course in absence of loud wheezing.  Please take prednisone taper as directed, down to your usual dose of 2.14m daily Take azithromycin as directed until completely gone.  Try using Tylenol Cold and Flu temporarily to help with symptom control. Take as directed.  Please continue your Symbicort and DuoNeb as you are taking them   Chronic respiratory failure with hypoxia  (HCC) Continue your oxygen 2-3 L/min at all times  Pulmonary hypertension ([place] Continue your TLarena Soxas you have been taking them   RBaltazar Apo MD, PhD 11/04/2017, 4:56 PM [facility] [phone] if no answer [phone]

## 2017-11-04 NOTE — Assessment & Plan Note (Signed)
Continue your oxygen 2-3 L/min at all times

## 2017-11-04 NOTE — Patient Instructions (Addendum)
Please take prednisone taper as directed, down to your usual dose of 2.104m daily Take azithromycin as directed until completely gone.  Try using Tylenol Cold and Flu temporarily to help with symptom control. Take as directed.  Please continue your Symbicort and DuoNeb as you are taking them  Continue your oxygen 2-3 L/min at all times Continue your Tyvaso, AJoanie Coddington Letaris as you have been taking them  Call our office if you are not feeling better in a week.  Follow with Dr BLamonte Sakaiin 6 months or sooner if you have any problems

## 2017-11-04 NOTE — Assessment & Plan Note (Signed)
Flaring symptoms, unclear whether this was precipitated by an exposure or possibly URI.  Her symptoms are more upper airway in nature, accompanied by rhinitis, sneezing.  Productive cough with some yellow sputum, plugs.  Will treat as a bronchitis, acute flare, short prednisone course in absence of loud wheezing.  Please take prednisone taper as directed, down to your usual dose of 2.37m daily Take azithromycin as directed until completely gone.  Try using Tylenol Cold and Flu temporarily to help with symptom control. Take as directed.  Please continue your Symbicort and DuoNeb as you are taking them

## 2017-11-04 NOTE — Assessment & Plan Note (Signed)
Continue your Larena Sox as you have been taking them

## 2017-11-05 ENCOUNTER — Other Ambulatory Visit: Payer: Self-pay | Admitting: Emergency Medicine

## 2017-11-08 ENCOUNTER — Telehealth: Payer: Self-pay | Admitting: Emergency Medicine

## 2017-11-08 NOTE — Telephone Encounter (Signed)
Spoke with patient, patient requests a note for work, states the tylenol cold and cough suggested by MD Byrum works well for her mucous but has  Not been effective for her cough. States her cough is getting worse and would like something for cough.   RB please advise if we can send in something for her cough and if we can print her a work note to return on Tuesday. Thanks.

## 2017-11-12 NOTE — Telephone Encounter (Signed)
Per TP: okay to extend work note to return to work tonight 11/12/17.  Glad her symptoms have improved, but if they worsen again she will need to be seen in the office.  Thanks.

## 2017-11-12 NOTE — Telephone Encounter (Signed)
Called and spoke to pt regarding below message.  Pt is requesting that this matter be addressed today. Pt is requesting that her work note be extended to return to work tonight, 11/12/17. Pt stated she missed work last week, due to fatigue and sob caused by her cough.  Pt stated her symptoms have now improved.  TP please advise on work note, as RB is unavailable.

## 2017-11-12 NOTE — Telephone Encounter (Signed)
Letter has been printed and placed upfront for patient to pick up. Patient aware. Nothing further needed at this time.

## 2017-11-25 ENCOUNTER — Other Ambulatory Visit (HOSPITAL_COMMUNITY): Payer: Self-pay | Admitting: Cardiology

## 2017-11-25 ENCOUNTER — Other Ambulatory Visit: Payer: Self-pay | Admitting: Emergency Medicine

## 2017-12-02 ENCOUNTER — Telehealth (HOSPITAL_COMMUNITY): Payer: Self-pay | Admitting: Pharmacist

## 2017-12-02 NOTE — Telephone Encounter (Signed)
Assist patient assistance approved for Adcirca and Tyvaso for up to 1 calendar year.   Ruta Hinds. Velva Harman, PharmD, BCPS, CPP Clinical Pharmacist Phone: [phone] 12/02/2017 3:29 PM

## 2017-12-03 ENCOUNTER — Other Ambulatory Visit (HOSPITAL_COMMUNITY): Payer: Self-pay | Admitting: Cardiology

## 2017-12-03 MED ORDER — AMBRISENTAN 10 MG PO TABS: 10.0000 mg | ORAL_TABLET | Freq: Every day | ORAL | 11 refills | Status: DC

## 2017-12-03 NOTE — Telephone Encounter (Signed)
Faxed refill request returned via fax

## 2017-12-04 NOTE — Progress Notes (Signed)
_0  ID: Erin Zavala, female    DOB: Mar 23, 1965, 52 y.o.   MRN: [id number]  Chief Complaint  Patient presents with  . Acute Visit    RB pt, feeling worse over 3-4 days    Referring provider: [name], MD  HPI:  52 year old female followed in our office for sarcoidosis with pulmonary involvement/ocular involvement, followed in cardiology clinic for the pulmonary arterial hypertension (treated with [name])  PMH: PAH - Dr. [name] Smoker/ Smoking History:  Maintenance:  2.41m daily prednisone, Symbicort 160 Pt of: Dr. [name] 12/05/2017  - Visit   52year old female patient presenting today for acute visit.  Patient was last seen last month and treated for bronchitis.  Patient reports that she felt much better after the Z-Pak and prednisone.  But has worsened over the last 3 to 4 days.  Patient reports that it feels like it is a head cold, congestion, cough.  Patient has been unable to take a Tyvaso for 3 days.  Patient has been able to take her Adcirca and Letairis.  Patient has not followed up with her cardiologist regarding this.  Patient has been adherent to her daily prednisone of 2.5 mg.  Patient has been adherent to her Symbicort 160.  Patient does feel that she is had increased oxygen needs.  Patient presented today with oxygen saturations 86% on 2 L.  Patient was bumped up to 3 L and oxygen saturations resumed to 93%.     Tests:  09/20/2017-chest x-ray- relatively unchanged chronic interstitial lung disease secondary to sarcoidosis 06/05/2017-CT chest high-res- mediastinal, hilar, pulmonary parenchymal fibrotic changes of sarcoid, stable, PAH  07/23/2017-echocardiogram-LV ejection fraction 60 to 65%, PA P pressure 37 >>>Normal systolic function PAP pressure has decreased from 60-37 from 11/02/2015 echocardiogram  06/26/2017-pulmonary function test- FVC 1.1, FVC 1.61 (66% predicted), ratio 84, FEV1 69%, no significant bronchodilator response, DLCO  34  Chart Review:     Specialty Problems      Pulmonary Problems   Pulmonary sarcoidosis (HCC)   Childhood asthma   Acute bronchitis   Pulmonary hypertension ([place]    2/2 sarcoidosis; uses 2LNC O2 with exertion.      Sinus congestion   Chronic respiratory failure with hypoxia (HCC)   Acute asthmatic bronchitis    09/20/2017  After extensive coaching inhaler device  effectiveness =    50% from a baseline of 25% > changed flovent to symb 160 2bid and provided neb for backup duoneb       Sinusitis, acute, maxillary      Allergies  Allergen Reactions  . No Known Allergies     Immunization History  Administered Date(s) Administered  . Influenza,inj,Quad PF,6+ Mos 02/28/2016, 12/18/2016  . PPD Test 02/28/2016  . Pneumococcal Conjugate-13 11/29/2016  . Pneumococcal Polysaccharide-23 11/03/2015   >>> Patient needs flu vaccine when stable  Past Medical History:  Diagnosis Date  . Asthma    as a child  . Cough   . Dyspnea   . GERD (gastroesophageal reflux disease)   . Migraines   . Oxygen dependent   . Pulmonary hypertension ([place]   . Sarcoidosis     Tobacco History: Social History   Tobacco Use  Smoking Status Former Smoker  . Packs/day: 0.25  . Years: 10.00  . Pack years: 2.50  . Types: Cigarettes  . Last attempt to quit: 01/11/2011  . Years since quitting: 6.9  Smokeless Tobacco Never Used   Counseling given: Yes  Continue to not smoke  Outpatient Encounter Medications as of 12/05/2017  Medication Sig  . ADCIRCA 20 MG tablet Take 2 tablets (40 mg total) by mouth daily.  [name] albuterol (PROVENTIL HFA;VENTOLIN HFA) 108 (90 Base) MCG/ACT inhaler Inhale 2 puffs into the lungs every 4 (four) hours as needed for wheezing or shortness of breath.  [name] aspirin 81 MG tablet Take 1 tablet (81 mg total) by mouth daily.  [name] atorvastatin (LIPITOR) 40 MG tablet TAKE 1 TABLET BY MOUTH EVERY DAY  . esomeprazole (NEXIUM) 40 MG capsule TAKE 1 CAPSULE BY MOUTH EVERY DAY  .  famotidine (PEPCID) 40 MG tablet Take 1 tablet (40 mg total) by mouth daily.  . ferrous sulfate 325 (65 FE) MG tablet TAKE 1 TABLET (325 MG TOTAL) BY MOUTH 2 (TWO) TIMES DAILY WITH A MEAL.  [name] ipratropium-albuterol (DUONEB) 0.5-2.5 (3) MG/3ML SOLN Take 3 mLs by nebulization every 6 (six) hours as needed.  . OXYGEN 2-3 lpm 24/7  [facility]  . predniSONE (DELTASONE) 2.5 MG tablet TAKE 1 TABLET (2.5 MG TOTAL) BY MOUTH DAILY WITH BREAKFAST.  . ranitidine (ZANTAC) 150 MG tablet TAKE 1 TABLET (150 MG TOTAL) BY MOUTH AT BEDTIME.  . SYMBICORT 160-4.5 MCG/ACT inhaler TAKE 2 PUFFS BY MOUTH TWICE A DAY  . Treprostinil (TYVASO) 0.6 MG/ML SOLN Inhale 18 mcg into the lungs 4 (four) times daily.  . [DISCONTINUED] ambrisentan (LETAIRIS) 10 MG tablet Take 1 tablet (10 mg total) by mouth daily.  [name] amoxicillin-clavulanate (AUGMENTIN) 875-125 MG tablet Take 1 tablet by mouth 2 (two) times daily.  . predniSONE (DELTASONE) 10 MG tablet 4 tabs for 3 days, then 3 tabs for 3 days, 2 tabs for 3 days, then 1 tab for 3 days, then resume 2.67m daily dose  . [DISCONTINUED] predniSONE (DELTASONE) 10 MG tablet 3 tablets for 3 days, 2 tablets for 3 days, 1 tablet for 3 days (Patient not taking: Reported on 12/05/2017)  . [EXPIRED] levalbuterol (XOPENEX) nebulizer solution 0.63 mg    No facility-administered encounter medications on file as of 12/05/2017.      Review of Systems  Review of Systems  Constitutional: Positive for appetite change (no appetite) and fatigue. Negative for chills, fever and unexpected weight change (lost some weight d/t loss of appetite ).  HENT: Positive for congestion, postnasal drip and sinus pressure. Negative for ear pain and sinus pain.   Respiratory: Positive for cough (productive - clear/yellow), shortness of breath and wheezing (worse at night ). Negative for chest tightness.        Increased oxygen needs recently, started on 12/01/17  Cardiovascular: Negative for chest pain and palpitations.    Gastrointestinal: Negative for blood in stool, diarrhea, nausea and vomiting.  Genitourinary: Negative for dysuria, frequency and urgency.  Musculoskeletal: Negative for arthralgias.  Skin: Negative for color change.  Allergic/Immunologic: Negative for environmental allergies and food allergies.  Neurological: Negative for dizziness, light-headedness and headaches.  Psychiatric/Behavioral: Negative for dysphoric mood. The patient is not nervous/anxious.   All other systems reviewed and are negative.    Physical Exam  BP 114/66 (BP Location: Right Arm, Cuff Size: Normal)   Pulse 81   Temp (!) 97 F (36.1 C) (Oral)   Ht 5' (1.524 m)   Wt 134 lb (60.8 kg)   LMP 12/05/2017   SpO2 92% Comment: arrived on 2lpm _0 %  BMI 26.17 kg/m   Wt Readings from Last 5 Encounters:  12/05/17 134 lb (60.8 kg)  11/04/17 133 lb (60.3 kg)  10/08/17 141 lb (64 kg)  10/03/17 141 lb (64 kg)  09/20/17 136 lb 9.6 oz (62 kg)   3 L via nasal cannula  Physical Exam  Constitutional: She is oriented to person, place, and time and well-developed, well-nourished, and in no distress. No distress.  HENT:  Head: Normocephalic and atraumatic.  Right Ear: Hearing, tympanic membrane, external ear and ear canal normal.  Left Ear: Hearing, tympanic membrane, external ear and ear canal normal.  Nose: Mucosal edema and rhinorrhea present. Right sinus exhibits maxillary sinus tenderness. Right sinus exhibits no frontal sinus tenderness. Left sinus exhibits maxillary sinus tenderness. Left sinus exhibits no frontal sinus tenderness.  Mouth/Throat: Uvula is midline and oropharynx is clear and moist. No oropharyngeal exudate.  Eyes: Pupils are equal, round, and reactive to light.  Neck: Normal range of motion. Neck supple. No JVD present.  Cardiovascular: Normal rate, regular rhythm and normal heart sounds.  Pulmonary/Chest: Effort normal. No accessory muscle usage. No respiratory distress. She has no decreased breath  sounds. She has wheezes (Inspiratory and expiratory wheeze). She has no rhonchi.  Musculoskeletal: Normal range of motion. She exhibits no edema.  Lymphadenopathy:    She has no cervical adenopathy.  Neurological: She is alert and oriented to person, place, and time. Gait normal.  Skin: Skin is warm and dry. She is not diaphoretic. No erythema.  Psychiatric: Mood, memory, affect and judgment normal.  Nursing note and vitals reviewed.     Lab Results:  CBC    Component Value Date/Time   WBC 7.7 12/05/2016 1219   RBC 4.00 12/05/2016 1219   HGB 8.6 Repeated and verified X2. (L) 12/05/2016 1219   HCT 28.4 (L) 12/05/2016 1219   PLT 419.0 (H) 12/05/2016 1219   MCV 71.1 (L) 12/05/2016 1219   MCH 29.0 01/13/2016 1245   MCHC 30.2 12/05/2016 1219   RDW 19.7 (H) 12/05/2016 1219   LYMPHSABS 1.1 09/26/2016 1707   MONOABS 0.7 09/26/2016 1707   EOSABS 0.3 09/26/2016 1707   BASOSABS 0.1 09/26/2016 1707    BMET    Component Value Date/Time   NA 138 11/29/2016 1702   K 4.3 11/29/2016 1702   CL 106 11/29/2016 1702   CO2 24 11/29/2016 1702   GLUCOSE 99 11/29/2016 1702   BUN 10 11/29/2016 1702   CREATININE 0.76 11/29/2016 1702   CALCIUM 9.6 11/29/2016 1702   GFRNONAA >60 05/18/2016 1109   GFRAA >60 05/18/2016 1109    BNP    Component Value Date/Time   BNP 17.6 10/08/2017 1036    ProBNP No results found for: PROBNP  Imaging: Dg Chest 2 View  Result Date: 12/05/2017 CLINICAL DATA:  Cough, shortness of breath. EXAM: CHEST - 2 VIEW COMPARISON:  Radiographs of September 20, 2017. FINDINGS: The heart size and mediastinal contours are within normal limits. No pneumothorax or pleural effusion is noted. Stable coarse interstitial densities are noted throughout both lungs consistent with history of sarcoidosis. No significant changes noted compared to prior exam. The visualized skeletal structures are unremarkable. IMPRESSION: Stable chronic findings consistent with sarcoidosis. No significant  change compared to prior exam. Electronically Signed   By: Marijo Conception, M.D.   On: 12/05/2017 11:48      Assessment & Plan:   Pleasant 52 year old patient seen office visit today.  Suspect patient has acute sinusitis vs acute bronchitis.  Will treat as flare.  Cover with antibiotics for sinusitis.  Will have patient follow-up with our office in 4 to 6 weeks.  Patient also contact cardiology to update them  on her medications as she is been unable to take her Tyvaso for the last 3 days.  Sinusitis, acute, maxillary  Augmentin >>> Take 1 875-125 mg tablet every 12 hours for the next 7 days >>> Take with food  Prednisone 40m tablet  >>>4 tabs for 3 days, then 3 tabs for 3 days, 2 tabs for 3 days, then 1 tab for 3 days, then stop >>>take with food  >>>take in the morning  >>> Resume daily 2.5 mg dose after completing this taper  Follow up with Dr. MOleh Geninoffice regarding [place] medications   4-6 weeks follow up   Acute asthmatic bronchitis Xopenex breathing treatment in office today  Chest x-ray today  Augmentin >>> Take 1 875-125 mg tablet every 12 hours for the next 7 days >>> Take with food  Prednisone 132mtablet  >>>4 tabs for 3 days, then 3 tabs for 3 days, 2 tabs for 3 days, then 1 tab for 3 days, then stop >>>take with food  >>>take in the morning  >>> Resume daily 2.5 mg dose after completing this taper  Continue oxygen therapy as prescribed >>> Contact our office his symptoms are worsening her oxygen needs are increasing >>> Ensure oxygenation remains greater than 90%  Continue Symbicort 160 >>> 2 puffs in the morning right when you wake up, rinse out your mouth after use, 12 hours later 2 puffs, rinse after use >>> Take this daily, no matter what >>> This is not a rescue inhaler   4-6 weeks follow up   Pulmonary hypertension ([place] Follow up with Dr. McOleh Geninffice regarding [place] medications   Continue Adcirca Continue Letairis Continue  Tyvaso  Continue oxygen therapy as prescribed >>> Contact our office his symptoms are worsening her oxygen needs are increasing >>> Ensure oxygenation remains greater than 90%    Pulmonary sarcoidosis (HCC) Chest x-ray today  Prednisone 1052mablet  >>>4 tabs for 3 days, then 3 tabs for 3 days, 2 tabs for 3 days, then 1 tab for 3 days, then stop >>>take with food  >>>take in the morning  >>> Resume daily 2.5 mg dose after completing this taper  Continue oxygen therapy as prescribed >>> Contact our office his symptoms are worsening her oxygen needs are increasing >>> Ensure oxygenation remains greater than 90%  4-6 weeks follow up      BriLauraine RinneP 12/05/2017

## 2017-12-05 ENCOUNTER — Encounter: Payer: Self-pay | Admitting: Pulmonary Disease

## 2017-12-05 ENCOUNTER — Ambulatory Visit (INDEPENDENT_AMBULATORY_CARE_PROVIDER_SITE_OTHER)
Admission: RE | Admit: 2017-12-05 | Discharge: 2017-12-05 | Disposition: A | Discharge status: Home / Self Care | Payer: Self-pay | Source: Ambulatory Visit | Attending: Pulmonary Disease | Admitting: Pulmonary Disease

## 2017-12-05 ENCOUNTER — Telehealth (HOSPITAL_COMMUNITY): Payer: Self-pay | Admitting: Pharmacist

## 2017-12-05 ENCOUNTER — Ambulatory Visit (INDEPENDENT_AMBULATORY_CARE_PROVIDER_SITE_OTHER): Payer: Self-pay | Admitting: Pulmonary Disease

## 2017-12-05 VITALS — BP 114/66 | HR 81 | Temp 97.0°F | Ht 60.0 in | Wt 134.0 lb

## 2017-12-05 DIAGNOSIS — R0981 Nasal congestion: Secondary | ICD-10-CM

## 2017-12-05 DIAGNOSIS — I272 Pulmonary hypertension, unspecified: Secondary | ICD-10-CM

## 2017-12-05 DIAGNOSIS — D860 Sarcoidosis of lung: Secondary | ICD-10-CM

## 2017-12-05 DIAGNOSIS — J45909 Unspecified asthma, uncomplicated: Secondary | ICD-10-CM

## 2017-12-05 DIAGNOSIS — J0100 Acute maxillary sinusitis, unspecified: Secondary | ICD-10-CM

## 2017-12-05 MED ORDER — AMBRISENTAN 10 MG PO TABS: 10.0000 mg | ORAL_TABLET | Freq: Every day | ORAL | 11 refills | Status: DC

## 2017-12-05 MED ORDER — LEVALBUTEROL HCL 0.63 MG/3ML IN NEBU
0.6300 mg | INHALATION_SOLUTION | Freq: Once | RESPIRATORY_TRACT | Status: AC
  Administered 2017-12-05: 0.63 mg via RESPIRATORY_TRACT

## 2017-12-05 MED ORDER — PREDNISONE 10 MG PO TABS: ORAL_TABLET | ORAL | 0 refills | Status: DC

## 2017-12-05 MED ORDER — AMOXICILLIN-POT CLAVULANATE 875-125 MG PO TABS
1.0000 | ORAL_TABLET | Freq: Two times a day (BID) | ORAL | 0 refills | Status: DC
Start: 2017-12-05 — End: 2018-02-17

## 2017-12-05 NOTE — Assessment & Plan Note (Signed)
  Augmentin >>> Take 1 875-125 mg tablet every 12 hours for the next 7 days >>> Take with food  Prednisone 38m tablet  >>>4 tabs for 3 days, then 3 tabs for 3 days, 2 tabs for 3 days, then 1 tab for 3 days, then stop >>>take with food  >>>take in the morning  >>> Resume daily 2.5 mg dose after completing this taper  Follow up with Dr. MOleh Geninoffice regarding [place] medications   4-6 weeks follow up

## 2017-12-05 NOTE — Telephone Encounter (Signed)
[employer] patient assistance approved for Letairis from [employer].   Ruta Hinds. Velva Harman, PharmD, BCPS, CPP Clinical Pharmacist Phone: [phone] 12/05/2017 2:42 PM

## 2017-12-05 NOTE — Patient Instructions (Addendum)
Xopenex breathing treatment in office today  Chest x-ray today  Augmentin >>> Take 1 875-125 mg tablet every 12 hours for the next 7 days >>> Take with food  Prednisone 40m tablet  >>>4 tabs for 3 days, then 3 tabs for 3 days, 2 tabs for 3 days, then 1 tab for 3 days, then stop >>>take with food  >>>take in the morning  >>> Resume daily 2.5 mg dose after completing this taper  Follow up with Dr. MOleh Geninoffice [place]   Continue Adcirca Continue Letairis Continue Tyvaso  Continue oxygen therapy as prescribed >>> Contact our office his symptoms are worsening her oxygen needs are increasing >>> Ensure oxygenation remains greater than 90%  Continue Symbicort 160 >>> 2 puffs in the morning right when you wake up, rinse out your mouth after use, 12 hours later 2 puffs, rinse after use >>> Take this daily, no matter what >>> This is not a rescue inhaler   4-6 weeks follow up    It is flu season:   >>>Remember to be washing your hands regularly, using hand sanitizer, be careful to use around herself with has contact with people who are sick will increase her chances of getting sick yourself. >>> Best ways to protect herself from the flu: Receive the yearly flu vaccine, practice good hand hygiene washing with soap and also using hand sanitizer when available, eat a nutritious meals, get adequate rest, hydrate appropriately    As of 01/13/2018 we will be moving! [place]   Our new address and phone number will be:  [address] [address]Telephone number: [phone]  Please contact the office if your symptoms worsen or you have concerns that you are not improving.   Thank you for choosing [facility] for your healthcare, and for allowing [facility] partner with you on your healthcare journey. I am thankful to be able to provide care to you today.   BWyn QuakerFNP-C     Home  Oxygen Use, Adult When a medical condition keeps you from getting enough oxygen, your health care provider may instruct you to take extra oxygen at home. Your health care provider will let you know:  When to take oxygen.  For how long to take oxygen.  How quickly oxygen should be delivered (flow rate), in liters per minute (LPM or L/M).  Home oxygen can be given through:  A mask.  A nasal cannula. This is a device or tube that goes in the nostrils.  A transtracheal catheter. This is a small, flexible tube placed in the trachea.  A tracheostomy. This is a surgically made opening in the trachea.  These devices are connected with tubing to an oxygen source, such as:  A tank. Tanks hold oxygen in gas form. They must be replaced when the oxygen is used up.  A liquid oxygen device. This holds oxygen in liquid form. It must be replaced when the oxygen is used up.  An oxygen concentrator machine. This filters oxygen in the room. It uses electricity, so you must have a backup cylinder of oxygen in case the power goes out.  Supplies needed: To use oxygen, you will need:  A mask, nasal cannula, transtracheal catheter, or tracheostomy.  An oxygen tank, a liquid oxygen device, or an oxygen concentrator.  The tape that your health care provider recommends (optional).  If you use a transtracheal catheter  and your prescribed flow rate is 1 LPM or greater, you will also need a humidifier. Risks and complications  Fire. This can happen if the oxygen is exposed to a heat source, flame, or spark.  Injury to skin. This can happen if liquid oxygen touches your skin.  Organ damage. This can happen if you get too little oxygen. How to use oxygen Your health care provider will show you how to use your oxygen device. Follow her or his instructions. They may look something like this: 1. Wash your hands. 2. If you use an oxygen concentrator, make sure it is plugged in. 3. Place one end of the  tube into the port on the tank, device, or machine. 4. Place the mask over your nose and mouth. Or, place the nasal cannula and secure it with tape if instructed. If you use a tracheostomy or transtracheal catheter, connect it to the oxygen source as directed. 5. Make sure the liter-flow setting on the machine is at the level prescribed by your health care provider. 6. Turn on the machine or adjust the knob on the tank or device to the correct liter-flow setting. 7. When you are done, turn off and unplug the machine, or turn the knob to OFF.  How to clean and care for the oxygen supplies Nasal cannula  Clean it with a warm, wet cloth daily or as needed.  Wash it with a liquid soap once a week.  Rinse it thoroughly once or twice a week.  Replace it every 2-4 weeks.  If you have an infection, such as a cold or pneumonia, change the cannula when you get better. Mask  Replace it every 2-4 weeks.  If you have an infection, such as a cold or pneumonia, change the mask when you get better. Humidifier bottle  Wash the bottle between each refill: ? Wash it with soap and warm water. ? Rinse it thoroughly. ? Disinfect it and its top. ? Air-dry it.  Make sure it is dry before you refill it. Oxygen concentrator  Clean the air filter at least twice a week according to directions from your home medical equipment and service company.  Wipe down the cabinet every day. To do this: ? Unplug the unit. ? Wipe down the cabinet with a damp cloth. ? Dry the cabinet. Other equipment  Change any extra tubing every 1-3 months.  Follow instructions from your health care provider about taking care of any other equipment. Safety tips Fire safety tips   Keep your oxygen and oxygen supplies at least 5 ft away from sources of heat, flames, and sparks at all times.  Do not allow smoking near your oxygen. Put up "no smoking" signs in your home.  Do not use materials that can burn (are flammable)  while you use oxygen.  When you go to a restaurant with portable oxygen, ask to be seated in the nonsmoking section.  Keep a Data processing manager close by. Let your fire department know that you have oxygen in your home.  Test your home smoke detectors regularly. General safety tips  If you use an oxygen cylinder, make sure it is in a stand or secured to an object that will not move (fixed object).  If you use liquid oxygen, make sure its container is kept upright.  If you use an oxygen concentrator: ? [employer] movement psychotherapist company. Make sure you are given priority service in the event that your power goes out. ? Avoid using extension cords,  if possible. Follow these instructions at home:  Use oxygen only as told by your health care provider.  Do not use alcohol or other drugs that make you relax (sedating drugs) unless instructed. They can slow down your breathing rate and make it hard to get in enough oxygen.  Know how and when to order a refill of oxygen.  Always keep a spare tank of oxygen. Plan ahead for holidays when you may not be able to get a prescription filled.  Use water-based lubricants on your lips or nostrils. Do not use oil-based products like petroleum jelly.  To prevent skin irritation on your cheeks or behind your ears, tuck some gauze under the tubing. Contact a health care provider if:  You get headaches often.  You have shortness of breath.  You have a lasting cough.  You have anxiety.  You are sleepy all the time.  You develop an illness that affects your breathing.  You cannot exercise at your regular level.  You are restless.  You have difficult or irregular breathing, and it is getting worse.  You have a fever.  You have persistent redness under your nose. Get help right away if:  You are confused.  You have blue lips or fingernails.  You are struggling to breathe. This information is not intended to replace advice given to you by your  health care provider. Make sure you discuss any questions you have with your health care provider. Document Released: 05/19/2003 Document Revised: 10/26/2015 Document Reviewed: 09/20/2015 Elsevier Interactive Patient Education  [employer].

## 2017-12-05 NOTE — Assessment & Plan Note (Signed)
Xopenex breathing treatment in office today  Chest x-ray today  Augmentin >>> Take 1 875-125 mg tablet every 12 hours for the next 7 days >>> Take with food  Prednisone 51m tablet  >>>4 tabs for 3 days, then 3 tabs for 3 days, 2 tabs for 3 days, then 1 tab for 3 days, then stop >>>take with food  >>>take in the morning  >>> Resume daily 2.5 mg dose after completing this taper  Continue oxygen therapy as prescribed >>> Contact our office his symptoms are worsening her oxygen needs are increasing >>> Ensure oxygenation remains greater than 90%  Continue Symbicort 160 >>> 2 puffs in the morning right when you wake up, rinse out your mouth after use, 12 hours later 2 puffs, rinse after use >>> Take this daily, no matter what >>> This is not a rescue inhaler   4-6 weeks follow up

## 2017-12-05 NOTE — Assessment & Plan Note (Signed)
Chest x-ray today  Prednisone 76m tablet  >>>4 tabs for 3 days, then 3 tabs for 3 days, 2 tabs for 3 days, then 1 tab for 3 days, then stop >>>take with food  >>>take in the morning  >>> Resume daily 2.5 mg dose after completing this taper  Continue oxygen therapy as prescribed >>> Contact our office his symptoms are worsening her oxygen needs are increasing >>> Ensure oxygenation remains greater than 90%  4-6 weeks follow up

## 2017-12-05 NOTE — Assessment & Plan Note (Signed)
  Follow up with Dr. Oleh Genin office regarding [place] and medications   Continue Adcirca Continue Letairis Continue Tyvaso  Continue oxygen therapy as prescribed >>> Contact our office his symptoms are worsening her oxygen needs are increasing >>> Ensure oxygenation remains greater than 90%

## 2017-12-05 NOTE — Progress Notes (Signed)
Your chest x-ray results of come back.  Showing no acute changes.  No plan of care changes at this time.  Keep follow-up appointment.    Follow-up with our office if symptoms worsen or you do not feel like you are improving under her current regimen.  It was a pleasure taking care of you,  Enriqueta Augusta, FNP 

## 2017-12-11 ENCOUNTER — Telehealth: Payer: Self-pay | Admitting: *Deleted

## 2017-12-11 NOTE — Telephone Encounter (Signed)
-----  Message from Lauraine Rinne, NP sent at 12/11/2017 12:54 PM EDT ----- Can we ensure we followed up with the patient to update them on her chest x-ray results.  Does not look like there is any acute changes.  Is not seen a documented follow-up note with the patient  Erin Quaker, FNP

## 2017-12-11 NOTE — Telephone Encounter (Signed)
Notes recorded by Lauraine Rinne, NP on 12/05/2017 at 2:49 PM EDT Your chest x-ray results of come back. Showing no acute changes. No plan of care changes at this time. Keep follow-up appointment.   Follow-up with our office if symptoms worsen or you do not feel like you are improving under her current regimen.  It was a pleasure taking care of you,  Wyn Quaker, FNP   [facility] x1 on preferred phone number listed for patient.

## 2017-12-18 NOTE — Telephone Encounter (Signed)
Was able to talk to patient regarding patient's CXR results.  They verbalized an understanding of what was discussed. No further questions at this time.   Patient will want Flu shot at next appt on 01/02/2018

## 2017-12-27 ENCOUNTER — Telehealth (HOSPITAL_COMMUNITY): Payer: Self-pay | Admitting: Pharmacist

## 2017-12-27 NOTE — Telephone Encounter (Signed)
[employer] patient assistance approved for Letairis through 03/11/18.   Ruta Hinds. Velva Harman, PharmD, BCPS, CPP Clinical Pharmacist Phone: [phone] 12/27/2017 9:43 AM

## 2018-01-01 NOTE — Progress Notes (Addendum)
_0  ID: Erin Zavala, female    DOB: Jun 19, 1965, 52 y.o.   MRN: [id number]  Chief Complaint  Patient presents with  . Follow-up    PAH, 4-6 week follow up     Referring provider: [name], MD  HPI:  52 year old female followed in our office for sarcoidosis with pulmonary involvement/ocular involvement, followed in cardiology clinic for the pulmonary arterial hypertension (treated with [name])  PMH: PAH - Dr. Aundra Zavala Smoker/ Smoking History: Former Smoker. 2.5 years.  Maintenance:  2.46m daily prednisone, Symbicort 160 Pt of: Dr. [name]   01/02/2018  - Visit   52year old female patient returning to our office for 4 to 6-week follow-up visit.  Patient was last seen for an acute visit was treated with antibiotics as well as a prednisone taper.  The patient at that time was off of Tyvaso.  Patient has resumed Tyvaso.  Unfortunately patient's insurance lapsed and this caused her to be without her Adcirca.  Patient reports that cardiology helped her with the paperwork and Adcirca should be arriving today (01/02/2018).  Patient plans on starting Adcirca back today.  Patient has remained on Letairis.  Patient reports that her oxygen levels have been stable on her prescribed oxygen amount of 2 L O2 pulsed.  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  Patient does report that somebody came in and was sick at work and she had a slight setback of 2 to 3 days of feeling more fatigue and congestion.  Patient has been taking over-the-counter Tylenol and occasionally Tylenol Cold.  Patient reports this is help manage her symptoms.  Patient reports her fatigue and shortness of breath are almost back to baseline.  Slightly worse fatigue which she attributes to being off of the Adcirca.  Patient does report that she continues to have postnasal drip this is her baseline as well.  Still having productive with clear to thick mucus.    Tests:    06/05/2017-CT chest high-res- mediastinal, hilar, pulmonary parenchymal fibrotic changes of sarcoid, stable, PAH 09/20/2017-chest x-ray- relatively unchanged chronic interstitial lung disease secondary to sarcoidosis 12/05/2017-chest x-ray-stable chronic findings consistent with sarcoid, no significant change since prior exam in July/2019  07/23/2017-echocardiogram-LV ejection fraction 60 to 65%, PA P pressure 37 >>>Normal systolic function PAP pressure has decreased from 60-37 from 11/02/2015 echocardiogram  06/26/2017-pulmonary function test- FVC 1.1, FVC 1.61 (66% predicted), ratio 84, FEV1 69%, no significant bronchodilator response, DLCO 34     FENO:  No results found for: NITRICOXIDE  PFT: PFT Results Latest Ref Rng & Units 06/26/2017 11/28/2016 08/23/2016 12/27/2015  FVC-Pre L 1.61 1.69 1.70 1.53  FVC-Predicted Pre % 66 68 69 61  FVC-Post L 1.67 - - 1.56  FVC-Predicted Post % 68 - - 63  Pre FEV1/FVC % % 84 87 87 79  Post FEV1/FCV % % 87 - - 74  FEV1-Pre L 1.35 1.46 1.49 1.21  FEV1-Predicted Pre % 69 74 75 60  DLCO UNC% % 34 _1 DLCO COR %Predicted % 59 51 54 47  TLC L 2.80 - - 2.62  TLC % Predicted % 62 - - 58  RV % Predicted % 78 - - 70    Imaging: Dg Chest 2 View  Result Date: 12/05/2017 CLINICAL DATA:  Cough, shortness of breath. EXAM: CHEST - 2 VIEW COMPARISON:  Radiographs of September 20, 2017. FINDINGS: The heart size and mediastinal contours are within normal limits. No pneumothorax or pleural effusion  is noted. Stable coarse interstitial densities are noted throughout both lungs consistent with history of sarcoidosis. No significant changes noted compared to prior exam. The visualized skeletal structures are unremarkable. IMPRESSION: Stable chronic findings consistent with sarcoidosis. No significant change compared to prior exam. Electronically Signed   By: Marijo Conception, M.D.   On: 12/05/2017 11:48    Chart Review:    Specialty Problems      Pulmonary  Problems   Pulmonary sarcoidosis (HCC)   Childhood asthma   Acute bronchitis   Sinus congestion   Chronic respiratory failure with hypoxia (HCC)   Acute asthmatic bronchitis    09/20/2017  After extensive coaching inhaler device  effectiveness =    50% from a baseline of 25% > changed flovent to symb 160 2bid and provided neb for backup duoneb       Sinusitis, acute, maxillary      Allergies  Allergen Reactions  . No Known Allergies     Immunization History  Administered Date(s) Administered  . Influenza,inj,Quad PF,6+ Mos 02/28/2016, 12/18/2016, 01/02/2018  . PPD Test 02/28/2016  . Pneumococcal Conjugate-13 11/29/2016  . Pneumococcal Polysaccharide-23 11/03/2015   Flu vaccine today  Past Medical History:  Diagnosis Date  . Asthma    as a child  . Cough   . Dyspnea   . GERD (gastroesophageal reflux disease)   . Migraines   . Oxygen dependent   . Pulmonary hypertension ([place])   . Sarcoidosis     Tobacco History: Social History   Tobacco Use  Smoking Status Former Smoker  . Packs/day: 0.25  . Years: 10.00  . Pack years: 2.50  . Types: Cigarettes  . Last attempt to quit: 01/11/2011  . Years since quitting: 6.9  Smokeless Tobacco Never Used   Counseling given: Yes  Continue to not smoke  Outpatient Encounter Medications as of 01/02/2018  Medication Sig  . albuterol (PROVENTIL HFA;VENTOLIN HFA) 108 (90 Base) MCG/ACT inhaler Inhale 2 puffs into the lungs every 4 (four) hours as needed for wheezing or shortness of breath.  [name] ambrisentan (LETAIRIS) 10 MG tablet Take 1 tablet (10 mg total) by mouth daily.  [name] aspirin 81 MG tablet Take 1 tablet (81 mg total) by mouth daily.  [name] atorvastatin (LIPITOR) 40 MG tablet TAKE 1 TABLET BY MOUTH EVERY DAY  . esomeprazole (NEXIUM) 40 MG capsule TAKE 1 CAPSULE BY MOUTH EVERY DAY  . ferrous sulfate 325 (65 FE) MG tablet TAKE 1 TABLET (325 MG TOTAL) BY MOUTH 2 (TWO) TIMES DAILY WITH A MEAL.  [name] ipratropium-albuterol (DUONEB)  0.5-2.5 (3) MG/3ML SOLN Take 3 mLs by nebulization every 6 (six) hours as needed.  . OXYGEN 2-3 lpm 24/7  [facility]  . predniSONE (DELTASONE) 10 MG tablet 4 tabs for 3 days, then 3 tabs for 3 days, 2 tabs for 3 days, then 1 tab for 3 days, then resume 2.75m daily dose  . predniSONE (DELTASONE) 2.5 MG tablet TAKE 1 TABLET (2.5 MG TOTAL) BY MOUTH DAILY WITH BREAKFAST.  . ranitidine (ZANTAC) 150 MG tablet TAKE 1 TABLET (150 MG TOTAL) BY MOUTH AT BEDTIME.  . SYMBICORT 160-4.5 MCG/ACT inhaler TAKE 2 PUFFS BY MOUTH TWICE A DAY  . Treprostinil (TYVASO) 0.6 MG/ML SOLN Inhale 18 mcg into the lungs 4 (four) times daily.  . ADCIRCA 20 MG tablet Take 2 tablets (40 mg total) by mouth daily. (Patient not taking: Reported on 01/02/2018)  . amoxicillin-clavulanate (AUGMENTIN) 875-125 MG tablet Take 1 tablet by mouth 2 (two) times  daily. (Patient not taking: Reported on 01/02/2018)  . famotidine (PEPCID) 40 MG tablet Take 1 tablet (40 mg total) by mouth daily. (Patient not taking: Reported on 01/02/2018)   No facility-administered encounter medications on file as of 01/02/2018.      Review of Systems  Review of Systems  Constitutional: Positive for fatigue (not as much, improving -attributes continued fatigue to being off of Adcirca). Negative for chills, fever and unexpected weight change.  HENT: Positive for postnasal drip. Negative for congestion, ear pain, sinus pressure and sinus pain.   Respiratory: Positive for shortness of breath (sob improved, doing better). Negative for cough, chest tightness and wheezing.   Cardiovascular: Negative for chest pain and palpitations.  Gastrointestinal: Negative for blood in stool, diarrhea, nausea and vomiting.  Genitourinary: Negative for dysuria, frequency and urgency.  Musculoskeletal: Negative for arthralgias.  Skin: Negative for color change.  Allergic/Immunologic: Positive for environmental allergies. Negative for food allergies.  Neurological: Negative for  dizziness, light-headedness and headaches.  Psychiatric/Behavioral: Negative for dysphoric mood. The patient is not nervous/anxious.   All other systems reviewed and are negative.    Physical Exam  BP 116/84 (BP Location: Left Arm, Cuff Size: Normal)   Pulse 81   Ht 5' (1.524 m)   Wt 140 lb (63.5 kg)   LMP 12/05/2017   SpO2 91%   BMI 27.34 kg/m   Wt Readings from Last 5 Encounters:  01/02/18 140 lb (63.5 kg)  12/05/17 134 lb (60.8 kg)  11/04/17 133 lb (60.3 kg)  10/08/17 141 lb (64 kg)  10/03/17 141 lb (64 kg)    Physical Exam  Constitutional: She is oriented to person, place, and time and well-developed, well-nourished, and in no distress. No distress.  HENT:  Head: Normocephalic and atraumatic.  Right Ear: Hearing, tympanic membrane, external ear and ear canal normal.  Left Ear: Hearing, tympanic membrane, external ear and ear canal normal.  Nose: Nose normal. Right sinus exhibits no maxillary sinus tenderness and no frontal sinus tenderness. Left sinus exhibits no maxillary sinus tenderness and no frontal sinus tenderness.  Mouth/Throat: Uvula is midline. Posterior oropharyngeal erythema present.  +2 small ulcers in mouth   Eyes: Pupils are equal, round, and reactive to light.  Neck: Normal range of motion. Neck supple. No JVD present.  Cardiovascular: Normal rate, regular rhythm and normal heart sounds.  Pulmonary/Chest: Effort normal and breath sounds normal. No accessory muscle usage. No respiratory distress. She has no decreased breath sounds. She has no wheezes. She has no rhonchi.  Abdominal: Soft. Bowel sounds are normal. There is no tenderness.  Musculoskeletal: Normal range of motion. She exhibits no edema.  Lymphadenopathy:    She has no cervical adenopathy.  Neurological: She is alert and oriented to person, place, and time. Gait normal.  Skin: Skin is warm and dry. She is not diaphoretic. No erythema.  Psychiatric: Mood, memory, affect and judgment normal.   Nursing note and vitals reviewed.     Lab Results:  CBC    Component Value Date/Time   WBC 7.7 12/05/2016 1219   RBC 4.00 12/05/2016 1219   HGB 8.6 Repeated and verified X2. (L) 12/05/2016 1219   HCT 28.4 (L) 12/05/2016 1219   PLT 419.0 (H) 12/05/2016 1219   MCV 71.1 (L) 12/05/2016 1219   MCH 29.0 01/13/2016 1245   MCHC 30.2 12/05/2016 1219   RDW 19.7 (H) 12/05/2016 1219   LYMPHSABS 1.1 09/26/2016 1707   MONOABS 0.7 09/26/2016 1707   EOSABS 0.3 09/26/2016 1707  BASOSABS 0.1 09/26/2016 1707    BMET    Component Value Date/Time   NA 138 11/29/2016 1702   K 4.3 11/29/2016 1702   CL 106 11/29/2016 1702   CO2 24 11/29/2016 1702   GLUCOSE 99 11/29/2016 1702   BUN 10 11/29/2016 1702   CREATININE 0.76 11/29/2016 1702   CALCIUM 9.6 11/29/2016 1702   GFRNONAA >60 05/18/2016 1109   GFRAA >60 05/18/2016 1109    BNP    Component Value Date/Time   BNP 17.6 10/08/2017 1036    ProBNP No results found for: PROBNP    Assessment & Plan:   52 year old female patient presenting today for follow-up visit.  Patient is looking much better since last office visit.  Patient's acute symptoms have almost completely resolved.  I suspect patient's continued fatigue is directly related to her being off of her Adcirca.  Patient reports that she will restart this today.  Flu shot today.  Patient can follow-up with Dr. [name] in 3 months or sooner as needed.  Chronic respiratory failure with hypoxia (HCC) Continue oxygen therapy as prescribed Notify our office if you are having increased oxygen needs  Use your rescue inhaler as needed for shortness of breath or wheezing every 4-6 hours  If at home and feeling short of breath or wheezing nebulizer treatment before your rescue inhaler  Follow-up with our office in 3 months, sooner if you are having acute or worsening symptoms    Pulmonary sarcoidosis ([place]) Completed prednisone taper, then resume 2.61m daily dose   Continue oxygen  therapy as prescribed Notify our office if you are having increased oxygen needs  Use your rescue inhaler as needed for shortness of breath or wheezing every 4-6 hours  If at home and feeling short of breath or wheezing nebulizer treatment before your rescue inhaler  Flu shot today   Follow-up with our office in 3 months, sooner if you are having acute or worsening symptoms   Health care maintenance Flu vaccine today  Pulmonary hypertension ([place] Completed prednisone taper, then resume 2.57mdaily dose   Continue oxygen therapy as prescribed Notify our office if you are having increased oxygen needs  Use your rescue inhaler as needed for shortness of breath or wheezing every 4-6 hours  If at home and feeling short of breath or wheezing nebulizer treatment before your rescue inhaler  Flu shot today   Follow-up with our office in 3 months, sooner if you are having acute or worsening symptoms  Resume Adcirca today Continue Tyvaso Continue LeOrland JarredNP 01/02/2018

## 2018-01-02 ENCOUNTER — Encounter: Payer: Self-pay | Admitting: Pulmonary Disease

## 2018-01-02 ENCOUNTER — Ambulatory Visit (INDEPENDENT_AMBULATORY_CARE_PROVIDER_SITE_OTHER): Payer: Self-pay | Admitting: Pulmonary Disease

## 2018-01-02 VITALS — BP 116/84 | HR 81 | Ht 60.0 in | Wt 140.0 lb

## 2018-01-02 DIAGNOSIS — B370 Candidal stomatitis: Secondary | ICD-10-CM

## 2018-01-02 DIAGNOSIS — Z0000 Encounter for general adult medical examination without abnormal findings: Secondary | ICD-10-CM

## 2018-01-02 DIAGNOSIS — Z23 Encounter for immunization: Secondary | ICD-10-CM

## 2018-01-02 DIAGNOSIS — J9611 Chronic respiratory failure with hypoxia: Secondary | ICD-10-CM

## 2018-01-02 DIAGNOSIS — D860 Sarcoidosis of lung: Secondary | ICD-10-CM

## 2018-01-02 DIAGNOSIS — I272 Pulmonary hypertension, unspecified: Secondary | ICD-10-CM

## 2018-01-02 MED ORDER — NYSTATIN 100000 UNIT/ML MT SUSP: 5.0000 mL | Freq: Four times a day (QID) | OROMUCOSAL | 0 refills | Status: DC

## 2018-01-02 NOTE — Assessment & Plan Note (Signed)
Continue oxygen therapy as prescribed Notify our office if you are having increased oxygen needs  Use your rescue inhaler as needed for shortness of breath or wheezing every 4-6 hours  If at home and feeling short of breath or wheezing nebulizer treatment before your rescue inhaler  Follow-up with our office in 3 months, sooner if you are having acute or worsening symptoms

## 2018-01-02 NOTE — Assessment & Plan Note (Signed)
Completed prednisone taper, then resume 2.64m daily dose   Continue oxygen therapy as prescribed Notify our office if you are having increased oxygen needs  Use your rescue inhaler as needed for shortness of breath or wheezing every 4-6 hours  If at home and feeling short of breath or wheezing nebulizer treatment before your rescue inhaler  Flu shot today   Follow-up with our office in 3 months, sooner if you are having acute or worsening symptoms  Resume Adcirca today Continue Tyvaso Continue Letairis

## 2018-01-02 NOTE — Assessment & Plan Note (Signed)
Flu vaccine today

## 2018-01-02 NOTE — Patient Instructions (Addendum)
Completed prednisone taper, then resume 2.73m daily dose   Continue oxygen therapy as prescribed Notify our office if you are having increased oxygen needs  Use your rescue inhaler as needed for shortness of breath or wheezing every 4-6 hours  If at home and feeling short of breath or wheezing nebulizer treatment before your rescue inhaler  Flu shot today   Follow-up with our office in 3 months, sooner if you are having acute or worsening symptoms  Resume Adcirca today Continue Tyvaso Continue Letairis    November/2019 we will be moving! We will no longer be at our [facility].  Be on the look out for a post card/mailer to let you know we have officially moved.  Our new address and phone number will be:  [address]  [id number] number: [phone] It is flu season:   >>>Remember to be washing your hands regularly, using hand sanitizer, be careful to use around herself with has contact with people who are sick will increase her chances of getting sick yourself. >>> Best ways to protect herself from the flu: Receive the yearly flu vaccine, practice good hand hygiene washing with soap and also using hand sanitizer when available, eat a nutritious meals, get adequate rest, hydrate appropriately   Please contact the office if your symptoms worsen or you have concerns that you are not improving.   Thank you for choosing  [facility] for your healthcare, and for allowing [facility] partner with you on your healthcare journey. I am thankful to be able to provide care to you today.   BWyn QuakerFNP-C    Influenza Virus Vaccine injection What is this medicine? INFLUENZA VIRUS VACCINE (in floo EN zuh VAHY ruhs vak SEEN) helps to reduce the risk of getting influenza also known as the flu. The vaccine only helps protect you against some strains of the flu. This medicine may be used for other purposes; ask your health care provider or  pharmacist if you have questions. COMMON BRAND NAME(S): Afluria, Agriflu, Alfuria, FLUAD, Fluarix, Fluarix Quadrivalent, Flublok, Flublok Quadrivalent, FLUCELVAX, Flulaval, Fluvirin, Fluzone, Fluzone High-Dose, Fluzone Intradermal What should I tell my health care provider before I take this medicine? They need to know if you have any of these conditions: -bleeding disorder like hemophilia -fever or infection -Guillain-Barre syndrome or other neurological problems -immune system problems -infection with the human immunodeficiency virus (HIV) or AIDS -low blood platelet counts -multiple sclerosis -an unusual or allergic reaction to influenza virus vaccine, latex, other medicines, foods, dyes, or preservatives. Different brands of vaccines contain different allergens. Some may contain latex or eggs. Talk to your doctor about your allergies to make sure that you get the right vaccine. -pregnant or trying to get pregnant -breast-feeding How should I use this medicine? This vaccine is for injection into a muscle or under the skin. It is given by a health care professional. A copy of Vaccine Information Statements will be given before each vaccination. Read this sheet carefully each time. The sheet may change frequently. Talk to your healthcare provider to see which vaccines are right for you. Some vaccines should not be used in all age groups. Overdosage: If you think you have taken too much of this medicine contact a poison control center or emergency room at once. NOTE: This medicine is only for you. Do not share this medicine with others. What if I miss a dose? This does not apply. What may interact with this medicine? -  chemotherapy or radiation therapy -medicines that lower your immune system like etanercept, anakinra, infliximab, and adalimumab -medicines that treat or prevent blood clots like warfarin -phenytoin -steroid medicines like prednisone or  cortisone -theophylline -vaccines This list may not describe all possible interactions. Give your health care provider a list of all the medicines, herbs, non-prescription drugs, or dietary supplements you use. Also tell them if you smoke, drink alcohol, or use illegal drugs. Some items may interact with your medicine. What should I watch for while using this medicine? Report any side effects that do not go away within 3 days to your doctor or health care professional. Call your health care provider if any unusual symptoms occur within 6 weeks of receiving this vaccine. You may still catch the flu, but the illness is not usually as bad. You cannot get the flu from the vaccine. The vaccine will not protect against colds or other illnesses that may cause fever. The vaccine is needed every year. What side effects may I notice from receiving this medicine? Side effects that you should report to your doctor or health care professional as soon as possible: -allergic reactions like skin rash, itching or hives, swelling of the face, lips, or tongue Side effects that usually do not require medical attention (report to your doctor or health care professional if they continue or are bothersome): -fever -headache -muscle aches and pains -pain, tenderness, redness, or swelling at the injection site -tiredness This list may not describe all possible side effects. Call your doctor for medical advice about side effects. You may report side effects to FDA at 1-800-FDA-1088. Where should I keep my medicine? The vaccine will be given by a health care professional in a clinic, pharmacy, doctor's office, or other health care setting. You will not be given vaccine doses to store at home. NOTE: This sheet is a summary. It may not cover all possible information. If you have questions about this medicine, talk to your doctor, pharmacist, or health care provider.  2018 Elsevier/Gold Standard (2014-09-17 10:07:28)

## 2018-01-02 NOTE — Assessment & Plan Note (Signed)
Completed prednisone taper, then resume 2.47m daily dose   Continue oxygen therapy as prescribed Notify our office if you are having increased oxygen needs  Use your rescue inhaler as needed for shortness of breath or wheezing every 4-6 hours  If at home and feeling short of breath or wheezing nebulizer treatment before your rescue inhaler  Flu shot today   Follow-up with our office in 3 months, sooner if you are having acute or worsening symptoms

## 2018-01-02 NOTE — Assessment & Plan Note (Signed)
Nystatin thrush prescription provided

## 2018-01-02 NOTE — Addendum Note (Signed)
Addended by: Lauraine Rinne on: 01/02/2018 01:53 PM   Modules accepted: Orders

## 2018-01-07 ENCOUNTER — Encounter (HOSPITAL_COMMUNITY): Payer: Medicaid Other | Admitting: Cardiology

## 2018-02-03 ENCOUNTER — Telehealth: Payer: Self-pay | Admitting: Emergency Medicine

## 2018-02-03 MED ORDER — PREDNISONE 10 MG PO TABS: ORAL_TABLET | ORAL | 0 refills | Status: DC

## 2018-02-03 NOTE — Telephone Encounter (Signed)
Spoke with pt, aware of recs.  rx sent to preferred pharmacy.  Nothing further needed.  

## 2018-02-03 NOTE — Telephone Encounter (Signed)
Any fevers? Chills? Other symptoms?   Only cough and wheezing with a clear sputum?  Rescue inhaler use? Has the 7.30m of prednisone helped at all?  BAaron Edelman

## 2018-02-03 NOTE — Telephone Encounter (Signed)
Called pt to check to see if she has had any fever. Pt denies any fever but states she has had some chills.  Pt states she is coughing up clear mucus and has had to use the rescue inhaler about twice daily. Pt is unsure if the 7.48m of prednisone has really helped her since she just began 01/29/18.

## 2018-02-03 NOTE — Telephone Encounter (Signed)
Can offer patient:  Prednisone 51m tablet  >>>Take 2 tablets (20 mg total) daily for the next 5 days >>> Take with food in the morning   Please place this prescription.  If symptoms worsen, or do not improve under this current regimen patient will need office visit for further evaluation  BWyn QuakerFNP

## 2018-02-03 NOTE — Telephone Encounter (Signed)
Called and spoke with pt who stated she began to cough and has had some wheezing as well that began Wednesday, 01/29/18. Pt stated there were a couple people at her work that came in, had the flu, and then went out.  Pt states she did up her prednisone from 2.66m to 7.512msince 01/29/18. Pt states she is coughing up clear mucus. Pt denies any fever.  Pt wants to know if something can be prescribed to help with her symptoms. BrAaron Edelmanplease advise on this for pt as she saw you last 01/02/18. Thanks!

## 2018-02-05 ENCOUNTER — Encounter: Payer: Self-pay | Admitting: Internal Medicine

## 2018-02-14 ENCOUNTER — Telehealth: Payer: Self-pay | Admitting: Emergency Medicine

## 2018-02-14 MED ORDER — DOXYCYCLINE HYCLATE 100 MG PO TABS: 100.0000 mg | ORAL_TABLET | Freq: Two times a day (BID) | ORAL | 0 refills | Status: DC

## 2018-02-14 NOTE — Telephone Encounter (Signed)
Called and spoke with Patient.  Dr. Agustina Caroli recommendations given.  Prescription sent to preferred pharmacy.  OV scheduled for Monday, 02/17/18, at 0900, with Wyn Quaker, NP.  Nothing further at this time.  Per RB- She has been treated with abx and pred x 2 since the last time I saw her. Unclear why we have worsening sx and control of her sarcoidosis.  She needs to be seen in office next week In meantime start doxycycline 149m bid x 7 days. This would be preferred to augmentin given the sx she is describing.

## 2018-02-14 NOTE — Telephone Encounter (Signed)
She has been treated with abx and pred x 2 since the last time I saw her. Unclear why we have worsening sx and control of her sarcoidosis.  She needs to be seen in office next week In meantime start doxycycline 131m bid x 7 days. This would be preferred to augmentin given the sx she is describing.

## 2018-02-14 NOTE — Telephone Encounter (Signed)
Called and spoke to Patient.  She is complaining of a productive cough with dark yellow sputum.  She denies fever, but is having hot and cold flashes.  She feels more SHOB then normal. She is using her inhalers and taking Tylenol Cold and Flu.  Patient is requesting any prescriptions to [facility].     Will route to Dr. [name]

## 2018-02-17 ENCOUNTER — Ambulatory Visit (INDEPENDENT_AMBULATORY_CARE_PROVIDER_SITE_OTHER)
Admission: RE | Admit: 2018-02-17 | Discharge: 2018-02-17 | Disposition: A | Discharge status: Home / Self Care | Payer: Self-pay | Source: Ambulatory Visit | Attending: Primary Care | Admitting: Primary Care

## 2018-02-17 ENCOUNTER — Encounter: Payer: Self-pay | Admitting: Primary Care

## 2018-02-17 ENCOUNTER — Ambulatory Visit (INDEPENDENT_AMBULATORY_CARE_PROVIDER_SITE_OTHER): Payer: Self-pay | Admitting: Primary Care

## 2018-02-17 ENCOUNTER — Other Ambulatory Visit: Payer: Self-pay | Admitting: *Deleted

## 2018-02-17 VITALS — BP 100/64 | HR 100 | Temp 97.5°F | Ht 60.0 in | Wt 138.0 lb

## 2018-02-17 DIAGNOSIS — J9611 Chronic respiratory failure with hypoxia: Secondary | ICD-10-CM

## 2018-02-17 DIAGNOSIS — D860 Sarcoidosis of lung: Secondary | ICD-10-CM

## 2018-02-17 DIAGNOSIS — J209 Acute bronchitis, unspecified: Secondary | ICD-10-CM

## 2018-02-17 DIAGNOSIS — J45909 Unspecified asthma, uncomplicated: Secondary | ICD-10-CM

## 2018-02-17 LAB — BASIC METABOLIC PANEL
BUN: 8 mg/dL (ref 6–23)
CALCIUM: 8.8 mg/dL (ref 8.4–10.5)
CO2: 25 meq/L (ref 19–32)
CREATININE: 0.73 mg/dL (ref 0.40–1.20)
Chloride: 104 mEq/L (ref 96–112)
GFR: 107.7 mL/min (ref >60.00)
Glucose, Bld: 98 mg/dL (ref 70–99)
Potassium: 3.6 mEq/L (ref 3.5–5.1)
Sodium: 139 mEq/L (ref 135–145)

## 2018-02-17 LAB — CBC
HCT: 40.7 % (ref 36.0–46.0)
Hemoglobin: 13 g/dL (ref 12.0–15.0)
MCHC: 31.8 g/dL (ref 30.0–36.0)
MCV: 90.5 fl (ref 78.0–100.0)
Platelets: 298 10*3/uL (ref 150.0–400.0)
RBC: 4.5 Mil/uL (ref 3.87–5.11)
RDW: 15 % (ref 11.5–15.5)
WBC: 8.5 10*3/uL (ref 4.0–10.5)

## 2018-02-17 MED ORDER — PREDNISONE 10 MG PO TABS: ORAL_TABLET | ORAL | 0 refills | Status: DC

## 2018-02-17 MED ORDER — BENZONATATE 200 MG PO CAPS: 200.0000 mg | ORAL_CAPSULE | Freq: Three times a day (TID) | ORAL | 1 refills | Status: DC | PRN

## 2018-02-17 MED ORDER — ALBUTEROL SULFATE HFA 108 (90 BASE) MCG/ACT IN AERS: 2.0000 | INHALATION_SPRAY | RESPIRATORY_TRACT | 5 refills | Status: DC | PRN

## 2018-02-17 NOTE — Progress Notes (Signed)
_0  ID: Erin Zavala, female    DOB: 10/18/1965, 52 y.o.   MRN: [id number]  Chief Complaint  Patient presents with  . Follow-up    cough with clear to yellow mucus, SOB with exertion, cough to vomit with swollen chin, wheezing,    Referring provider: [name], MD  HPI: 52 year old female, former smoker.  Past medical history significant for pulmonary sarcoidosis, chronic respiratory failure with hypoxia, asthmatic bronchitis, chronic sinusitis.  Patient of Dr. [name], last seen January 02, 2018. She called the office twice since visit with complaints of productive cough and sob. Treated with prednisone 8m x 5 days and doxycyline course for 1 week.  02/17/2018 Patient presents today with acute complaints of cough with clear to yellow mucus x 6-7 days. Associated nasal congestion/discharge, sneezing, shortness of breath and wheezing. Vomited yesterday d/t cough, reports that she noticed some chin swelling after. This has improved today. Normally she wears 2L oxygen at home. Needing 3-5L oxygen this past week since she's been sick. Continues Symbicort as prescribed. She has been using her Nebulizer once daily. Taking tylenol severe cold. She does not currently smoke. Thinks she keeps getting sick d/t work. Works at [employer], needs note to be out. No sick contacts that she knows of. No recent travel. Denies fever, chest pain, dif swallowing, abd pain, loose stools.   Allergies  Allergen Reactions  . No Known Allergies     Immunization History  Administered Date(s) Administered  . Influenza,inj,Quad PF,6+ Mos 02/28/2016, 12/18/2016, 01/02/2018  . PPD Test 02/28/2016  . Pneumococcal Conjugate-13 11/29/2016  . Pneumococcal Polysaccharide-23 11/03/2015    Past Medical History:  Diagnosis Date  . Asthma    as a child  . Cough   . Dyspnea   . GERD (gastroesophageal reflux disease)   . Migraines   . Oxygen dependent   . Pulmonary hypertension ([facility]   . Sarcoidosis      Tobacco History: Social History   Tobacco Use  Smoking Status Former Smoker  . Packs/day: 0.25  . Years: 10.00  . Pack years: 2.50  . Types: Cigarettes  . Last attempt to quit: 01/11/2011  . Years since quitting: 7.1  Smokeless Tobacco Never Used   Counseling given: Not Answered   Outpatient Medications Prior to Visit  Medication Sig Dispense Refill  . ADCIRCA 20 MG tablet Take 2 tablets (40 mg total) by mouth daily. 180 tablet 3  . ambrisentan (LETAIRIS) 10 MG tablet Take 1 tablet (10 mg total) by mouth daily. 30 tablet 11  . aspirin 81 MG tablet Take 1 tablet (81 mg total) by mouth daily. 30 tablet 0  . atorvastatin (LIPITOR) 40 MG tablet TAKE 1 TABLET BY MOUTH EVERY DAY 30 tablet 11  . doxycycline (VIBRA-TABS) 100 MG tablet Take 1 tablet (100 mg total) by mouth 2 (two) times daily. 14 tablet 0  . esomeprazole (NEXIUM) 40 MG capsule TAKE 1 CAPSULE BY MOUTH EVERY DAY 30 capsule 3  . famotidine (PEPCID) 40 MG tablet Take 1 tablet (40 mg total) by mouth daily. 30 tablet 2  . ferrous sulfate 325 (65 FE) MG tablet TAKE 1 TABLET (325 MG TOTAL) BY MOUTH 2 (TWO) TIMES DAILY WITH A MEAL. 60 tablet 5  . ipratropium-albuterol (DUONEB) 0.5-2.5 (3) MG/3ML SOLN Take 3 mLs by nebulization every 6 (six) hours as needed. 360 mL 11  . nystatin (MYCOSTATIN) 100000 UNIT/ML suspension Take 5 mLs (500,000 Units total) by mouth 4 (four) times daily. 60 mL 0  .  OXYGEN 2-3 lpm 24/7  [facility]    . predniSONE (DELTASONE) 2.5 MG tablet TAKE 1 TABLET (2.5 MG TOTAL) BY MOUTH DAILY WITH BREAKFAST. 30 tablet 2  . SYMBICORT 160-4.5 MCG/ACT inhaler TAKE 2 PUFFS BY MOUTH TWICE A DAY 10.2 Inhaler 1  . Treprostinil (TYVASO) 0.6 MG/ML SOLN Inhale 18 mcg into the lungs 4 (four) times daily.    [name] albuterol (PROVENTIL HFA;VENTOLIN HFA) 108 (90 Base) MCG/ACT inhaler Inhale 2 puffs into the lungs every 4 (four) hours as needed for wheezing or shortness of breath. 1 Inhaler 5  . amoxicillin-clavulanate (AUGMENTIN) 875-125  MG tablet Take 1 tablet by mouth 2 (two) times daily. 14 tablet 0  . predniSONE (DELTASONE) 10 MG tablet 2 tabs daily X5 days. 10 tablet 0  . ranitidine (ZANTAC) 150 MG tablet TAKE 1 TABLET (150 MG TOTAL) BY MOUTH AT BEDTIME. 30 tablet 3   No facility-administered medications prior to visit.     Review of Systems  Review of Systems  Constitutional: Negative.   HENT: Positive for congestion, postnasal drip and sinus pressure.   Respiratory: Positive for cough, shortness of breath and wheezing.   Cardiovascular: Negative.   Gastrointestinal: Negative.     Physical Exam  BP 100/64 (BP Location: Right Arm, Cuff Size: Normal)   Pulse 100   Temp (!) 97.5 F (36.4 C)   Ht 5' (1.524 m)   Wt 138 lb (62.6 kg)   SpO2 98%   BMI 26.95 kg/m  Physical Exam  Constitutional: She is oriented to person, place, and time. She appears well-developed and well-nourished. No distress.  HENT:  Head: Normocephalic and atraumatic.  Mouth/Throat: Uvula is midline, oropharynx is clear and moist and mucous membranes are normal. No oropharyngeal exudate.  Submandibular swelling  Throat clear  Eyes: Pupils are equal, round, and reactive to light. EOM are normal.  Neck: Normal range of motion. Neck supple.  Cardiovascular: Normal rate and regular rhythm.  Pulmonary/Chest: Effort normal. No stridor. No respiratory distress. She has wheezes. She has no rales.  Insp wheeze t/o  O2 98% on 4L   Musculoskeletal: Normal range of motion.  Neurological: She is alert and oriented to person, place, and time.  Skin: Skin is warm and dry.  Psychiatric: She has a normal mood and affect. Her behavior is normal. Judgment and thought content normal.     Lab Results:  CBC    Component Value Date/Time   WBC 7.7 12/05/2016 1219   RBC 4.00 12/05/2016 1219   HGB 8.6 Repeated and verified X2. (L) 12/05/2016 1219   HCT 28.4 (L) 12/05/2016 1219   PLT 419.0 (H) 12/05/2016 1219   MCV 71.1 (L) 12/05/2016 1219   MCH 29.0  01/13/2016 1245   MCHC 30.2 12/05/2016 1219   RDW 19.7 (H) 12/05/2016 1219   LYMPHSABS 1.1 09/26/2016 1707   MONOABS 0.7 09/26/2016 1707   EOSABS 0.3 09/26/2016 1707   BASOSABS 0.1 09/26/2016 1707    BMET    Component Value Date/Time   NA 138 11/29/2016 1702   K 4.3 11/29/2016 1702   CL 106 11/29/2016 1702   CO2 24 11/29/2016 1702   GLUCOSE 99 11/29/2016 1702   BUN 10 11/29/2016 1702   CREATININE 0.76 11/29/2016 1702   CALCIUM 9.6 11/29/2016 1702   GFRNONAA >60 05/18/2016 1109   GFRAA >60 05/18/2016 1109    BNP    Component Value Date/Time   BNP 17.6 10/08/2017 1036    ProBNP No results found for: PROBNP  Imaging: No results found.   Assessment & Plan:   Acute asthmatic bronchitis - Continued productive cough and dyspnea since 12/3 - Needs CXR and labs today - Provided additional prednisone taper (56m x 3 days, 339m 3 days; 2011m 3 days; 67m63m3 days) - Instructed to take delsym OTC and tessalon perles as needed for cough  - Encourage albuterol neb every 4-6 hours for sob/wheezing - Provided note to be out of work until 12/13 - Recommend follow-up with me in 3-4 days, politely declined and will return/call if not better - Instructed to present to ED if symptoms worsen   Chronic respiratory failure with hypoxia (HCC)PurcellIncreased oxygen demand, requiring 3-5L  - O2 sat 98% today - Checking labs and CXR      ElizMartyn Ehrich 02/17/2018

## 2018-02-17 NOTE — Assessment & Plan Note (Addendum)
-  Continued productive cough and dyspnea since 12/3 - Needs CXR and labs today - Provided additional prednisone taper (59m x 3 days, 341m 3 days; 2077m 3 days; 43m41m3 days) - Instructed to take delsym OTC and tessalon perles as needed for cough  - Encourage albuterol neb every 4-6 hours for sob/wheezing - Provided note to be out of work until 12/13 - Recommend follow-up with me in 3-4 days, politely declined and will return/call if not better - Instructed to present to ED if symptoms worsen

## 2018-02-17 NOTE — Assessment & Plan Note (Signed)
-  Increased oxygen demand, requiring 3-5L  - O2 sat 98% today - Checking labs and CXR

## 2018-02-17 NOTE — Patient Instructions (Signed)
CXR and labs today   Hold daily prednisone while on taper  Prednisone taper (34m x 3 days; 354mx 3 days; 201m 3 days; 88m51m days)  Will wait for CXR results to start you on abx   Delsym cough syrup twice daily Tessalon perle three times daily for cough  If symptoms are not improving please return later this week or call; if worsen please go to ED

## 2018-02-19 ENCOUNTER — Telehealth: Payer: Self-pay | Admitting: Primary Care

## 2018-02-19 NOTE — Telephone Encounter (Signed)
Called and spoke with patient, she is aware of results and verbalized understanding. Nothing further needed.

## 2018-02-21 ENCOUNTER — Telehealth: Payer: Self-pay | Admitting: Emergency Medicine

## 2018-02-21 NOTE — Telephone Encounter (Signed)
Seen by Eustaquio Maize NP  Will route

## 2018-02-21 NOTE — Telephone Encounter (Signed)
Call made to patient, patient states she needs a work note, states she was covered through today however she is not going to go back until Monday. Informed RB was not here.  TP please advise if you are willing to sign a work note for patient, she would like to pick it up Monday. She needs to work note  To allow her to return to work on Monday.

## 2018-02-21 NOTE — Telephone Encounter (Signed)
Yes fine

## 2018-02-24 NOTE — Telephone Encounter (Signed)
Called and spoke with patient, she is aware and verbalized understanding. I have printed and placed work note up front for the patient. Nothing further needed.

## 2018-03-03 ENCOUNTER — Telehealth: Payer: Self-pay | Admitting: Emergency Medicine

## 2018-03-03 MED ORDER — PREDNISONE 2.5 MG PO TABS: 2.5000 mg | ORAL_TABLET | Freq: Every day | ORAL | 2 refills | Status: DC

## 2018-03-03 NOTE — Telephone Encounter (Signed)
Called and spoke with Patient. She stated that she is needing a new refill for Prednisone 2.51m.  Last refill was 10/08/17. She has finished her Prednisone taper and has started back on her 2.54mdaily. Refill sent to [facility] [address] Nothing further at this time.

## 2018-03-24 ENCOUNTER — Encounter: Payer: Self-pay | Admitting: Internal Medicine

## 2018-04-01 ENCOUNTER — Encounter (HOSPITAL_COMMUNITY): Payer: Self-pay | Admitting: *Deleted

## 2018-04-01 ENCOUNTER — Ambulatory Visit (HOSPITAL_COMMUNITY)
Admission: RE | Admit: 2018-04-01 | Discharge: 2018-04-01 | Disposition: A | Discharge status: Home / Self Care | Payer: Self-pay | Source: Ambulatory Visit | Attending: Cardiology | Admitting: Cardiology

## 2018-04-01 ENCOUNTER — Encounter (HOSPITAL_COMMUNITY): Payer: Self-pay | Admitting: Cardiology

## 2018-04-01 VITALS — BP 102/62 | HR 102 | Wt 138.2 lb

## 2018-04-01 DIAGNOSIS — I272 Pulmonary hypertension, unspecified: Secondary | ICD-10-CM

## 2018-04-01 DIAGNOSIS — Z7952 Long term (current) use of systemic steroids: Secondary | ICD-10-CM | POA: Insufficient documentation

## 2018-04-01 DIAGNOSIS — Z79899 Other long term (current) drug therapy: Secondary | ICD-10-CM | POA: Insufficient documentation

## 2018-04-01 DIAGNOSIS — I2510 Atherosclerotic heart disease of native coronary artery without angina pectoris: Secondary | ICD-10-CM | POA: Insufficient documentation

## 2018-04-01 DIAGNOSIS — J9611 Chronic respiratory failure with hypoxia: Secondary | ICD-10-CM | POA: Insufficient documentation

## 2018-04-01 DIAGNOSIS — Z7982 Long term (current) use of aspirin: Secondary | ICD-10-CM | POA: Insufficient documentation

## 2018-04-01 DIAGNOSIS — Z87891 Personal history of nicotine dependence: Secondary | ICD-10-CM | POA: Insufficient documentation

## 2018-04-01 DIAGNOSIS — I2721 Secondary pulmonary arterial hypertension: Secondary | ICD-10-CM | POA: Insufficient documentation

## 2018-04-01 DIAGNOSIS — Z9981 Dependence on supplemental oxygen: Secondary | ICD-10-CM | POA: Insufficient documentation

## 2018-04-01 DIAGNOSIS — D869 Sarcoidosis, unspecified: Secondary | ICD-10-CM | POA: Insufficient documentation

## 2018-04-01 DIAGNOSIS — Z8249 Family history of ischemic heart disease and other diseases of the circulatory system: Secondary | ICD-10-CM | POA: Insufficient documentation

## 2018-04-01 DIAGNOSIS — Z955 Presence of coronary angioplasty implant and graft: Secondary | ICD-10-CM | POA: Insufficient documentation

## 2018-04-01 LAB — BRAIN NATRIURETIC PEPTIDE: B Natriuretic Peptide: 17.7 pg/mL (ref 0.0–100.0)

## 2018-04-01 LAB — LIPID PANEL
CHOLESTEROL: 150 mg/dL (ref 0–200)
HDL: 46 mg/dL (ref >40)
LDL Cholesterol: 88 mg/dL (ref 0–99)
TRIGLYCERIDES: 78 mg/dL (ref <150)
Total CHOL/HDL Ratio: 3.3 RATIO
VLDL: 16 mg/dL (ref 0–40)

## 2018-04-01 NOTE — Patient Instructions (Signed)
Routine lab work today. Will notify you of abnormal results  Follow up in 3 months with Dr.McLean  

## 2018-04-01 NOTE — Progress Notes (Signed)
Pulmonology: Dr. [name] Cardiology: Dr. [name]  53 y.o. with sarcoidosis, chronic hypoxemic respiratory failure on home oxygen, and pulmonary hypertension presents for followup of CAD and pulmonary hypertension.  She was diagnosed with ocular sarcoidosis in 1997. It seems like she did not have significant lung complications (that were recognized at least) until early 2017.  She now requires home oxygen.  CTA chest shows changes consistent with sarcoidosis, this has been confirmed by biopsy.  Echo in 8/17 had evidence for RV strain and elevated PA pressure.  [place] in 10/17 confirmed pulmonary arterial hypertension with normal right and left heart filling pressures.    She has started on macitentan followed by Adcirca and most recently Tyvaso.  She feels like these medications have helped her breathing.    She went to [facility] for transplant evaluation in 4/18.  As part of that evaluation, she had right and left heart cath.  Surprisingly, given age and lack of family history, she had an 80% LAD stenosis.  This was treated with DES.  RHC showed mild to moderate pulmonary hypertension.   She felt better after starting ambrisentan and Adcirca.  I next started her on selexipag.  She developed diarrhea/vomiting at 200 mcg bid and got severe leg cramps in addition when it was increased to 400 mcg bid.  She finally stopped selexipag and symptoms all resolved. She also did not tolerate generic tadalafil well and is back on brand-name Adcirca.  Most recently, Tyvaso was started with no problems.   Transplant evaluation is currently on hold as she has been doing better symptomatically.   This fall/winter. She has had several flare of sarcoidosis/bronchitis, likely viral.  She has had several courses of prednisone. She is currently getting over a flare and is on prednisone.  Wheezing is improving. Breathing was worse with episode but is getting better.  Oxygen up to 3L by Teresita with bronchitis flare.  She has followup with  Dr. [name] on Friday.  No chest pain, no orthopnea/PND.    ECG (personally reviewed): NSR at 90 with short PR interval.     Labs (9/17): NA negative, ESR 112, anti-dsDNA negative, anti-CCP negative, RF < 14.  ACE 136.  Labs (10/17): K 4.1, creatinine 0.8 Labs (11/17): K 3.8, creatinine 0.91 Labs (3/18): BNP 16 Labs (5/18): K 4.6, creatinine 0.74 Labs (7/18): creatinine 0.79 Labs (9/18): K 4.3, creatinine 0.76 Labs (10/18): LDL 62, HDL 40 Labs (12/19): K 3.6, creatinine 0.73  6 minute walk (10/17): 305 m.  6 minute walk (1/18): 305 m 6 minute walk (3/18): 270 m 6 minute walk (6/18): 302 m 6 minute walk (9/18): 396 m 6 minute walk (2/19): 213 m (could have kept going but was stopped due to decreased oxygen saturation).  6 minute walk (4/19): 396 m 6 minute walk (7/19): 381 m  PMH: 1. GERD 2. Pulmonary hypertension: Suspect Group 5 PH related to sarcoidosis.  - CTA chest (8/17) with no PE but mediastinal/hilar LAN, cystic lung changes, subpleural consolidation, patchy ground glass.  - Echo (8/17): EF 55-60%, D-shaped interventricular septum, PASP 60 mmHg. - ANA negative, ESR 112, anti-dsDNA negative, anti-CCP negative, RF < 14.  - RHC (10/17): mean RA 3, PA 59/22 mean 36, mean PCWP 5, CI 2.92, PVR 7.4 WU.  - PFTs (10/17): Restrictive.  - RHC (4/18): mean RA 4, PA mean 32, mean PCWP 8, CI 2.9, PVR 5.2 WU.  - Did not tolerate selexipag.  - Echo (5/19): EF 60-65%, mild LVH, mild MR, RV normal  size and systolic function, PASP 37 mmHg.  3. Sarcoidosis: Diagnosed with ocular sarcoidosis in 1997. Significant pulmonary involvement noted in 8/17.  Lung biopsy positive.  - Cardiac MRI (5/18): EF 66%, normal RV size with low normal RV systolic function (EF 17%), no LGE (no evidence for cardiac sarcoidosis).  - CT chest 3/19 with stable pulmonary fibrotic changes related to sarcoidosis.  - PFTs (4/19): FVC 66%, FEV1 69%, ratio 103%, DLCO 37%, TLC 62% => moderate -severe mixed  obstruction/restriction.  4. Chronic hypoxemic respiratory failure: Home oxygen 5. Childhood asthma. 6. CAD: LHC (4/18) with 80% LAD stenosis => DES to LAD.   Social History   Socioeconomic History  . Marital status: Married    Spouse name: Not on file  . Number of children: Not on file  . Years of education: Not on file  . Highest education level: Not on file  Occupational History  . Not on file  Social Needs  . Financial resource strain: Not on file  . Food insecurity:    Worry: Not on file    Inability: Not on file  . Transportation needs:    Medical: Not on file    Non-medical: Not on file  Tobacco Use  . Smoking status: Former Smoker    Packs/day: 0.25    Years: 10.00    Pack years: 2.50    Types: Cigarettes    Last attempt to quit: 01/11/2011    Years since quitting: 7.2  . Smokeless tobacco: Never Used  Substance and Sexual Activity  . Alcohol use: No  . Drug use: No  . Sexual activity: Not on file  Lifestyle  . Physical activity:    Days per week: Not on file    Minutes per session: Not on file  . Stress: Not on file  Relationships  . Social connections:    Talks on phone: Not on file    Gets together: Not on file    Attends religious service: Not on file    Active member of club or organization: Not on file    Attends meetings of clubs or organizations: Not on file    Relationship status: Not on file  Other Topics Concern  . Not on file  Social History Narrative   Patient denies any bird or mold exposure. Currently works with exposure to inhaled perfumes and chemicals. 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 Previous exposure to inhaled dust while working in [employer]. Denies any IV drug use or previous incarceration. No known tuberculosis exposure. She has never lived nor volunteered at a homeless shelter.   Family History  Problem Relation Age of Onset  . Hypertension Mother   .  Hypertension Father   . Sarcoidosis Maternal Aunt   . Rheumatologic disease Neg Hx    ROS: All systems reviewed and negative except as per HPI.   Current Outpatient Medications  Medication Sig Dispense Refill  . ADCIRCA 20 MG tablet Take 2 tablets (40 mg total) by mouth daily. 180 tablet 3  . albuterol (PROVENTIL HFA;VENTOLIN HFA) 108 (90 Base) MCG/ACT inhaler Inhale 2 puffs into the lungs every 4 (four) hours as needed for wheezing or shortness of breath. 1 Inhaler 5  . ambrisentan (LETAIRIS) 10 MG tablet Take 1 tablet (10 mg total) by mouth daily. 30 tablet 11  . aspirin 81 MG tablet Take 1 tablet (81 mg total) by mouth daily. 30 tablet 0  . atorvastatin (LIPITOR) 40 MG  tablet TAKE 1 TABLET BY MOUTH EVERY DAY 30 tablet 11  . esomeprazole (NEXIUM) 40 MG capsule TAKE 1 CAPSULE BY MOUTH EVERY DAY 30 capsule 3  . ferrous sulfate 325 (65 FE) MG tablet TAKE 1 TABLET (325 MG TOTAL) BY MOUTH 2 (TWO) TIMES DAILY WITH A MEAL. 60 tablet 5  . ipratropium-albuterol (DUONEB) 0.5-2.5 (3) MG/3ML SOLN Take 3 mLs by nebulization every 6 (six) hours as needed. 360 mL 11  . OXYGEN 2-3 lpm 24/7  [facility]    . predniSONE (DELTASONE) 2.5 MG tablet Take 1 tablet (2.5 mg total) by mouth daily with breakfast. 30 tablet 2  . SYMBICORT 160-4.5 MCG/ACT inhaler TAKE 2 PUFFS BY MOUTH TWICE A DAY 10.2 Inhaler 1  . Treprostinil (TYVASO) 0.6 MG/ML SOLN Inhale 18 mcg into the lungs 4 (four) times daily.     No current facility-administered medications for this encounter.    BP 102/62   Pulse (!) 102   Wt 62.7 kg (138 lb 3.2 oz)   SpO2 99% Comment: 2L  BMI 26.99 kg/m  General: NAD Neck: No JVD, no thyromegaly or thyroid nodule.  Lungs: Occasional rhonchi.  CV: Nondisplaced PMI.  Heart regular S1/S2, no S3/S4, no murmur.  No peripheral edema.  No carotid bruit.  Normal pedal pulses.  Abdomen: Soft, nontender, no hepatosplenomegaly, no distention.  Skin: Intact without lesions or rashes.  Neurologic: Alert and oriented x  3.  Psych: Normal affect. Extremities: No clubbing or cyanosis.  HEENT: Normal.   Assessment/Plan: 1. Sarcoidosis: Ocular and lung involvement, biopsy-proven.  Cardiac MRI in 5/18 did not show evidence for cardiac sarcoidosis.  She is on prednisone and home oxygen. She has been evaluated at [facility] for lung transplant, this is currently on hold as she has clinically improved on PH meds.   - Follows with Dr. [name].  Currently on prednisone for flare of sarcoidosis/bronchitis. 2. Pulmonary hypertension: Pulmonary arterial hypertension. Suspect mixed group 1 and group 5 PH. CTA chest showed no PE but did show changes of sarcoidosis.  PFTs were restrictive.  Echo showed RV strain and elevated PA pressure.  PAH was confirmed by [place] in 10/17 with PVR 7.4 WU.  Serologic workup was negative.  [place] in 4/18 showed mild to moderate residual pulmonary hypertension. She has felt like generic tadalafil has not been as effective as brand name Adcirca. 6 minute walk today is stable compared to prior.  She is tolerating Tyvaso (unable to tolerate Malvin Johns).  Echo in 5/19 showed PASP 37 mmHg (improved).    - Continue ambrisentan.   - Continue brand-name Adcirca 40 mg daily.  - Continue Tyvaso.   - BNP today.  - Defer 6 minute walk today due to bronchitis, will plan to do at next appt.  3. CAD: Found incidentally on cath 4/18 done as part of workup for lung transplantation.  80% LAD stenosis, treated with DES to LAD.   No chest pain.  - Continue ASA 81 mg daily.   - Continue atorvastatin, check lipids today.   Followup in 3 months.   Loralie Champagne 04/01/2018

## 2018-04-04 ENCOUNTER — Encounter: Payer: Self-pay | Admitting: Emergency Medicine

## 2018-04-04 ENCOUNTER — Ambulatory Visit (INDEPENDENT_AMBULATORY_CARE_PROVIDER_SITE_OTHER): Payer: Self-pay | Admitting: Emergency Medicine

## 2018-04-04 DIAGNOSIS — J9611 Chronic respiratory failure with hypoxia: Secondary | ICD-10-CM

## 2018-04-04 DIAGNOSIS — R059 Cough, unspecified: Secondary | ICD-10-CM

## 2018-04-04 DIAGNOSIS — K219 Gastro-esophageal reflux disease without esophagitis: Secondary | ICD-10-CM

## 2018-04-04 DIAGNOSIS — D860 Sarcoidosis of lung: Secondary | ICD-10-CM

## 2018-04-04 DIAGNOSIS — R05 Cough: Secondary | ICD-10-CM

## 2018-04-04 DIAGNOSIS — I272 Pulmonary hypertension, unspecified: Secondary | ICD-10-CM

## 2018-04-04 NOTE — Assessment & Plan Note (Signed)
Followed also by the advanced heart failure clinic.  Please continue [name] as you have been taking them. Note plans to repeat echocardiogram and 6-minute walk this summer.

## 2018-04-04 NOTE — Assessment & Plan Note (Signed)
We will plan to repeat your high-resolution CT scan of the chest without contrast in March 2020 to follow sarcoidosis Please continue prednisone 2.5 mg daily Continue Symbicort 2 puffs twice a day.  Remember to rinse and gargle after you use this. Keep your albuterol nebulizer available to use 1 treatment up to every 4 hours if you needed it for shortness of breath, chest tightness, wheezing. Follow with Dr [name] in March after your CT scan of the chest to review

## 2018-04-04 NOTE — Progress Notes (Signed)
Subjective:    Patient ID: Erin Zavala, female    DOB: 24-May-1965, 53 y.o.   MRN: [id number]  HPI  ROV 04/04/18 --Erin Zavala is 53, former smoker, has history of sarcoidosis with associated interstitial lung disease, associated obstructive lung disease, history of ocular involvement.  She has severe secondary pulmonary hypertension and has been managed on oxygen (1 -3 liters per minute), Adcirca, Letairis, Tyvaso.  Bronchodilator regimen includes Symbicort bid, albuterol nebs about once a day.  She was treated for what sounds like an acute bronchitis, flare of obstructive lung disease in early December with a prednisone taper, now back down to 2.5 mg daily, her chronic dose. She has more nasal gtt, cough with clear to yellow, mostly at night. She has some globus sensation, on nexium qd. Not on anti-histamine right now.   She was seen 1/21 in the advanced CHF clinic, continued on Adcirca, ambrisentan, Tyvaso.  6-minute walk was deferred and is planned for next visit. Last CT 05/2017, last TTE 07/2017.    Review of Systems  Respiratory: Positive for shortness of breath.     Past Medical History:  Diagnosis Date  . Asthma    as a child  . Cough   . Dyspnea   . GERD (gastroesophageal reflux disease)   . Migraines   . Oxygen dependent   . Pulmonary hypertension ([place])   . Sarcoidosis      Family History  Problem Relation Age of Onset  . Hypertension Mother   . Hypertension Father   . Sarcoidosis Maternal Aunt   . Rheumatologic disease Neg Hx      Social History   Socioeconomic History  . Marital status: Married    Spouse name: Not on file  . Number of children: Not on file  . Years of education: Not on file  . Highest education level: Not on file  Occupational History  . Not on file  Social Needs  . Financial resource strain: Not on file  . Food insecurity:    Worry: Not on file    Inability: Not on file  . Transportation needs:    Medical: Not on file    Non-medical:  Not on file  Tobacco Use  . Smoking status: Former Smoker    Packs/day: 0.25    Years: 10.00    Pack years: 2.50    Types: Cigarettes    Last attempt to quit: 01/11/2011    Years since quitting: 7.2  . Smokeless tobacco: Never Used  Substance and Sexual Activity  . Alcohol use: No  . Drug use: No  . Sexual activity: Not on file  Lifestyle  . Physical activity:    Days per week: Not on file    Minutes per session: Not on file  . Stress: Not on file  Relationships  . Social connections:    Talks on phone: Not on file    Gets together: Not on file    Attends religious service: Not on file    Active member of club or organization: Not on file    Attends meetings of clubs or organizations: Not on file    Relationship status: Not on file  . Intimate partner violence:    Fear of current or ex partner: Not on file    Emotionally abused: Not on file    Physically abused: Not on file    Forced sexual activity: Not on file  Other Topics Concern  . Not on file  Social History Narrative  Patient denies any bird or mold exposure. Currently works with exposure to inhaled perfumes and chemicals. 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 Previous exposure to inhaled dust while working in [employer]. Denies any IV drug use or previous incarceration. No known tuberculosis exposure. She has never lived nor volunteered at a homeless shelter.     Allergies  Allergen Reactions  . No Known Allergies      Outpatient Medications Prior to Visit  Medication Sig Dispense Refill  . ADCIRCA 20 MG tablet Take 2 tablets (40 mg total) by mouth daily. 180 tablet 3  . albuterol (PROVENTIL HFA;VENTOLIN HFA) 108 (90 Base) MCG/ACT inhaler Inhale 2 puffs into the lungs every 4 (four) hours as needed for wheezing or shortness of breath. 1 Inhaler 5  . ambrisentan (LETAIRIS) 10 MG tablet Take 1 tablet (10 mg total) by mouth daily. 30 tablet 11    . aspirin 81 MG tablet Take 1 tablet (81 mg total) by mouth daily. 30 tablet 0  . atorvastatin (LIPITOR) 80 MG tablet Take 80 mg by mouth daily.    [name] esomeprazole (NEXIUM) 40 MG capsule TAKE 1 CAPSULE BY MOUTH EVERY DAY 30 capsule 3  . ferrous sulfate 325 (65 FE) MG tablet TAKE 1 TABLET (325 MG TOTAL) BY MOUTH 2 (TWO) TIMES DAILY WITH A MEAL. 60 tablet 5  . ipratropium-albuterol (DUONEB) 0.5-2.5 (3) MG/3ML SOLN Take 3 mLs by nebulization every 6 (six) hours as needed. 360 mL 11  . OXYGEN 2-3 lpm 24/7  [facility]    . predniSONE (DELTASONE) 2.5 MG tablet Take 1 tablet (2.5 mg total) by mouth daily with breakfast. 30 tablet 2  . SYMBICORT 160-4.5 MCG/ACT inhaler TAKE 2 PUFFS BY MOUTH TWICE A DAY 10.2 Inhaler 1  . Treprostinil (TYVASO) 0.6 MG/ML SOLN Inhale 18 mcg into the lungs 4 (four) times daily.    [name] trimethoprim-polymyxin b (POLYTRIM) ophthalmic solution Place 1 drop into both eyes 3 (three) times daily.    [name] atorvastatin (LIPITOR) 40 MG tablet TAKE 1 TABLET BY MOUTH EVERY DAY (Patient not taking: Reported on 04/04/2018) 30 tablet 11   No facility-administered medications prior to visit.         Objective:   Physical Exam Vitals:   04/04/18 0901  BP: 118/66  Pulse: 97  SpO2: 99%  Weight: 139 lb (63 kg)  Height: 5' (1.524 m)   Gen: Pleasant, well-nourished, in no distress  ENT: No lesions,  mouth clear,  oropharynx clear, some nasal congestion and sneezing  Neck: No JVD, no UA noise  Lungs: No use of accessory muscles, distant, soft insp and exp squeaks  Cardiovascular: RRR, no M, +S4  Musculoskeletal: No deformities, no cyanosis or clubbing  Neuro: alert, non focal  Skin: Warm, no lesions or rash     Assessment & Plan:  Cough She was treated for a recent bronchitis and noted that she has had several sick contacts with probable viral URIs over the last several months.  Her current cough, drainage, mucus production sound more subacute.  I think we need to try and treat GERD  more aggressively empirically, need to add antihistamine to her existing prednisone.  See if this helps with her upper airway irritation, chronic component of her cough.  GERD (gastroesophageal reflux disease) Increase Nexium to 40 mg twice a day  Chronic respiratory failure with hypoxia (HCC) Continue current oxygen at 1 to 3 L/min depending on her activity  Pulmonary sarcoidosis (  [place]) We will plan to repeat your high-resolution CT scan of the chest without contrast in March 2020 to follow sarcoidosis Please continue prednisone 2.5 mg daily Continue Symbicort 2 puffs twice a day.  Remember to rinse and gargle after you use this. Keep your albuterol nebulizer available to use 1 treatment up to every 4 hours if you needed it for shortness of breath, chest tightness, wheezing. Follow with Dr [name] in March after your CT scan of the chest to review  Pulmonary hypertension ([place]) Followed also by the advanced heart failure clinic.  Please continue [name] as you have been taking them. Note plans to repeat echocardiogram and 6-minute walk this summer.   Baltazar Apo, MD, PhD 04/04/2018, 9:29 AM [facility] [phone] or if no answer [phone]

## 2018-04-04 NOTE — Assessment & Plan Note (Signed)
Increase Nexium to 40 mg twice a day

## 2018-04-04 NOTE — Assessment & Plan Note (Signed)
She was treated for a recent bronchitis and noted that she has had several sick contacts with probable viral URIs over the last several months.  Her current cough, drainage, mucus production sound more subacute.  I think we need to try and treat GERD more aggressively empirically, need to add antihistamine to her existing prednisone.  See if this helps with her upper airway irritation, chronic component of her cough.

## 2018-04-04 NOTE — Patient Instructions (Signed)
We will plan to repeat your high-resolution CT scan of the chest without contrast in March 2020 to follow sarcoidosis Please continue prednisone 2.5 mg daily Continue Symbicort 2 puffs twice a day.  Remember to rinse and gargle after you use this. Keep your albuterol nebulizer available to use 1 treatment up to every 4 hours if you needed it for shortness of breath, chest tightness, wheezing. Please continue [name] as you have been taking them. Note plans to repeat echocardiogram and 6-minute walk this summer. Please continue your oxygen at 1 to 3 L/min at all times. Please increase Nexium to 40 mg twice a day until your next visit. Please start loratadine (Claritin) 10 mg once daily until next visit. Follow with Dr [name] in March after your CT scan of the chest to review

## 2018-04-04 NOTE — Assessment & Plan Note (Signed)
Continue current oxygen at 1 to 3 L/min depending on her activity

## 2018-04-11 ENCOUNTER — Encounter: Payer: Self-pay | Admitting: Nurse Practitioner

## 2018-04-11 ENCOUNTER — Ambulatory Visit (INDEPENDENT_AMBULATORY_CARE_PROVIDER_SITE_OTHER): Payer: Self-pay | Admitting: Nurse Practitioner

## 2018-04-11 VITALS — BP 122/80 | HR 118 | Ht 60.0 in | Wt 140.4 lb

## 2018-04-11 DIAGNOSIS — J9611 Chronic respiratory failure with hypoxia: Secondary | ICD-10-CM

## 2018-04-11 DIAGNOSIS — J209 Acute bronchitis, unspecified: Secondary | ICD-10-CM

## 2018-04-11 DIAGNOSIS — R062 Wheezing: Secondary | ICD-10-CM

## 2018-04-11 MED ORDER — DOXYCYCLINE HYCLATE 100 MG PO TABS: 100.0000 mg | ORAL_TABLET | Freq: Two times a day (BID) | ORAL | 0 refills | Status: DC

## 2018-04-11 MED ORDER — CETIRIZINE HCL 10 MG PO TABS: 10.0000 mg | ORAL_TABLET | Freq: Every day | ORAL | 0 refills | Status: DC

## 2018-04-11 MED ORDER — PREDNISONE 10 MG PO TABS: ORAL_TABLET | ORAL | 0 refills | Status: DC

## 2018-04-11 MED ORDER — METHYLPREDNISOLONE ACETATE 80 MG/ML IJ SUSP
80.0000 mg | Freq: Once | INTRAMUSCULAR | Status: AC
  Administered 2018-04-11: 80 mg via INTRAMUSCULAR

## 2018-04-11 MED ORDER — LEVALBUTEROL HCL 0.63 MG/3ML IN NEBU
0.6300 mg | INHALATION_SOLUTION | Freq: Once | RESPIRATORY_TRACT | Status: AC
  Administered 2018-04-11: 0.63 mg via RESPIRATORY_TRACT

## 2018-04-11 NOTE — Assessment & Plan Note (Signed)
Increased oxygen demand, requiring 3-5L  - O2 sat 98% today

## 2018-04-11 NOTE — Progress Notes (Addendum)
_0  ID: Erin Zavala, female    DOB: 1965/11/14, 53 y.o.   MRN: [id number]  Chief Complaint  Patient presents with  . Acute Visit    Referring provider: No ref. provider found  HPI  53 year old female former smoker with pulmonary sarcoidosis, respiratory failure with hypoxia, asthmatic bronchitis, chronic sinusitis is followed by Dr. [name].  Tests:  Imaging: CXR 02/17/18 - Extensive fibrotic type change, most severe in the upper lobe and left mid lung but present throughout the lungs bilaterally, indicative of changes secondary to sarcoidosis. No frank edema or consolidation. No evident adenopathy. Heart size normal.  6 minute walk:  SIX MIN WALK 10/08/2017 11/29/2016 11/29/2016 09/26/2016 09/26/2016 07/23/2016 05/18/2016  Medications - - Letairis 10. Aspirin 81, Lipitor 74m, Qvar 847m, Plavix 7553mNexium 15m16mrednisone 2.5mg 37mdirca 20mg 43mtaken at 7:12am ASA 81mg,l43mor 20mg,qv46m0,plavix 75mg,nex8m40,prednisone 2.5mg all t32mn between 8:30am-9am - - -  Supplimental Oxygen during Test? (L/min) Yes - Yes Yes No No Yes  O2 Flow Rate _1 - - 4  Type Continuous Continuous Continuous Continuous - - Continuous  Laps 12.5 - 6 6 - - -  Partial Lap (in Meters) - - 24 45 - - -  Baseline BP (sitting) - - 120/68 118/62 - - -  Baseline Heartrate 80 - 98 88 125 - 104  Baseline Dyspnea (Borg Scale) - - 0 0 - - -  Baseline Fatigue (Borg Scale) - - 0 0 - - -  Baseline SPO2 90 - 100 100 88 - 92  BP (sitting) - 126/70 - 122/64 - - -  Heartrate 134 125 - 128 - - 111  Dyspnea (Borg Scale) - 2 - 2 - - -  Fatigue (Borg Scale) - 0 - 0 - - -  SPO2 88 95 - 89 - - 90  BP (sitting) - 122/68 - 120/60 - - -  Heartrate 98 93 - 87 - - 98  SPO2 90 100 - 100 - - 96  Stopped or Paused before Six Minutes No Yes - Yes - - Yes  Other Symptoms at end of Exercise - test paused with 1:50 left at 273 meters to increase oxygen to 3L due to pt's O2 dropping to 87 HR 144-  test resumed once O2 increased  to 94% HR 116. - Pt walked 2 full laps (48 meters) before she was stopped due to sats dropping to 86% on 2L she had 3 mins and 38 secs left. She was then bumped up to 3L sats 95% and walk proceeded - - Patient started walk on 2 L O2, desatted to 70's after 2-3 minutes, O2 up to 4L, sats came to 90's. Minimally SOB, toelrated well, steady gait and pace.  Distance Completed - - 312 333 - - -  Tech Comments: Patient ambulated 1250 ft (381 meters) with brisk ambulation, no rest breask required, minimal dyspnea.  Patient tolerated very well.  Started on 2.5 L continuous O2 and had to increase to 3.5 L after desatting with amb to 84%.  Dr. McLean madAundra Dubine. pt completed test at moderated pace with no complaints. test performed with forehead probe. test paused with 1:50 left at 273 meters left to increase oxygen to 3L due to pt's O2 dropping to 87 HR 144- O2 increased to 94% HR 116 on 3L. O2 remained over 90% remaining of test. TOTAL 312 - Pt walked a steady pace with her oxygen tank, she did not express any dyspnea during  or after the walk nor did she express any discomfort  Pt sats were on RA at rest while in lobby were 93% 113 HR, Pt remained off oxygen and walked to back hallway for walk and sats dropped to 88% HR 125, from there was started on oxygen  Patient walked from scale to room 9A with steady gait and moderate pace.  256 meters (840 ft)   PFT:  PFT Results Latest Ref Rng & Units 06/26/2017 11/28/2016 08/23/2016 12/27/2015  FVC-Pre L 1.61 1.69 1.70 1.53  FVC-Predicted Pre % 66 68 69 61  FVC-Post L 1.67 - - 1.56  FVC-Predicted Post % 68 - - 63  Pre FEV1/FVC % % 84 87 87 79  Post FEV1/FCV % % 87 - - 74  FEV1-Pre L 1.35 1.46 1.49 1.21  FEV1-Predicted Pre % 69 74 75 60  FEV1-Post L 1.46 - - 1.16  DLCO UNC% % 34 _0 DLCO COR %Predicted % 59 51 54 47  TLC L 2.80 - - 2.62  TLC % Predicted % 62 - - 58  RV % Predicted % 78 - - 70   OV - Acute cough  Patient presents today with acute cough with  yellow sputum that started this past Tuesday.  Associated nasal congestion/discharge, shortness of breath and wheezing.  She vomited yesterday due to cough.  Continuous oxygen at home.  compliant with Symbicort.  States that her symptoms are progressively worsening.  She denies any fever today.  Vital signs are stable in office today.  Patient states that she needs a note to be out of work  today.    Allergies  Allergen Reactions  . No Known Allergies     Immunization History  Administered Date(s) Administered  . Influenza,inj,Quad PF,6+ Mos 02/28/2016, 12/18/2016, 01/02/2018  . PPD Test 02/28/2016  . Pneumococcal Conjugate-13 11/29/2016  . Pneumococcal Polysaccharide-23 11/03/2015    Past Medical History:  Diagnosis Date  . Asthma    as a child  . Cough   . Dyspnea   . GERD (gastroesophageal reflux disease)   . Migraines   . Oxygen dependent   . Pulmonary hypertension ([place])   . Sarcoidosis     Tobacco History: Social History   Tobacco Use  Smoking Status Former Smoker  . Packs/day: 0.25  . Years: 10.00  . Pack years: 2.50  . Types: Cigarettes  . Last attempt to quit: 01/11/2011  . Years since quitting: 7.2  Smokeless Tobacco Never Used   Counseling given: Not Answered   Outpatient Encounter Medications as of 04/11/2018  Medication Sig  . ADCIRCA 20 MG tablet Take 2 tablets (40 mg total) by mouth daily.  [name] albuterol (PROVENTIL HFA;VENTOLIN HFA) 108 (90 Base) MCG/ACT inhaler Inhale 2 puffs into the lungs every 4 (four) hours as needed for wheezing or shortness of breath.  [name] ambrisentan (LETAIRIS) 10 MG tablet Take 1 tablet (10 mg total) by mouth daily.  [name] aspirin 81 MG tablet Take 1 tablet (81 mg total) by mouth daily.  [name] atorvastatin (LIPITOR) 80 MG tablet Take 80 mg by mouth daily.  [name] esomeprazole (NEXIUM) 40 MG capsule TAKE 1 CAPSULE BY MOUTH EVERY DAY  . ferrous sulfate 325 (65 FE) MG tablet TAKE 1 TABLET (325 MG TOTAL) BY MOUTH 2 (TWO) TIMES DAILY WITH A MEAL.    [name] ipratropium-albuterol (DUONEB) 0.5-2.5 (3) MG/3ML SOLN Take 3 mLs by nebulization every 6 (six) hours as needed.  . OXYGEN 2-3 lpm 24/7  [facility]  .  predniSONE (DELTASONE) 2.5 MG tablet Take 1 tablet (2.5 mg total) by mouth daily with breakfast.  . SYMBICORT 160-4.5 MCG/ACT inhaler TAKE 2 PUFFS BY MOUTH TWICE A DAY  . Treprostinil (TYVASO) 0.6 MG/ML SOLN Inhale 18 mcg into the lungs 4 (four) times daily.  [name] trimethoprim-polymyxin b (POLYTRIM) ophthalmic solution Place 1 drop into both eyes 3 (three) times daily.  . cetirizine (ZYRTEC ALLERGY) 10 MG tablet Take 1 tablet (10 mg total) by mouth daily for 30 days.  [name] doxycycline (VIBRA-TABS) 100 MG tablet Take 1 tablet (100 mg total) by mouth 2 (two) times daily.  . predniSONE (DELTASONE) 10 MG tablet Take 4 tabs for 2 days, then 3 tabs for 2 days, then 2 tabs for 2 days, then 1 tab for 2 days, then stop  . [EXPIRED] levalbuterol (XOPENEX) nebulizer solution 0.63 mg   . [EXPIRED] methylPREDNISolone acetate (DEPO-MEDROL) injection 80 mg    No facility-administered encounter medications on file as of 04/11/2018.      Review of Systems  Review of Systems  Constitutional: Negative.  Negative for chills and fever.  HENT: Negative.   Respiratory: Positive for cough, shortness of breath and wheezing.   Cardiovascular: Negative.  Negative for chest pain, palpitations and leg swelling.  Gastrointestinal: Negative.   Allergic/Immunologic: Negative.   Neurological: Negative.   Psychiatric/Behavioral: Negative.        Physical Exam  BP 122/80 (BP Location: Left Arm, Cuff Size: Normal)   Pulse (!) 118   Ht 5' (1.524 m)   Wt 140 lb 6.4 oz (63.7 kg)   SpO2 92%   BMI 27.42 kg/m   Wt Readings from Last 5 Encounters:  04/11/18 140 lb 6.4 oz (63.7 kg)  04/04/18 139 lb (63 kg)  04/01/18 138 lb 3.2 oz (62.7 kg)  02/17/18 138 lb (62.6 kg)  01/02/18 140 lb (63.5 kg)     Physical Exam Vitals signs and nursing note reviewed.  Constitutional:       General: She is not in acute distress.    Appearance: She is well-developed.  Cardiovascular:     Rate and Rhythm: Normal rate and regular rhythm.  Pulmonary:     Effort: Pulmonary effort is normal. No respiratory distress.     Breath sounds: Wheezing present. No rhonchi.  Neurological:     Mental Status: She is alert and oriented to person, place, and time.        Assessment & Plan:   Chronic respiratory failure with hypoxia (HCC) Increased oxygen demand, requiring 3-5L  - O2 sat 98% today  Acute bronchitis Will treat for asthmatic bronchitis  Patient Instructions  Xopenex given in office today DepoMedrol given in office today Will order doxycycline Will order prednisone taper - then restart daily prednisone Continue Symbicort  Start zyrtec daily Continue current oxygen at 1 to 3 L/min depending on her activity Continue Proventil as needed Follow up in 1 week or sooner if needed Go to the ED this weekend if symptoms worsen         Fenton Foy, NP 04/11/2018

## 2018-04-11 NOTE — Assessment & Plan Note (Signed)
Will treat for asthmatic bronchitis  Patient Instructions  Xopenex given in office today DepoMedrol given in office today Will order doxycycline Will order prednisone taper - then restart daily prednisone Continue Symbicort  Start zyrtec daily Continue current oxygen at 1 to 3 L/min depending on her activity Continue Proventil as needed Follow up in 1 week or sooner if needed Go to the ED this weekend if symptoms worsen

## 2018-04-11 NOTE — Patient Instructions (Addendum)
Xopenex given in office today DepoMedrol given in office today Will order doxycycline Will order prednisone taper - then restart daily prednisone Continue Symbicort  Start zyrtec daily Continue current oxygen at 1 to 3 L/min depending on her activity Continue Proventil as needed Follow up in 1 week or sooner if needed Go to the ED this weekend if symptoms worsen

## 2018-04-21 ENCOUNTER — Ambulatory Visit: Payer: Self-pay | Admitting: Nurse Practitioner

## 2018-04-23 ENCOUNTER — Ambulatory Visit: Payer: Self-pay | Admitting: Nurse Practitioner

## 2018-04-25 ENCOUNTER — Ambulatory Visit (INDEPENDENT_AMBULATORY_CARE_PROVIDER_SITE_OTHER): Payer: Self-pay | Admitting: Nurse Practitioner

## 2018-04-25 ENCOUNTER — Encounter: Payer: Self-pay | Admitting: Nurse Practitioner

## 2018-04-25 VITALS — BP 110/70 | HR 83 | Ht 60.0 in | Wt 141.8 lb

## 2018-04-25 DIAGNOSIS — D869 Sarcoidosis, unspecified: Secondary | ICD-10-CM

## 2018-04-25 DIAGNOSIS — J9611 Chronic respiratory failure with hypoxia: Secondary | ICD-10-CM

## 2018-04-25 DIAGNOSIS — D860 Sarcoidosis of lung: Secondary | ICD-10-CM

## 2018-04-25 DIAGNOSIS — J209 Acute bronchitis, unspecified: Secondary | ICD-10-CM

## 2018-04-25 MED ORDER — PREDNISONE 5 MG PO TABS: 5.0000 mg | ORAL_TABLET | Freq: Every day | ORAL | 0 refills | Status: AC

## 2018-04-25 NOTE — Assessment & Plan Note (Signed)
Patient returns today for follow-up and is doing much better.  She stated that she had a slight cough and slight shortness of breath return after her prednisone taper when she went back to 2.5 mg of prednisone daily.  We discussed that she could trial 5 mg of prednisone daily until her follow-up with Dr. [name] next month.  She is also scheduled to get a follow-up high-resolution CT done before her next visit with Dr. [name].   Patient Instructions  Plan to repeat your high-resolution CT scan of the chest without contrast in March 2020 to follow sarcoidosis Will increase prednisone to 5 mg daily until follow up visit with Dr. [name] in March Continue Symbicort 2 puffs twice a day.  Remember to rinse and gargle after you use this. Keep your albuterol nebulizer available to use 1 treatment up to every 4 hours if you needed it for shortness of breath, chest tightness, wheezing. Please continue [name] as you have been taking them. Dr. [name] plans to repeat echocardiogram and 6-minute walk this summer. Please continue your oxygen at 1 to 3 L/min at all times. Continue Nexium to 40 mg twice a day until your next visit. Continue loratadine (Claritin) 10 mg once daily until next visit. Follow with Dr [name] in March after your CT scan of the chest to review as scheduled

## 2018-04-25 NOTE — Assessment & Plan Note (Signed)
Resolved. Patient is doing well.

## 2018-04-25 NOTE — Assessment & Plan Note (Signed)
Continue continuous oxygen as needed 1 to 3 L depending on activity level.

## 2018-04-25 NOTE — Patient Instructions (Signed)
Plan to repeat your high-resolution CT scan of the chest without contrast in March 2020 to follow sarcoidosis Will increase prednisone to 5 mg daily until follow up visit with Dr. [name] in March Continue Symbicort 2 puffs twice a day.  Remember to rinse and gargle after you use this. Keep your albuterol nebulizer available to use 1 treatment up to every 4 hours if you needed it for shortness of breath, chest tightness, wheezing. Please continue [name] as you have been taking them. Dr. [name] plans to repeat echocardiogram and 6-minute walk this summer. Please continue your oxygen at 1 to 3 L/min at all times. Continue Nexium to 40 mg twice a day until your next visit. Continue loratadine (Claritin) 10 mg once daily until next visit. Follow with Dr [name] in March after your CT scan of the chest to review as scheduled

## 2018-04-25 NOTE — Progress Notes (Signed)
_0  ID: Erin Zavala, female    DOB: 1965-07-09, 53 y.o.   MRN: [id number]  Chief Complaint  Patient presents with  . Follow-up    States she is feeling better but her cough is coming back with cloudy muous. She states she is still wheezing a little. She states whenever she goes back to 2.53m prednisone her cough comes back. She thinks the 2.516mmay need to be increased.     Referring provider: No ref. provider found  HPI [age]ear old female former smoker with pulmonary sarcoidosis, respiratory failure with hypoxia, asthmatic bronchitis, chronic sinusitis is followed by Dr. ByLamonte Sakai Tests:  Imaging: CXR 02/17/18 - Extensive fibrotic type change, most severe in the upper lobe and left mid lung but present throughout the lungs bilaterally, indicative of changes secondary to sarcoidosis. No frank edema or consolidation. No evident adenopathy. Heart size normal.  6-minute walks:  SIX MIN WALK 10/08/2017 11/29/2016 11/29/2016 09/26/2016 09/26/2016 07/23/2016 05/18/2016  Medications - - Letairis 10. Aspirin 81, Lipitor 4048mQvar 54m16mPlavix 75mg64mxium 40mg,6mdnisone 2.5mg & 12mrca 20mg al28mken at 7:12am ASA 81mg,lip52m 20mg,qvar7mplavix 75mg,nexiu71m,prednisone 2.5mg all tak77mbetween 8:30am-9am - - -  Supplimental Oxygen during Test? (L/min) Yes - Yes Yes No No Yes  O2 Flow Rate _1 - - 4  Type Continuous Continuous Continuous Continuous - - Continuous  Laps 12.5 - 6 6 - - -  Partial Lap (in Meters) - - 24 45 - - -  Baseline BP (sitting) - - 120/68 118/62 - - -  Baseline Heartrate 80 - 98 88 125 - 104  Baseline Dyspnea (Borg Scale) - - 0 0 - - -  Baseline Fatigue (Borg Scale) - - 0 0 - - -  Baseline SPO2 90 - 100 100 88 - 92  BP (sitting) - 126/70 - 122/64 - - -  Heartrate 134 125 - 128 - - 111  Dyspnea (Borg Scale) - 2 - 2 - - -  Fatigue (Borg Scale) - 0 - 0 - - -  SPO2 88 95 - 89 - - 90  BP (sitting) - 122/68 - 120/60 - - -  Heartrate 98 93 - 87 - - 98  SPO2 90 100  - 100 - - 96  Stopped or Paused before Six Minutes No Yes - Yes - - Yes  Other Symptoms at end of Exercise - test paused with 1:50 left at 273 meters to increase oxygen to 3L due to pt's O2 dropping to 87 HR 144-  test resumed once O2 increased to 94% HR 116. - Pt walked 2 full laps (48 meters) before she was stopped due to sats dropping to 86% on 2L she had 3 mins and 38 secs left. She was then bumped up to 3L sats 95% and walk proceeded - - Patient started walk on 2 L O2, desatted to 70's after 2-3 minutes, O2 up to 4L, sats came to 90's. Minimally SOB, toelrated well, steady gait and pace.  Distance Completed - - 312 333 - - -  Tech Comments: Patient ambulated 1250 ft (381 meters) with brisk ambulation, no rest breask required, minimal dyspnea.  Patient tolerated very well.  Started on 2.5 L continuous O2 and had to increase to 3.5 L after desatting with amb to 84%.  Dr. McLean made [name] pt completed test at moderated pace with no complaints. test performed with forehead probe. test paused with 1:50 left at 273 meters left to increase oxygen  to 3L due to pt's O2 dropping to 87 HR 144- O2 increased to 94% HR 116 on 3L. O2 remained over 90% remaining of test. TOTAL 312 - Pt walked a steady pace with her oxygen tank, she did not express any dyspnea during or after the walk nor did she express any discomfort  Pt sats were on RA at rest while in lobby were 93% 113 HR, Pt remained off oxygen and walked to back hallway for walk and sats dropped to 88% HR 125, from there was started on oxygen  Patient walked from scale to room 9A with steady gait and moderate pace.  256 meters (840 ft)   PFT: PFT Results Latest Ref Rng & Units 06/26/2017 11/28/2016 08/23/2016 12/27/2015  FVC-Pre L 1.61 1.69 1.70 1.53  FVC-Predicted Pre % 66 68 69 61  FVC-Post L 1.67 - - 1.56  FVC-Predicted Post % 68 - - 63  Pre FEV1/FVC % % 84 87 87 79  Post FEV1/FCV % % 87 - - 74  FEV1-Pre L 1.35 1.46 1.49 1.21  FEV1-Predicted Pre % 69 74  75 60  FEV1-Post L 1.46 - - 1.16  DLCO UNC% % 34 _0 DLCO COR %Predicted % 59 51 54 47  TLC L 2.80 - - 2.62  TLC % Predicted % 62 - - 58  RV % Predicted % 78 - - 70   Last OV with TN on 04/04/18: Plan: Chronic respiratory failure with hypoxia (HCC) Increased oxygen demand, requiring 3-5L  - O2 sat 98% today Acute bronchitis Will treat for asthmatic bronchitis  Patient Instructions  Xopenex given in office today DepoMedrol given in office today Will order doxycycline Will order prednisone taper - then restart daily prednisone Continue Symbicort  Start zyrtec daily Continue current oxygen at 1 to 3 L/min depending on her activity Continue Proventil as needed Follow up in 1 week or sooner if needed Go to the ED this weekend if symptoms worsen   OV 04-25-18 - Follow up Patient presents today for a follow-up.  She was last seen by me on 04/11/2018 for acute bronchitis.  She was given a prednisone taper and doxycycline.  She states that she feels much better.  She is on oxygen at 1 to 3 L nasal cannula continuously.  She is compliant with her Symbicort.  She has returned to work.  She states that she has noticed that her cough has returned after she went down to her daily dose of 2.5 mg of prednisone daily after prednisone taper.  She denies any recent fever.  She denies any chest pain or edema.    Allergies  Allergen Reactions  . No Known Allergies     Immunization History  Administered Date(s) Administered  . Influenza,inj,Quad PF,6+ Mos 02/28/2016, 12/18/2016, 01/02/2018  . PPD Test 02/28/2016  . Pneumococcal Conjugate-13 11/29/2016  . Pneumococcal Polysaccharide-23 11/03/2015    Past Medical History:  Diagnosis Date  . Asthma    as a child  . Cough   . Dyspnea   . GERD (gastroesophageal reflux disease)   . Migraines   . Oxygen dependent   . Pulmonary hypertension ([place])   . Sarcoidosis     Tobacco History: Social History   Tobacco Use  Smoking Status  Former Smoker  . Packs/day: 0.25  . Years: 10.00  . Pack years: 2.50  . Types: Cigarettes  . Last attempt to quit: 01/11/2011  . Years since quitting: 7.2  Smokeless Tobacco Never Used  Counseling given: Not Answered   Outpatient Encounter Medications as of 04/25/2018  Medication Sig  . ADCIRCA 20 MG tablet Take 2 tablets (40 mg total) by mouth daily.  [name] albuterol (PROVENTIL HFA;VENTOLIN HFA) 108 (90 Base) MCG/ACT inhaler Inhale 2 puffs into the lungs every 4 (four) hours as needed for wheezing or shortness of breath.  [name] ambrisentan (LETAIRIS) 10 MG tablet Take 1 tablet (10 mg total) by mouth daily.  [name] aspirin 81 MG tablet Take 1 tablet (81 mg total) by mouth daily.  [name] atorvastatin (LIPITOR) 80 MG tablet Take 80 mg by mouth daily.  . cetirizine (ZYRTEC ALLERGY) 10 MG tablet Take 1 tablet (10 mg total) by mouth daily for 30 days.  [name] esomeprazole (NEXIUM) 40 MG capsule TAKE 1 CAPSULE BY MOUTH EVERY DAY  . ferrous sulfate 325 (65 FE) MG tablet TAKE 1 TABLET (325 MG TOTAL) BY MOUTH 2 (TWO) TIMES DAILY WITH A MEAL.  [name] ipratropium-albuterol (DUONEB) 0.5-2.5 (3) MG/3ML SOLN Take 3 mLs by nebulization every 6 (six) hours as needed.  . OXYGEN 2-3 lpm 24/7  [facility]  . SYMBICORT 160-4.5 MCG/ACT inhaler TAKE 2 PUFFS BY MOUTH TWICE A DAY  . Treprostinil (TYVASO) 0.6 MG/ML SOLN Inhale 18 mcg into the lungs 4 (four) times daily.  [name] trimethoprim-polymyxin b (POLYTRIM) ophthalmic solution Place 1 drop into both eyes 3 (three) times daily.  . [DISCONTINUED] predniSONE (DELTASONE) 2.5 MG tablet Take 1 tablet (2.5 mg total) by mouth daily with breakfast.  . predniSONE (DELTASONE) 5 MG tablet Take 1 tablet (5 mg total) by mouth daily with breakfast for 30 days.  . [DISCONTINUED] doxycycline (VIBRA-TABS) 100 MG tablet Take 1 tablet (100 mg total) by mouth 2 (two) times daily. (Patient not taking: Reported on 04/25/2018)  . [DISCONTINUED] predniSONE (DELTASONE) 10 MG tablet Take 4 tabs for 2 days, then 3 tabs for  2 days, then 2 tabs for 2 days, then 1 tab for 2 days, then stop (Patient not taking: Reported on 04/25/2018)   No facility-administered encounter medications on file as of 04/25/2018.      Review of Systems  Review of Systems  Constitutional: Negative.  Negative for chills and fever.  HENT: Negative.   Respiratory: Positive for cough and shortness of breath.   Cardiovascular: Negative.  Negative for chest pain, palpitations and leg swelling.  Gastrointestinal: Negative.   Allergic/Immunologic: Negative.   Neurological: Negative.   Psychiatric/Behavioral: Negative.        Physical Exam  BP 110/70 (BP Location: Left Arm)   Pulse 83   Ht 5' (1.524 m)   Wt 141 lb 12.8 oz (64.3 kg)   SpO2 98%   BMI 27.69 kg/m   Wt Readings from Last 5 Encounters:  04/25/18 141 lb 12.8 oz (64.3 kg)  04/11/18 140 lb 6.4 oz (63.7 kg)  04/04/18 139 lb (63 kg)  04/01/18 138 lb 3.2 oz (62.7 kg)  02/17/18 138 lb (62.6 kg)     Physical Exam Vitals signs and nursing note reviewed.  Constitutional:      General: She is not in acute distress.    Appearance: She is well-developed.  Cardiovascular:     Rate and Rhythm: Normal rate and regular rhythm.  Pulmonary:     Effort: Pulmonary effort is normal. No respiratory distress.     Breath sounds: Normal breath sounds. No wheezing or rhonchi.  Musculoskeletal:        General: No swelling.  Neurological:     Mental Status: She is  alert and oriented to person, place, and time.        Assessment & Plan:   Acute bronchitis Resolved. Patient is doing well.   Pulmonary sarcoidosis [facility]) Patient returns today for follow-up and is doing much better.  She stated that she had a slight cough and slight shortness of breath return after her prednisone taper when she went back to 2.5 mg of prednisone daily.  We discussed that she could trial 5 mg of prednisone daily until her follow-up with Dr. [name] next month.  She is also scheduled to get a follow-up  high-resolution CT done before her next visit with Dr. [name].   Patient Instructions  Plan to repeat your high-resolution CT scan of the chest without contrast in March 2020 to follow sarcoidosis Will increase prednisone to 5 mg daily until follow up visit with Dr. [name] in March Continue Symbicort 2 puffs twice a day.  Remember to rinse and gargle after you use this. Keep your albuterol nebulizer available to use 1 treatment up to every 4 hours if you needed it for shortness of breath, chest tightness, wheezing. Please continue [name] as you have been taking them. Dr. [name] plans to repeat echocardiogram and 6-minute walk this summer. Please continue your oxygen at 1 to 3 L/min at all times. Continue Nexium to 40 mg twice a day until your next visit. Continue loratadine (Claritin) 10 mg once daily until next visit. Follow with Dr [name] in March after your CT scan of the chest to review as scheduled    Chronic respiratory failure with hypoxia ([place]) Continue continuous oxygen as needed 1 to 3 L depending on activity level.     Fenton Foy, NP 04/25/2018

## 2018-05-03 ENCOUNTER — Other Ambulatory Visit: Payer: Self-pay | Admitting: Nurse Practitioner

## 2018-05-22 ENCOUNTER — Telehealth: Payer: Self-pay | Admitting: Emergency Medicine

## 2018-05-22 NOTE — Telephone Encounter (Signed)
Patient is returning call, states he would like recommendations regarding extra precautions pertaining to the [facility] Virus.   Call back # [phone]

## 2018-05-22 NOTE — Telephone Encounter (Signed)
Called patient unable to reach LMTCB 

## 2018-05-22 NOTE — Telephone Encounter (Signed)
Called and spoke with patient, advised her of all precautions to take. Wash hands, use sanitizer, cough and sneeze into elbows. Cover mouth, wear a mask if needed. Stay away from highly populated crowds if necessary. Patient verbalized understanding and nothing further needed.

## 2018-05-23 ENCOUNTER — Other Ambulatory Visit: Payer: Self-pay | Admitting: Emergency Medicine

## 2018-05-30 ENCOUNTER — Telehealth: Payer: Self-pay | Admitting: Emergency Medicine

## 2018-05-30 NOTE — Telephone Encounter (Signed)
LMTCB

## 2018-06-02 ENCOUNTER — Other Ambulatory Visit: Payer: Self-pay

## 2018-06-02 ENCOUNTER — Other Ambulatory Visit (HOSPITAL_COMMUNITY): Payer: Self-pay

## 2018-06-02 ENCOUNTER — Ambulatory Visit (HOSPITAL_COMMUNITY)
Admission: RE | Admit: 2018-06-02 | Discharge: 2018-06-02 | Disposition: A | Discharge status: Home / Self Care | Payer: Self-pay | Source: Ambulatory Visit | Attending: Internal Medicine | Admitting: Internal Medicine

## 2018-06-02 DIAGNOSIS — I272 Pulmonary hypertension, unspecified: Secondary | ICD-10-CM

## 2018-06-02 LAB — LIPID PANEL
Cholesterol: 169 mg/dL (ref 0–200)
HDL: 65 mg/dL (ref >40)
LDL Cholesterol: 67 mg/dL (ref 0–99)
Total CHOL/HDL Ratio: 2.6 RATIO
Triglycerides: 185 mg/dL — ABNORMAL HIGH (ref <150)
VLDL: 37 mg/dL (ref 0–40)

## 2018-06-02 LAB — HEPATIC FUNCTION PANEL
ALT: 27 U/L (ref 0–44)
AST: 37 U/L (ref 15–41)
Albumin: 3.2 g/dL — ABNORMAL LOW (ref 3.5–5.0)
Alkaline Phosphatase: 80 U/L (ref 38–126)
Bilirubin, Direct: <0.1 mg/dL (ref 0.0–0.2)
TOTAL PROTEIN: 6.7 g/dL (ref 6.5–8.1)
Total Bilirubin: 0.3 mg/dL (ref 0.3–1.2)

## 2018-06-02 NOTE — Telephone Encounter (Signed)
Pt returning call [phone]//kob

## 2018-06-02 NOTE — Telephone Encounter (Signed)
Unable to reach and unable to leave VM as phone rang then went to busy signal

## 2018-06-02 NOTE — Progress Notes (Signed)
Orders place for labs 06/02/2018

## 2018-06-03 ENCOUNTER — Ambulatory Visit (INDEPENDENT_AMBULATORY_CARE_PROVIDER_SITE_OTHER)
Admission: RE | Admit: 2018-06-03 | Discharge: 2018-06-03 | Disposition: A | Discharge status: Home / Self Care | Payer: Self-pay | Source: Ambulatory Visit | Attending: Nurse Practitioner | Admitting: Nurse Practitioner

## 2018-06-03 DIAGNOSIS — D869 Sarcoidosis, unspecified: Secondary | ICD-10-CM

## 2018-06-03 NOTE — Telephone Encounter (Signed)
Pt is calling back [phone]

## 2018-06-03 NOTE — Telephone Encounter (Signed)
Returned call to patient. She has decided to remain out of work due to possible exposure at work. Results still pending and she is scared due to medical history. She want to know if a work note can be provided?

## 2018-06-04 NOTE — Telephone Encounter (Signed)
Called patient unable to reach LMTCB 

## 2018-06-04 NOTE — Telephone Encounter (Signed)
Pt is calling back for an update. Cb is [phone].

## 2018-06-05 ENCOUNTER — Encounter: Payer: Self-pay | Admitting: *Deleted

## 2018-06-05 NOTE — Telephone Encounter (Signed)
I called patient to r/s her appt - pt still needing letter to advise that patient is in a high risk category for her work- she is currently out of work - She would like work note faxed to [phone]. She can be reached at [phone]

## 2018-06-05 NOTE — Telephone Encounter (Signed)
RB please advise. Thanks.  

## 2018-06-05 NOTE — Telephone Encounter (Signed)
Yes please write letter that patient is high risk.  I will be glad to sign this  As far as her exposure goes current recommendations are no quarantine orders for close contacts to confirm case.  Persons who have had close contact with a person with respiratory illness or is encouraged to stay home to the extent possible and monitor themselves for signs and symptoms.  If she develops symptoms she should call us immediately for further recommendations.

## 2018-06-05 NOTE — Telephone Encounter (Signed)
Called and spoke with pt. Pt stated that a coworker was exposed to coronavirus and she is needing to know what precautions she needs to take due to the exposure with the coworker.  Pt also is wanting to know if it would be okay for a work note to be written for her; she states she is already at home due to the exposure and with her being on O2 but wants to know if a letter can also be written as well.  Tammy, please advise. Thanks!

## 2018-06-05 NOTE — Telephone Encounter (Signed)
Letter has been written for pt. Called and spoke with pt letting her know this had been done and pt asked if I could fax it to her at [phone] and I stated to pt that I would. Stated to pt to closely monitor her symptoms and if she started developing any symptoms (increased SOB, chest tightness, fever, cough, etc) to call office immediately. Pt expressed understanding. Nothing further needed.

## 2018-06-09 ENCOUNTER — Other Ambulatory Visit: Payer: Self-pay | Admitting: Emergency Medicine

## 2018-06-10 ENCOUNTER — Ambulatory Visit: Payer: Self-pay | Admitting: Emergency Medicine

## 2018-06-12 ENCOUNTER — Telehealth (HOSPITAL_COMMUNITY): Payer: Self-pay | Admitting: Licensed Clinical Social Worker

## 2018-06-12 NOTE — Telephone Encounter (Signed)
Notification received that pt has been approved to receive Letaris through [place] patient assistance [employer]  Approval 06/10/18-03/12/19  CSW called pt to inform  CSW will continue to follow and assist as needed  Jorge Ny, [facility] Desk#: [phone] Cell#: [phone]

## 2018-06-19 ENCOUNTER — Telehealth: Payer: Self-pay | Admitting: Emergency Medicine

## 2018-06-19 MED ORDER — CETIRIZINE HCL 10 MG PO TABS: 10.0000 mg | ORAL_TABLET | Freq: Every day | ORAL | 5 refills | Status: DC

## 2018-06-19 MED ORDER — PREDNISONE 2.5 MG PO TABS: 2.5000 mg | ORAL_TABLET | Freq: Every day | ORAL | 5 refills | Status: DC

## 2018-06-19 NOTE — Telephone Encounter (Signed)
Prednisone and zyrtec Rx have been sent to pt's preferred pharmacy. Called and spoke with pt letting her know this had been done. Pt expressed understanding. Nothing further needed.

## 2018-06-24 ENCOUNTER — Ambulatory Visit (INDEPENDENT_AMBULATORY_CARE_PROVIDER_SITE_OTHER): Payer: Self-pay | Admitting: Adult Health

## 2018-06-24 ENCOUNTER — Telehealth: Payer: Self-pay | Admitting: Emergency Medicine

## 2018-06-24 ENCOUNTER — Other Ambulatory Visit: Payer: Self-pay

## 2018-06-24 ENCOUNTER — Encounter: Payer: Self-pay | Admitting: Adult Health

## 2018-06-24 DIAGNOSIS — J9611 Chronic respiratory failure with hypoxia: Secondary | ICD-10-CM

## 2018-06-24 DIAGNOSIS — I272 Pulmonary hypertension, unspecified: Secondary | ICD-10-CM

## 2018-06-24 DIAGNOSIS — J209 Acute bronchitis, unspecified: Secondary | ICD-10-CM

## 2018-06-24 DIAGNOSIS — J309 Allergic rhinitis, unspecified: Secondary | ICD-10-CM

## 2018-06-24 MED ORDER — PREDNISONE 10 MG PO TABS: ORAL_TABLET | ORAL | 0 refills | Status: DC

## 2018-06-24 MED ORDER — AMOXICILLIN-POT CLAVULANATE 875-125 MG PO TABS: 1.0000 | ORAL_TABLET | Freq: Two times a day (BID) | ORAL | 0 refills | Status: AC

## 2018-06-24 NOTE — Patient Instructions (Addendum)
Augmentin 865m Twice daily  For 7 days , take with food.  Mucinex DM Twice daily  As needed   Prednisone taper over next week then resume prednisone 2.546mdaily .  Continue on Zyrtec 1059maily  Add Flonase Nasal 2 puffs daily  Continue on Oxygen 3l/m .  Follow up as planned and As needed   Please contact office for sooner follow up if symptoms do not improve or worsen or seek emergency care

## 2018-06-24 NOTE — Telephone Encounter (Signed)
Call returned to patient, she states is having nose and chest congestion, increased SOB, increased wheezing, cough with green mucous, denies fever. She reports her SOB and wheezing is not much different than her baseline. States during the day her nose will run. States she feels very stuffy and would like something called in. Denies any recent travel or having been around anyone sick. Denies trying anything OTC. States she has been taking her zyrtec daily and it has not been been very helpful. Reports using Symbicort daily and neb as needed. She states it is helping with the SOB and wheezing but not the congestion. Requesting something be called in.   Beth please advise.

## 2018-06-24 NOTE — Progress Notes (Signed)
Virtual Visit via Telephone Note  I connected with Erin Zavala on 06/24/18 at  2:30 PM EDT by telephone and verified that I am speaking with the correct person using two identifiers.   I discussed the limitations, risks, security and privacy concerns of performing an evaluation and management service by telephone and the availability of in person appointments. I also discussed with the patient that there may be a patient responsible charge related to this service. The patient expressed understanding and agreed to proceed.   History of Present Illness: Today's tele-visit is for an acute office visit for cough Patient is present for today's visit at home, myself is present for today's visit at office  53 year old female former smoker followed for sarcoidosis with associated ILD. , ocular involvement and COPD . She is on Symbicort Twice daily  And prednisone 2.67m . She has severe pulmonary HTN . She is on [place], and Tyvaso.  Complains of 5 days increased cough, congestion ,dsypnea, wheezing , sinus congestion and pressure. Coughing thick mucus that is white to green. Has a lot of nasal congestion with green mucus .  No fever. No increased edema or orthopnea.  She remains on chronic oxygen 3l/m . O2 sats at home today after walking , 96% (on 3l/m ) .  Has good appetite with no n/v/d.   She has been staying home for last 3 weeks. Not working . Fiance lives at home. He is working but they are social distancing at home. He is wearing mask to work. He has not been sick.  We discussed in detail COVID-19 precautions and social distancing with patient education given  Observations/Objective: Oxygen level at home 96% on 3 L.  This was done after walking in her home.  Assessment and Plan: Acute bronchitis with allergic rhinitis vs sinusitis   Pulmonary HTN - appears stable with no reported volume overload or hypoxia  Cont on current regimen .   Plan  Patient Instructions  Augmentin  8768mTwice daily  For 7 days , take with food.  Mucinex DM Twice daily  As needed   Prednisone taper over next week then resume prednisone 2.67m69maily .  Continue on Zyrtec 42m367mily  Add Flonase Nasal 2 puffs daily  Continue on Oxygen 3l/m .  Follow up as planned and As needed   Please contact office for sooner follow up if symptoms do not improve or worsen or seek emergency care      Follow Up Instructions: Follow up in 2 weeks and As needed   Please contact office for sooner follow up if symptoms do not improve or worsen or seek emergency care      I discussed the assessment and treatment plan with the patient. The patient was provided an opportunity to ask questions and all were answered. The patient agreed with the plan and demonstrated an understanding of the instructions.   The patient was advised to call back or seek an in-person evaluation if the symptoms worsen or if the condition fails to improve as anticipated.  I provided 30 minutes of non-face-to-face time during this encounter.   TammRexene Edison

## 2018-06-24 NOTE — Telephone Encounter (Signed)
Call returned to patient, made appt for Tele-Visit. Nothing further needed at this time.

## 2018-06-24 NOTE — Telephone Encounter (Signed)
Offer televisit, TP schedule

## 2018-06-25 ENCOUNTER — Telehealth: Payer: Self-pay | Admitting: Adult Health

## 2018-06-25 NOTE — Telephone Encounter (Signed)
Contacted patient by phone re: note for work.  Patient states she is 'ready to go back to work.'  Had televisit with T. Parrett yesterday 06/24/18 but did not discuss return to work with her.    Patient states she has been out of work 3 weeks due to high risk category for covid19.  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 States she is feeling better today, taking all medications as prescribed from televisit.  Has mild SOB mostly with climbing stairs or more vigorous activity and first thing in the morning.  No fever, no recent travel and no worsening of symptoms.   Patient acknowledged her understanding of the covid precautions and high risk as explained to her in the televisit.    Patient is requesting a simple note to return to work Monday, April 20th and would like note to include sedentary work and be faxed to her home at [phone].  She does not need out of work dates in this note.    LOV - televisit 06/24/18 with T. Parrett Assessment and Plan: Acute bronchitis with allergic rhinitis vs sinusitis   Pulmonary HTN - appears stable with no reported volume overload or hypoxia  Cont on current regimen .   Plan  Patient Instructions  Augmentin 857m Twice daily  For 7 days , take with food.  Mucinex DM Twice daily  As needed   Prednisone taper over next week then resume prednisone 2.515mdaily .  Continue on Zyrtec 1073maily  Add Flonase Nasal 2 puffs daily  Continue on Oxygen 3l/m .  Follow up as planned and As needed   Please contact office for sooner follow up if symptoms do not improve or worsen or seek emergency care    Follow Up Instructions: Follow up in 2 weeks and As needed   Please contact office for sooner follow up if symptoms do not improve or worsen or seek emergency care   Routed to T. Parrett, NP for review.  Tammy, please advise.  Thank you

## 2018-06-26 NOTE — Telephone Encounter (Signed)
Spoke with patient. She is requesting to return back to work on April 20th (next Monday). She wants the note to include that she should be allowed to do sedentary work.   TP, are you ok with [place] writing this letter?

## 2018-06-26 NOTE — Telephone Encounter (Signed)
Letter has been printed. Patient is aware that I will fax it over to her now. Advised her if that she did not receive the letter, to call back and ask for me (as I will hold onto the letter for a few days). She verbalized understanding. Nothing further needed at time of call.

## 2018-06-26 NOTE — Telephone Encounter (Signed)
Called patient to see when exactly patient would like to return to work. Left message for her to call back.

## 2018-06-28 ENCOUNTER — Other Ambulatory Visit (HOSPITAL_COMMUNITY): Payer: Self-pay | Admitting: Cardiology

## 2018-07-01 ENCOUNTER — Encounter (HOSPITAL_COMMUNITY): Payer: Self-pay | Admitting: Cardiology

## 2018-07-07 ENCOUNTER — Ambulatory Visit (HOSPITAL_COMMUNITY)
Admission: RE | Admit: 2018-07-07 | Discharge: 2018-07-07 | Disposition: A | Discharge status: Home / Self Care | Payer: Self-pay | Source: Ambulatory Visit | Attending: Cardiology | Admitting: Cardiology

## 2018-07-07 ENCOUNTER — Other Ambulatory Visit: Payer: Self-pay

## 2018-07-07 ENCOUNTER — Encounter (HOSPITAL_COMMUNITY): Payer: Self-pay | Admitting: *Deleted

## 2018-07-07 DIAGNOSIS — D860 Sarcoidosis of lung: Secondary | ICD-10-CM

## 2018-07-07 DIAGNOSIS — I272 Pulmonary hypertension, unspecified: Secondary | ICD-10-CM

## 2018-07-07 DIAGNOSIS — I2510 Atherosclerotic heart disease of native coronary artery without angina pectoris: Secondary | ICD-10-CM

## 2018-07-07 NOTE — Progress Notes (Signed)
AVS and letter faxed to [phone] per pts request. 40mf/u and echo scheduled. Spoke with pt directly.

## 2018-07-07 NOTE — Patient Instructions (Signed)
Your provider requests you have an echo and follow up in 67month.

## 2018-07-08 NOTE — Progress Notes (Signed)
Heart Failure TeleHealth Note  Due to national recommendations of social distancing due to [place] 19, Audio/video telehealth visit is felt to be most appropriate for this patient at this time.  See MyChart message from today for patient consent regarding telehealth for [facility].  Date:  07/08/2018   ID:  Erin Zavala, DOB December 26, 1965, MRN [id number]  Location: Home  Provider location: [place] [facility] Type of Visit: Established patient  PCP:  Patient, No Pcp Per  Cardiologist: Dr. [name]  Chief Complaint: Shortness of breath.    History of Present Illness: Erin Zavala is a 53 y.o. female who presents via audio/video conferencing for a telehealth visit today.     she denies symptoms worrisome for COVID 19.   Patient has history of sarcoidosis, chronic hypoxemic respiratory failure on home oxygen, and pulmonary hypertension.  She was diagnosed with ocular sarcoidosis in 1997. It seems like she did not have significant lung complications (that were recognized at least) until early 2017.  She now requires home oxygen.  CTA chest shows changes consistent with sarcoidosis, this has been confirmed by biopsy.  Echo in 8/17 had evidence for RV strain and elevated PA pressure.  [place] in 10/17 confirmed pulmonary arterial hypertension with normal right and left heart filling pressures.   She has started on macitentan followed by Adcirca and most recently Tyvaso.  She did not tolerate selexipag well. She feels like these medications have helped her breathing.    She went to [facility] for transplant evaluation in 4/18.  As part of that evaluation, she had right and left heart cath.  Surprisingly, given age and lack of family history, she had an 80% LAD stenosis.  This was treated with DES.  RHC showed mild to moderate pulmonary hypertension. Transplant evaluation is currently on hold as she has been doing better symptomatically.   This winter, she has had several flare of  sarcoidosis/bronchitis, likely viral.  She has had several courses of prednisone. She is currently getting over a flare and is on prednisone 2.5 mg daily.  Breathing has improved.  She is using 1 L oxygen by [place] at rest, 3 L with exertion.  No dyspnea when she wears her oxygen.  No chest pain.  Weight is up a small amount which she attributes to eating more while sheltering in place for coronavirus.  No lightheadedness or syncope.   Labs (9/17): NA negative, ESR 112, anti-dsDNA negative, anti-CCP negative, RF < 14.  ACE 136.  Labs (10/17): K 4.1, creatinine 0.8 Labs (11/17): K 3.8, creatinine 0.91 Labs (3/18): BNP 16 Labs (5/18): K 4.6, creatinine 0.74 Labs (7/18): creatinine 0.79 Labs (9/18): K 4.3, creatinine 0.76 Labs (10/18): LDL 62, HDL 40 Labs (12/19): K 3.6, creatinine 0.73 Labs (1/20): BNP 17.7 Labs (3/20): LDL 67, HDL 65, TGs 185  6 minute walk (10/17): 305 m.  6 minute walk (1/18): 305 m 6 minute walk (3/18): 270 m 6 minute walk (6/18): 302 m 6 minute walk (9/18): 396 m 6 minute walk (2/19): 213 m (could have kept going but was stopped due to decreased oxygen saturation).  6 minute walk (4/19): 396 m 6 minute walk (7/19): 381 m  PMH: 1. GERD 2. Pulmonary hypertension: Suspect Group 5 PH related to sarcoidosis.  - CTA chest (8/17) with no PE but mediastinal/hilar LAN, cystic lung changes, subpleural consolidation, patchy ground glass.  - Echo (8/17): EF 55-60%, D-shaped interventricular septum, PASP 60 mmHg. - ANA negative, ESR 112, anti-dsDNA  negative, anti-CCP negative, RF < 14.  - RHC (10/17): mean RA 3, PA 59/22 mean 36, mean PCWP 5, CI 2.92, PVR 7.4 WU.  - PFTs (10/17): Restrictive.  - RHC (4/18): mean RA 4, PA mean 32, mean PCWP 8, CI 2.9, PVR 5.2 WU.  - Did not tolerate selexipag.  - Echo (5/19): EF 60-65%, mild LVH, mild MR, RV normal size and systolic function, PASP 37 mmHg.  3. Sarcoidosis: Diagnosed with ocular sarcoidosis in 1997. Significant pulmonary  involvement noted in 8/17.  Lung biopsy positive.  - Cardiac MRI (5/18): EF 66%, normal RV size with low normal RV systolic function (EF 24%), no LGE (no evidence for cardiac sarcoidosis).  - CT chest 3/19 with stable pulmonary fibrotic changes related to sarcoidosis.  - PFTs (4/19): FVC 66%, FEV1 69%, ratio 103%, DLCO 37%, TLC 62% => moderate -severe mixed obstruction/restriction.  4. Chronic hypoxemic respiratory failure: Home oxygen 5. Childhood asthma. 6. CAD: LHC (4/18) with 80% LAD stenosis => DES to LAD.   Current Outpatient Medications  Medication Sig Dispense Refill  . ADCIRCA 20 MG tablet Take 2 tablets (40 mg total) by mouth daily. 180 tablet 3  . albuterol (PROVENTIL HFA;VENTOLIN HFA) 108 (90 Base) MCG/ACT inhaler Inhale 2 puffs into the lungs every 4 (four) hours as needed for wheezing or shortness of breath. 1 Inhaler 5  . ambrisentan (LETAIRIS) 10 MG tablet Take 1 tablet (10 mg total) by mouth daily. 30 tablet 11  . aspirin 81 MG tablet Take 1 tablet (81 mg total) by mouth daily. 30 tablet 0  . atorvastatin (LIPITOR) 40 MG tablet TAKE 1 TABLET BY MOUTH EVERY DAY 90 tablet 1  . atorvastatin (LIPITOR) 80 MG tablet Take 80 mg by mouth daily.    . cetirizine (ZYRTEC) 10 MG tablet Take 1 tablet (10 mg total) by mouth daily. 30 tablet 5  . esomeprazole (NEXIUM) 40 MG capsule TAKE 1 CAPSULE BY MOUTH EVERY DAY 30 capsule 3  . ferrous sulfate 325 (65 FE) MG tablet TAKE 1 TABLET (325 MG TOTAL) BY MOUTH 2 (TWO) TIMES DAILY WITH A MEAL. 60 tablet 5  . ipratropium-albuterol (DUONEB) 0.5-2.5 (3) MG/3ML SOLN Take 3 mLs by nebulization every 6 (six) hours as needed. 360 mL 11  . OXYGEN 2-3 lpm 24/7  AHC    . predniSONE (DELTASONE) 10 MG tablet 4 tabs for 2 days, then 3 tabs for 2 days, 2 tabs for 2 days, then 1 tab for 2 days, then resume 2.15m daily 20 tablet 0  . predniSONE (DELTASONE) 2.5 MG tablet Take 1 tablet (2.5 mg total) by mouth daily with breakfast. 30 tablet 5  . SYMBICORT 160-4.5  MCG/ACT inhaler TAKE 2 PUFFS BY MOUTH TWICE A DAY 10.2 Inhaler 1  . Treprostinil (TYVASO) 0.6 MG/ML SOLN Inhale 18 mcg into the lungs 4 (four) times daily.     No current facility-administered medications for this encounter.     Allergies:   Patient has no known allergies.   Social History:  The patient  reports that she quit smoking about 7 years ago. Her smoking use included cigarettes. She has a 2.50 pack-year smoking history. She has never used smokeless tobacco. She reports that she does not drink alcohol or use drugs.   Family History:  The patient's family history includes Hypertension in her father and mother; Sarcoidosis in her maternal aunt.   ROS:  Please see the history of present illness.   All other systems are personally reviewed and negative.  Exam:  (Video/Tele Health Call; Exam is subjective and or/visual.) General:  Speaks in full sentences. No resp difficulty. Neck: No JVD Lungs: Normal respiratory effort with conversation.  Abdomen: Non-distended per patient report Extremities: Pt denies edema. Neuro: Alert & oriented x 3.   Recent Labs: 02/17/2018: BUN 8; Creatinine, Ser 0.73; Hemoglobin 13.0; Platelets 298.0; Potassium 3.6; Sodium 139 04/01/2018: B Natriuretic Peptide 17.7 06/02/2018: ALT 27  Personally reviewed   Wt Readings from Last 3 Encounters:  04/25/18 64.3 kg (141 lb 12.8 oz)  04/11/18 63.7 kg (140 lb 6.4 oz)  04/04/18 63 kg (139 lb)      ASSESSMENT AND PLAN:  1. Sarcoidosis: Ocular and lung involvement, biopsy-proven.  Cardiac MRI in 5/18 did not show evidence for cardiac sarcoidosis.  She is on prednisone and home oxygen. She has been evaluated at [facility] for lung transplant, this is currently on hold as she has clinically improved on PH meds.   - Follows with Dr. [name].  Currently on prednisone for flare of sarcoidosis/bronchitis. 2. Pulmonary hypertension: Pulmonary arterial hypertension. Suspect mixed group 1 and group 5 PH. CTA chest showed no PE  but did show changes of sarcoidosis.  PFTs were restrictive.  Echo showed RV strain and elevated PA pressure.  PAH was confirmed by [place] in 10/17 with PVR 7.4 WU.  Serologic workup was negative.  [place] in 4/18 showed mild to moderate residual pulmonary hypertension. She has felt like generic tadalafil has not been as effective as brand name Adcirca.  She is tolerating Tyvaso (unable to tolerate Malvin Johns).  Echo in 5/19 showed PASP 37 mmHg (improved).    - Continue ambrisentan.   - Continue brand-name Adcirca 40 mg daily.  - Continue Tyvaso.   - Will repeat echo at followup in 2 months to follow RV function.  3. CAD: Found incidentally on cath 4/18 done as part of workup for lung transplantation.  80% LAD stenosis, treated with DES to LAD.   No chest pain.  - Continue ASA 81 mg daily.   - Continue atorvastatin, good lipids in 3/20.   COVID screen The patient does not have any symptoms that suggest any further testing/ screening at this time.  Social distancing reinforced today.  Patient Risk: After full review of this patients clinical status, I feel that they are at moderate risk for cardiac decompensation at this time.  Relevant cardiac medications were reviewed at length with the patient today. The patient does not have concerns regarding their medications at this time.   Recommended follow-up:  2 months with echo  Today, I have spent 15 minutes with the patient with telehealth technology discussing the above issues .    Signed, Loralie Champagne, MD  07/08/2018  [facility] [address] [facility] and [place] [address] [phone] (office) [phone] (fax)

## 2018-07-10 ENCOUNTER — Ambulatory Visit (INDEPENDENT_AMBULATORY_CARE_PROVIDER_SITE_OTHER): Payer: Self-pay | Admitting: Adult Health

## 2018-07-10 ENCOUNTER — Telehealth: Payer: Self-pay | Admitting: Emergency Medicine

## 2018-07-10 ENCOUNTER — Other Ambulatory Visit: Payer: Self-pay

## 2018-07-10 ENCOUNTER — Encounter: Payer: Self-pay | Admitting: Adult Health

## 2018-07-10 DIAGNOSIS — J441 Chronic obstructive pulmonary disease with (acute) exacerbation: Secondary | ICD-10-CM

## 2018-07-10 DIAGNOSIS — D869 Sarcoidosis, unspecified: Secondary | ICD-10-CM

## 2018-07-10 MED ORDER — PREDNISONE 5 MG PO TABS: ORAL_TABLET | ORAL | 0 refills | Status: DC

## 2018-07-10 MED ORDER — BUDESONIDE-FORMOTEROL FUMARATE 160-4.5 MCG/ACT IN AERO: 2.0000 | INHALATION_SPRAY | Freq: Two times a day (BID) | RESPIRATORY_TRACT | 0 refills | Status: DC

## 2018-07-10 MED ORDER — BUDESONIDE-FORMOTEROL FUMARATE 160-4.5 MCG/ACT IN AERO: INHALATION_SPRAY | RESPIRATORY_TRACT | 1 refills | Status: DC

## 2018-07-10 NOTE — Progress Notes (Signed)
Virtual Visit via Telephone Note  I connected with Erin Zavala on 07/10/18 at 12:00 PM EDT by telephone and verified that I am speaking with the correct person using two identifiers.  Location: Patient: Home  Provider: Office    I discussed the limitations, risks, security and privacy concerns of performing an evaluation and management service by telephone and the availability of in person appointments. I also discussed with the patient that there may be a patient responsible charge related to this service. The patient expressed understanding and agreed to proceed.   History of Present Illness: Today tele-visit is for a follow-up for cough and shortness of breath  53 year old female former smoker followed for sarcoidosis with associated ILD. , ocular involvement and COPD . She is on Symbicort Twice daily  And prednisone 2.71m . She has severe pulmonary HTN . She is on ALenore Cordia, and Tyvaso  Patient had a tele-visit 2 weeks ago for flare of cough congestion and wheezing.  At that time her O2 saturations were normal on 3 L of oxygen that she wears on a daily basis.  She was felt to have an acute bronchitis.  She was given Augmentin for 7 days and a prednisone burst for 1 week and then to resume daily prednisone at 2.5 mg as this is her daily chronic dose. Since this visit patient says she is feeling better , cough and congestion are improved . Coughing up clear mucus. Did notice that once she decreased her prednisone to 2.565mdaily that the cough and wheezing has returned slightly. No fever or chest pain. No edema. No orthopnea.  Appetite is good. No n/v/d.  Is back to work. Wears mask. No known sick contacts.   She remains on Symbicort 160 2 puffs twice daily- she uses samples. . Uses Duoneb Twice daily.   . [facility] KitchenHRCT chest 05/2018 showed stable chronic changes with diffuse groundglass opacities, moderate pulmonary fibrosis and traction bronchiectasis.  No significant change since March  2019, notable progression since August 2017.   Observations/Objective: O2 saturation check at home on 3 L of oxygen is 96%   per patient. Says oxygen level have been good.   Assessment and Plan: Slow to resolve bronchitic exacerbation/sarcoid flare Patient will need face-to-face office visit if symptoms are not improving or worsen. We will try to treat with slow taper of prednisone  Plan  Patient Instructions  Increase prednisone to 10 mg daily for 1 week, 5 mg daily for 1 week then resume prednisone 2.5 mg daily. Mucinex DM Twice daily  As needed   Continue on Zyrtec 1048maily  Continue on Symbicort 160 2 puffs twice daily.  We will send a prescription for Symbicort to your pharmacy.  We will also leave samples at our front office please let us [place] when he will come by to pick things up. Use DuoNeb nebulizer as needed Continue on Oxygen 3l/m .  If you are not improving or symptoms worsen will need a office visit for further evaluation Follow up in 1 month as planned and As needed   Please contact office for sooner follow up if symptoms do not improve or worsen or seek emergency care       Follow Up Instructions: Follow-up in 1 month as planned and as needed.  Please contact office for sooner follow up if symptoms do not improve or worsen or seek emergency care    I discussed the assessment and treatment plan with the patient. The patient was  provided an opportunity to ask questions and all were answered. The patient agreed with the plan and demonstrated an understanding of the instructions.   The patient was advised to call back or seek an in-person evaluation if the symptoms worsen or if the condition fails to improve as anticipated.  I provided 24 minutes of non-face-to-face time during this encounter.   Rexene Edison, NP

## 2018-07-10 NOTE — Telephone Encounter (Signed)
  Pt had televisit on 06/24/18 and was told to f/u in 2 weeks. She reports after completing prednisone taper a few days later cough,wheezing and sob resumed. She is on 3 liters 02 with exertion and 1 at rest. Daily 2.5 prednisone She d/c Mucinex she, is still using Flonase daily.  06/24/18 visit with TP: Plan  Patient Instructions  Augmentin 838m Twice daily  For 7 days , take with food.  Mucinex DM Twice daily  As needed   Prednisone taper over next week then resume prednisone 2.520mdaily .  Continue on Zyrtec 108maily  Add Flonase Nasal 2 puffs daily  Continue on Oxygen 3l/m .  Follow up as planned and As needed   Please contact office for sooner follow up if symptoms do not improve or worsen or seek emergency care      Follow Up Instructions: Follow up in 2 weeks and As needed   Please contact office for sooner follow up if symptoms do not improve or worsen or seek emergency care     I discussed the assessment and treatment plan with the patient. The patient was provided an opportunity to ask questions and all were answered. The patient agreed with the plan and demonstrated an understanding of the instructions.  The patient was advised to call back or seek an in-person evaluation if the symptoms worsen or if the condition fails to improve as anticipated.

## 2018-07-10 NOTE — Telephone Encounter (Signed)
Please set up televisit

## 2018-07-10 NOTE — Patient Instructions (Signed)
Increase prednisone to 10 mg daily for 1 week, 5 mg daily for 1 week then resume prednisone 2.5 mg daily. Mucinex DM Twice daily  As needed   Continue on Zyrtec 50m daily  Continue on Symbicort 160 2 puffs twice daily.  We will send a prescription for Symbicort to your pharmacy.  We will also leave samples at our front office please let u[place]oreaknow when he will come by to pick things up. Use DuoNeb nebulizer as needed Continue on Oxygen 3l/m .  If you are not improving or symptoms worsen will need a office visit for further evaluation Follow up in 1 month as planned and As needed   Please contact office for sooner follow up if symptoms do not improve or worsen or seek emergency care

## 2018-07-10 NOTE — Addendum Note (Signed)
Addended by: Parke Poisson E on: 07/10/2018 12:41 PM   Modules accepted: Orders

## 2018-07-10 NOTE — Telephone Encounter (Signed)
Called and spoke with pt stating to her that TP wanted [place] to schedule televisit for her to further evaluate. Pt expressed understanding. televisit has been scheduled for pt with TP today at 12pm. Nothing further needed.

## 2018-07-11 ENCOUNTER — Other Ambulatory Visit: Payer: Self-pay | Admitting: Adult Health

## 2018-07-22 ENCOUNTER — Other Ambulatory Visit: Payer: Self-pay | Admitting: Adult Health

## 2018-07-22 MED ORDER — BUDESONIDE-FORMOTEROL FUMARATE 160-4.5 MCG/ACT IN AERO: 2.0000 | INHALATION_SPRAY | Freq: Two times a day (BID) | RESPIRATORY_TRACT | 1 refills | Status: DC

## 2018-07-22 NOTE — Telephone Encounter (Signed)
Received fax from [place] with note requesting 90 day supply Patient just seen on 4.30.2020 via telephone visit This was already done on 5.4.2020 - apparently pharmacy did not receive Will send again Nothing further needed; will sign off

## 2018-07-31 ENCOUNTER — Encounter (HOSPITAL_COMMUNITY): Payer: Self-pay | Admitting: Cardiology

## 2018-07-31 ENCOUNTER — Telehealth: Payer: Self-pay | Admitting: Emergency Medicine

## 2018-07-31 ENCOUNTER — Telehealth (HOSPITAL_COMMUNITY): Payer: Self-pay | Admitting: Cardiology

## 2018-07-31 NOTE — Telephone Encounter (Signed)
It looks like Tammy has been seeing patient and has a follow up scheduled for June 1st. Please forward to Tammy to advise when she returns.

## 2018-07-31 NOTE — Telephone Encounter (Signed)
Called and spoke with pt who is stating that her job needs a letter written which states what her limitations would be especially with pt being on oxygen. Pt stated that they have talked about putting her on a machine at her job but she has been told by her boss due to her being on oxygen that they would like her to stay sitting at her job. Due to this is why pt is stating a letter needs to be written stating what all her limitations are due to her medical history. Tonya, please advise if you are able to get a letter written for pt or if you can tell us what all we need to state in a letter for pt so she can give to her job. Thanks!

## 2018-07-31 NOTE — Telephone Encounter (Signed)
Spoke with patient and advised her that I will ask TP for her recommendations. Advised her that TP was not in the office today and wouldn't be back until next week. She stated that she was ok with waiting.   TP, please advise. Thanks!

## 2018-07-31 NOTE — Telephone Encounter (Signed)
Patient called to request a updated letter for her employer, reports letter written last month just needs to be updated so she can stay in current department    Letter updated

## 2018-08-05 NOTE — Telephone Encounter (Signed)
That is fine please ask her what the letter needs to say exactly and type up so I can sign   Can this wait till her visit on 6/1 ?

## 2018-08-05 NOTE — Telephone Encounter (Signed)
Spoke with patient. She stated that she just needs a letter that states she is clear to work without any limitations. She spoke with HR and they are allowing her to try out the job for 6 months to see if she is capable of doing the job without any limitations. She is willing to do this. They are aware that she is on O2.   They will reevaluate her at the end of the 6 months.   Letter needs to be faxed to [place].  Letter has been printed. Will given to TP to sign.

## 2018-08-06 IMAGING — DX DG CHEST 2V
2 series · 2 of 2 positions shown · non-contrast
Comparison: 01/16/2016 .  11/01/2015.

CLINICAL DATA: Cough and congestion.

EXAM:
CHEST  2 VIEW

[chest pa]
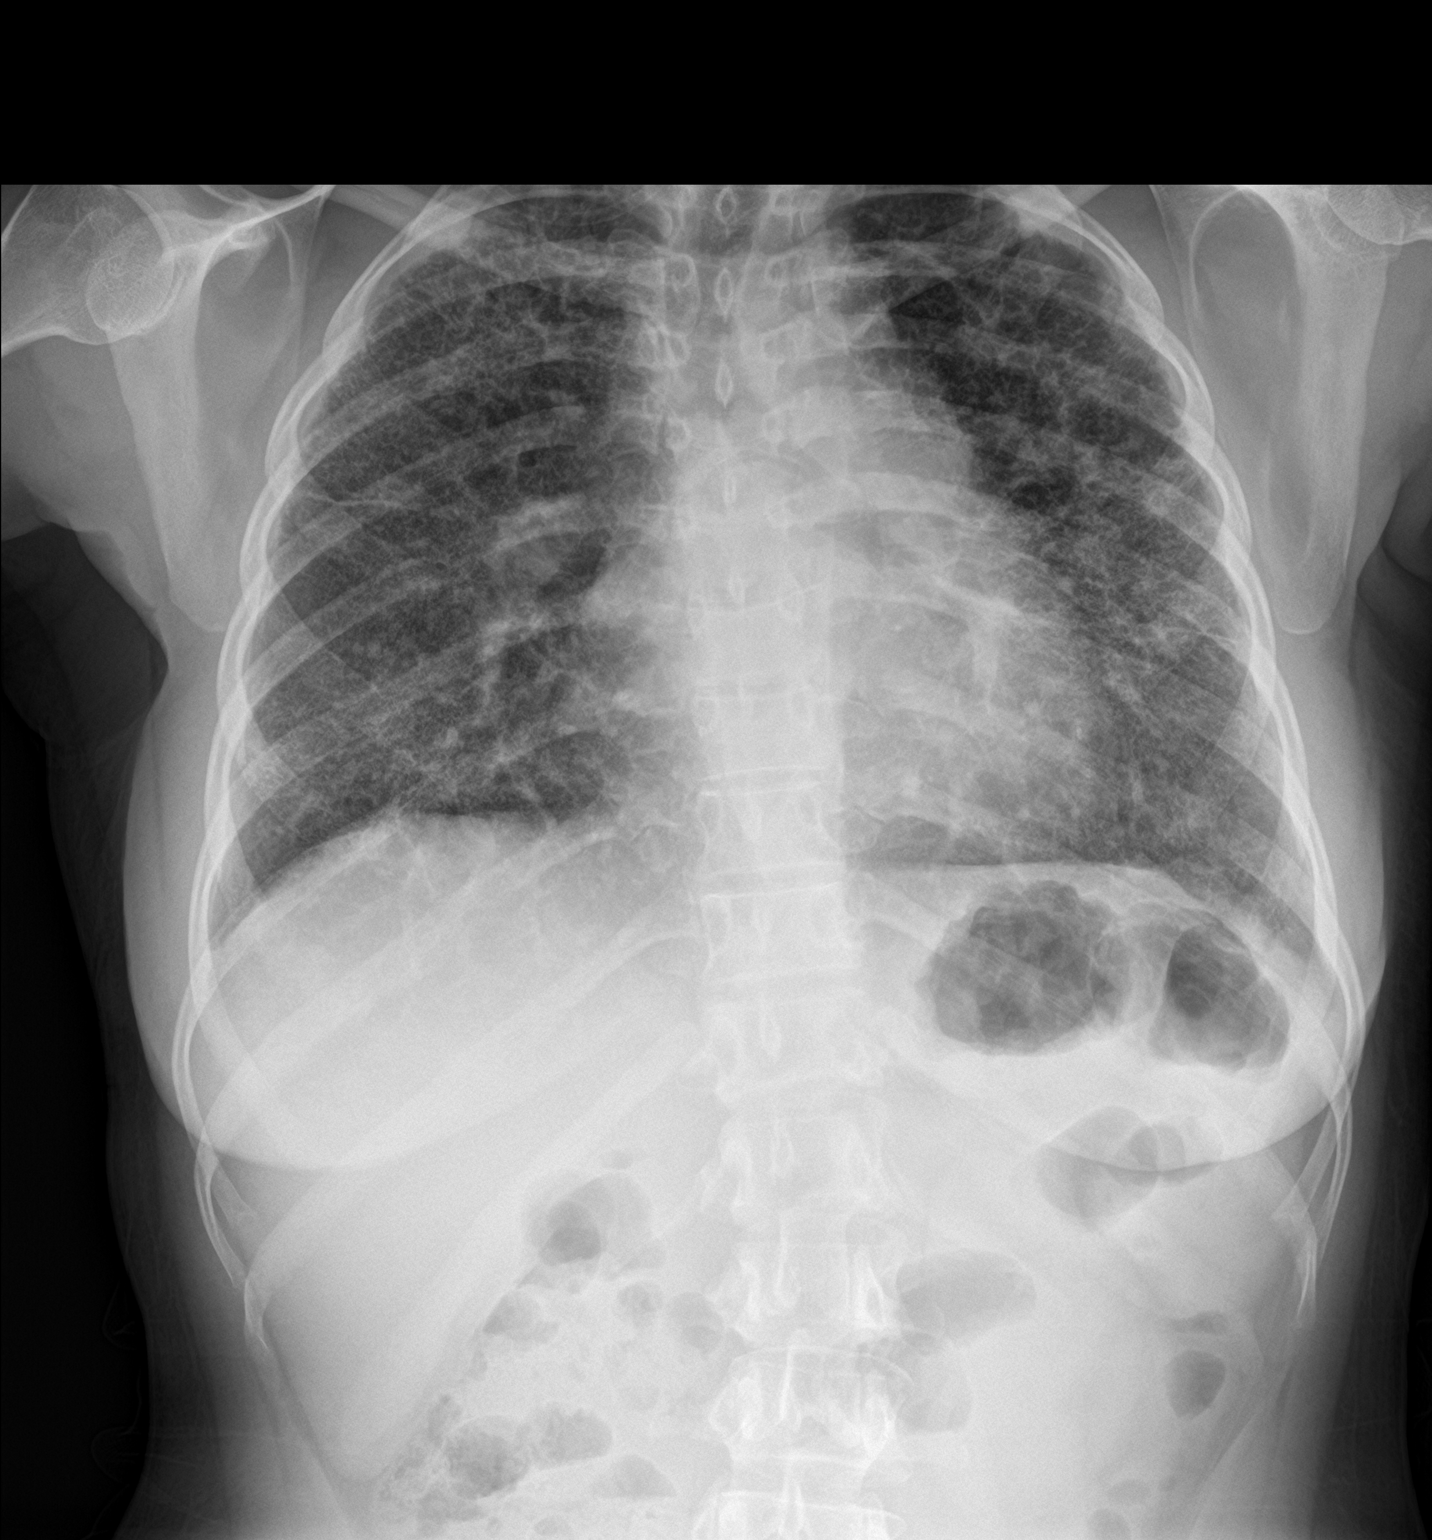

[chest lat]
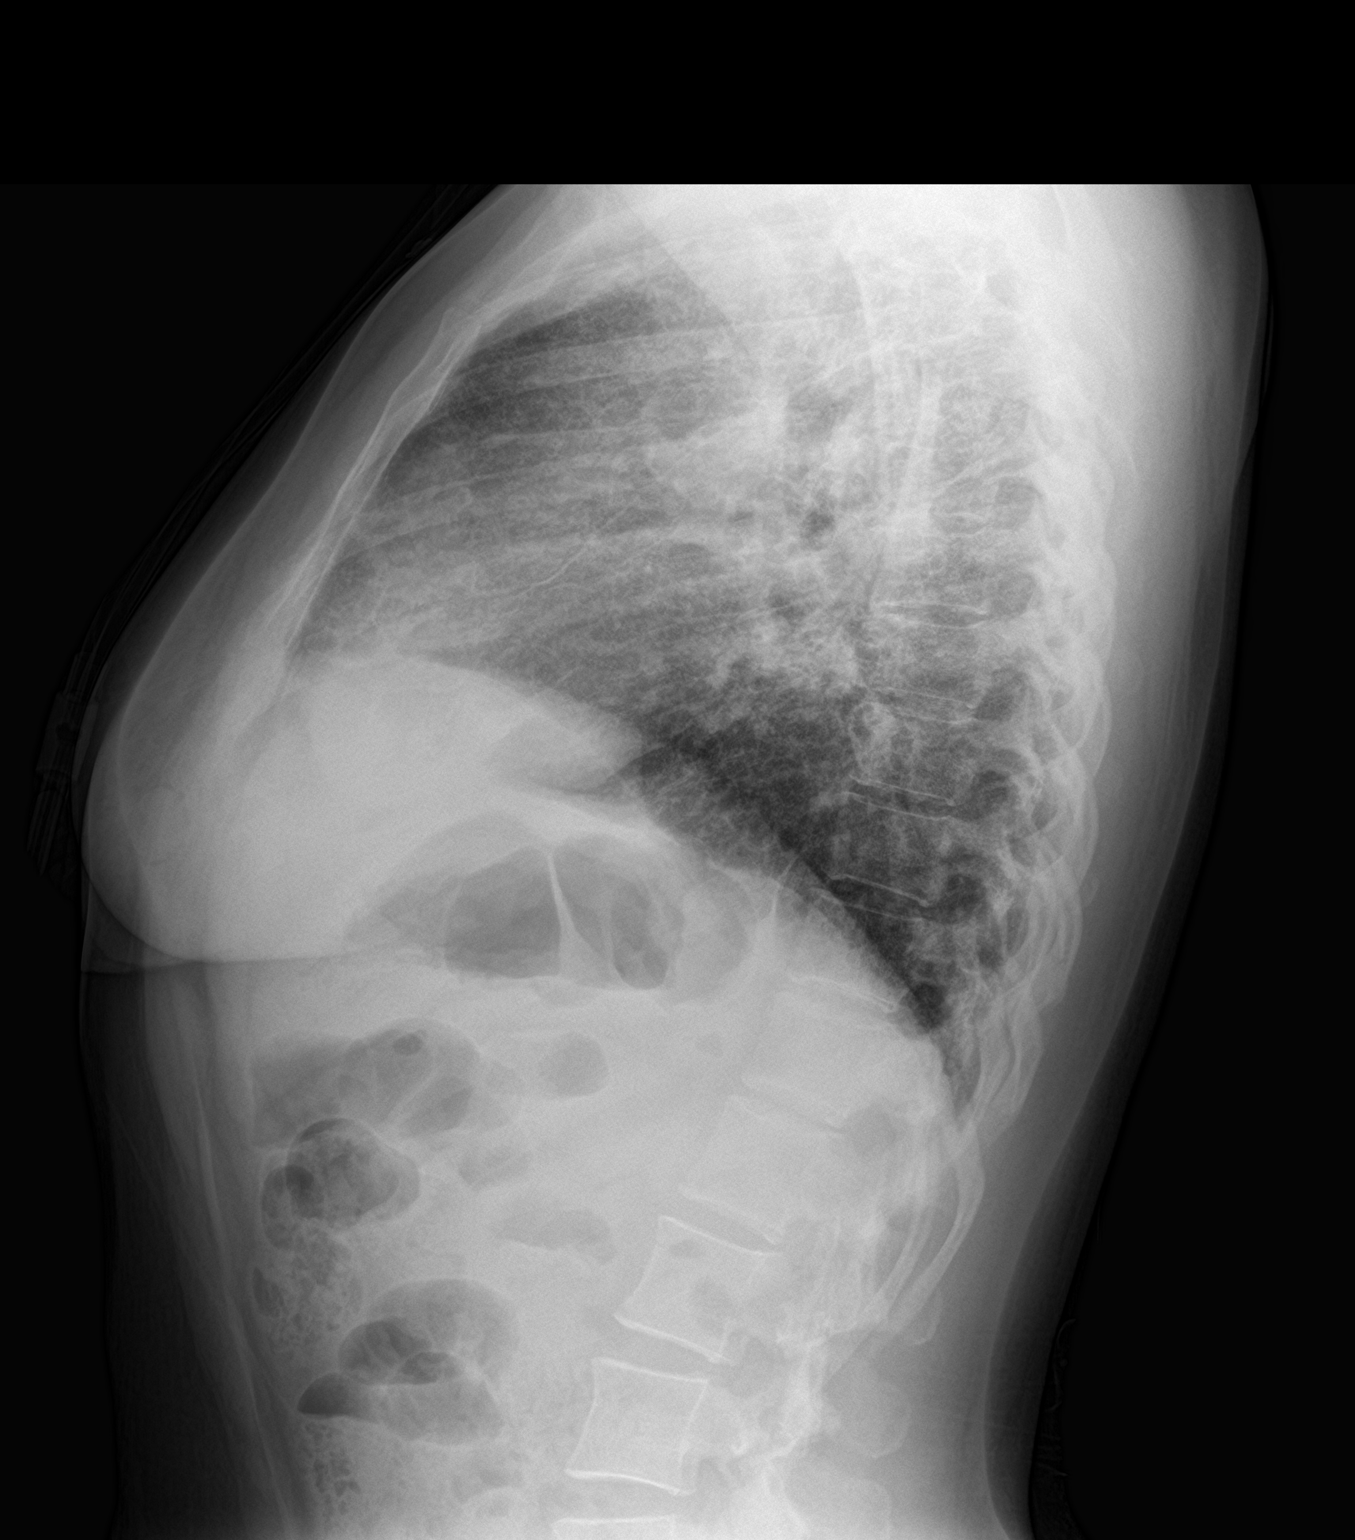

[2 of 2 positions shown; findings below may reference images not displayed]

FINDINGS: Stable cardiomegaly. Unchanged diffuse bilateral interstitial
prominence consistent with chronic interstitial lung disease. No
focal alveolar infiltrate. No pleural effusion or pneumothorax.
Thoracic spine degenerative changes scoliosis .
IMPRESSION: 1. Stable prominent bilateral interstitial prominence consistent
chronic interstitial lung disease.

2. Stable cardiomegaly .

## 2018-08-07 ENCOUNTER — Telehealth: Payer: Self-pay | Admitting: Adult Health

## 2018-08-07 NOTE — Telephone Encounter (Signed)
Called and spoke with patient. Advised her that I was still waiting on TP to sign the letter but I will go ahead and stamp the letter so she is not having to wait. She verbalized understanding. Letter has been faxed.   Nothing further needed at time of call.

## 2018-08-11 ENCOUNTER — Encounter: Payer: Self-pay | Admitting: Adult Health

## 2018-08-11 ENCOUNTER — Other Ambulatory Visit: Payer: Self-pay

## 2018-08-11 ENCOUNTER — Ambulatory Visit (INDEPENDENT_AMBULATORY_CARE_PROVIDER_SITE_OTHER): Payer: Self-pay | Admitting: Adult Health

## 2018-08-11 DIAGNOSIS — D869 Sarcoidosis, unspecified: Secondary | ICD-10-CM

## 2018-08-11 DIAGNOSIS — J9611 Chronic respiratory failure with hypoxia: Secondary | ICD-10-CM

## 2018-08-11 DIAGNOSIS — J849 Interstitial pulmonary disease, unspecified: Secondary | ICD-10-CM

## 2018-08-11 MED ORDER — PREDNISONE 5 MG PO TABS: ORAL_TABLET | ORAL | 3 refills | Status: DC

## 2018-08-11 MED ORDER — BUDESONIDE-FORMOTEROL FUMARATE 160-4.5 MCG/ACT IN AERO: 2.0000 | INHALATION_SPRAY | Freq: Two times a day (BID) | RESPIRATORY_TRACT | 0 refills | Status: DC

## 2018-08-11 NOTE — Patient Instructions (Addendum)
Alternate Prednisone 79m daily and 2.563mdaily.  Mucinex DM Twice daily  As needed   Continue on Zyrtec 1030maily  Saline nasal rinses As needed   Continue on Symbicort 160 2 puffs twice daily.  Will leave 2 Symbicort samples out front at our office.  Fill out paperwork for patient assistance for Symbicort.  Use DuoNeb nebulizer as needed Continue on Oxygen 3l/m .  Follow up in 2 months with Dr. ByrLamonte Sakaind As needed   Please contact office for sooner follow up if symptoms do not improve or worsen or seek emergency care

## 2018-08-11 NOTE — Progress Notes (Signed)
Virtual Visit via Telephone Note  I connected with Erin Zavala on 08/11/18 at  9:00 AM EDT by telephone and verified that I am speaking with the correct person using two identifiers.  Location: Patient: Home  Provider: Office    I discussed the limitations, risks, security and privacy concerns of performing an evaluation and management service by telephone and the availability of in person appointments. I also discussed with the patient that there may be a patient responsible charge related to this service. The patient expressed understanding and agreed to proceed.   History of Present Illness: Today's televisit is a 1 month follow up for Erin Zavala , ILD and Erin Zavala   53 year old female former smoker followed for sarcoidosis with associated ILD. , ocular involvement and Erin Zavala . She is on SymbicortTwice dailyAnd prednisone 2.42m . She has severe pulmonary HTN . She is on Adcirca, Erin Zavala, and Erin Zavala  Pt was recently treated for an acute bronchitis with 7 day course of Erin Zavala and mild prednisone burst . Says she is better with decreased cough . Seen 1 month ago, prednisone was decreased to baseline dose at 2.558mdaily . She feels that when she goes down to 2.42m64mhat her cough increases .  She has no discolored mucus , no fever , chest pain orthopnea. Overall doing okay . No increased dyspnea or change in activity level.  Remains on Symbicort Twice daily. Does not have insurance , depends on samples . We discussed patient assistance program to help with cost.  Remains on 3l/m of oxygen . Says O2 sats are okay .    Observations/Objective: HRCT chest 05/2018 showed stable chronic changes with diffuse groundglass opacities, moderate pulmonary fibrosis and traction bronchiectasis.  No significant change since March 2019, notable progression since August 2017.  Assessment and Plan: Recent bronchitic exacerbation - now improved   Erin Zavala -recent flare with bronchitis . Lingering cough seems to  be steroid responsive    Plan  Patient Instructions  Alternate Prednisone 42mg50mily and 2.42mg 26mly.  Erin Zavala Twice daily  As needed   Continue on Erin Zavala 10mg 63my  Saline nasal rinses As needed   Continue on Symbicort 160 2 puffs twice daily.  Will leave 2 Symbicort samples out front at our office.  Fill out paperwork for patient assistance for Symbicort.  Use DuoNeb nebulizer as needed Continue on Oxygen 3l/m .  Follow up in 2 months with Dr. Byrum Lamonte SakaiAs needed   Please contact office for sooner follow up if symptoms do not improve or worsen or seek emergency care       Follow Up Instructions: Follow up 2 months and As needed     I discussed the assessment and treatment plan with the patient. The patient was provided an opportunity to ask questions and all were answered. The patient agreed with the plan and demonstrated an understanding of the instructions.   The patient was advised to call back or seek an in-person evaluation if the symptoms worsen or if the condition fails to improve as anticipated.  I provided 22 minutes of non-face-to-face time during this encounter.    Erin Zavala

## 2018-08-29 ENCOUNTER — Telehealth: Payer: Self-pay | Admitting: Adult Health

## 2018-08-29 NOTE — Telephone Encounter (Signed)
Attempted to call patient, no answer, left message to call back.   Patient has samples that have been available for pick up since 08/11/2018 and need to see if she plans to pick these up or if we should restock.

## 2018-09-02 ENCOUNTER — Telehealth (HOSPITAL_COMMUNITY): Payer: Self-pay

## 2018-09-02 NOTE — Telephone Encounter (Signed)
fmla paper filled out and faxed. Faxed confirmed. Pt called and would like the original paperwork. Copy to be scanned into the chart. Paper work left at the front office to be picked up by patient.

## 2018-09-04 NOTE — Telephone Encounter (Signed)
Patient has upcoming appointment with Dr. [name] on 09/08/2018.  Samples can be given during OV.  Routing to [place].

## 2018-09-08 ENCOUNTER — Encounter: Payer: Self-pay | Admitting: Emergency Medicine

## 2018-09-08 ENCOUNTER — Ambulatory Visit (INDEPENDENT_AMBULATORY_CARE_PROVIDER_SITE_OTHER): Payer: Self-pay | Admitting: Emergency Medicine

## 2018-09-08 ENCOUNTER — Other Ambulatory Visit: Payer: Self-pay

## 2018-09-08 DIAGNOSIS — J9611 Chronic respiratory failure with hypoxia: Secondary | ICD-10-CM

## 2018-09-08 DIAGNOSIS — D860 Sarcoidosis of lung: Secondary | ICD-10-CM

## 2018-09-08 DIAGNOSIS — R0981 Nasal congestion: Secondary | ICD-10-CM

## 2018-09-08 DIAGNOSIS — I272 Pulmonary hypertension, unspecified: Secondary | ICD-10-CM

## 2018-09-08 DIAGNOSIS — K219 Gastro-esophageal reflux disease without esophagitis: Secondary | ICD-10-CM

## 2018-09-08 NOTE — Patient Instructions (Addendum)
We will change your prednisone to 10 mg daily for the next 2 weeks.  At that time decrease to 5 mg daily.  We will stay at 5 mg daily until your next office visit Please continue your Symbicort 2 puffs twice daily.  Remember to rinse and gargle after using. Continue [name] as you have been taking them.  Get your echocardiogram in July as already planned. Please continue Zyrtec and nasal rinses as you have been doing them. Please start fluticasone nasal spray, 2 sprays each nostril once daily until next visit. Please continue Nexium once daily as you have been taking it. Please do not return to work until September 15, 2018.  Follow with Dr [name] in 6 weeks or sooner if you have any problems

## 2018-09-08 NOTE — Assessment & Plan Note (Signed)
She has multifactorial cough so it is difficult to determine whether her persistent symptoms reflect active sarcoidosis or not.  She is steroid responsive.  Note that she also has persistent groundglass on CT chest on her current dose of maintenance prednisone.  I suspect that she is going to need a higher maintenance dose.  I will increase her to 10 mg daily then taper to 5 and hopefully we can stay at 62m long-term.  We will continue to follow symptoms, imaging.

## 2018-09-08 NOTE — Assessment & Plan Note (Signed)
Continue oxygen 3 L/min

## 2018-09-08 NOTE — Assessment & Plan Note (Signed)
Chronic rhinitis.  Continue Zyrtec, nasal rinses.  Add nasal steroid.  Note that she has increased dust exposure recently through work.

## 2018-09-08 NOTE — Assessment & Plan Note (Signed)
Denies any breakthrough pain.  Continue to treat with Nexium given its possible contribution to her chronic cough

## 2018-09-08 NOTE — Assessment & Plan Note (Signed)
Continue her current regimen, tolerating well without any apparent side effects.  Denies syncope, headache, chest tightness, chest pain.  Erin Zavala, Tyvaso.  She is due for an echocardiogram at the end of July, follows with the advanced heart failure clinic as well.

## 2018-09-08 NOTE — Progress Notes (Signed)
Subjective:    Patient ID: Erin Zavala, female    DOB: 03/11/1966, 53 y.o.   MRN: [id number]  Shortness of Breath   ROV 04/04/18 --Erin Zavala is 53, former smoker, has history of sarcoidosis with associated interstitial lung disease, associated obstructive lung disease, history of ocular involvement.  She has severe secondary pulmonary hypertension and has been managed on oxygen (1 -3 liters per minute), Adcirca, Letairis, Tyvaso.  Bronchodilator regimen includes Symbicort bid, albuterol nebs about once a day.  She was treated for what sounds like an acute bronchitis, flare of obstructive lung disease in early December with a prednisone taper, now back down to 2.5 mg daily, her chronic dose. She has more nasal gtt, cough with clear to yellow, mostly at night. She has some globus sensation, on nexium qd. Not on anti-histamine right now.   She was seen 1/21 in the advanced CHF clinic, continued on Adcirca, ambrisentan, Tyvaso.  6-minute walk was deferred and is planned for next visit. Last CT 05/2017, last TTE 07/2017.   ROV 09/08/2018 --53 year old former smoker with sarcoidosis that has caused associated interstitial lung disease, obstructive lung disease, ocular involvement and secondary pulmonary hypertension.  She is maintained on supplemental oxygen and uses Adcirca, Letairis, Tyvaso.  Her bronchodilator regimen includes Symbicort.  Since I last saw her she had flaring of cough that prompted an increase in her prednisone to 10 mg daily. Seemed to have been proceeded by URI beginning of the year.  Efforts have been made to wean but she has had recurrence of cough on her previous dose of 2.5 mg daily.  Her cough is also influenced by rhinitis, GERD, both treated. Has been exposed top more dust at work lately. Denies any syncope, HA, CP.   Repeat CT scan of the chest on 06/03/2018 reviewed by me, shows persistent calcified mediastinal and hilar lymphadenopathy, groundglass parenchymal changes bilaterally  with associated fibrotic change, traction bronchiectasis and bronchiolectasis.  No significant change compared with 06/07/2017.    Review of Systems  Respiratory: Positive for shortness of breath.     Past Medical History:  Diagnosis Date  . Asthma    as a child  . Cough   . Dyspnea   . GERD (gastroesophageal reflux disease)   . Migraines   . Oxygen dependent   . Pulmonary hypertension ([place])   . Sarcoidosis      Family History  Problem Relation Age of Onset  . Hypertension Mother   . Hypertension Father   . Sarcoidosis Maternal Aunt   . Rheumatologic disease Neg Hx      Social History   Socioeconomic History  . Marital status: Married    Spouse name: Not on file  . Number of children: Not on file  . Years of education: Not on file  . Highest education level: Not on file  Occupational History  . Not on file  Social Needs  . Financial resource strain: Not on file  . Food insecurity    Worry: Not on file    Inability: Not on file  . Transportation needs    Medical: Not on file    Non-medical: Not on file  Tobacco Use  . Smoking status: Former Smoker    Packs/day: 0.25    Years: 10.00    Pack years: 2.50    Types: Cigarettes    Quit date: 01/11/2011    Years since quitting: 7.6  . Smokeless tobacco: Never Used  Substance and Sexual Activity  . Alcohol  use: No  . Drug use: No  . Sexual activity: Not on file  Lifestyle  . Physical activity    Days per week: Not on file    Minutes per session: Not on file  . Stress: Not on file  Relationships  . Social Herbalist on phone: Not on file    Gets together: Not on file    Attends religious service: Not on file    Active member of club or organization: Not on file    Attends meetings of clubs or organizations: Not on file    Relationship status: Not on file  . Intimate partner violence    Fear of current or ex partner: Not on file    Emotionally abused: Not on file    Physically abused: Not on  file    Forced sexual activity: Not on file  Other Topics Concern  . Not on file  Social History Narrative   Patient denies any bird or mold exposure. Currently works with exposure to inhaled perfumes and chemicals. 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 Previous exposure to inhaled dust while working in [employer]. Denies any IV drug use or previous incarceration. No known tuberculosis exposure. She has never lived nor volunteered at a homeless shelter.     No Known Allergies   Outpatient Medications Prior to Visit  Medication Sig Dispense Refill  . ADCIRCA 20 MG tablet Take 2 tablets (40 mg total) by mouth daily. 180 tablet 3  . albuterol (PROVENTIL HFA;VENTOLIN HFA) 108 (90 Base) MCG/ACT inhaler Inhale 2 puffs into the lungs every 4 (four) hours as needed for wheezing or shortness of breath. 1 Inhaler 5  . ambrisentan (LETAIRIS) 10 MG tablet Take 1 tablet (10 mg total) by mouth daily. 30 tablet 11  . aspirin 81 MG tablet Take 1 tablet (81 mg total) by mouth daily. 30 tablet 0  . atorvastatin (LIPITOR) 40 MG tablet TAKE 1 TABLET BY MOUTH EVERY DAY 90 tablet 1  . budesonide-formoterol (SYMBICORT) 160-4.5 MCG/ACT inhaler Inhale 2 puffs into the lungs 2 (two) times daily. 3 Inhaler 1  . cetirizine (ZYRTEC) 10 MG tablet Take 1 tablet (10 mg total) by mouth daily. 30 tablet 5  . esomeprazole (NEXIUM) 40 MG capsule TAKE 1 CAPSULE BY MOUTH EVERY DAY 30 capsule 3  . ferrous sulfate 325 (65 FE) MG tablet TAKE 1 TABLET (325 MG TOTAL) BY MOUTH 2 (TWO) TIMES DAILY WITH A MEAL. 60 tablet 5  . ipratropium-albuterol (DUONEB) 0.5-2.5 (3) MG/3ML SOLN Take 3 mLs by nebulization every 6 (six) hours as needed. 360 mL 11  . OXYGEN 2-3 lpm 24/7  [facility]    . predniSONE (DELTASONE) 2.5 MG tablet Take 1 tablet (2.5 mg total) by mouth daily with breakfast. 30 tablet 5  . predniSONE (DELTASONE) 5 MG tablet Alternate 102m and 2.515mdaily . 15 tablet 3   . Treprostinil (TYVASO) 0.6 MG/ML SOLN Inhale 18 mcg into the lungs 4 (four) times daily.    . budesonide-formoterol (SYMBICORT) 160-4.5 MCG/ACT inhaler Inhale 2 puffs into the lungs 2 (two) times daily. 2 Inhaler 0   No facility-administered medications prior to visit.         Objective:   Physical Exam Vitals:   09/08/18 0939  BP: 138/68  Pulse: (!) 101  SpO2: 96%  Weight: 154 lb (69.9 kg)  Height: 5' (1.524 m)   Gen: Pleasant, well-nourished, in no distress  ENT:  No lesions,  mouth clear,  oropharynx clear, some nasal congestion  Neck: No JVD, no UA noise  Lungs: No use of accessory muscles, distant, soft insp and exp squeaks  Cardiovascular: RRR, no M, +S4  Musculoskeletal: No deformities, no cyanosis or clubbing  Neuro: alert, non focal  Skin: Warm, no lesions or rash     Assessment & Plan:  Pulmonary sarcoidosis ([place]) She has multifactorial cough so it is difficult to determine whether her persistent symptoms reflect active sarcoidosis or not.  She is steroid responsive.  Note that she also has persistent groundglass on CT chest on her current dose of maintenance prednisone.  I suspect that she is going to need a higher maintenance dose.  I will increase her to 10 mg daily then taper to 5 and hopefully we can stay at 30m long-term.  We will continue to follow symptoms, imaging.  [place]ulmonary hypertension ([place] Continue her current regimen, tolerating well without any apparent side effects.  Denies syncope, headache, chest tightness, chest pain.  ARalene Cork Tyvaso.  She is due for an echocardiogram at the end of July, follows with the advanced heart failure clinic as well.  GERD (gastroesophageal reflux disease) Denies any breakthrough pain.  Continue to treat with Nexium given its possible contribution to her chronic cough  Sinus congestion Chronic rhinitis.  Continue Zyrtec, nasal rinses.  Add nasal steroid.  Note that she has increased dust exposure  recently through work.  Chronic respiratory failure with hypoxia ([place] Continue oxygen 3 L/min  RBaltazar Apo MD, PhD 09/08/2018, 10:09 AM [facility] [phone] if no answer [phone]

## 2018-09-08 NOTE — Telephone Encounter (Signed)
Samples have been given to the pt. Nothing further was needed.

## 2018-09-10 ENCOUNTER — Other Ambulatory Visit (HOSPITAL_COMMUNITY): Payer: Self-pay

## 2018-09-10 DIAGNOSIS — I2510 Atherosclerotic heart disease of native coronary artery without angina pectoris: Secondary | ICD-10-CM

## 2018-09-11 ENCOUNTER — Ambulatory Visit: Payer: Self-pay | Admitting: Emergency Medicine

## 2018-09-18 ENCOUNTER — Encounter: Payer: Self-pay | Admitting: Adult Health

## 2018-09-18 ENCOUNTER — Other Ambulatory Visit: Payer: Self-pay

## 2018-09-18 ENCOUNTER — Ambulatory Visit (INDEPENDENT_AMBULATORY_CARE_PROVIDER_SITE_OTHER): Payer: Self-pay | Admitting: Adult Health

## 2018-09-18 DIAGNOSIS — D860 Sarcoidosis of lung: Secondary | ICD-10-CM

## 2018-09-18 DIAGNOSIS — J449 Chronic obstructive pulmonary disease, unspecified: Secondary | ICD-10-CM

## 2018-09-18 DIAGNOSIS — I272 Pulmonary hypertension, unspecified: Secondary | ICD-10-CM

## 2018-09-18 DIAGNOSIS — J9611 Chronic respiratory failure with hypoxia: Secondary | ICD-10-CM

## 2018-09-18 MED ORDER — BUDESONIDE-FORMOTEROL FUMARATE 160-4.5 MCG/ACT IN AERO: 2.0000 | INHALATION_SPRAY | Freq: Two times a day (BID) | RESPIRATORY_TRACT | 0 refills | Status: DC

## 2018-09-18 NOTE — Assessment & Plan Note (Signed)
Increased symptom burden - suspect this is multifactoral with multiple comorbidities including sarcoid, ILD, chronic hypoxic respiratory failure oxygen dependent, weight gain, deconditioning, chronic steroid use. Patient does continue to work full-time and has been able to continue with this however will need a more sedentary light duty work assignment to continue to be able to work full-time using her oxygen.  Have encouraged her on activity as tolerated.  Will discuss on return possible pulmonary rehab if she could work this out with her current work schedule  Plan  Patient Instructions  Continue on Prednisone 11m daily 2 weeks then 529mdaily .  Mucinex DM Twice daily  As needed   Continue on Zyrtec 1012maily  Saline nasal rinses As needed   Continue on Symbicort 160 2 puffs twice daily.  Use DuoNeb nebulizer as needed Continue on Oxygen 3l/m .  May return to work 09/19/18 with seated/sendatary /light duty restrictions.  Follow up in 4-6 weeks with Dr. ByrLamonte Sakaind As needed   Please contact office for sooner follow up if symptoms do not improve or worsen or seek emergency care

## 2018-09-18 NOTE — Progress Notes (Signed)
_0  ID: Erin Zavala, female    DOB: 01/19/1966, 53 y.o.   MRN: [id number]  Chief Complaint  Patient presents with  . Follow-up    Sarcoid     Referring provider: No ref. provider found  HPI: 53 year old female former smoker followed for sarcoidosis with associated ILD. , ocular involvement and COPD . She is on SymbicortTwice dailyAnd prednisone 2.54m . She has severe pulmonary HTN . She is on ALenore Cordia, and Tyvaso  TEST/EVENTS :  High-resolution CT chest March 2019 showed mediastinal hilar and pulmonary parenchymal fibrotic changes of sarcoid  2D echo May 2019 showed EF 60 to 65%, pulmonary artery pressure 37 mmHg (significant improvement from previous pulmonary pressure at 60 mmHg)  PFT April 2019 showed FEV1 74%, ratio 87, FVC 68%, no significant bronchodilator response.  Positive mid flow reversibility.  DLCO 34%.  [facility] evaluation for transplant eval , right and left heart cath.  Showed 80% LAD stenosis.  She was treated with DES.  Right heart cath showed mild to moderate pulmonary hypertension.  HRCT chest 3/2020showed stable chronic changes with diffuse groundglass opacities, moderate pulmonary fibrosis and traction bronchiectasis. No significant change since March 2019, notable progression since August 2017.  09/18/2018 Follow up : Sarcoid w/ Associated ILD , COPD , O2 RF  Patient returns for a follow-up visit.  Patient was seen approximately 2 weeks ago at that time was having increased difficulty with her breathing.  Was felt to be possible sarcoid related this seems to be steroid responsive.  She was recommended to continue on prednisone 10 mg daily for 2 weeks and then decrease to 5 mg daily which would be her new baseline dose.  Patient's been on chronic steroids for her underlying sarcoid.  Patient says that she was doing okay until she returned back to work.  She has a new work duty where she is having do more physical labor and is exposed to very dusty  bags of mail at her work.  Prior to her new work assignment patient was completing her work sitting down while wearing her oxygen and did not have exposure to excessive dust.  She says she did much better with this.  Is having a lot of trouble with her new work assignment.  She wants to continue to work full-time and has been able to do this with her oxygen and wants to stay as active as she can but feels that this new assignment is causing her more difficulty. She denies any discolored mucus, fever, edema. Says she gets out of breath with heavy activity.  When exposed to dust seems to have more cough and intermittent wheezing. We discussed her returning back to work with a light duty sedentary sitting position to help her complete her full-time work assignments    No Known Allergies  Immunization History  Administered Date(s) Administered  . Influenza,inj,Quad PF,6+ Mos 02/28/2016, 12/18/2016, 01/02/2018  . PPD Test 02/28/2016  . Pneumococcal Conjugate-13 11/29/2016  . Pneumococcal Polysaccharide-23 11/03/2015    Past Medical History:  Diagnosis Date  . Asthma    as a child  . Cough   . Dyspnea   . GERD (gastroesophageal reflux disease)   . Migraines   . Oxygen dependent   . Pulmonary hypertension ([place]   . Sarcoidosis     Tobacco History: Social History   Tobacco Use  Smoking Status Former Smoker  . Packs/day: 0.25  . Years: 10.00  . Pack years: 2.50  . Types: Cigarettes  .  Quit date: 01/11/2011  . Years since quitting: 7.6  Smokeless Tobacco Never Used   Counseling given: Not Answered   Outpatient Medications Prior to Visit  Medication Sig Dispense Refill  . ADCIRCA 20 MG tablet Take 2 tablets (40 mg total) by mouth daily. 180 tablet 3  . albuterol (PROVENTIL HFA;VENTOLIN HFA) 108 (90 Base) MCG/ACT inhaler Inhale 2 puffs into the lungs every 4 (four) hours as needed for wheezing or shortness of breath. 1 Inhaler 5  . ambrisentan (LETAIRIS) 10 MG tablet Take 1  tablet (10 mg total) by mouth daily. 30 tablet 11  . aspirin 81 MG tablet Take 1 tablet (81 mg total) by mouth daily. 30 tablet 0  . atorvastatin (LIPITOR) 40 MG tablet TAKE 1 TABLET BY MOUTH EVERY DAY 90 tablet 1  . cetirizine (ZYRTEC) 10 MG tablet Take 1 tablet (10 mg total) by mouth daily. 30 tablet 5  . esomeprazole (NEXIUM) 40 MG capsule TAKE 1 CAPSULE BY MOUTH EVERY DAY 30 capsule 3  . ferrous sulfate 325 (65 FE) MG tablet TAKE 1 TABLET (325 MG TOTAL) BY MOUTH 2 (TWO) TIMES DAILY WITH A MEAL. 60 tablet 5  . ipratropium-albuterol (DUONEB) 0.5-2.5 (3) MG/3ML SOLN Take 3 mLs by nebulization every 6 (six) hours as needed. 360 mL 11  . OXYGEN 2-3 lpm 24/7  [facility]    . predniSONE (DELTASONE) 2.5 MG tablet Take 1 tablet (2.5 mg total) by mouth daily with breakfast. 30 tablet 5  . predniSONE (DELTASONE) 5 MG tablet Alternate 18m and 2.574mdaily . 15 tablet 3  . Treprostinil (TYVASO) 0.6 MG/ML SOLN Inhale 18 mcg into the lungs 4 (four) times daily.    . budesonide-formoterol (SYMBICORT) 160-4.5 MCG/ACT inhaler Inhale 2 puffs into the lungs 2 (two) times daily. 3 Inhaler 1   No facility-administered medications prior to visit.      Review of Systems:   Constitutional:   No  weight loss, night sweats,  Fevers, chills,  +fatigue, or  lassitude.  HEENT:   No headaches,  Difficulty swallowing,  Tooth/dental problems, or  Sore throat,                No sneezing, itching, ear ache, nasal congestion, post nasal drip,   CV:  No chest pain,  Orthopnea, PND, swelling in lower extremities, anasarca, dizziness, palpitations, syncope.   GI  No heartburn, indigestion, abdominal pain, nausea, vomiting, diarrhea, change in bowel habits, loss of appetite, bloody stools.   Resp:  .  No chest wall deformity  Skin: no rash or lesions.  GU: no dysuria, change in color of urine, no urgency or frequency.  No flank pain, no hematuria   MS:  No joint pain or swelling.  No decreased range of motion.  No back  pain.    Physical Exam  BP 130/90 (BP Location: Left Arm, Cuff Size: Normal)   Pulse 97   Temp 98.4 F (36.9 C) (Oral)   Ht _0  (1.549 m)   Wt 154 lb (69.9 kg)   SpO2 100%   BMI 29.10 kg/m   GEN: A/Ox3; pleasant , NAD, chronically ill appearing on O2    HEENT:  [place]/AT,    NOSE-clear, THROAT-clear, no lesions, no postnasal drip or exudate noted.   NECK:  Supple w/ fair ROM; no JVD; normal carotid impulses w/o bruits; no thyromegaly or nodules palpated; no lymphadenopathy.    RESP  Clear  P & A; w/o, wheezes/ rales/ or rhonchi. no accessory muscle use,  no dullness to percussion  CARD:  RRR, no m/r/g, tr peripheral edema, pulses intact, no cyanosis or clubbing.  GI:   Soft & nt; nml bowel sounds; no organomegaly or masses detected.   Musco: Warm bil, no deformities or joint swelling noted.   Neuro: alert, no focal deficits noted.    Skin: Warm, no lesions or rashes    Lab Results:  CBC    Component Value Date/Time   WBC 8.5 02/17/2018 0950   RBC 4.50 02/17/2018 0950   HGB 13.0 02/17/2018 0950   HCT 40.7 02/17/2018 0950   PLT 298.0 02/17/2018 0950   MCV 90.5 02/17/2018 0950   MCH 29.0 01/13/2016 1245   MCHC 31.8 02/17/2018 0950   RDW 15.0 02/17/2018 0950   LYMPHSABS 1.1 09/26/2016 1707   MONOABS 0.7 09/26/2016 1707   EOSABS 0.3 09/26/2016 1707   BASOSABS 0.1 09/26/2016 1707    BMET    Component Value Date/Time   NA 139 02/17/2018 0950   K 3.6 02/17/2018 0950   CL 104 02/17/2018 0950   CO2 25 02/17/2018 0950   GLUCOSE 98 02/17/2018 0950   BUN 8 02/17/2018 0950   CREATININE 0.73 02/17/2018 0950   CALCIUM 8.8 02/17/2018 0950   GFRNONAA >60 05/18/2016 1109   GFRAA >60 05/18/2016 1109    BNP    Component Value Date/Time   BNP 17.7 04/01/2018 1020    ProBNP No results found for: PROBNP  Imaging: No results found.    PFT Results Latest Ref Rng & Units 06/26/2017 11/28/2016 08/23/2016 12/27/2015  FVC-Pre L 1.61 1.69 1.70 1.53  FVC-Predicted Pre  % 66 68 69 61  FVC-Post L 1.67 - - 1.56  FVC-Predicted Post % 68 - - 63  Pre FEV1/FVC % % 84 87 87 79  Post FEV1/FCV % % 87 - - 74  FEV1-Pre L 1.35 1.46 1.49 1.21  FEV1-Predicted Pre % 69 74 75 60  FEV1-Post L 1.46 - - 1.16  DLCO UNC% % 34 _0 DLCO COR %Predicted % 59 51 54 47  TLC L 2.80 - - 2.62  TLC % Predicted % 62 - - 58  RV % Predicted % 78 - - 70    No results found for: NITRICOXIDE      Assessment & Plan:   Pulmonary sarcoidosis ([facility]) Increased symptom burden - suspect this is multifactoral with multiple comorbidities including sarcoid, ILD, chronic hypoxic respiratory failure oxygen dependent, weight gain, deconditioning, chronic steroid use. Patient does continue to work full-time and has been able to continue with this however will need a more sedentary light duty work assignment to continue to be able to work full-time using her oxygen.  Have encouraged her on activity as tolerated.  Will discuss on return possible pulmonary rehab if she could work this out with her current work schedule  Plan  Patient Instructions  Continue on Prednisone 62m daily 2 weeks then 543mdaily .  Mucinex DM Twice daily  As needed   Continue on Zyrtec 1061maily  Saline nasal rinses As needed   Continue on Symbicort 160 2 puffs twice daily.  Use DuoNeb nebulizer as needed Continue on Oxygen 3l/m .  May return to work 09/19/18 with seated/sendatary /light duty restrictions.  Follow up in 4-6 weeks with Dr. ByrLamonte Sakaind As needed   Please contact office for sooner follow up if symptoms do not improve or worsen or seek emergency care       Pulmonary hypertension (  [place]) Continue on current regimen.  Follow-up with the CHF clinic next month with a follow-up echo as planned  Chronic respiratory failure with hypoxia ([place]) Continue on oxygen  COPD (chronic obstructive pulmonary disease) ([place]) Will cont on Symbicort  duoneb .As needed   Increased cough with dust exposure . - limit  exposure   Plan  Patient Instructions  Continue on Prednisone 35m daily 2 weeks then 568mdaily .  Mucinex DM Twice daily  As needed   Continue on Zyrtec 101maily  Saline nasal rinses As needed   Continue on Symbicort 160 2 puffs twice daily.  Use DuoNeb nebulizer as needed Continue on Oxygen 3l/m .  May return to work 09/19/18 with seated/sendatary /light duty restrictions.  Follow up in 4-6 weeks with Dr. ByrLamonte Sakaind As needed   Please contact office for sooner follow up if symptoms do not improve or worsen or seek emergency care          TamRexene EdisonP 09/18/2018

## 2018-09-18 NOTE — Assessment & Plan Note (Addendum)
Will cont on Symbicort  duoneb .As needed   Increased cough with dust exposure . - limit exposure   Plan  Patient Instructions  Continue on Prednisone 11m daily 2 weeks then 535mdaily .  Mucinex DM Twice daily  As needed   Continue on Zyrtec 1030maily  Saline nasal rinses As needed   Continue on Symbicort 160 2 puffs twice daily.  Use DuoNeb nebulizer as needed Continue on Oxygen 3l/m .  May return to work 09/19/18 with seated/sendatary /light duty restrictions.  Follow up in 4-6 weeks with Dr. ByrLamonte Sakaind As needed   Please contact office for sooner follow up if symptoms do not improve or worsen or seek emergency care

## 2018-09-18 NOTE — Assessment & Plan Note (Signed)
Continue on oxygen

## 2018-09-18 NOTE — Patient Instructions (Addendum)
Continue on Prednisone 78m daily 2 weeks then 580mdaily .  Mucinex DM Twice daily  As needed   Continue on Zyrtec 1026maily  Saline nasal rinses As needed   Continue on Symbicort 160 2 puffs twice daily.  Use DuoNeb nebulizer as needed Continue on Oxygen 3l/m .  May return to work 09/19/18 with seated/sendatary /light duty restrictions.  Follow up in 4-6 weeks with Dr. ByrLamonte Sakaind As needed   Please contact office for sooner follow up if symptoms do not improve or worsen or seek emergency care

## 2018-09-18 NOTE — Assessment & Plan Note (Signed)
Continue on current regimen.  Follow-up with the CHF clinic next month with a follow-up echo as planned

## 2018-09-19 ENCOUNTER — Encounter: Payer: Self-pay | Admitting: *Deleted

## 2018-09-19 ENCOUNTER — Telehealth: Payer: Self-pay | Admitting: Adult Health

## 2018-09-19 MED ORDER — PREDNISONE 5 MG PO TABS: ORAL_TABLET | ORAL | 1 refills | Status: DC

## 2018-09-19 NOTE — Telephone Encounter (Signed)
Yes that is fine  Thanks

## 2018-09-19 NOTE — Telephone Encounter (Signed)
Called and spoke with patient. She needs a refill on her prednisone and a note to state she is out of work until the 13th she didn't realize the 10th was today when she talked with you yesterday.  Tammy Please advise

## 2018-09-19 NOTE — Telephone Encounter (Signed)
Called and spoke with Patient. TP recommendations given.  Understanding stated.  Prednisone refill sent to requested pharmacy. Letter signed by TP placed in sealed envelope at front desk for pick up.  Nothing further at this time.

## 2018-10-06 ENCOUNTER — Encounter (HOSPITAL_COMMUNITY): Payer: Self-pay | Admitting: Cardiology

## 2018-10-06 ENCOUNTER — Other Ambulatory Visit (HOSPITAL_COMMUNITY): Payer: Self-pay

## 2018-10-20 ENCOUNTER — Encounter (HOSPITAL_COMMUNITY): Payer: Self-pay | Admitting: Cardiology

## 2018-10-20 ENCOUNTER — Ambulatory Visit (HOSPITAL_COMMUNITY)
Admission: RE | Admit: 2018-10-20 | Discharge: 2018-10-20 | Disposition: A | Discharge status: Home / Self Care | Payer: Self-pay | Source: Ambulatory Visit | Attending: Cardiology | Admitting: Cardiology

## 2018-10-20 ENCOUNTER — Ambulatory Visit (HOSPITAL_BASED_OUTPATIENT_CLINIC_OR_DEPARTMENT_OTHER)
Admission: RE | Admit: 2018-10-20 | Discharge: 2018-10-20 | Disposition: A | Discharge status: Home / Self Care | Payer: Self-pay | Source: Ambulatory Visit | Attending: Cardiology | Admitting: Cardiology

## 2018-10-20 ENCOUNTER — Other Ambulatory Visit: Payer: Self-pay

## 2018-10-20 VITALS — BP 110/82 | HR 74 | Wt 154.8 lb

## 2018-10-20 DIAGNOSIS — I2721 Secondary pulmonary arterial hypertension: Secondary | ICD-10-CM | POA: Insufficient documentation

## 2018-10-20 DIAGNOSIS — I272 Pulmonary hypertension, unspecified: Secondary | ICD-10-CM

## 2018-10-20 DIAGNOSIS — J9611 Chronic respiratory failure with hypoxia: Secondary | ICD-10-CM | POA: Insufficient documentation

## 2018-10-20 DIAGNOSIS — Z7951 Long term (current) use of inhaled steroids: Secondary | ICD-10-CM | POA: Insufficient documentation

## 2018-10-20 DIAGNOSIS — Z7982 Long term (current) use of aspirin: Secondary | ICD-10-CM | POA: Insufficient documentation

## 2018-10-20 DIAGNOSIS — Z79899 Other long term (current) drug therapy: Secondary | ICD-10-CM | POA: Insufficient documentation

## 2018-10-20 DIAGNOSIS — D860 Sarcoidosis of lung: Secondary | ICD-10-CM

## 2018-10-20 DIAGNOSIS — I2510 Atherosclerotic heart disease of native coronary artery without angina pectoris: Secondary | ICD-10-CM

## 2018-10-20 DIAGNOSIS — Z87891 Personal history of nicotine dependence: Secondary | ICD-10-CM | POA: Insufficient documentation

## 2018-10-20 DIAGNOSIS — K219 Gastro-esophageal reflux disease without esophagitis: Secondary | ICD-10-CM | POA: Insufficient documentation

## 2018-10-20 DIAGNOSIS — Z955 Presence of coronary angioplasty implant and graft: Secondary | ICD-10-CM | POA: Insufficient documentation

## 2018-10-20 DIAGNOSIS — Z7952 Long term (current) use of systemic steroids: Secondary | ICD-10-CM | POA: Insufficient documentation

## 2018-10-20 DIAGNOSIS — Z9981 Dependence on supplemental oxygen: Secondary | ICD-10-CM | POA: Insufficient documentation

## 2018-10-20 DIAGNOSIS — Z8249 Family history of ischemic heart disease and other diseases of the circulatory system: Secondary | ICD-10-CM | POA: Insufficient documentation

## 2018-10-20 DIAGNOSIS — D8689 Sarcoidosis of other sites: Secondary | ICD-10-CM | POA: Insufficient documentation

## 2018-10-20 LAB — BASIC METABOLIC PANEL
Anion gap: 12 (ref 5–15)
BUN: 11 mg/dL (ref 6–20)
CO2: 25 mmol/L (ref 22–32)
Calcium: 9.1 mg/dL (ref 8.9–10.3)
Chloride: 101 mmol/L (ref 98–111)
Creatinine, Ser: 0.9 mg/dL (ref 0.44–1.00)
GFR calc Af Amer: >60 mL/min (ref >60)
GFR calc non Af Amer: >60 mL/min (ref >60)
Glucose, Bld: 91 mg/dL (ref 70–99)
Potassium: 4.3 mmol/L (ref 3.5–5.1)
Sodium: 138 mmol/L (ref 135–145)

## 2018-10-20 LAB — BRAIN NATRIURETIC PEPTIDE: B Natriuretic Peptide: 52.8 pg/mL (ref 0.0–100.0)

## 2018-10-20 NOTE — Progress Notes (Signed)
6 minute walk: Pt ambulated 359 meters. Starting HR 78, O2 99% on 2L. While walking HR range 103-110. O2 Range 81%-93%.  Pt utilized oxygen [place] 6L.  Pt denied dizziness or SOB.   Pt tolerated well.  Dr Erin Zavala aware of results of walk.

## 2018-10-20 NOTE — Patient Instructions (Signed)
Labs done today  Your physician recommends that you schedule a follow-up appointment in: 3 months  At the [facility], you and your health needs are our priority. As part of our continuing mission to provide you with exceptional heart care, we have created designated Provider Care Teams. These Care Teams include your primary Cardiologist (physician) and Advanced Practice Providers (APPs- Physician Assistants and Nurse Practitioners) who all work together to provide you with the care you need, when you need it.   You may see any of the following providers on your designated Care Team at your next follow up: [name] Dr Glori Bickers . Dr Loralie Champagne . Darrick Grinder, NP   Please be sure to bring in all your medications bottles to every appointment.

## 2018-10-20 NOTE — Progress Notes (Signed)
2D Echocardiogram has been performed.  Matilde Bash 10/20/2018, 8:58 AM

## 2018-10-20 NOTE — Progress Notes (Signed)
Date:  10/20/2018   ID:  Erin Zavala, DOB 1965-09-27, MRN [id number]  Provider location:  Advanced Heart Failure Type of Visit: Established patient  PCP:  Patient, No Pcp Per  Cardiologist: Dr. [name]   History of Present Illness: Erin Zavala is a 53 y.o. female who has history of sarcoidosis, chronic hypoxemic respiratory failure on home oxygen, and pulmonary hypertension.  She was diagnosed with ocular sarcoidosis in 1997. It seems like she did not have significant lung complications (that were recognized at least) until early 2017.  She now requires home oxygen.  CTA chest shows changes consistent with sarcoidosis, this has been confirmed by biopsy.  Echo in 8/17 had evidence for RV strain and elevated PA pressure.  [place] in 10/17 confirmed pulmonary arterial hypertension with normal right and left heart filling pressures.    She has started on macitentan followed by Adcirca and most recently Tyvaso.  She did not tolerate selexipag well. She feels like these medications have helped her breathing.    She went to [facility] for transplant evaluation in 4/18.  As part of that evaluation, she had right and left heart cath.  Surprisingly, given age and lack of family history, she had an 80% LAD stenosis.  This was treated with DES.  RHC showed mild to moderate pulmonary hypertension. Transplant evaluation is currently on hold as she has been doing better symptomatically.   Echo was done today and reviewed, EF 55-60%, normal RV, PASP 40 mmHg.   She returns for followup of pulmonary hypertension.  She is using 1 L oxygen by [place] at rest, 3 L with exertion.  Generally doing ok, has occasional bronchitis/sarcoid flare-up, currently taking prednisone 5 mg daily.  No dyspnea walking on flat ground. She is going to work but trying to take precautions to avoid coronavirus.    Labs (9/17): NA negative, ESR 112, anti-dsDNA negative, anti-CCP negative, RF < 14.  ACE 136.  Labs (10/17): K 4.1,  creatinine 0.8 Labs (11/17): K 3.8, creatinine 0.91 Labs (3/18): BNP 16 Labs (5/18): K 4.6, creatinine 0.74 Labs (7/18): creatinine 0.79 Labs (9/18): K 4.3, creatinine 0.76 Labs (10/18): LDL 62, HDL 40 Labs (12/19): K 3.6, creatinine 0.73 Labs (1/20): BNP 17.7 Labs (3/20): LDL 67, HDL 65, TGs 185  6 minute walk (10/17): 305 m.  6 minute walk (1/18): 305 m 6 minute walk (3/18): 270 m 6 minute walk (6/18): 302 m 6 minute walk (9/18): 396 m 6 minute walk (2/19): 213 m (could have kept going but was stopped due to decreased oxygen saturation).  6 minute walk (4/19): 396 m 6 minute walk (7/19): 381 m 6 minute walk (8/20) 359 m  PMH: 1. GERD 2. Pulmonary hypertension: Suspect Group 5 PH related to sarcoidosis.  - CTA chest (8/17) with no PE but mediastinal/hilar LAN, cystic lung changes, subpleural consolidation, patchy ground glass.  - Echo (8/17): EF 55-60%, D-shaped interventricular septum, PASP 60 mmHg. - ANA negative, ESR 112, anti-dsDNA negative, anti-CCP negative, RF < 14.  - RHC (10/17): mean RA 3, PA 59/22 mean 36, mean PCWP 5, CI 2.92, PVR 7.4 WU.  - PFTs (10/17): Restrictive.  - RHC (4/18): mean RA 4, PA mean 32, mean PCWP 8, CI 2.9, PVR 5.2 WU.  - Did not tolerate selexipag.  - Echo (5/19): EF 60-65%, mild LVH, mild MR, RV normal size and systolic function, PASP 37 mmHg.  - Echo (8/20): EF 55-60%, normal RV size and systolic function, PASP 40  mmHg, IVC normal 3. Sarcoidosis: Diagnosed with ocular sarcoidosis in 1997. Significant pulmonary involvement noted in 8/17.  Lung biopsy positive.  - Cardiac MRI (5/18): EF 66%, normal RV size with low normal RV systolic function (EF 83%), no LGE (no evidence for cardiac sarcoidosis).  - CT chest 3/19 with stable pulmonary fibrotic changes related to sarcoidosis.  - PFTs (4/19): FVC 66%, FEV1 69%, ratio 103%, DLCO 37%, TLC 62% => moderate -severe mixed obstruction/restriction.  4. Chronic hypoxemic respiratory failure: Home oxygen  5. Childhood asthma. 6. CAD: LHC (4/18) with 80% LAD stenosis => DES to LAD.   Current Outpatient Medications  Medication Sig Dispense Refill  . ADCIRCA 20 MG tablet Take 2 tablets (40 mg total) by mouth daily. 180 tablet 3  . albuterol (PROVENTIL HFA;VENTOLIN HFA) 108 (90 Base) MCG/ACT inhaler Inhale 2 puffs into the lungs every 4 (four) hours as needed for wheezing or shortness of breath. 1 Inhaler 5  . ambrisentan (LETAIRIS) 10 MG tablet Take 1 tablet (10 mg total) by mouth daily. 30 tablet 11  . aspirin 81 MG tablet Take 1 tablet (81 mg total) by mouth daily. 30 tablet 0  . atorvastatin (LIPITOR) 40 MG tablet TAKE 1 TABLET BY MOUTH EVERY DAY 90 tablet 1  . budesonide-formoterol (SYMBICORT) 160-4.5 MCG/ACT inhaler Inhale 2 puffs into the lungs 2 (two) times daily. 2 Inhaler 0  . cetirizine (ZYRTEC) 10 MG tablet Take 1 tablet (10 mg total) by mouth daily. 30 tablet 5  . esomeprazole (NEXIUM) 40 MG capsule TAKE 1 CAPSULE BY MOUTH EVERY DAY 30 capsule 3  . ferrous sulfate 325 (65 FE) MG tablet TAKE 1 TABLET (325 MG TOTAL) BY MOUTH 2 (TWO) TIMES DAILY WITH A MEAL. 60 tablet 5  . ipratropium-albuterol (DUONEB) 0.5-2.5 (3) MG/3ML SOLN Take 3 mLs by nebulization every 6 (six) hours as needed. 360 mL 11  . OXYGEN 2-3 lpm 24/7  AHC    . predniSONE (DELTASONE) 5 MG tablet Take 28m for 2 weeks then 558mdaily 45 tablet 1  . Treprostinil (TYVASO) 0.6 MG/ML SOLN Inhale 18 mcg into the lungs 4 (four) times daily.     No current facility-administered medications for this encounter.     Allergies:   Patient has no known allergies.   Social History:  The patient  reports that she quit smoking about 7 years ago. Her smoking use included cigarettes. She has a 2.50 pack-year smoking history. She has never used smokeless tobacco. She reports that she does not drink alcohol or use drugs.   Family History:  The patient's family history includes Hypertension in her father and mother; Sarcoidosis in her  maternal aunt.   ROS:  Please see the history of present illness.   All other systems are personally reviewed and negative.   Exam:   BP 110/82   Pulse 74   Wt 70.2 kg (154 lb 12.8 oz)   SpO2 98% Comment: 2L of oxygen  BMI 29.25 kg/m  General: NAD Neck: No JVD, no thyromegaly or thyroid nodule.  Lungs: Occasional rhonchi CV: Nondisplaced PMI.  Heart regular S1/S2, no S3/S4, no murmur.  No peripheral edema.  No carotid bruit.  Normal pedal pulses.  Abdomen: Soft, nontender, no hepatosplenomegaly, no distention.  Skin: Intact without lesions or rashes.  Neurologic: Alert and oriented x 3.  Psych: Normal affect. Extremities: No clubbing or cyanosis.  HEENT: Normal.    Recent Labs: 02/17/2018: Hemoglobin 13.0; Platelets 298.0 06/02/2018: ALT 27 10/20/2018: B Natriuretic Peptide 52.8;  BUN 11; Creatinine, Ser 0.90; Potassium 4.3; Sodium 138  Personally reviewed   Wt Readings from Last 3 Encounters:  10/20/18 70.2 kg (154 lb 12.8 oz)  09/18/18 69.9 kg (154 lb)  09/08/18 69.9 kg (154 lb)      ASSESSMENT AND PLAN:  1. Sarcoidosis: Ocular and lung involvement, biopsy-proven.  Cardiac MRI in 5/18 did not show evidence for cardiac sarcoidosis.  She is on prednisone and home oxygen. She has been evaluated at [facility] for lung transplant, this is currently on hold as she has clinically improved on PH meds.   - Follows with Dr. [name].  Currently on prednisone at 5 mg daily.  2. Pulmonary hypertension: Pulmonary arterial hypertension. Suspect mixed group 1 and group 5 PH. CTA chest showed no PE but did show changes of sarcoidosis.  PFTs were restrictive.  Echo showed RV strain and elevated PA pressure.  PAH was confirmed by [place] in 10/17 with PVR 7.4 WU.  Serologic workup was negative.  [place] in 4/18 showed mild to moderate residual pulmonary hypertension. She has felt like generic tadalafil has not been as effective as brand name Adcirca.  She is tolerating Tyvaso (unable to tolerate Malvin Johns).  Echo  was done today and reviewed, showing PASP 40 mmHg with normal-appearing RV.  6 minute walk today was fairly stable. - Continue ambrisentan.   - Continue brand-name Adcirca 40 mg daily.  - Continue Tyvaso.   - Check BNP today.  3. CAD: Found incidentally on cath 4/18 done as part of workup for lung transplantation.  80% LAD stenosis, treated with DES to LAD.   No chest pain.  - Continue ASA 81 mg daily.   - Continue atorvastatin, good lipids in 3/20.   Recommended follow-up:  3 months  Signed, Loralie Champagne, MD  10/20/2018  [place] [address] [facility] [place] [address] [phone] (office) [phone] (fax)

## 2018-10-28 ENCOUNTER — Telehealth: Payer: Self-pay | Admitting: Adult Health

## 2018-10-28 MED ORDER — IPRATROPIUM-ALBUTEROL 0.5-2.5 (3) MG/3ML IN SOLN: 3.0000 mL | Freq: Four times a day (QID) | RESPIRATORY_TRACT | 11 refills | Status: DC | PRN

## 2018-10-28 NOTE — Telephone Encounter (Signed)
Refill of duoneb sol has been sent to pt's preferred pharmacy. Called and spoke with pt letting her know this had been done and pt verbalized understanding. Nothing further needed.

## 2018-11-04 ENCOUNTER — Other Ambulatory Visit: Payer: Self-pay

## 2018-11-04 ENCOUNTER — Ambulatory Visit (INDEPENDENT_AMBULATORY_CARE_PROVIDER_SITE_OTHER): Payer: Self-pay | Admitting: Emergency Medicine

## 2018-11-04 VITALS — BP 120/76 | HR 82

## 2018-11-04 DIAGNOSIS — I272 Pulmonary hypertension, unspecified: Secondary | ICD-10-CM

## 2018-11-04 DIAGNOSIS — D860 Sarcoidosis of lung: Secondary | ICD-10-CM

## 2018-11-04 DIAGNOSIS — Z23 Encounter for immunization: Secondary | ICD-10-CM

## 2018-11-04 DIAGNOSIS — J9611 Chronic respiratory failure with hypoxia: Secondary | ICD-10-CM

## 2018-11-04 NOTE — Assessment & Plan Note (Signed)
Stable on 3L [place]. Continue therapy

## 2018-11-04 NOTE — Assessment & Plan Note (Signed)
Follows with CHF clinic. No increased dyspnea. She is to continue following with the heart failure clinic. Also continue taking the Adcirca, Letairis and Tyvaso.

## 2018-11-04 NOTE — Assessment & Plan Note (Addendum)
She noted stability in her symptoms today and appears well. I feel her flare may have been due to her exposure at work. We will attempt to taper her prednisone again today. She will taper this by taking 69m altered with 2.5486mevery other day for two weeks and then 2.86m33maily until she sees Dr. ByrLamonte Sakaiain. She is continue your symbicort and albuterol. She is to keep a log of how often she uses her albuterol as this may change how we alter her therapy.   Continue symbicort, albuterol, duonebs and steroid taper

## 2018-11-04 NOTE — Patient Instructions (Addendum)
Thank you for your visit to the [facility] clinic today. It was a pleasure meeting you. We would like for you to see Dr. [name] again in 4 weeks after your complete the prednisone taper.  Please start taking your prednisone at 48m altered with 2.579mevery other day. So Today take 63m69mtomorrow 2.63mg663md so on for two weeks and then 2.5 daily until you see Dr. ByruLamonte Sakaier. Please continue your symbicort and albuterol. Please keep a log of how often you use your albuterol as this may change how we alter your therapy.   For you pulmonary hypertension please continue following with the heart failure clinic. Also continue taking the Adcirca, Letairis and Tyvaso.

## 2018-11-04 NOTE — Progress Notes (Addendum)
Subjective:    Patient ID: Erin Zavala, female    DOB: 1965/12/26, 53 y.o.   MRN: [id number]  Shortness of Breath Pertinent negatives include no abdominal pain, chest pain, leg swelling, rhinorrhea or wheezing.   ROV 04/04/18 --Ms. Erin Zavala is 22, former smoker, has history of sarcoidosis with associated interstitial lung disease, associated obstructive lung disease, history of ocular involvement.  She has severe secondary pulmonary hypertension and has been managed on oxygen (1 -3 liters per minute), Adcirca, Letairis, Tyvaso.  Bronchodilator regimen includes Symbicort bid, albuterol nebs about once a day.  She was treated for what sounds like an acute bronchitis, flare of obstructive lung disease in early December with a prednisone taper, now back down to 2.5 mg daily, her chronic dose. She has more nasal gtt, cough with clear to yellow, mostly at night. She has some globus sensation, on nexium qd. Not on anti-histamine right now.   She was seen 1/21 in the advanced CHF clinic, continued on Adcirca, ambrisentan, Tyvaso.  6-minute walk was deferred and is planned for next visit. Last CT 05/2017, last TTE 07/2017.   ROV 09/08/2018 --53 year old former smoker with sarcoidosis that has caused associated interstitial lung disease, obstructive lung disease, ocular involvement and secondary pulmonary hypertension.  She is maintained on supplemental oxygen and uses Adcirca, Letairis, Tyvaso.  Her bronchodilator regimen includes Symbicort.  Since I last saw her she had flaring of cough that prompted an increase in her prednisone to 10 mg daily. Seemed to have been proceeded by URI beginning of the year.  Efforts have been made to wean but she has had recurrence of cough on her previous dose of 2.5 mg daily.  Her cough is also influenced by rhinitis, GERD, both treated. Has been exposed top more dust at work lately. Denies any syncope, HA, CP.   Repeat CT scan of the chest on 06/03/2018 reviewed by me, shows  persistent calcified mediastinal and hilar lymphadenopathy, groundglass parenchymal changes bilaterally with associated fibrotic change, traction bronchiectasis and bronchiolectasis.  No significant change compared with 06/07/2017.   11/04/2018 - The patient returns today for evaluation of her Sarcoidosis. She stated that she feels well after increasing her steroids to 25m and tapering this back to the 55mwhere she is today.  She denied an increase in SOB or increased supplemental oxygen demand. She noted that she had been working in a location at [place] she was exposed to large air mail bags that are old, dirty, musty smelling and seem to have triggered her symptoms. She is requesting to decrease her prednisone dose today.   Echo from 10/20/2018 with LVEF of 5592-11%PA systolic pressure [id number]    Review of Systems  Constitutional: Positive for unexpected weight change (WIth increased steroid dose). Negative for fatigue.  HENT: Negative for rhinorrhea and sinus pain.   Eyes: Negative for discharge and redness.  Respiratory: Positive for shortness of breath. Negative for cough, chest tightness and wheezing.   Cardiovascular: Negative for chest pain, palpitations and leg swelling.  Gastrointestinal: Negative for abdominal distention and abdominal pain.  Musculoskeletal: Negative for myalgias.  Neurological: Negative for light-headedness.    Past Medical History:  Diagnosis Date  . Asthma    as a child  . Cough   . Dyspnea   . GERD (gastroesophageal reflux disease)   . Migraines   . Oxygen dependent   . Pulmonary hypertension ([place]  . Sarcoidosis      Family History  Problem  Relation Age of Onset  . Hypertension Mother   . Hypertension Father   . Sarcoidosis Maternal Aunt   . Rheumatologic disease Neg Hx      Social History   Socioeconomic History  . Marital status: Married    Spouse name: Not on file  . Number of children: Not on file  . Years of  education: Not on file  . Highest education level: Not on file  Occupational History  . Not on file  Social Needs  . Financial resource strain: Not on file  . Food insecurity    Worry: Not on file    Inability: Not on file  . Transportation needs    Medical: Not on file    Non-medical: Not on file  Tobacco Use  . Smoking status: Former Smoker    Packs/day: 0.25    Years: 10.00    Pack years: 2.50    Types: Cigarettes    Quit date: 01/11/2011    Years since quitting: 7.8  . Smokeless tobacco: Never Used  Substance and Sexual Activity  . Alcohol use: No  . Drug use: No  . Sexual activity: Not on file  Lifestyle  . Physical activity    Days per week: Not on file    Minutes per session: Not on file  . Stress: Not on file  Relationships  . Social Herbalist on phone: Not on file    Gets together: Not on file    Attends religious service: Not on file    Active member of club or organization: Not on file    Attends meetings of clubs or organizations: Not on file    Relationship status: Not on file  . Intimate partner violence    Fear of current or ex partner: Not on file    Emotionally abused: Not on file    Physically abused: Not on file    Forced sexual activity: Not on file  Other Topics Concern  . Not on file  Social History Narrative   Patient denies any bird or mold exposure. Currently works with exposure to inhaled perfumes and chemicals. 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 Previous exposure to inhaled dust while working in [employer]. Denies any IV drug use or previous incarceration. No known tuberculosis exposure. She has never lived nor volunteered at a homeless shelter.     No Known Allergies   Outpatient Medications Prior to Visit  Medication Sig Dispense Refill  . ADCIRCA 20 MG tablet Take 2 tablets (40 mg total) by mouth daily. 180 tablet 3  . albuterol (PROVENTIL HFA;VENTOLIN  HFA) 108 (90 Base) MCG/ACT inhaler Inhale 2 puffs into the lungs every 4 (four) hours as needed for wheezing or shortness of breath. 1 Inhaler 5  . ambrisentan (LETAIRIS) 10 MG tablet Take 1 tablet (10 mg total) by mouth daily. 30 tablet 11  . aspirin 81 MG tablet Take 1 tablet (81 mg total) by mouth daily. 30 tablet 0  . atorvastatin (LIPITOR) 40 MG tablet TAKE 1 TABLET BY MOUTH EVERY DAY 90 tablet 1  . budesonide-formoterol (SYMBICORT) 160-4.5 MCG/ACT inhaler Inhale 2 puffs into the lungs 2 (two) times daily. 2 Inhaler 0  . cetirizine (ZYRTEC) 10 MG tablet Take 1 tablet (10 mg total) by mouth daily. 30 tablet 5  . esomeprazole (NEXIUM) 40 MG capsule TAKE 1 CAPSULE BY MOUTH EVERY DAY 30 capsule 3  . ferrous sulfate 325 (65  FE) MG tablet TAKE 1 TABLET (325 MG TOTAL) BY MOUTH 2 (TWO) TIMES DAILY WITH A MEAL. 60 tablet 5  . ipratropium-albuterol (DUONEB) 0.5-2.5 (3) MG/3ML SOLN Take 3 mLs by nebulization every 6 (six) hours as needed. 360 mL 11  . OXYGEN 2-3 lpm 24/7  A[facility]    . predniSONE (DELTASONE) 5 MG tablet Take 22m for 2 weeks then 574mdaily 45 tablet 1  . Treprostinil (TYVASO) 0.6 MG/ML SOLN Inhale 18 mcg into the lungs 4 (four) times daily.     No facility-administered medications prior to visit.         Objective:   Physical Exam  Constitutional: She is oriented to person, place, and time. She appears well-developed and well-nourished. No distress.  HENT:  Head: Normocephalic and atraumatic.  Eyes: Pupils are equal, round, and reactive to light. EOM are normal. Right eye exhibits no discharge. Left eye exhibits no discharge.  Cardiovascular: Normal rate and regular rhythm.  No murmur heard. Pulmonary/Chest: Effort normal and breath sounds normal. No respiratory distress. She has no wheezes.  Abdominal: She exhibits no distension. There is no abdominal tenderness.  Neurological: She is alert and oriented to person, place, and time.  Skin: Skin is warm and dry.   Vitals:    11/04/18 0853  BP: 120/76  Pulse: 82  SpO2: 99%       Assessment & Plan:  Pulmonary hypertension (HCThree RiversFollows with CHF clinic. No increased dyspnea. She is to continue following with the heart failure clinic. Also continue taking the Adcirca, Letairis and Tyvaso.   Pulmonary sarcoidosis (HCErwinvilleShe noted stability in her symptoms today and appears well. I feel her flare may have been due to her exposure at work. We will attempt to taper her prednisone again today. She will taper this by taking 77m34mltered with 2.77mg37mery other day for two weeks and then 2.77mg 21mly until she sees Dr. ByrumLamonte Sakain. She is continue your symbicort and albuterol. She is to keep a log of how often she uses her albuterol as this may change how we alter her therapy.   Continue symbicort, albuterol and steroid taper  Chronic respiratory failure with hypoxia (HCC) Stable on 3L [place]. Continue therapy    LawreKathi LudwigCone Sanford Rock Rapids Medical Centerrnal Medicine, PGY-3  Please see A/P and/or Addendum for final recommendations by: Dr. ByrumLamonte Sakai

## 2018-11-07 ENCOUNTER — Encounter: Payer: Self-pay | Admitting: Emergency Medicine

## 2018-11-11 ENCOUNTER — Other Ambulatory Visit: Payer: Self-pay | Admitting: Adult Health

## 2018-11-21 ENCOUNTER — Telehealth: Payer: Self-pay | Admitting: Emergency Medicine

## 2018-11-21 NOTE — Telephone Encounter (Signed)
Spoke with pt she states she needs documentation/letter to have her in a seated position due to her respiratory problems. She didn't know if she needed a letter but she is not able to work on the machines and they plan to put her back on the machine Saturday. She is on oxygen and she doesn't think that she will ever be on the machines again. Her tank and cord is a hazard and the machine is very fast paced. She states she never needed a letter but they have a new general and now it is required. Dr. [name] please advise on writing letter for pt.

## 2018-11-24 NOTE — Telephone Encounter (Signed)
Pt tried to do the job on Friday but was not able to so she will need a note please

## 2018-11-25 ENCOUNTER — Encounter: Payer: Self-pay | Admitting: Emergency Medicine

## 2018-11-25 NOTE — Telephone Encounter (Signed)
Letter is done. Should be available via MyChart.

## 2018-11-25 NOTE — Telephone Encounter (Signed)
Spoke with patient. She is aware that RB has written the letter for her. She has requested to have the letter faxed to her at [phone]. Advised her that I would do so right now.   Nothing further needed at time of call.

## 2018-12-02 ENCOUNTER — Encounter: Payer: Self-pay | Admitting: Emergency Medicine

## 2018-12-02 ENCOUNTER — Ambulatory Visit (INDEPENDENT_AMBULATORY_CARE_PROVIDER_SITE_OTHER): Payer: Self-pay | Admitting: Emergency Medicine

## 2018-12-02 ENCOUNTER — Telehealth (HOSPITAL_COMMUNITY): Payer: Self-pay | Admitting: Pharmacy Technician

## 2018-12-02 ENCOUNTER — Other Ambulatory Visit: Payer: Self-pay

## 2018-12-02 VITALS — BP 124/70 | HR 87 | Ht 60.0 in | Wt 160.0 lb

## 2018-12-02 DIAGNOSIS — D869 Sarcoidosis, unspecified: Secondary | ICD-10-CM

## 2018-12-02 DIAGNOSIS — I272 Pulmonary hypertension, unspecified: Secondary | ICD-10-CM

## 2018-12-02 DIAGNOSIS — R0902 Hypoxemia: Secondary | ICD-10-CM

## 2018-12-02 DIAGNOSIS — D860 Sarcoidosis of lung: Secondary | ICD-10-CM

## 2018-12-02 NOTE — Assessment & Plan Note (Addendum)
Plan to try to decrease prednisone again, see if she tolerates.  She last had her CT chest in March 2020, likely repeat spring 2021 unless there is a significant clinical change.  She is interested in going back to pulmonary rehab and I think this is a great idea.  She will need paperwork to justify her medical condition to the [employer].  There is good to be an accommodation board to help redefine her duties as she can no longer perform the same as over the past years.  Please continue Symbicort 2 puffs twice daily.  Rinse and gargle after using. Keep your albuterol available to use 2 puffs if needed for shortness of breath, chest tightness, wheezing. Try decreasing your prednisone to 2.5 mg once a day.  If you develop recurrent shortness of breath, cough then you can go back to alternating 2.5 with 5 mg every other day. We will refer you to pulmonary rehab to reinitiate your exercise and conditioning program Flu shot up-to-date Follow with Dr [name] in 3 months or sooner if you have any problems.

## 2018-12-02 NOTE — Progress Notes (Signed)
Subjective:    Patient ID: Erin Zavala, female    DOB: 1966-02-10, 53 y.o.   MRN: [id number]  Shortness of Breath Pertinent negatives include no abdominal pain, chest pain, leg swelling, rhinorrhea or wheezing.   ROV 04/04/18 --Ms. Erin Zavala is 73, former smoker, has history of sarcoidosis with associated interstitial lung disease, associated obstructive lung disease, history of ocular involvement.  She has severe secondary pulmonary hypertension and has been managed on oxygen (1 -3 liters per minute), Adcirca, Letairis, Tyvaso.  Bronchodilator regimen includes Symbicort bid, albuterol nebs about once a day.  She was treated for what sounds like an acute bronchitis, flare of obstructive lung disease in early December with a prednisone taper, now back down to 2.5 mg daily, her chronic dose. She has more nasal gtt, cough with clear to yellow, mostly at night. She has some globus sensation, on nexium qd. Not on anti-histamine right now.   She was seen 1/21 in the advanced CHF clinic, continued on Adcirca, ambrisentan, Tyvaso.  6-minute walk was deferred and is planned for next visit. Last CT 05/2017, last TTE 07/2017.   ROV 09/08/2018 --53 year old former smoker with sarcoidosis that has caused associated interstitial lung disease, obstructive lung disease, ocular involvement and secondary pulmonary hypertension.  She is maintained on supplemental oxygen and uses Adcirca, Letairis, Tyvaso.  Her bronchodilator regimen includes Symbicort.  Since I last saw her she had flaring of cough that prompted an increase in her prednisone to 10 mg daily. Seemed to have been proceeded by URI beginning of the year.  Efforts have been made to wean but she has had recurrence of cough on her previous dose of 2.5 mg daily.  Her cough is also influenced by rhinitis, GERD, both treated. Has been exposed top more dust at work lately. Denies any syncope, HA, CP.   Repeat CT scan of the chest on 06/03/2018 reviewed by me, shows  persistent calcified mediastinal and hilar lymphadenopathy, groundglass parenchymal changes bilaterally with associated fibrotic change, traction bronchiectasis and bronchiolectasis.  No significant change compared with 06/07/2017.   11/04/2018 - The patient returns today for evaluation of her Sarcoidosis. She stated that she feels well after increasing her steroids to 15m and tapering this back to the 542mwhere she is today.  She denied an increase in SOB or increased supplemental oxygen demand. She noted that she had been working in a [place] she was exposed to large air mail bags that are old, dirty, musty smelling and seem to have triggered her symptoms. She is requesting to decrease her prednisone dose today.   Echo from 10/20/2018 with LVEF of 5502-58%PA systolic pressure 4052DPOEStable   ROV 12/02/2018 --Erin Zavala is 53, follows up today for her sarcoidosis with associated abnormal CT scan of the chest, secondary pulmonary hypertension.  She is currently on prednisone at 5 alternated w 2.5 daily.  She was trying to get back to work, works at the post office. 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 Remains on adcirca, letaris, tyvaso. Remains on symbicort. Last Ct chest March 2020. She overall feels stable, better exercise tolerance. O2 is set on 2-3L/min, titrates w activity. Flu shot up to date. She is working on losing some weight - wants to exercise. [facility]   Review of Systems  Constitutional: Positive for unexpected weight change (WIth increased steroid dose). Negative for fatigue.  HENT: Negative for  rhinorrhea and sinus pain.   Eyes: Negative for discharge and redness.  Respiratory: Positive for shortness of breath. Negative for cough, chest tightness and wheezing.   Cardiovascular: Negative for chest pain, palpitations and leg swelling.  Gastrointestinal: Negative for abdominal distention and  abdominal pain.  Musculoskeletal: Negative for myalgias.  Neurological: Negative for light-headedness.     No Known Allergies   Outpatient Medications Prior to Visit  Medication Sig Dispense Refill  . ADCIRCA 20 MG tablet Take 2 tablets (40 mg total) by mouth daily. 180 tablet 3  . albuterol (PROVENTIL HFA;VENTOLIN HFA) 108 (90 Base) MCG/ACT inhaler Inhale 2 puffs into the lungs every 4 (four) hours as needed for wheezing or shortness of breath. 1 Inhaler 5  . ambrisentan (LETAIRIS) 10 MG tablet Take 1 tablet (10 mg total) by mouth daily. 30 tablet 11  . aspirin 81 MG tablet Take 1 tablet (81 mg total) by mouth daily. 30 tablet 0  . atorvastatin (LIPITOR) 40 MG tablet TAKE 1 TABLET BY MOUTH EVERY DAY 90 tablet 1  . budesonide-formoterol (SYMBICORT) 160-4.5 MCG/ACT inhaler Inhale 2 puffs into the lungs 2 (two) times daily. 2 Inhaler 0  . cetirizine (ZYRTEC) 10 MG tablet Take 1 tablet (10 mg total) by mouth daily. 30 tablet 5  . esomeprazole (NEXIUM) 40 MG capsule TAKE 1 CAPSULE BY MOUTH EVERY DAY 30 capsule 3  . ferrous sulfate 325 (65 FE) MG tablet TAKE 1 TABLET (325 MG TOTAL) BY MOUTH 2 (TWO) TIMES DAILY WITH A MEAL. 60 tablet 5  . ipratropium-albuterol (DUONEB) 0.5-2.5 (3) MG/3ML SOLN Take 3 mLs by nebulization every 6 (six) hours as needed. 360 mL 11  . OXYGEN 2-3 lpm 24/7  [facility]    . predniSONE (DELTASONE) 5 MG tablet Take 5 mg by mouth daily with breakfast.    . Treprostinil (TYVASO) 0.6 MG/ML SOLN Inhale 18 mcg into the lungs 4 (four) times daily.    . predniSONE (DELTASONE) 5 MG tablet TAKE 2 TABLETS BY MOUTH DAILY FOR 2 WEEKS, THEN TAKE 1 TABLET DAILY 45 tablet 1   No facility-administered medications prior to visit.         Objective:    Vitals:   12/02/18 0913  BP: 124/70  Pulse: 87  SpO2: 100%  Weight: 160 lb (72.6 kg)  Height: 5' (1.524 m)  Gen: Pleasant, well-nourished, in no distress,  normal affect  ENT: No lesions,  mouth clear,  oropharynx clear, no postnasal  drip  Neck: No JVD, no stridor  Lungs: No use of accessory muscles, no crackles or wheezing on normal respiration, no wheeze on forced expiration  Cardiovascular: RRR, heart sounds normal, no murmur or gallops, no peripheral edema  Musculoskeletal: No deformities, no cyanosis or clubbing  Neuro: alert, awake, non focal  Skin: Warm, no lesions or rash     Assessment & Plan:  Pulmonary sarcoidosis ([place]) Plan to try to decrease prednisone again, see if she tolerates.  She last had her CT chest in March 2020, likely repeat spring 2021 unless there is a significant clinical change.  She is interested in going back to pulmonary rehab and I think this is a great idea.  She will need paperwork to justify her medical condition to [employer].  There is good to be an accommodation board to help redefine her duties as she can no longer perform the same as over the past years.  Please continue Symbicort 2 puffs twice daily.  Rinse and gargle after using. Keep  your albuterol available to use 2 puffs if needed for shortness of breath, chest tightness, wheezing. Try decreasing your prednisone to 2.5 mg once a day.  If you develop recurrent shortness of breath, cough then you can go back to alternating 2.5 with 5 mg every other day. We will refer you to pulmonary rehab to reinitiate your exercise and conditioning program Flu shot up-to-date Follow with Dr [name] in 3 months or sooner if you have any problems.  Pulmonary hypertension ([place]) On a stable regimen of Adcirca, Letairis, Tyvaso.  Most recent echocardiogram was from August, stable PASP 40.  Continue to follow with echocardiograms, 6-minute walk.  Hypoxemia 2 to 3 L/min at all times

## 2018-12-02 NOTE — Assessment & Plan Note (Signed)
2 to 3 L/min at all times

## 2018-12-02 NOTE — Patient Instructions (Addendum)
Please continue Symbicort 2 puffs twice daily.  Rinse and gargle after using. Keep your albuterol available to use 2 puffs if needed for shortness of breath, chest tightness, wheezing. Try decreasing your prednisone to 2.5 mg once a day.  If you develop recurrent shortness of breath, cough then you can go back to alternating 2.5 with 5 mg every other day. Continue your Latera's, Lance Coon as you have been taking them. Continue your oxygen at 2 to 3 L/min We will refer you to pulmonary rehab to reinitiate your exercise and conditioning program Flu shot up-to-date Follow with Dr [name] in 3 months or sooner if you have any problems.

## 2018-12-02 NOTE — Assessment & Plan Note (Signed)
On a stable regimen of Adcirca, Letairis, Tyvaso.  Most recent echocardiogram was from August, stable PASP 40.  Continue to follow with echocardiograms, 6-minute walk.

## 2018-12-02 NOTE — Telephone Encounter (Signed)
Received re-enrollment paperwork from assist for patient to get Adcrica and Tyvaso for another year. Patient does not have insurance after doing an eligibility check so we will proceed with patient assistance.   Will send in via fax after provider portion is filled in.  Will continue to follow.  Charlann Boxer, CPhT

## 2018-12-04 ENCOUNTER — Other Ambulatory Visit (HOSPITAL_COMMUNITY): Payer: Self-pay | Admitting: Cardiology

## 2018-12-05 NOTE — Telephone Encounter (Signed)
Sent in application to [phone].  Will continue to follow.  Charlann Boxer, CPhT

## 2018-12-22 ENCOUNTER — Telehealth (INDEPENDENT_AMBULATORY_CARE_PROVIDER_SITE_OTHER): Payer: Self-pay | Admitting: Emergency Medicine

## 2018-12-22 ENCOUNTER — Encounter: Payer: Self-pay | Admitting: Emergency Medicine

## 2018-12-22 DIAGNOSIS — J449 Chronic obstructive pulmonary disease, unspecified: Secondary | ICD-10-CM

## 2018-12-22 DIAGNOSIS — I272 Pulmonary hypertension, unspecified: Secondary | ICD-10-CM

## 2018-12-22 DIAGNOSIS — J069 Acute upper respiratory infection, unspecified: Secondary | ICD-10-CM | POA: Insufficient documentation

## 2018-12-22 DIAGNOSIS — D860 Sarcoidosis of lung: Secondary | ICD-10-CM

## 2018-12-22 NOTE — Assessment & Plan Note (Signed)
We will discuss timing of any repeat imaging at her next office visit

## 2018-12-22 NOTE — Patient Instructions (Signed)
We will see you back in 2 months. Please call our office if your symptoms worsen.

## 2018-12-22 NOTE — Assessment & Plan Note (Signed)
This sounds like a viral URI although possibly flaring allergies as she notes that her allergy symptoms do often worsen when it is rainy or humid.  She rightly was concerned about the potential for this to be COVID-19.  In absence of fever, aches, significant fatigue I think that is unlikely.  All the same if her symptoms persist or if fever, fatigue evolve then I think we should send her for COVID-19 evaluation.  Of asked her to call me on 10/14 to give [place] an update on her symptoms.  I have also asked her to stay out of work until 10/14 possibly to return on 10/15 depending on how she is doing.. Symptomatic relief with Mucinex DM, agree with adding Flonase to Zyrtec.

## 2018-12-22 NOTE — Assessment & Plan Note (Signed)
Continue [name]

## 2018-12-22 NOTE — Progress Notes (Signed)
Virtual Visit via Video Note  I connected with Erin Zavala on 12/22/18 at  2:45 PM EDT by a video enabled telemedicine application and verified that I am speaking with the correct person using two identifiers.  Location: Patient: Home Provider: Office   I discussed the limitations of evaluation and management by telemedicine and the availability of in person appointments. The patient expressed understanding and agreed to proceed.  History of Present Illness: 53 year old woman with a history of sarcoidosis, parenchymal disease by CT chest, secondary pulmonary hypertension chronic hypoxemic respiratory failure.  She is currently managed on prednisone.  She is on Symbicort, has albuterol which she is approximately PAH regimen is Adcirca, Latera's, Tyvaso Oxygen need 2 to 3 L/min depending on degree of activity   Observations/Objective: Patient reports that she has been having nasal mucous, cough prod of thick white / clear. No clear sick contacts. No fever. She has been self-isolating. No aches, maybe some mild fatigue. Started some mucinex-DM 3 days ago, also starting [facility] flonase, remains on zyrtec.   We talked about referral to pulmonary rehab, but this is not covered by her insurance.  Assessment and Plan: URI (upper respiratory infection) This sounds like a viral URI although possibly flaring allergies as she notes that her allergy symptoms do often worsen when it is rainy or humid.  She rightly was concerned about the potential for this to be COVID-19.  In absence of fever, aches, significant fatigue I think that is unlikely.  All the same if her symptoms persist or if fever, fatigue evolve then I think we should send her for COVID-19 evaluation.  Of asked her to call me on 10/14 to give [place] an update on her symptoms.  I have also asked her to stay out of work until 10/14 possibly to return on 10/15 depending on how she is doing.. Symptomatic relief with Mucinex DM, agree with adding  Flonase to Zyrtec.  COPD (chronic obstructive pulmonary disease) (HCC) Continue Symbicort  Pulmonary sarcoidosis (HCC) We will discuss timing of any repeat imaging at her next office visit  Pulmonary hypertension ([place]) Continue Adcirca, Tyvaso, Letairis     Follow Up Instructions: 2 months   I discussed the assessment and treatment plan with the patient. The patient was provided an opportunity to ask questions and all were answered. The patient agreed with the plan and demonstrated an understanding of the instructions.   The patient was advised to call back or seek an in-person evaluation if the symptoms worsen or if the condition fails to improve as anticipated.  I provided 22 minutes of non-face-to-face time during this encounter.   Collene Gobble, MD

## 2018-12-22 NOTE — Assessment & Plan Note (Signed)
- 

## 2018-12-25 ENCOUNTER — Encounter: Payer: Self-pay | Admitting: Adult Health

## 2018-12-25 ENCOUNTER — Ambulatory Visit (INDEPENDENT_AMBULATORY_CARE_PROVIDER_SITE_OTHER): Payer: Self-pay | Admitting: Adult Health

## 2018-12-25 ENCOUNTER — Telehealth: Payer: Self-pay | Admitting: Emergency Medicine

## 2018-12-25 DIAGNOSIS — J069 Acute upper respiratory infection, unspecified: Secondary | ICD-10-CM

## 2018-12-25 MED ORDER — AZITHROMYCIN 250 MG PO TABS: ORAL_TABLET | ORAL | 0 refills | Status: AC

## 2018-12-25 NOTE — Progress Notes (Signed)
Virtual Visit via Telephone Note  I connected with Erin Zavala on 12/25/18 at  4:00 PM EDT by telephone and verified that I am speaking with the correct person using two identifiers.  Location: Patient: Home  Provider: Office    I discussed the limitations, risks, security and privacy concerns of performing an evaluation and management service by telephone and the availability of in person appointments. I also discussed with the patient that there may be a patient responsible charge related to this service. The patient expressed understanding and agreed to proceed.   History of Present Illness: 53 year old female former smoker followed for sarcoidosis with associated ILD. , ocular involvement and COPD . She is on SymbicortTwice dailyAnd prednisone 2.36m . She has severe pulmonary HTN . On oxygen 3l/m  She is on ALenore Cordia, and Tyvaso  Today's televisit is for an acute office visit. Complains of 1 week of cough, congestion , drainage , symptoms are getting worse . Now coughing up green mucus.   No fever, body aches, loss of taste or smell. Appetite is good. No increased oxygen demands. Remains on symbicort Twice daily  .  Currently on prednisone to 540mdaily . Was trying to go to prednisone 2.71m17mut breathing got bad , went back to 71mg3mily.    Observations/Objective:  High-resolution CT chest March 2019 showed mediastinal hilar and pulmonary parenchymal fibrotic changes of sarcoid  2D echo May 2019 showed EF 60 to 65%, pulmonary artery pressure 37 mmHg (significant improvement from previous pulmonary pressure at 60 mmHg)  PFT April 2019 showed FEV1 74%, ratio 87, FVC 68%, no significant bronchodilator response. Positive mid flow reversibility. DLCO 34%.  [facility] evaluation for transplant eval ,right and left heart cath. Showed 80% LAD stenosis. She was treated with DES.Right heart cath showed mild to moderate pulmonary hypertension.  HRCT chest 3/2020showed stable  chronic changes with diffuse groundglass opacities, moderate pulmonary fibrosis and traction bronchiectasis. No significant change since March 2019, notable progression since August 2017.  Assessment and Plan: Acute Bronchitis -low suspicion for COVID 19 but since acute resp sx will check .   Chronic resp failure -stable on O2 , no increased demands.   Pulmonary HTN - cont curren tregimen    Plan  Patient Instructions  Zpack take as directed.  COVID 19 testing .  Hold at Prednisone 71mg 66mly until better then  Alternate Prednisone 71mg d68my and 2.71mg da27m.  Mucinex DM Twice daily  As needed   Continue on Zyrtec 10mg da34m Saline nasal rinses As needed   Continue on Symbicort 160 2 puffs twice daily.  Use DuoNeb nebulizer as needed Continue on Oxygen 3l/m  Follow up in 2 months with Dr. Byrum  ALamonte Sakai needed   Please contact office for sooner follow up if symptoms do not improve or worsen or seek emergency care       Follow Up Instructions: Follow up in 2 months and As needed   Please contact office for sooner follow up if symptoms do not improve or worsen or seek emergency care     I discussed the assessment and treatment plan with the patient. The patient was provided an opportunity to ask questions and all were answered. The patient agreed with the plan and demonstrated an understanding of the instructions.   The patient was advised to call back or seek an in-person evaluation if the symptoms worsen or if the condition fails to improve as anticipated.  I provided 24  minutes  of non-face-to-face time during this encounter.   Rexene Edison, NP

## 2018-12-25 NOTE — Patient Instructions (Addendum)
Zpack take as directed.  COVID 19 testing .  Hold at Prednisone 2m daily until better then  Alternate Prednisone 564mdaily and 2.28m38maily.  Mucinex DM Twice daily  As needed   Continue on Zyrtec 58m54mily  Saline nasal rinses As needed   Continue on Symbicort 160 2 puffs twice daily.  Use DuoNeb nebulizer as needed Continue on Oxygen 3l/m  Follow up in 2 months with Dr. ByruLamonte Sakaid As needed   Please contact office for sooner follow up if symptoms do not improve or worsen or seek emergency care

## 2018-12-25 NOTE — Telephone Encounter (Signed)
Called and spoke to patient. Patient stated that she had a televisit with Dr. [name] on 12/22/2018.  Patient stated that she is feeling a little better but did increase her prednisone on the day she usually takes 2.5m to 553m  Patient reports having a lot of sinus congestion and a productive cough. Patient stated that previously her mucus was clear but now it has turned yellow so she would like to have a televisit and talk about if she should get an antibiotic.  Scheduled patient for a telephone visit with NP.  Nothing further needed at this time.

## 2018-12-26 ENCOUNTER — Other Ambulatory Visit: Payer: Self-pay

## 2018-12-26 DIAGNOSIS — Z20822 Contact with and (suspected) exposure to covid-19: Secondary | ICD-10-CM

## 2018-12-28 LAB — NOVEL CORONAVIRUS, NAA: SARS-CoV-2, NAA: NOT DETECTED

## 2018-12-29 ENCOUNTER — Other Ambulatory Visit: Payer: Self-pay | Admitting: Adult Health

## 2018-12-29 MED ORDER — HYDROCODONE-HOMATROPINE 5-1.5 MG/5ML PO SYRP: 5.0000 mL | ORAL_SOLUTION | Freq: Every evening | ORAL | 0 refills | Status: DC | PRN

## 2018-12-29 MED ORDER — ESOMEPRAZOLE MAGNESIUM 40 MG PO CPDR: 40.0000 mg | DELAYED_RELEASE_CAPSULE | Freq: Every day | ORAL | 5 refills | Status: DC

## 2018-12-29 NOTE — Progress Notes (Signed)
See result note.  

## 2018-12-30 ENCOUNTER — Other Ambulatory Visit (HOSPITAL_COMMUNITY): Payer: Self-pay | Admitting: Cardiology

## 2018-12-30 NOTE — Telephone Encounter (Signed)
Called [employer] at [phone] to check the status or re-enrollment. Despite getting a confirmation they did not receive the information that was sent in. Said they have also not received the patient's portion. The representative is going to resend me the patient's portion and I will follow up with her on that and resend the provider portion.  Will follow up.  Charlann Boxer, CPhT

## 2019-01-01 NOTE — Telephone Encounter (Signed)
Called and spoke with patient. She is going to come and sign her portion of the paperwork. I have resent in the providers portion. Documentation is at the front check in desk for her. Will fax over once signed.  Charlann Boxer, CPhT

## 2019-01-05 ENCOUNTER — Other Ambulatory Visit: Payer: Self-pay | Admitting: Adult Health

## 2019-01-05 NOTE — Telephone Encounter (Signed)
Sent in patient's signed paperwork to [facility].  Fax #: [phone]  Phone #: [phone]  Will follow up.  Charlann Boxer, CPhT

## 2019-01-07 NOTE — Telephone Encounter (Signed)
Called [facility] to check the status of patient's application. The representative transferred me to [facility], I had to leave a voicemail.  Will follow up.  Charlann Boxer, CPhT

## 2019-01-12 NOTE — Telephone Encounter (Signed)
Patient has been approved to receive Adcirca and Tyvaso through [facility].  ID: [id number]  Effective Dates: 03/13/2019 through 03/11/2020  [facility] Phone #: [phone]  [facility] uses [facility] to distribute medications.   [facility] Phone #[phone]  Clarified that she was still active and these approvals are good through 2021.  Charlann Boxer, CPhT

## 2019-01-13 ENCOUNTER — Telehealth (HOSPITAL_COMMUNITY): Payer: Self-pay | Admitting: Pharmacist

## 2019-01-13 NOTE — Telephone Encounter (Addendum)
Received message from [employer] that patient has been approved for re-enrollment to receive Adcirca and Tyvaso from 03/12/2018-03/11/2020. However, after [facility] contacted [facility], it was discovered that she had not filled Tyvaso since 01/28/2018.   I reached out to the patient to determine if she was still taking Tyvaso as prescribed and where it was being filled. She stated that she had been continuing to receive the medication (although she was unable to tell me from which pharmacy) and that the last shipment was August 2020. She has enough medication to get through December 2020.   I spoke with Danielle over the phone at [facility]. She also spoke with Erin Zavala and it appears she has been receiving the medication. She will have [facility]'s contact at [facility] reach back out to discuss the case further. Of note, Tyvaso is only dispensed through [facility] and [facility]. [facility] fills her [facility] but has no record of Tyvaso. Will continue to follow.   [facility] #: [phone]  Audry Riles, PharmD, BCPS, CPP Heart Failure Clinic Pharmacist [phone]

## 2019-01-15 ENCOUNTER — Other Ambulatory Visit (HOSPITAL_COMMUNITY): Payer: Self-pay | Admitting: Cardiology

## 2019-01-15 ENCOUNTER — Other Ambulatory Visit: Payer: Self-pay | Admitting: Emergency Medicine

## 2019-01-20 ENCOUNTER — Other Ambulatory Visit (HOSPITAL_COMMUNITY): Payer: Self-pay

## 2019-01-20 MED ORDER — ADCIRCA 20 MG PO TABS: ORAL_TABLET | ORAL | 10 refills | Status: DC

## 2019-01-21 ENCOUNTER — Encounter (HOSPITAL_COMMUNITY): Payer: Self-pay | Admitting: Cardiology

## 2019-01-23 ENCOUNTER — Ambulatory Visit (INDEPENDENT_AMBULATORY_CARE_PROVIDER_SITE_OTHER): Payer: Self-pay | Admitting: Adult Health

## 2019-01-23 ENCOUNTER — Encounter: Payer: Self-pay | Admitting: Adult Health

## 2019-01-23 DIAGNOSIS — J9611 Chronic respiratory failure with hypoxia: Secondary | ICD-10-CM

## 2019-01-23 DIAGNOSIS — J449 Chronic obstructive pulmonary disease, unspecified: Secondary | ICD-10-CM

## 2019-01-23 DIAGNOSIS — J849 Interstitial pulmonary disease, unspecified: Secondary | ICD-10-CM

## 2019-01-23 DIAGNOSIS — I272 Pulmonary hypertension, unspecified: Secondary | ICD-10-CM

## 2019-01-23 DIAGNOSIS — D869 Sarcoidosis, unspecified: Secondary | ICD-10-CM

## 2019-01-23 MED ORDER — PREDNISONE 10 MG PO TABS: ORAL_TABLET | ORAL | 1 refills | Status: DC

## 2019-01-23 NOTE — Telephone Encounter (Signed)
Followed up with Elta Guadeloupe, Pharmacy Manager at [facility], regarding Ms. Province's Tyvaso. He was unable to find any record of shipment from [place] since 01/29/19. She did send in a picture of the shipping label [facility] last sent her Tyvaso in and that label was from 2019 (she stated she last received the medication in August 2020).   At this time, I am unable to confirm that the patient has been taking her Tyvaso as prescribed over the last year. She stated she was taking the Tyvaso, 9 breaths 4 times daily, and only ever noted non-compliance in January when she had pneumonia.   [facility] has begun to ship her Tyvaso to her, starting in September 2020. Her last shipment was 01/16/2019. They have also reached out to the nurse who instructed her on how to use the device to make sure the patient is taking the medication correctly. She is still taking Tyvaso 9 breaths 4 times daily. Her next office visit is 03/16/2019. At that time, compliance and technique should be reinforced.   Audry Riles, PharmD, BCPS, CPP Heart Failure Clinic Pharmacist [phone]

## 2019-01-23 NOTE — Patient Instructions (Signed)
Increase Prednisone 69m daily for 1 week then 548mdaily -  Hold at this dose.  Mucinex DM Twice daily  As needed   Continue on Zyrtec 1056maily  Saline nasal rinses As needed   Continue on Symbicort 160 2 puffs twice daily.  Use DuoNeb nebulizer as needed Continue on Oxygen 3l/m  Follow up in 2 weeks with Dr. ByrLamonte Sakai Par[place] NP with chest xray /laReva Bores Please contact office for sooner follow up if symptoms do not improve or worsen or seek emergency care

## 2019-01-23 NOTE — Progress Notes (Signed)
Virtual Visit via Telephone Note  I connected with Erin Zavala on 01/23/19 at 11:30 AM EST by telephone and verified that I am speaking with the correct person using two identifiers.  Location: Patient: Home  Provider: Office    I discussed the limitations, risks, security and privacy concerns of performing an evaluation and management service by telephone and the availability of in person appointments. I also discussed with the patient that there may be a patient responsible charge related to this service. The patient expressed understanding and agreed to proceed.   History of Present Illness: 53 year old female former smoker followed for sarcoidosis with associated ILD and ocular  involvement and COPD.  She has multiple medical problems complicated by severe pulmonary hypertension and chronic respiratory failure on oxygen.  Today's televisit is an acute office visit.  As above patient has sarcoidosis with ILD, severe pulmonary hypertension and oxygen dependent respiratory failure.  She is on oxygen 3 L.  She maintains on Symbicort, prednisone, Adcirca, Letaris, and Tyvaso.  Patient was seen 1 month ago for an acute bronchitis.  She was treated with a Z-Pak and prednisone burst.  COVID-19 testing was negative.  Patient says she she improved and returned back to baseline.  In September patient was doing well.  She had her chronic steroids decreased from 5 mg to 2.5 mg.  She says shortly after this she said her breathing did not do as well and then after her visit last month prednisone was increased briefly back to 5 mg.  But she says since going back down to 2.5 mg she says starting to see that her breathing is not as good.  She is more short of breath has some more cough and wheezing.  She denies any fever, discolored mucus, chest pain, orthopnea or increased leg swelling.  She is currently on prednisone 2.5 mg.  She says her oxygen levels have been good.  She is had no increased oxygen demands.   Remains on 3 L.   Observations/Objective: Patient speaks in full sentences without obvious audible distress.   High-resolution CT chest March 2019 showed mediastinal hilar and pulmonary parenchymal fibrotic changes of sarcoid  2D echo May 2019 showed EF 60 to 65%, pulmonary artery pressure 37 mmHg (significant improvement from previous pulmonary pressure at 60 mmHg)  PFT April 2019 showed FEV1 74%, ratio 87, FVC 68%, no significant bronchodilator response. Positive mid flow reversibility. DLCO 34%.  [facility] evaluation for transplant eval ,right and left heart cath. Showed 80% LAD stenosis. She was treated with DES.Right heart cath showed mild to moderate pulmonary hypertension.  HRCT chest 3/2020showed stable chronic changes with diffuse groundglass opacities, moderate pulmonary fibrosis and traction bronchiectasis. No significant change since March 2019, notable progression since August 2017.    Assessment and Plan: Sarcoid and COPD with mild flare-for now we will give a short course of steroids.  Hold on antibiotics this time.  Patient will have close follow-up in the office with a chest x-ray and labs.  Chronic respiratory failure stable on oxygen with no increased oxygen demands.  Continue on O2 at 3 L.  To keep O2 saturation greater than 88 to 90%.  Pulmonary hypertension.  Continue on current regimen.  On follow-up in 2 weeks we will get lab work.  Plan  Patient Instructions  Increase Prednisone 66m daily for 1 week then 572mdaily -  Hold at this dose.  Mucinex DM Twice daily  As needed   Continue on Zyrtec 1070maily  Saline  nasal rinses As needed   Continue on Symbicort 160 2 puffs twice daily.  Use DuoNeb nebulizer as needed Continue on Oxygen 3l/m  Follow up in 2 weeks with Dr. [name] or Denecia Brunette NP with chest xray Reva Bores .  Please contact office for sooner follow up if symptoms do not improve or worsen or seek emergency care       Follow Up  Instructions: Follow-up in 2 weeks and as needed  Please contact office for sooner follow up if symptoms do not improve or worsen or seek emergency care     I discussed the assessment and treatment plan with the patient. The patient was provided an opportunity to ask questions and all were answered. The patient agreed with the plan and demonstrated an understanding of the instructions.   The patient was advised to call back or seek an in-person evaluation if the symptoms worsen or if the condition fails to improve as anticipated.  I provided 33 minutes of non-face-to-face time during this encounter.   Rexene Edison, NP

## 2019-02-06 ENCOUNTER — Other Ambulatory Visit: Payer: Self-pay

## 2019-02-06 DIAGNOSIS — J449 Chronic obstructive pulmonary disease, unspecified: Secondary | ICD-10-CM

## 2019-02-09 ENCOUNTER — Encounter: Payer: Self-pay | Admitting: Adult Health

## 2019-02-09 ENCOUNTER — Other Ambulatory Visit: Payer: Self-pay

## 2019-02-09 ENCOUNTER — Ambulatory Visit (INDEPENDENT_AMBULATORY_CARE_PROVIDER_SITE_OTHER): Payer: Medicare Other | Admitting: Adult Health

## 2019-02-09 ENCOUNTER — Ambulatory Visit (INDEPENDENT_AMBULATORY_CARE_PROVIDER_SITE_OTHER): Payer: Medicare Other

## 2019-02-09 DIAGNOSIS — J9611 Chronic respiratory failure with hypoxia: Secondary | ICD-10-CM

## 2019-02-09 DIAGNOSIS — D860 Sarcoidosis of lung: Secondary | ICD-10-CM

## 2019-02-09 DIAGNOSIS — I2510 Atherosclerotic heart disease of native coronary artery without angina pectoris: Secondary | ICD-10-CM

## 2019-02-09 DIAGNOSIS — I272 Pulmonary hypertension, unspecified: Secondary | ICD-10-CM

## 2019-02-09 DIAGNOSIS — J449 Chronic obstructive pulmonary disease, unspecified: Secondary | ICD-10-CM | POA: Diagnosis not present

## 2019-02-09 MED ORDER — PREDNISONE 5 MG PO TABS: ORAL_TABLET | ORAL | 2 refills | Status: DC

## 2019-02-09 NOTE — Patient Instructions (Addendum)
Decrease Prednisone 47m alternating 583mdaily for 1 month then 24m624maily , hold at this dose.  Mucinex DM Twice daily  As needed   Continue on Zyrtec 31m79mily  Saline nasal rinses As needed   Continue on Symbicort 160 2 puffs twice daily.  Use DuoNeb nebulizer as needed Continue on Oxygen 3l/m  Follow up with Primary Provider for routine labs and bone health.,  Follow up in 2 months with Dr. ByruLamonte SakaiParrett NP.   Please contact office for sooner follow up if symptoms do not improve or worsen or seek emergency care

## 2019-02-09 NOTE — Progress Notes (Signed)
_0  ID: Erin Zavala, female    DOB: 03-19-1965, 53 y.o.   MRN: [id number]  Chief Complaint  Patient presents with  . Follow-up    COPD     Referring provider: No ref. provider found  HPI: 53 year old female former smoker followed for sarcoidosis with associated ILD, ocular involvement.  Patient has underlying COPD.  She has multiple medical problems complicated by severe pulmonary hypertension and chronic respiratory failure on oxygen  TEST/EVENTS :   High-resolution CT chest March 2019 showed mediastinal hilar and pulmonary parenchymal fibrotic changes of sarcoid  2D echo May 2019 showed EF 60 to 65%, pulmonary artery pressure 37 mmHg (significant improvement from previous pulmonary pressure at 60 mmHg)  PFT April 2019 showed FEV1 74%, ratio 87, FVC 68%, no significant bronchodilator response. Positive mid flow reversibility. DLCO 34%.  [facility] evaluation for transplant eval ,right and left heart cath. Showed 80% LAD stenosis. She was treated with DES.Right heart cath showed mild to moderate pulmonary hypertension.  HRCT chest 3/2020showed stable chronic changes with diffuse groundglass opacities, moderate pulmonary fibrosis and traction bronchiectasis. No significant change since March 2019, notable progression since August 2017.  02/09/2019 Follow up : COPD , Sarcoid w/ ILD , O2 RF , Pulmonary HTN  Patient returns for a 2-week follow-up.  Patient has underlying sarcoidosis with ILD, severe pulmonary hypertension and oxygen dependent respiratory failure.  She is maintained on Symbicort, chronic steroids with prednisone, Adcirca, Latera's and Tyvaso.  She was seen last visit with a slow to resolve bronchitis.  She was initially treated with a Z-Pak and a prednisone burst.  2 weeks ago she was recommended to increase her prednisone to 10 mg and hold at this dose.  Patient has been attempted to lower her prednisone doses several times.  According to patient each time if  she goes below 5 mg she has a flare of her symptoms.  She says she is doing better since last visit.  She is returned back to her baseline.  She has less coughing and shortness of breath.  Patient gets winded with heavy activity.  She does continue to work full-time.  Unfortunately with the pandemic she is in a high exposure work environment at the [employer].  Patient says she has had multiple coworkers with COVID-19. Patient remains on 3 L of oxygen.  Says she has had no increased oxygen demands.  Appetite is good with no nausea vomiting diarrhea. We discussed that she has had slow weight gain over the last few years.  We discussed healthy diet low salt and carbohydrates.  Encouraged on bone health and weightbearing exercises. Patient education on potential complications of chronic steroids. Chest x-ray today shows chronic changes with no acute process.  No Known Allergies  Immunization History  Administered Date(s) Administered  . Influenza,inj,Quad PF,6+ Mos 02/28/2016, 12/18/2016, 01/02/2018, 11/04/2018  . PPD Test 02/28/2016  . Pneumococcal Conjugate-13 11/29/2016  . Pneumococcal Polysaccharide-23 11/03/2015    Past Medical History:  Diagnosis Date  . Asthma    as a child  . Cough   . Dyspnea   . GERD (gastroesophageal reflux disease)   . Migraines   . Oxygen dependent   . Pulmonary hypertension ([place])   . Sarcoidosis     Tobacco History: Social History   Tobacco Use  Smoking Status Former Smoker  . Packs/day: 0.25  . Years: 10.00  . Pack years: 2.50  . Types: Cigarettes  . Quit date: 01/11/2011  . Years since quitting: 8.0  Smokeless  Tobacco Never Used   Counseling given: Not Answered   Outpatient Medications Prior to Visit  Medication Sig Dispense Refill  . ADCIRCA 20 MG tablet TAKE 2 TABLETS BY MOUTH ONCE DAILY. 60 tablet 10  . albuterol (PROVENTIL HFA;VENTOLIN HFA) 108 (90 Base) MCG/ACT inhaler Inhale 2 puffs into the lungs every 4 (four) hours as needed for  wheezing or shortness of breath. 1 Inhaler 5  . aspirin 81 MG tablet Take 1 tablet (81 mg total) by mouth daily. 30 tablet 0  . atorvastatin (LIPITOR) 40 MG tablet TAKE 1 TABLET BY MOUTH EVERY DAY 90 tablet 1  . budesonide-formoterol (SYMBICORT) 160-4.5 MCG/ACT inhaler Inhale 2 puffs into the lungs 2 (two) times daily. 2 Inhaler 0  . cetirizine (ZYRTEC) 10 MG tablet TAKE 1 TABLET BY MOUTH EVERY DAY 90 tablet 1  . esomeprazole (NEXIUM) 40 MG capsule Take 1 capsule (40 mg total) by mouth daily. 30 capsule 5  . ferrous sulfate 325 (65 FE) MG tablet TAKE 1 TABLET (325 MG TOTAL) BY MOUTH 2 (TWO) TIMES DAILY WITH A MEAL. 60 tablet 5  . fluticasone (FLONASE) 50 MCG/ACT nasal spray Place 2 sprays into both nostrils daily.    [name] guaiFENesin (MUCINEX) 600 MG 12 hr tablet Take 600 mg by mouth 2 (two) times daily as needed.     [name] HYDROcodone-homatropine (HYDROMET) 5-1.5 MG/5ML syrup Take 5 mLs by mouth at bedtime as needed. 120 mL 0  . ipratropium-albuterol (DUONEB) 0.5-2.5 (3) MG/3ML SOLN Take 3 mLs by nebulization every 6 (six) hours as needed. 360 mL 11  . LETAIRIS 10 MG tablet TAKE 1 TABLET BY MOUTH ONCE DAILY 30 tablet 10  . OXYGEN 2-3 lpm 24/7  [facility]    . Treprostinil (TYVASO) 0.6 MG/ML SOLN Inhale 18 mcg into the lungs 4 (four) times daily.    . predniSONE (DELTASONE) 10 MG tablet Take 1/2-1 daily as directed. 30 tablet 1  . predniSONE (DELTASONE) 5 MG tablet TAKE 1 TABLET BY MOUTH DAILY, ALTERNATING WITH 1/2 TABLET BY MOUTH (1,1/2,1,1/2.Marland KitchenETC) 15 tablet 3   No facility-administered medications prior to visit.      Review of Systems:   Constitutional:   No  weight loss, night sweats,  Fevers, chills,  +fatigue, or  lassitude.  HEENT:   No headaches,  Difficulty swallowing,  Tooth/dental problems, or  Sore throat,                No sneezing, itching, ear ache, nasal congestion, post nasal drip,   CV:  No chest pain,  Orthopnea, PND, swelling in lower extremities, anasarca, dizziness,  palpitations, syncope.   GI  No heartburn, indigestion, abdominal pain, nausea, vomiting, diarrhea, change in bowel habits, loss of appetite, bloody stools.   Resp:    No chest wall deformity  Skin: no rash or lesions.  GU: no dysuria, change in color of urine, no urgency or frequency.  No flank pain, no hematuria   MS:  No joint pain or swelling.  No decreased range of motion.  No back pain.    Physical Exam  BP 102/68 (BP Location: Left Arm, Cuff Size: Normal)   Pulse 78   Temp (!) 97.2 F (36.2 C) (Temporal)   Ht 5' (1.524 m)   Wt 158 lb (71.7 kg)   LMP 01/29/2019   SpO2 96% Comment: room air  BMI 30.86 kg/m   GEN: A/Ox3; pleasant , NAD    HEENT:  [place]/AT,   NOSE-clear, THROAT-clear, no lesions, no postnasal drip  or exudate noted.   NECK:  Supple w/ fair ROM; no JVD; normal carotid impulses w/o bruits; no thyromegaly or nodules palpated; no lymphadenopathy.    RESP faint bibasilar crackles no  accessory muscle use, no dullness to percussion  CARD:  RRR, no m/r/g, no peripheral edema, pulses intact, no cyanosis or clubbing.  GI:   Soft & nt; nml bowel sounds; no organomegaly or masses detected.   Musco: Warm bil, no deformities or joint swelling noted.   Neuro: alert, no focal deficits noted.    Skin: Warm, no lesions or rashes    Lab Results:  CBC  BNP    Component Value Date/Time   BNP 52.8 10/20/2018 1201    ProBNP No results found for: PROBNP  Imaging: Dg Chest 2 View  Result Date: 02/09/2019 CLINICAL DATA:  COPD follow-up. EXAM: CHEST - 2 VIEW COMPARISON:  None. FINDINGS: The heart size is normal. Chronic interstitial prominence is similar the prior studies. No superimposed disease is present. No significant effusion is present. Visualized soft tissues and bony thorax are unremarkable. IMPRESSION: 1. Stable chronic interstitial prominence and fibrotic disease. 2. No acute cardiopulmonary disease or significant interval change. Electronically Signed    By: San Morelle M.D.   On: 02/09/2019 09:56      PFT Results Latest Ref Rng & Units 06/26/2017 11/28/2016 08/23/2016 12/27/2015  FVC-Pre L 1.61 1.69 1.70 1.53  FVC-Predicted Pre % 66 68 69 61  FVC-Post L 1.67 - - 1.56  FVC-Predicted Post % 68 - - 63  Pre FEV1/FVC % % 84 87 87 79  Post FEV1/FCV % % 87 - - 74  FEV1-Pre L 1.35 1.46 1.49 1.21  FEV1-Predicted Pre % 69 74 75 60  FEV1-Post L 1.46 - - 1.16  DLCO UNC% % 34 _0 DLCO COR %Predicted % 59 51 54 47  TLC L 2.80 - - 2.62  TLC % Predicted % 62 - - 58  RV % Predicted % 78 - - 70    No results found for: NITRICOXIDE      Assessment & Plan:   Pulmonary sarcoidosis (HCC) Recent slow to resolve exacerbation with bronchitis.  Patient has associated ILD. Seems to flare when she goes below prednisone 5 mg.  For now we will place her on a slow taper with 10 mg alternating with 5 mg for 1 month and then hold at 5 mg daily dosing.  This may need to be her new baseline.  Patient education on chronic steroid potential complications.  Have encouraged her discuss routine labs and bone density with her primary care provider  Plan  Patient Instructions  Decrease Prednisone 46m alternating 533mdaily for 1 month then 27m22maily , hold at this dose.  Mucinex DM Twice daily  As needed   Continue on Zyrtec 52m43mily  Saline nasal rinses As needed   Continue on Symbicort 160 2 puffs twice daily.  Use DuoNeb nebulizer as needed Continue on Oxygen 3l/m  Follow up with Primary Provider for routine labs and bone health.,  Follow up in 2 months with Dr. ByruLamonte SakaiParrett NP.   Please contact office for sooner follow up if symptoms do not improve or worsen or seek emergency care       Pulmonary hypertension (HCC)Coxtonntinue on current regimen.  Appears to be stable.  Follow-up with cardiology as planned.  Chronic respiratory failure with hypoxia (HCC) Currently compensated on current oxygen settings.  O2 saturation goals  greater  than 88 to 90%.  Plan  Patient Instructions  Decrease Prednisone 41m alternating 528mdaily for 1 month then 63m82maily , hold at this dose.  Mucinex DM Twice daily  As needed   Continue on Zyrtec 35m64mily  Saline nasal rinses As needed   Continue on Symbicort 160 2 puffs twice daily.  Use DuoNeb nebulizer as needed Continue on Oxygen 3l/m  Follow up with Primary Provider for routine labs and bone health.,  Follow up in 2 months with Dr. ByruLamonte SakaiParrett NP.   Please contact office for sooner follow up if symptoms do not improve or worsen or seek emergency care          TammRexene Edison 02/09/2019

## 2019-02-09 NOTE — Assessment & Plan Note (Signed)
Continue on current regimen.  Appears to be stable.  Follow-up with cardiology as planned.

## 2019-02-09 NOTE — Assessment & Plan Note (Signed)
Currently compensated on current oxygen settings.  O2 saturation goals greater than 88 to 90%.  Plan  Patient Instructions  Decrease Prednisone 63m alternating 5549mdaily for 1 month then 49m19maily , hold at this dose.  Mucinex DM Twice daily  As needed   Continue on Zyrtec 58m62mily  Saline nasal rinses As needed   Continue on Symbicort 160 2 puffs twice daily.  Use DuoNeb nebulizer as needed Continue on Oxygen 3l/m  Follow up with Primary Provider for routine labs and bone health.,  Follow up in 2 months with Dr. ByruLamonte SakaiParrett NP.   Please contact office for sooner follow up if symptoms do not improve or worsen or seek emergency care

## 2019-02-09 NOTE — Assessment & Plan Note (Signed)
Recent slow to resolve exacerbation with bronchitis.  Patient has associated ILD. Seems to flare when she goes below prednisone 5 mg.  For now we will place her on a slow taper with 10 mg alternating with 5 mg for 1 month and then hold at 5 mg daily dosing.  This may need to be her new baseline.  Patient education on chronic steroid potential complications.  Have encouraged her discuss routine labs and bone density with her primary care provider  Plan  Patient Instructions  Decrease Prednisone 28m alternating 547mdaily for 1 month then 26m526maily , hold at this dose.  Mucinex DM Twice daily  As needed   Continue on Zyrtec 46m31mily  Saline nasal rinses As needed   Continue on Symbicort 160 2 puffs twice daily.  Use DuoNeb nebulizer as needed Continue on Oxygen 3l/m  Follow up with Primary Provider for routine labs and bone health.,  Follow up in 2 months with Dr. ByruLamonte SakaiParrett NP.   Please contact office for sooner follow up if symptoms do not improve or worsen or seek emergency care

## 2019-02-17 ENCOUNTER — Ambulatory Visit: Payer: Self-pay | Admitting: Emergency Medicine

## 2019-02-19 ENCOUNTER — Other Ambulatory Visit: Payer: Self-pay

## 2019-02-19 ENCOUNTER — Encounter: Payer: Self-pay | Admitting: Emergency Medicine

## 2019-02-19 ENCOUNTER — Ambulatory Visit (INDEPENDENT_AMBULATORY_CARE_PROVIDER_SITE_OTHER): Payer: Medicare Other | Admitting: Emergency Medicine

## 2019-02-19 DIAGNOSIS — D860 Sarcoidosis of lung: Secondary | ICD-10-CM

## 2019-02-19 MED ORDER — PREDNISONE 5 MG PO TABS: 2.5000 mg | ORAL_TABLET | Freq: Every day | ORAL | 2 refills | Status: DC

## 2019-02-19 NOTE — Progress Notes (Signed)
Virtual Visit via Telephone Note  I connected with Erin Zavala on 02/19/19 at  9:45 AM EST by telephone and verified that I am speaking with the correct person using two identifiers.  Location: Patient: Home Provider: Office   I discussed the limitations, risks, security and privacy concerns of performing an evaluation and management service by telephone and the availability of in person appointments. I also discussed with the patient that there may be a patient responsible charge related to this service. The patient expressed understanding and agreed to proceed.   History of Present Illness: 53 year old woman with sarcoidosis (COPD, ocular involvement) and associated interstitial lung disease by CT scan of the chest, secondary pulmonary hypertension that has been treated with Erin Zavala, Lateris, Tyvaso.  I have been working on trying to decrease her scheduled prednisone dose.  She had difficulty decreasing to 2.5 mg daily, then had an acute flare in mid October that was treated with a prednisone taper.  She was tapered back down to 5 mg daily and remains at that dose currently.  Oxygen at 3 L/min.   Observations/Objective: She feels quite well. She is not working at [employer] right now due to [place] risk. She notes that she was able to decrease pred to 2.47m in the past  On symbicort, tolerates well Remains on same GERD regimen, allergy regimen Notes that she was sent extra tyvaso for some reason - 90 day supplies. Then she had trouble renewing the medication, there was some concern that the meds she received were out of date. She is now getting the med as prescribed.    Assessment and Plan: Plan to repeat CT chest March 2021 to follow interstitial lung disease associated with sarcoidosis Continue prednisone, dose to 2.5 alternated 541m if tolerated then go to 2.52m16maily and stay at that dose.  Continue AdcAaron Moseyvaso and continue f/u with Cardiology.  Flu shot and PNA shot up  to date/  We talked about the upcoming C19 vaccine today - she is a candidate to have it early  Follow Up Instructions: 3 months OV    I discussed the assessment and treatment plan with the patient. The patient was provided an opportunity to ask questions and all were answered. The patient agreed with the plan and demonstrated an understanding of the instructions.   The patient was advised to call back or seek an in-person evaluation if the symptoms worsen or if the condition fails to improve as anticipated.  I provided 15 minutes of non-face-to-face time during this encounter.   Erin Zavala

## 2019-03-16 ENCOUNTER — Other Ambulatory Visit: Payer: Self-pay

## 2019-03-16 ENCOUNTER — Encounter (HOSPITAL_COMMUNITY): Payer: Self-pay | Admitting: Cardiology

## 2019-03-16 ENCOUNTER — Ambulatory Visit (HOSPITAL_COMMUNITY)
Admission: RE | Admit: 2019-03-16 | Discharge: 2019-03-16 | Disposition: A | Discharge status: Home / Self Care | Payer: Medicare Other | Source: Ambulatory Visit | Attending: Cardiology | Admitting: Cardiology

## 2019-03-16 VITALS — BP 130/78 | HR 97 | Wt 164.0 lb

## 2019-03-16 DIAGNOSIS — Z7951 Long term (current) use of inhaled steroids: Secondary | ICD-10-CM | POA: Insufficient documentation

## 2019-03-16 DIAGNOSIS — Z955 Presence of coronary angioplasty implant and graft: Secondary | ICD-10-CM | POA: Insufficient documentation

## 2019-03-16 DIAGNOSIS — Z7982 Long term (current) use of aspirin: Secondary | ICD-10-CM | POA: Diagnosis not present

## 2019-03-16 DIAGNOSIS — Z7952 Long term (current) use of systemic steroids: Secondary | ICD-10-CM | POA: Insufficient documentation

## 2019-03-16 DIAGNOSIS — Z87891 Personal history of nicotine dependence: Secondary | ICD-10-CM | POA: Diagnosis not present

## 2019-03-16 DIAGNOSIS — Z79899 Other long term (current) drug therapy: Secondary | ICD-10-CM | POA: Insufficient documentation

## 2019-03-16 DIAGNOSIS — I2721 Secondary pulmonary arterial hypertension: Secondary | ICD-10-CM | POA: Insufficient documentation

## 2019-03-16 DIAGNOSIS — J9611 Chronic respiratory failure with hypoxia: Secondary | ICD-10-CM | POA: Insufficient documentation

## 2019-03-16 DIAGNOSIS — I2510 Atherosclerotic heart disease of native coronary artery without angina pectoris: Secondary | ICD-10-CM | POA: Insufficient documentation

## 2019-03-16 DIAGNOSIS — D8689 Sarcoidosis of other sites: Secondary | ICD-10-CM | POA: Diagnosis not present

## 2019-03-16 DIAGNOSIS — K219 Gastro-esophageal reflux disease without esophagitis: Secondary | ICD-10-CM | POA: Insufficient documentation

## 2019-03-16 DIAGNOSIS — Z9981 Dependence on supplemental oxygen: Secondary | ICD-10-CM | POA: Diagnosis not present

## 2019-03-16 DIAGNOSIS — I272 Pulmonary hypertension, unspecified: Secondary | ICD-10-CM

## 2019-03-16 DIAGNOSIS — D860 Sarcoidosis of lung: Secondary | ICD-10-CM | POA: Diagnosis not present

## 2019-03-16 DIAGNOSIS — Z8249 Family history of ischemic heart disease and other diseases of the circulatory system: Secondary | ICD-10-CM | POA: Insufficient documentation

## 2019-03-16 LAB — BASIC METABOLIC PANEL
Anion gap: 9 (ref 5–15)
BUN: 9 mg/dL (ref 6–20)
CO2: 27 mmol/L (ref 22–32)
Calcium: 8.7 mg/dL — ABNORMAL LOW (ref 8.9–10.3)
Chloride: 103 mmol/L (ref 98–111)
Creatinine, Ser: 0.93 mg/dL (ref 0.44–1.00)
GFR calc Af Amer: >60 mL/min (ref >60)
GFR calc non Af Amer: >60 mL/min (ref >60)
Glucose, Bld: 84 mg/dL (ref 70–99)
Potassium: 3.9 mmol/L (ref 3.5–5.1)
Sodium: 139 mmol/L (ref 135–145)

## 2019-03-16 LAB — BRAIN NATRIURETIC PEPTIDE: B Natriuretic Peptide: 51.5 pg/mL (ref 0.0–100.0)

## 2019-03-16 LAB — LIPID PANEL
Cholesterol: 204 mg/dL — ABNORMAL HIGH (ref 0–200)
HDL: 62 mg/dL (ref >40)
LDL Cholesterol: 117 mg/dL — ABNORMAL HIGH (ref 0–99)
Total CHOL/HDL Ratio: 3.3 RATIO
Triglycerides: 126 mg/dL (ref <150)
VLDL: 25 mg/dL (ref 0–40)

## 2019-03-16 NOTE — Progress Notes (Signed)
6 minute walk: pt ambulated 289 meters.  Pt reported some fatigue and sob.  HR 86-129 O2 94-99% on 4L [facility]. Pt able to complete walk

## 2019-03-16 NOTE — Patient Instructions (Signed)
Labs today We will only contact you if something comes back abnormal or we need to make some changes. Otherwise no news is good news!  Your physician recommends that you schedule a follow-up appointment in: 3 months with Dr [name]  Please call office at [phone] option 2 if you have any questions or concerns.   At the [facility], you and your health needs are our priority. As part of our continuing mission to provide you with exceptional heart care, we have created designated Provider Care Teams. These Care Teams include your primary Cardiologist (physician) and Advanced Practice Providers (APPs- Physician Assistants and Nurse Practitioners) who all work together to provide you with the care you need, when you need it.   You may see any of the following providers on your designated Care Team at your next follow up: [name] Dr Glori Bickers . Dr Loralie Champagne . Darrick Grinder, NP . Lyda Jester, PA . Audry Riles, PharmD   Please be sure to bring in all your medications bottles to every appointment.

## 2019-03-16 NOTE — Progress Notes (Signed)
Date:  03/16/2019   ID:  Erin Zavala, DOB [birth year], MRN [id number]  Provider location: [facility] Type of Visit: Established patient  PCP:  Patient, No Pcp Per  Cardiologist: Dr. [name]   History of Present Illness: Erin Zavala is a 54 y.o. female who has history of sarcoidosis, chronic hypoxemic respiratory failure on home oxygen, and pulmonary hypertension.  She was diagnosed with ocular sarcoidosis in 1997. It seems like she did not have significant lung complications (that were recognized at least) until early 2017.  She now requires home oxygen.  CTA chest shows changes consistent with sarcoidosis, this has been confirmed by biopsy.  Echo in 8/17 had evidence for RV strain and elevated PA pressure.  [place] in 10/17 confirmed pulmonary arterial hypertension with normal right and left heart filling pressures.    She has started on macitentan followed by Adcirca and most recently Tyvaso.  She did not tolerate selexipag well. She feels like these medications have helped her breathing.    She went to [facility] for transplant evaluation in 4/18.  As part of that evaluation, she had right and left heart cath.  Surprisingly, given age and lack of family history, she had an 80% LAD stenosis.  This was treated with DES.  RHC showed mild to moderate pulmonary hypertension. Transplant evaluation is currently on hold as she has been doing better symptomatically.   Echo in 8/20 showed EF 55-60%, normal RV, PASP 40 mmHg.   She returns for followup of pulmonary hypertension.  She is using 1 L oxygen by [place] at rest, 3 L with exertion.  She has been having more dyspnea flares this year and is taking prednisone 10 alternating with 5.  She has had close followup with pulmonary.  Mild dyspnea walking a long distance on flat ground, short of breath with stairs.  No orthopnea/PND.  No chest pain.  No lightheadedness.  6 minute walk today was less than prior.  Weight up about 10  lbs.  Labs (9/17): NA negative, ESR 112, anti-dsDNA negative, anti-CCP negative, RF < 14.  ACE 136.  Labs (10/17): K 4.1, creatinine 0.8 Labs (11/17): K 3.8, creatinine 0.91 Labs (3/18): BNP 16 Labs (5/18): K 4.6, creatinine 0.74 Labs (7/18): creatinine 0.79 Labs (9/18): K 4.3, creatinine 0.76 Labs (10/18): LDL 62, HDL 40 Labs (12/19): K 3.6, creatinine 0.73 Labs (1/20): BNP 17.7 Labs (3/20): LDL 67, HDL 65, TGs 185  Labs (8/20): BNP 53  6 minute walk (10/17): 305 m.  6 minute walk (1/18): 305 m 6 minute walk (3/18): 270 m 6 minute walk (6/18): 302 m 6 minute walk (9/18): 396 m 6 minute walk (2/19): 213 m (could have kept going but was stopped due to decreased oxygen saturation).  6 minute walk (4/19): 396 m 6 minute walk (7/19): 381 m 6 minute walk (8/20) 359 m 6 minute walk (1/21): 24 m  PMH: 1. GERD 2. Pulmonary hypertension: Suspect Group 5 PH related to sarcoidosis.  - CTA chest (8/17) with no PE but mediastinal/hilar LAN, cystic lung changes, subpleural consolidation, patchy ground glass.  - Echo (8/17): EF 55-60%, D-shaped interventricular septum, PASP 60 mmHg. - ANA negative, ESR 112, anti-dsDNA negative, anti-CCP negative, RF < 14.  - RHC (10/17): mean RA 3, PA 59/22 mean 36, mean PCWP 5, CI 2.92, PVR 7.4 WU.  - PFTs (10/17): Restrictive.  - RHC (4/18): mean RA 4, PA mean 32, mean PCWP 8, CI 2.9, PVR 5.2 WU.  -  Did not tolerate selexipag.  - Echo (5/19): EF 60-65%, mild LVH, mild MR, RV normal size and systolic function, PASP 37 mmHg.  - Echo (8/20): EF 55-60%, normal RV size and systolic function, PASP 40 mmHg, IVC normal 3. Sarcoidosis: Diagnosed with ocular sarcoidosis in 1997. Significant pulmonary involvement noted in 8/17.  Lung biopsy positive.  - Cardiac MRI (5/18): EF 66%, normal RV size with low normal RV systolic function (EF 09%), no LGE (no evidence for cardiac sarcoidosis).  - CT chest 3/19 with stable pulmonary fibrotic changes related to  sarcoidosis.  - PFTs (4/19): FVC 66%, FEV1 69%, ratio 103%, DLCO 37%, TLC 62% => moderate -severe mixed obstruction/restriction.  4. Chronic hypoxemic respiratory failure: Home oxygen 5. Childhood asthma. 6. CAD: LHC (4/18) with 80% LAD stenosis => DES to LAD.   Current Outpatient Medications  Medication Sig Dispense Refill  . ADCIRCA 20 MG tablet TAKE 2 TABLETS BY MOUTH ONCE DAILY. 60 tablet 10  . albuterol (PROVENTIL HFA;VENTOLIN HFA) 108 (90 Base) MCG/ACT inhaler Inhale 2 puffs into the lungs every 4 (four) hours as needed for wheezing or shortness of breath. 1 Inhaler 5  . aspirin 81 MG tablet Take 1 tablet (81 mg total) by mouth daily. 30 tablet 0  . atorvastatin (LIPITOR) 40 MG tablet TAKE 1 TABLET BY MOUTH EVERY DAY 90 tablet 1  . budesonide-formoterol (SYMBICORT) 160-4.5 MCG/ACT inhaler Inhale 2 puffs into the lungs 2 (two) times daily. 2 Inhaler 0  . cetirizine (ZYRTEC) 10 MG tablet TAKE 1 TABLET BY MOUTH EVERY DAY 90 tablet 1  . esomeprazole (NEXIUM) 40 MG capsule Take 1 capsule (40 mg total) by mouth daily. 30 capsule 5  . ferrous sulfate 325 (65 FE) MG tablet TAKE 1 TABLET (325 MG TOTAL) BY MOUTH 2 (TWO) TIMES DAILY WITH A MEAL. 60 tablet 5  . fluticasone (FLONASE) 50 MCG/ACT nasal spray Place 2 sprays into both nostrils daily.    [name] guaiFENesin (MUCINEX) 600 MG 12 hr tablet Take 600 mg by mouth 2 (two) times daily as needed.     [name] HYDROcodone-homatropine (HYDROMET) 5-1.5 MG/5ML syrup Take 5 mLs by mouth at bedtime as needed. 120 mL 0  . ipratropium-albuterol (DUONEB) 0.5-2.5 (3) MG/3ML SOLN Take 3 mLs by nebulization every 6 (six) hours as needed. 360 mL 11  . LETAIRIS 10 MG tablet TAKE 1 TABLET BY MOUTH ONCE DAILY 30 tablet 10  . OXYGEN 2-3 lpm 24/7  AHC    . predniSONE (DELTASONE) 5 MG tablet Take 1 tablet(85m) by mouth daily alternating with 136m2 tab)    . Treprostinil (TYVASO) 0.6 MG/ML SOLN Inhale 18 mcg into the lungs 4 (four) times daily.     No current  facility-administered medications for this encounter.    Allergies:   Patient has no known allergies.   Social History:  The patient  reports that she quit smoking about 8 years ago. Her smoking use included cigarettes. She has a 2.50 pack-year smoking history. She has never used smokeless tobacco. She reports that she does not drink alcohol or use drugs.   Family History:  The patient's family history includes Hypertension in her father and mother; Sarcoidosis in her maternal aunt.   ROS:  Please see the history of present illness.   All other systems are personally reviewed and negative.   Exam:   BP 130/78   Pulse 97   Wt 74.4 kg (164 lb)   SpO2 100% Comment: 3L of oxygen  BMI 32.03 kg/m  General: NAD Neck: No JVD, no thyromegaly or thyroid nodule.  Lungs: Occasional rhonchi CV: Nondisplaced PMI.  Heart regular S1/S2, no S3/S4, no murmur.  No peripheral edema.  No carotid bruit.  Normal pedal pulses.  Abdomen: Soft, nontender, no hepatosplenomegaly, no distention.  Skin: Intact without lesions or rashes.  Neurologic: Alert and oriented x 3.  Psych: Normal affect. Extremities: No clubbing or cyanosis.  HEENT: Normal.   Recent Labs: 06/02/2018: ALT 27 03/16/2019: B Natriuretic Peptide 51.5; BUN 9; Creatinine, Ser 0.93; Potassium 3.9; Sodium 139  Personally reviewed   Wt Readings from Last 3 Encounters:  03/16/19 74.4 kg (164 lb)  02/09/19 71.7 kg (158 lb)  12/02/18 72.6 kg (160 lb)      ASSESSMENT AND PLAN:  1. Sarcoidosis: Ocular and lung involvement, biopsy-proven.  Cardiac MRI in 5/18 did not show evidence for cardiac sarcoidosis.  She is on prednisone and home oxygen. She has been evaluated at [facility] for lung transplant, this is currently on hold as she had clinically improved on PH meds.  Ups and downs with breathing recently, has not been able to wean off prednisone.  - Follows with Dr. [name].  Continue prednisone per pulmonary.  2. Pulmonary hypertension: Pulmonary  arterial hypertension. Suspect mixed group 1 and group 5 PH. CTA chest showed no PE but did show changes of sarcoidosis.  PFTs were restrictive.  Echo showed RV strain and elevated PA pressure.  PAH was confirmed by [place] in 10/17 with PVR 7.4 WU.  Serologic workup was negative.  [place] in 4/18 showed mild to moderate residual pulmonary hypertension. She has felt like generic tadalafil has not been as effective as brand name Adcirca.  She is tolerating Tyvaso (unable to tolerate Malvin Johns).  Echo in 8/20 showed PASP 40 mmHg with normal-appearing RV.  6 minute walk was lower today than in the past, may be related to worsening of pulmonary sarcoidosis.  - Continue ambrisentan.   - Continue brand-name Adcirca 40 mg daily.  - Continue Tyvaso.   - Check BNP today.  3. CAD: Found incidentally on cath 4/18 done as part of workup for lung transplantation.  80% LAD stenosis, treated with DES to LAD.   No chest pain.  - Continue ASA 81 mg daily.   - Continue atorvastatin, check lipids today.   Recommended follow-up:  3 months  Signed, Loralie Champagne, MD  03/16/2019  [place] [address] [facility] [place] [address] [phone] (office) [phone] (fax)

## 2019-03-18 ENCOUNTER — Telehealth (HOSPITAL_COMMUNITY): Payer: Self-pay | Admitting: Cardiology

## 2019-03-18 NOTE — Telephone Encounter (Signed)
Received fax from [facility] PAP notifying office, patient will be withdrawn from program as her eligibility period expired.  confirmed with [employer]- new application will be sent to office 1/6/201, informed that our office will need to notify [place] program [phone]) on the patients behalf for re enrollment as well    Contacted [employer] program at 1110, patient is still active. Application is open as long as patient is on medication (letaris) . To assist with patient portion of patient assistance application, will over night application to patients home and call to review financial information.

## 2019-04-07 ENCOUNTER — Telehealth (HOSPITAL_COMMUNITY): Payer: Self-pay | Admitting: Pharmacy Technician

## 2019-04-07 NOTE — Telephone Encounter (Signed)
Spoke with patient about re-enrollment of Letairis assistance. Patient is going to come sign application, bring insurance card and proof of income in to office.  Will take application to check in desk for her.  Will follow up.  Charlann Boxer, CPhT

## 2019-04-19 ENCOUNTER — Other Ambulatory Visit: Payer: Self-pay | Admitting: Adult Health

## 2019-05-05 ENCOUNTER — Telehealth: Payer: Self-pay | Admitting: Emergency Medicine

## 2019-05-05 MED ORDER — PREDNISONE 5 MG PO TABS: ORAL_TABLET | ORAL | 2 refills | Status: DC

## 2019-05-05 NOTE — Telephone Encounter (Signed)
Called and spoke to pt. Pt states she is currently taking 2.76m of pred daily and is questioning if she should increase the dose due to a slight increase in SOB and cough. Advised pt that per Dr. BAgustina Carolilast note from 02/2019, it stated to alternated between 2.532mand 24m39mif doing well and tolerating well then ok to do 2.24mg80mily. Pt states she will alternated the 2.24mg 14m 24mg a74mif she isnt feeling better in a week she will call back. Offered pt an appt, pt denied and wanted to see if the alternating pred dose would help first. Nothing further needed at this time.   Will forward to RB as FYI.

## 2019-05-11 ENCOUNTER — Telehealth (HOSPITAL_COMMUNITY): Payer: Self-pay | Admitting: Pharmacist

## 2019-05-11 NOTE — Telephone Encounter (Signed)
Received message from [facility] Patient Assistance program that [name] had not submitted her patient assistance application or proof of income. She also never came to the front desk of the heart failure clinic to sign the application.   I called [name] and she said that she had mailed in the application because she couldn't make it into the clinic. She stated [employer] had contacted her last week to tell her the application was not complete and they were going to fax her a new application, which she has not received. I told [name] I would fax her a copy of the patient assistance application and provided her with the correct fax number to submit the application. She will send in the completed application and reach out to [place] if she has any further issues.   Audry Riles, PharmD, BCPS, BCCP, CPP Heart Failure Clinic Pharmacist [phone]

## 2019-05-13 ENCOUNTER — Telehealth (HOSPITAL_COMMUNITY): Payer: Self-pay | Admitting: Pharmacist

## 2019-05-13 NOTE — Telephone Encounter (Signed)
Advanced Heart Failure Patient Advocate Encounter   Patient was approved to receive Letairis from [facility] Patient Assistance [employer].  Service request number: [id number] Phone: [phone] Fax: [phone] Effective dates: 05/12/19 through 03/11/20  Audry Riles, PharmD, BCPS, BCCP, CPP Heart Failure Clinic Pharmacist [phone]

## 2019-05-17 ENCOUNTER — Other Ambulatory Visit: Payer: Self-pay | Admitting: Emergency Medicine

## 2019-06-02 ENCOUNTER — Other Ambulatory Visit: Payer: Self-pay | Admitting: Adult Health

## 2019-06-15 ENCOUNTER — Encounter (HOSPITAL_COMMUNITY): Payer: Medicare Other | Admitting: Cardiology

## 2019-06-19 ENCOUNTER — Telehealth: Payer: Self-pay | Admitting: Emergency Medicine

## 2019-06-19 NOTE — Telephone Encounter (Signed)
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 She would like a note to extend for the next 30 days until she can schedule her appt for her Covid vaccine. I advised he rthat I would have to ask Dr. [name]. She is is debating if she should go on retired disability leave but just needs the note for now. RB please advise.

## 2019-06-19 NOTE — Telephone Encounter (Signed)
ATC pt, no answer. Left message for pt to call back.  

## 2019-06-19 NOTE — Telephone Encounter (Signed)
Pt returning a phone call. Pt can be reached at [phone].

## 2019-06-21 ENCOUNTER — Other Ambulatory Visit (HOSPITAL_COMMUNITY): Payer: Self-pay | Admitting: Cardiology

## 2019-06-22 ENCOUNTER — Encounter: Payer: Self-pay | Admitting: *Deleted

## 2019-06-22 NOTE — Telephone Encounter (Signed)
OK to write letter to excuse her from close contact working for the next 30 days. After that should be Ok for her to return to duty.

## 2019-06-22 NOTE — Telephone Encounter (Signed)
Spoke with pt and advised that Dr [name] is ok with mentioned work note. Pt asked that I fax note to [phone]. Note Faxed. Nothing further needed at this time.

## 2019-07-05 ENCOUNTER — Other Ambulatory Visit: Payer: Self-pay | Admitting: Emergency Medicine

## 2019-07-14 ENCOUNTER — Telehealth (HOSPITAL_COMMUNITY): Payer: Self-pay

## 2019-07-14 NOTE — Telephone Encounter (Signed)
Received a fax from [facility] stating that they have been unable to contact patient to set up her shipment of tyvaso. I spoke with patient and she said that she spoke with them yesterday

## 2019-07-31 ENCOUNTER — Other Ambulatory Visit: Payer: Self-pay

## 2019-07-31 ENCOUNTER — Ambulatory Visit (HOSPITAL_COMMUNITY)
Admission: RE | Admit: 2019-07-31 | Discharge: 2019-07-31 | Disposition: A | Discharge status: Home / Self Care | Payer: Medicare Other | Source: Ambulatory Visit | Attending: Cardiology | Admitting: Cardiology

## 2019-07-31 ENCOUNTER — Encounter (HOSPITAL_COMMUNITY): Payer: Self-pay | Admitting: Cardiology

## 2019-07-31 VITALS — BP 126/74 | HR 88 | Ht 60.0 in | Wt 171.2 lb

## 2019-07-31 DIAGNOSIS — I2510 Atherosclerotic heart disease of native coronary artery without angina pectoris: Secondary | ICD-10-CM | POA: Insufficient documentation

## 2019-07-31 DIAGNOSIS — I2721 Secondary pulmonary arterial hypertension: Secondary | ICD-10-CM | POA: Insufficient documentation

## 2019-07-31 DIAGNOSIS — Z7952 Long term (current) use of systemic steroids: Secondary | ICD-10-CM | POA: Diagnosis not present

## 2019-07-31 DIAGNOSIS — Z87891 Personal history of nicotine dependence: Secondary | ICD-10-CM | POA: Insufficient documentation

## 2019-07-31 DIAGNOSIS — K219 Gastro-esophageal reflux disease without esophagitis: Secondary | ICD-10-CM | POA: Insufficient documentation

## 2019-07-31 DIAGNOSIS — Z79899 Other long term (current) drug therapy: Secondary | ICD-10-CM | POA: Diagnosis not present

## 2019-07-31 DIAGNOSIS — Z7982 Long term (current) use of aspirin: Secondary | ICD-10-CM | POA: Insufficient documentation

## 2019-07-31 DIAGNOSIS — Z9981 Dependence on supplemental oxygen: Secondary | ICD-10-CM | POA: Insufficient documentation

## 2019-07-31 DIAGNOSIS — Z7951 Long term (current) use of inhaled steroids: Secondary | ICD-10-CM | POA: Insufficient documentation

## 2019-07-31 DIAGNOSIS — D869 Sarcoidosis, unspecified: Secondary | ICD-10-CM | POA: Diagnosis present

## 2019-07-31 DIAGNOSIS — I272 Pulmonary hypertension, unspecified: Secondary | ICD-10-CM | POA: Diagnosis not present

## 2019-07-31 DIAGNOSIS — I25111 Atherosclerotic heart disease of native coronary artery with angina pectoris with documented spasm: Secondary | ICD-10-CM | POA: Diagnosis not present

## 2019-07-31 DIAGNOSIS — Z955 Presence of coronary angioplasty implant and graft: Secondary | ICD-10-CM | POA: Insufficient documentation

## 2019-07-31 DIAGNOSIS — J9611 Chronic respiratory failure with hypoxia: Secondary | ICD-10-CM | POA: Insufficient documentation

## 2019-07-31 MED ORDER — ATORVASTATIN CALCIUM 80 MG PO TABS: 80.0000 mg | ORAL_TABLET | Freq: Every day | ORAL | 3 refills | Status: DC

## 2019-07-31 NOTE — Progress Notes (Signed)
6MW completed Patient ambulated 750 ft (228.48m with rest breaks x 1  HR ranged 101-125 O2 sats ranged 84-100 on 3L of oxygen

## 2019-07-31 NOTE — Patient Instructions (Signed)
INCREASE ATORVASTATIN TO 80MG, ONE TAB DAILY  FASTING Labs needed in two months  Your physician recommends that you schedule a follow-up appointment in: 3 months with Dr [name] and echo   Your physician has requested that you have an echocardiogram. Echocardiography is a painless test that uses sound waves to create images of your heart. It provides your doctor with information about the size and shape of your heart and how well your heart's chambers and valves are working. This procedure takes approximately one hour. There are no restrictions for this procedure.   Do the following things EVERYDAY: 1) Weigh yourself in the morning before breakfast. Write it down and keep it in a log. 2) Take your medicines as prescribed 3) Eat low salt foods--Limit salt (sodium) to 2000 mg per day.  4) Stay as active as you can everyday 5) Limit all fluids for the day to less than 2 liters  At the [facility], you and your health needs are our priority. As part of our continuing mission to provide you with exceptional heart care, we have created designated Provider Care Teams. These Care Teams include your primary Cardiologist (physician) and Advanced Practice Providers (APPs- Physician Assistants and Nurse Practitioners) who all work together to provide you with the care you need, when you need it.   You may see any of the following providers on your designated Care Team at your next follow up: [name] Dr Glori Bickers . Dr Loralie Champagne . Darrick Grinder, NP . Lyda Jester, PA . Audry Riles, PharmD   Please be sure to bring in all your medications bottles to every appointment.

## 2019-08-02 NOTE — Progress Notes (Signed)
Date:  08/02/2019   ID:  Erin Zavala, DOB 12-Dec-1965, MRN [id number]  Provider location: [facility] Type of Visit: Established patient  PCP:  Patient, No Pcp Per  Cardiologist: Dr. [name]   History of Present Illness: Erin Zavala is a 53 y.o. female who has history of sarcoidosis, chronic hypoxemic respiratory failure on home oxygen, and pulmonary hypertension.  She was diagnosed with ocular sarcoidosis in 1997. It seems like she did not have significant lung complications (that were recognized at least) until early 2017.  She now requires home oxygen.  CTA chest shows changes consistent with sarcoidosis, this has been confirmed by biopsy.  Echo in 8/17 had evidence for RV strain and elevated PA pressure.  [place] in 10/17 confirmed pulmonary arterial hypertension with normal right and left heart filling pressures.    She has started on macitentan followed by Adcirca and most recently Tyvaso.  She did not tolerate selexipag well. She feels like these medications have helped her breathing.    She went to [facility] for transplant evaluation in 4/18.  As part of that evaluation, she had right and left heart cath.  Surprisingly, given age and lack of family history, she had an 80% LAD stenosis.  This was treated with DES.  RHC showed mild to moderate pulmonary hypertension. Transplant evaluation is currently on hold as she has been doing better symptomatically.   Echo in 8/20 showed EF 55-60%, normal RV, PASP 40 mmHg.   She returns for followup of pulmonary hypertension.  She is using 1 L oxygen by [place] at rest, 3 L with exertion.  She continues on low dose prednisone.  She has had increased dyspnea that she attributes to seasonal allergies, she has been sneezing a lot.  Occasional wheezing.  Breathing is "up and down."  Dyspneic after walking about 100 yards generally.  6 minute walk is worse today.  No fever, no chest pain, no lightheadedness/syncope.    Labs (9/17): NA  negative, ESR 112, anti-dsDNA negative, anti-CCP negative, RF < 14.  ACE 136.  Labs (10/17): K 4.1, creatinine 0.8 Labs (11/17): K 3.8, creatinine 0.91 Labs (3/18): BNP 16 Labs (5/18): K 4.6, creatinine 0.74 Labs (7/18): creatinine 0.79 Labs (9/18): K 4.3, creatinine 0.76 Labs (10/18): LDL 62, HDL 40 Labs (12/19): K 3.6, creatinine 0.73 Labs (1/20): BNP 17.7 Labs (3/20): LDL 67, HDL 65, TGs 185  Labs (8/20): BNP 53 Labs (1/21): K 3.9, creatinine 0.93, BNP 51, LDL 117  6 minute walk (10/17): 305 m.  6 minute walk (1/18): 305 m 6 minute walk (3/18): 270 m 6 minute walk (6/18): 302 m 6 minute walk (9/18): 396 m 6 minute walk (2/19): 213 m (could have kept going but was stopped due to decreased oxygen saturation).  6 minute walk (4/19): 396 m 6 minute walk (7/19): 381 m 6 minute walk (8/20) 359 m 6 minute walk (1/21): 289 m 6 minute walk (5/21): 229 m  PMH: 1. GERD 2. Pulmonary hypertension: Suspect Group 5 PH related to sarcoidosis.  - CTA chest (8/17) with no PE but mediastinal/hilar LAN, cystic lung changes, subpleural consolidation, patchy ground glass.  - Echo (8/17): EF 55-60%, D-shaped interventricular septum, PASP 60 mmHg. - ANA negative, ESR 112, anti-dsDNA negative, anti-CCP negative, RF < 14.  - RHC (10/17): mean RA 3, PA 59/22 mean 36, mean PCWP 5, CI 2.92, PVR 7.4 WU.  - PFTs (10/17): Restrictive.  - RHC (4/18): mean RA 4, PA mean  32, mean PCWP 8, CI 2.9, PVR 5.2 WU.  - Did not tolerate selexipag.  - Echo (5/19): EF 60-65%, mild LVH, mild MR, RV normal size and systolic function, PASP 37 mmHg.  - Echo (8/20): EF 55-60%, normal RV size and systolic function, PASP 40 mmHg, IVC normal 3. Sarcoidosis: Diagnosed with ocular sarcoidosis in 1997. Significant pulmonary involvement noted in 8/17.  Lung biopsy positive.  - Cardiac MRI (5/18): EF 66%, normal RV size with low normal RV systolic function (EF 19%), no LGE (no evidence for cardiac sarcoidosis).  - CT chest 3/19  with stable pulmonary fibrotic changes related to sarcoidosis.  - PFTs (4/19): FVC 66%, FEV1 69%, ratio 103%, DLCO 37%, TLC 62% => moderate -severe mixed obstruction/restriction.  4. Chronic hypoxemic respiratory failure: Home oxygen 5. Childhood asthma. 6. CAD: LHC (4/18) with 80% LAD stenosis => DES to LAD.   Current Outpatient Medications  Medication Sig Dispense Refill  . ADCIRCA 20 MG tablet TAKE 2 TABLETS BY MOUTH ONCE DAILY. 60 tablet 10  . albuterol (PROVENTIL HFA;VENTOLIN HFA) 108 (90 Base) MCG/ACT inhaler Inhale 2 puffs into the lungs every 4 (four) hours as needed for wheezing or shortness of breath. 1 Inhaler 5  . aspirin 81 MG tablet Take 1 tablet (81 mg total) by mouth daily. 30 tablet 0  . atorvastatin (LIPITOR) 80 MG tablet Take 1 tablet (80 mg total) by mouth daily. 90 tablet 3  . budesonide-formoterol (SYMBICORT) 160-4.5 MCG/ACT inhaler Inhale 2 puffs into the lungs 2 (two) times daily. 2 Inhaler 0  . cetirizine (ZYRTEC) 10 MG tablet TAKE 1 TABLET BY MOUTH EVERY DAY 90 tablet 0  . esomeprazole (NEXIUM) 40 MG capsule Take 1 capsule (40 mg total) by mouth daily. 30 capsule 5  . ferrous sulfate 325 (65 FE) MG tablet TAKE 1 TABLET (325 MG TOTAL) BY MOUTH 2 (TWO) TIMES DAILY WITH A MEAL. 60 tablet 5  . fluticasone (FLONASE) 50 MCG/ACT nasal spray Place 2 sprays into both nostrils daily.    [name] guaiFENesin (MUCINEX) 600 MG 12 hr tablet Take 600 mg by mouth 2 (two) times daily as needed.     [name] ipratropium-albuterol (DUONEB) 0.5-2.5 (3) MG/3ML SOLN Take 3 mLs by nebulization every 6 (six) hours as needed. 360 mL 11  . LETAIRIS 10 MG tablet TAKE 1 TABLET BY MOUTH ONCE DAILY 30 tablet 10  . OXYGEN 2-3 lpm 24/7  AHC    . predniSONE (DELTASONE) 10 MG tablet TAKE 1/2 TO 1 TABLET BY MOUTH DAILY AS DIRECTED 30 tablet 2  . predniSONE (DELTASONE) 5 MG tablet Take 2.54m - 559mdaily. 30 tablet 2  . Treprostinil (TYVASO) 0.6 MG/ML SOLN Inhale 18 mcg into the lungs 4 (four) times daily.     No  current facility-administered medications for this encounter.    Allergies:   Patient has no known allergies.   Social History:  The patient  reports that she quit smoking about 8 years ago. Her smoking use included cigarettes. She has a 2.50 pack-year smoking history. She has never used smokeless tobacco. She reports that she does not drink alcohol or use drugs.   Family History:  The patient's family history includes Hypertension in her father and mother; Sarcoidosis in her maternal aunt.   ROS:  Please see the history of present illness.   All other systems are personally reviewed and negative.   Exam:   BP 126/74   Pulse 88   Ht 5' (1.524 m)   Wt 77.7  kg (171 lb 3.2 oz)   SpO2 100%   BMI 33.44 kg/m  General: NAD Neck: No JVD, no thyromegaly or thyroid nodule.  Lungs: Occasional wheezes. CV: Nondisplaced PMI.  Heart regular S1/S2, no S3/S4, no murmur.  No peripheral edema.  No carotid bruit.  Normal pedal pulses.  Abdomen: Soft, nontender, no hepatosplenomegaly, no distention.  Skin: Intact without lesions or rashes.  Neurologic: Alert and oriented x 3.  Psych: Normal affect. Extremities: No clubbing or cyanosis.  HEENT: Normal.   Recent Labs: 03/16/2019: B Natriuretic Peptide 51.5; BUN 9; Creatinine, Ser 0.93; Potassium 3.9; Sodium 139  Personally reviewed   Wt Readings from Last 3 Encounters:  07/31/19 77.7 kg (171 lb 3.2 oz)  03/16/19 74.4 kg (164 lb)  02/09/19 71.7 kg (158 lb)      ASSESSMENT AND PLAN:  1. Sarcoidosis: Ocular and lung involvement, biopsy-proven.  Cardiac MRI in 5/18 did not show evidence for cardiac sarcoidosis.  She is on prednisone and home oxygen. She has been evaluated at [facility] for lung transplant, this is currently on hold as she had clinically improved on PH meds.  Ups and downs with breathing recently, has not been able to wean off prednisone.  - Follows with Dr. [name].  Continue prednisone per pulmonary.  - I will refer to pulmonary rehab.   2. Pulmonary hypertension: Pulmonary arterial hypertension. Suspect mixed group 1 and group 5 PH. CTA chest showed no PE but did show changes of sarcoidosis.  PFTs were restrictive.  Echo showed RV strain and elevated PA pressure.  PAH was confirmed by [place] in 10/17 with PVR 7.4 WU.  Serologic workup was negative.  [place] in 4/18 showed mild to moderate residual pulmonary hypertension. She has felt like generic tadalafil has not been as effective as brand name Adcirca.  She is tolerating Tyvaso (unable to tolerate Malvin Johns).  Echo in 8/20 showed PASP 40 mmHg with normal-appearing RV.  6 minute walk was lower today than at last appointment, may be related to worsening of pulmonary sarcoidosis versus recent asthmatic-type symptoms with allergies.  She is not volume overloaded on exam. .  - Continue ambrisentan.   - Continue brand-name Adcirca 40 mg daily.  - Continue Tyvaso.   - Check BNP today.  - She will get repeat echo at 6 month followup to reassess RV and noninvasive PA pressure estimation.  3. CAD: Found incidentally on cath 4/18 done as part of workup for lung transplantation.  80% LAD stenosis, treated with DES to LAD.   No chest pain.  - Continue ASA 81 mg daily.   - Goal LDL < 70, will increase atorvastatin to 80 mg daily with lipids/LFTs in 2 months.   Recommended follow-up:  3 months with echo  Signed, Loralie Champagne, MD  08/02/2019  [facility] [place] [address] [facility] [place] [address] [phone] (office) [phone] (fax)

## 2019-08-03 ENCOUNTER — Encounter (HOSPITAL_COMMUNITY): Payer: Self-pay | Admitting: *Deleted

## 2019-08-03 NOTE — Progress Notes (Signed)
Received referral from Dr. [name] for this pt to participate in pulmonary rehab with the the diagnosis of Pulmonary rehab.  Pt is well know to both cardiac and pulmonary rehab from previous participation.  . Clinical review of pt follow up appt on 5/21 pulmonary hypertension and 02/19/19 with Dr. [name] office note.  Pt with Covid Risk Score - 3. Pt appropriate for scheduling for Pulmonary rehab.  Will forward to support staff for verification of insurance eligibility/benefits and to pulmonary rehab staff for scheduling with pt consent. Cherre Huger, BSN Cardiac and Training and development officer

## 2019-08-04 ENCOUNTER — Telehealth (HOSPITAL_COMMUNITY): Payer: Self-pay

## 2019-08-04 NOTE — Telephone Encounter (Signed)
Pt insurance is active and benefits verified through Medicare a/b Co-pay 0, DED $203/$203 met, out of pocket 0/0 met, co-insurance 20%. no pre-authorization required.

## 2019-08-05 ENCOUNTER — Other Ambulatory Visit: Payer: Self-pay | Admitting: Emergency Medicine

## 2019-08-06 ENCOUNTER — Telehealth (HOSPITAL_COMMUNITY): Payer: Self-pay

## 2019-08-20 ENCOUNTER — Telehealth (HOSPITAL_COMMUNITY): Payer: Self-pay | Admitting: *Deleted

## 2019-08-20 NOTE — Telephone Encounter (Signed)
LM of appointment in pulmonary rehab 08/24/2019 @ 0900 for orientation/walk test, call [phone] for cancellation or questions.

## 2019-08-24 ENCOUNTER — Ambulatory Visit (HOSPITAL_COMMUNITY): Payer: Medicare Other

## 2019-08-24 ENCOUNTER — Telehealth (HOSPITAL_COMMUNITY): Payer: Self-pay | Admitting: *Deleted

## 2019-08-24 NOTE — Telephone Encounter (Signed)
Patient called 20 minutes after her appointment time for orientation/walk test to pulmonary rehab, states she does not feel well, congested and diarrhea. She asked to be rescheduled, at this point we schedule 3 weeks out and all slots are full, will call in July to reschedule.

## 2019-09-01 ENCOUNTER — Telehealth (HOSPITAL_COMMUNITY): Payer: Self-pay | Admitting: *Deleted

## 2019-09-01 NOTE — Telephone Encounter (Signed)
Pt left vm requesting note for work. I called back to get more information no answerr/left vm requesting return call.

## 2019-09-03 ENCOUNTER — Encounter (HOSPITAL_COMMUNITY): Payer: Self-pay | Admitting: *Deleted

## 2019-09-07 ENCOUNTER — Other Ambulatory Visit: Payer: Self-pay | Admitting: Emergency Medicine

## 2019-09-30 ENCOUNTER — Other Ambulatory Visit (HOSPITAL_COMMUNITY): Payer: Medicare Other

## 2019-10-01 ENCOUNTER — Other Ambulatory Visit (HOSPITAL_COMMUNITY): Payer: Medicare Other

## 2019-10-04 ENCOUNTER — Other Ambulatory Visit: Payer: Self-pay | Admitting: Emergency Medicine

## 2019-10-06 ENCOUNTER — Other Ambulatory Visit (HOSPITAL_COMMUNITY): Payer: Medicare Other

## 2019-10-07 ENCOUNTER — Other Ambulatory Visit: Payer: Self-pay

## 2019-10-07 ENCOUNTER — Ambulatory Visit (HOSPITAL_COMMUNITY)
Admission: RE | Admit: 2019-10-07 | Discharge: 2019-10-07 | Disposition: A | Discharge status: Home / Self Care | Payer: Medicare Other | Source: Ambulatory Visit | Attending: Internal Medicine | Admitting: Internal Medicine

## 2019-10-07 DIAGNOSIS — I272 Pulmonary hypertension, unspecified: Secondary | ICD-10-CM | POA: Diagnosis not present

## 2019-10-07 DIAGNOSIS — I25111 Atherosclerotic heart disease of native coronary artery with angina pectoris with documented spasm: Secondary | ICD-10-CM | POA: Diagnosis present

## 2019-10-07 LAB — HEPATIC FUNCTION PANEL
ALT: 21 U/L (ref 0–44)
AST: 25 U/L (ref 15–41)
Albumin: 3.3 g/dL — ABNORMAL LOW (ref 3.5–5.0)
Alkaline Phosphatase: 46 U/L (ref 38–126)
Bilirubin, Direct: <0.1 mg/dL (ref 0.0–0.2)
Total Bilirubin: 0.6 mg/dL (ref 0.3–1.2)
Total Protein: 6.4 g/dL — ABNORMAL LOW (ref 6.5–8.1)

## 2019-10-07 LAB — LIPID PANEL
Cholesterol: 135 mg/dL (ref 0–200)
HDL: 60 mg/dL (ref >40)
LDL Cholesterol: 62 mg/dL (ref 0–99)
Total CHOL/HDL Ratio: 2.3 RATIO
Triglycerides: 66 mg/dL (ref <150)
VLDL: 13 mg/dL (ref 0–40)

## 2019-10-26 ENCOUNTER — Other Ambulatory Visit: Payer: Self-pay | Admitting: Emergency Medicine

## 2019-11-03 ENCOUNTER — Other Ambulatory Visit: Payer: Self-pay | Admitting: Emergency Medicine

## 2019-11-05 ENCOUNTER — Other Ambulatory Visit (HOSPITAL_COMMUNITY): Payer: Self-pay

## 2019-11-05 MED ORDER — TYVASO 0.6 MG/ML IN SOLN: 18.0000 ug | Freq: Four times a day (QID) | RESPIRATORY_TRACT | 3 refills | Status: DC

## 2019-11-25 ENCOUNTER — Ambulatory Visit (HOSPITAL_COMMUNITY): Payer: Medicare Other

## 2019-11-25 ENCOUNTER — Inpatient Hospital Stay (HOSPITAL_COMMUNITY): Admission: RE | Admit: 2019-11-25 | Payer: Medicare Other | Source: Ambulatory Visit | Admitting: Cardiology

## 2019-11-27 ENCOUNTER — Encounter (HOSPITAL_COMMUNITY): Payer: Self-pay

## 2019-12-01 IMAGING — DX DG CHEST 2V
2 series · 2 of 2 positions shown · non-contrast
Comparison: High-resolution chest CT 06/05/2017. Chest radiographs
05/03/2016.

CLINICAL DATA: 52-year-old female with cough and shortness of
breath for 10 days. Sarcoidosis.

EXAM:
CHEST - 2 VIEW

[chest pa]
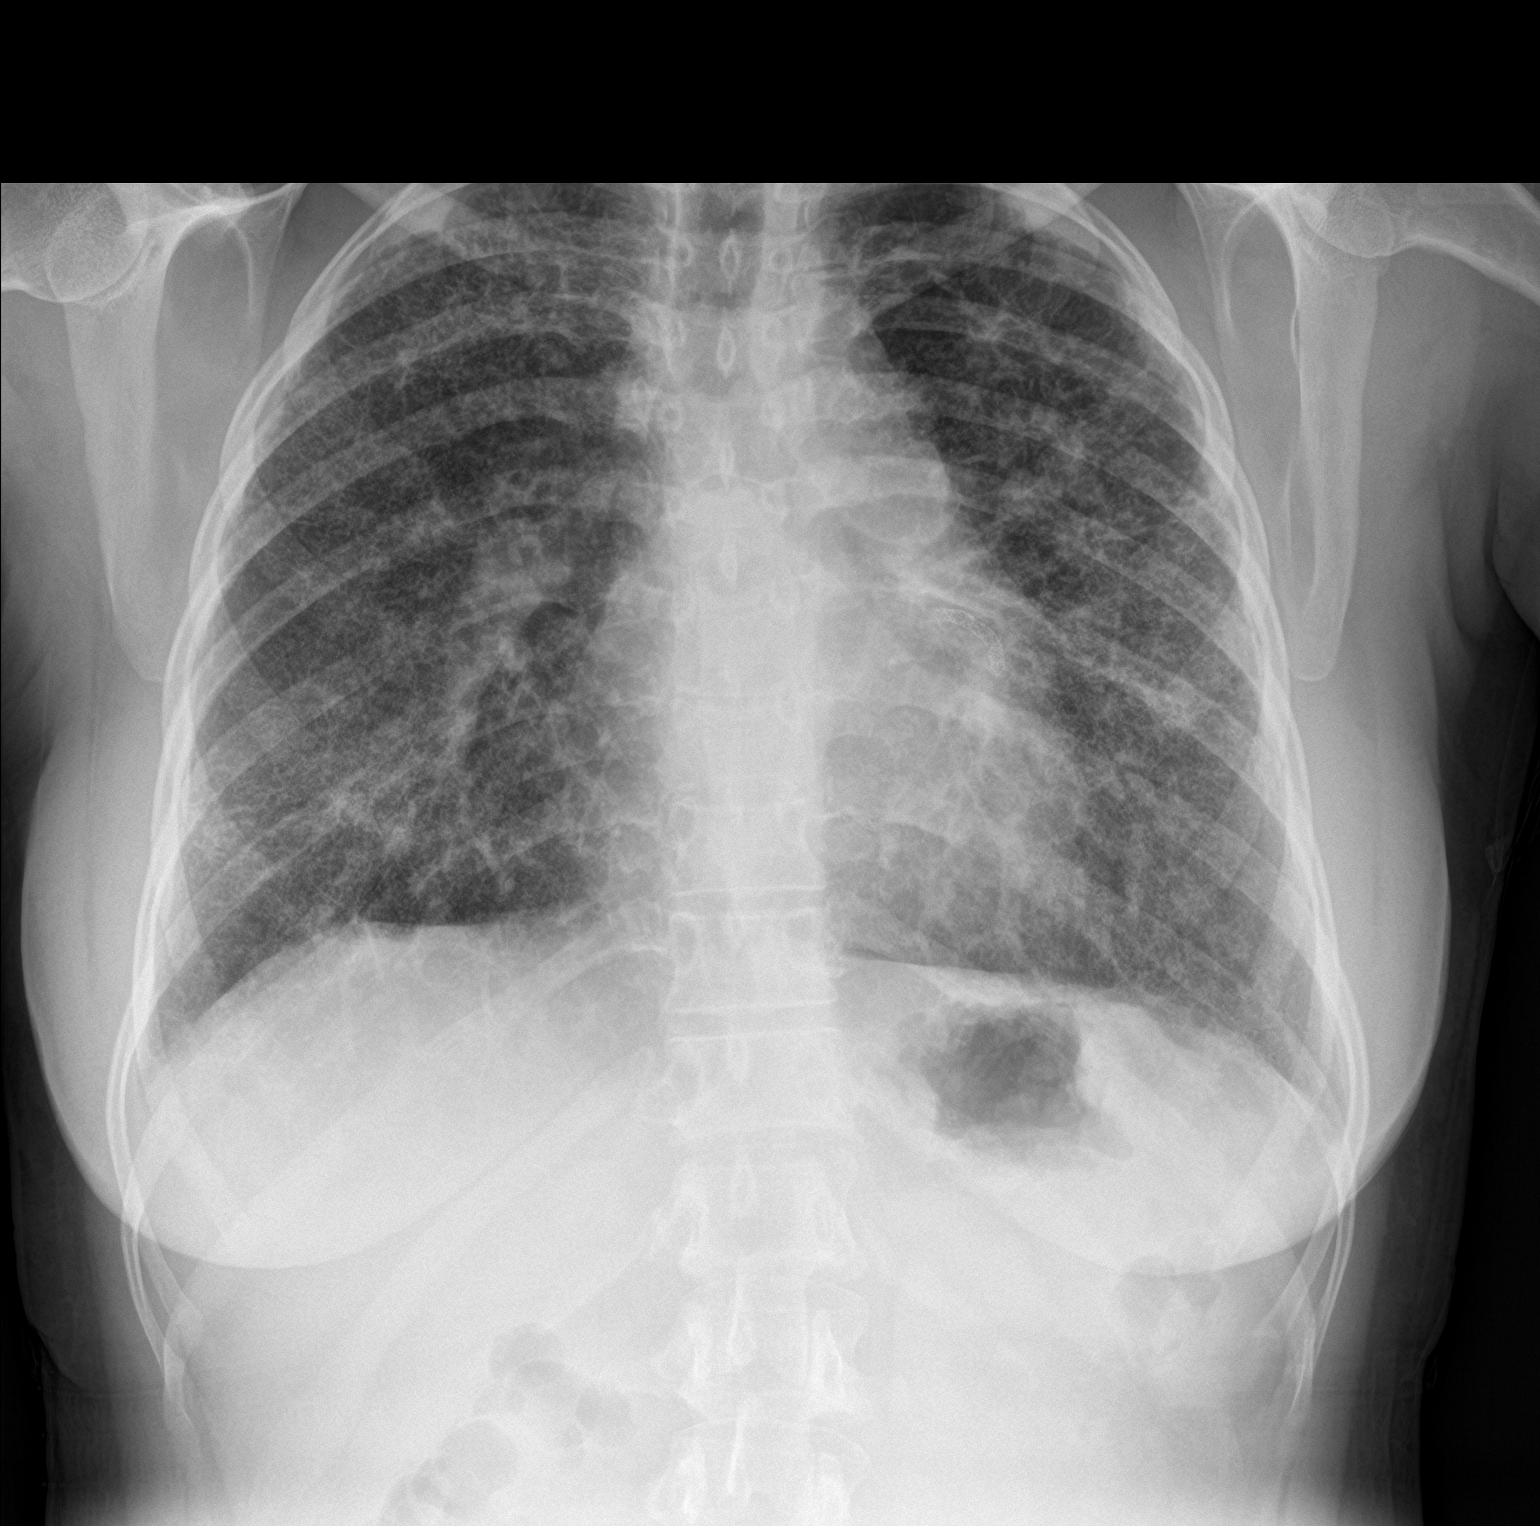

[chest lat]
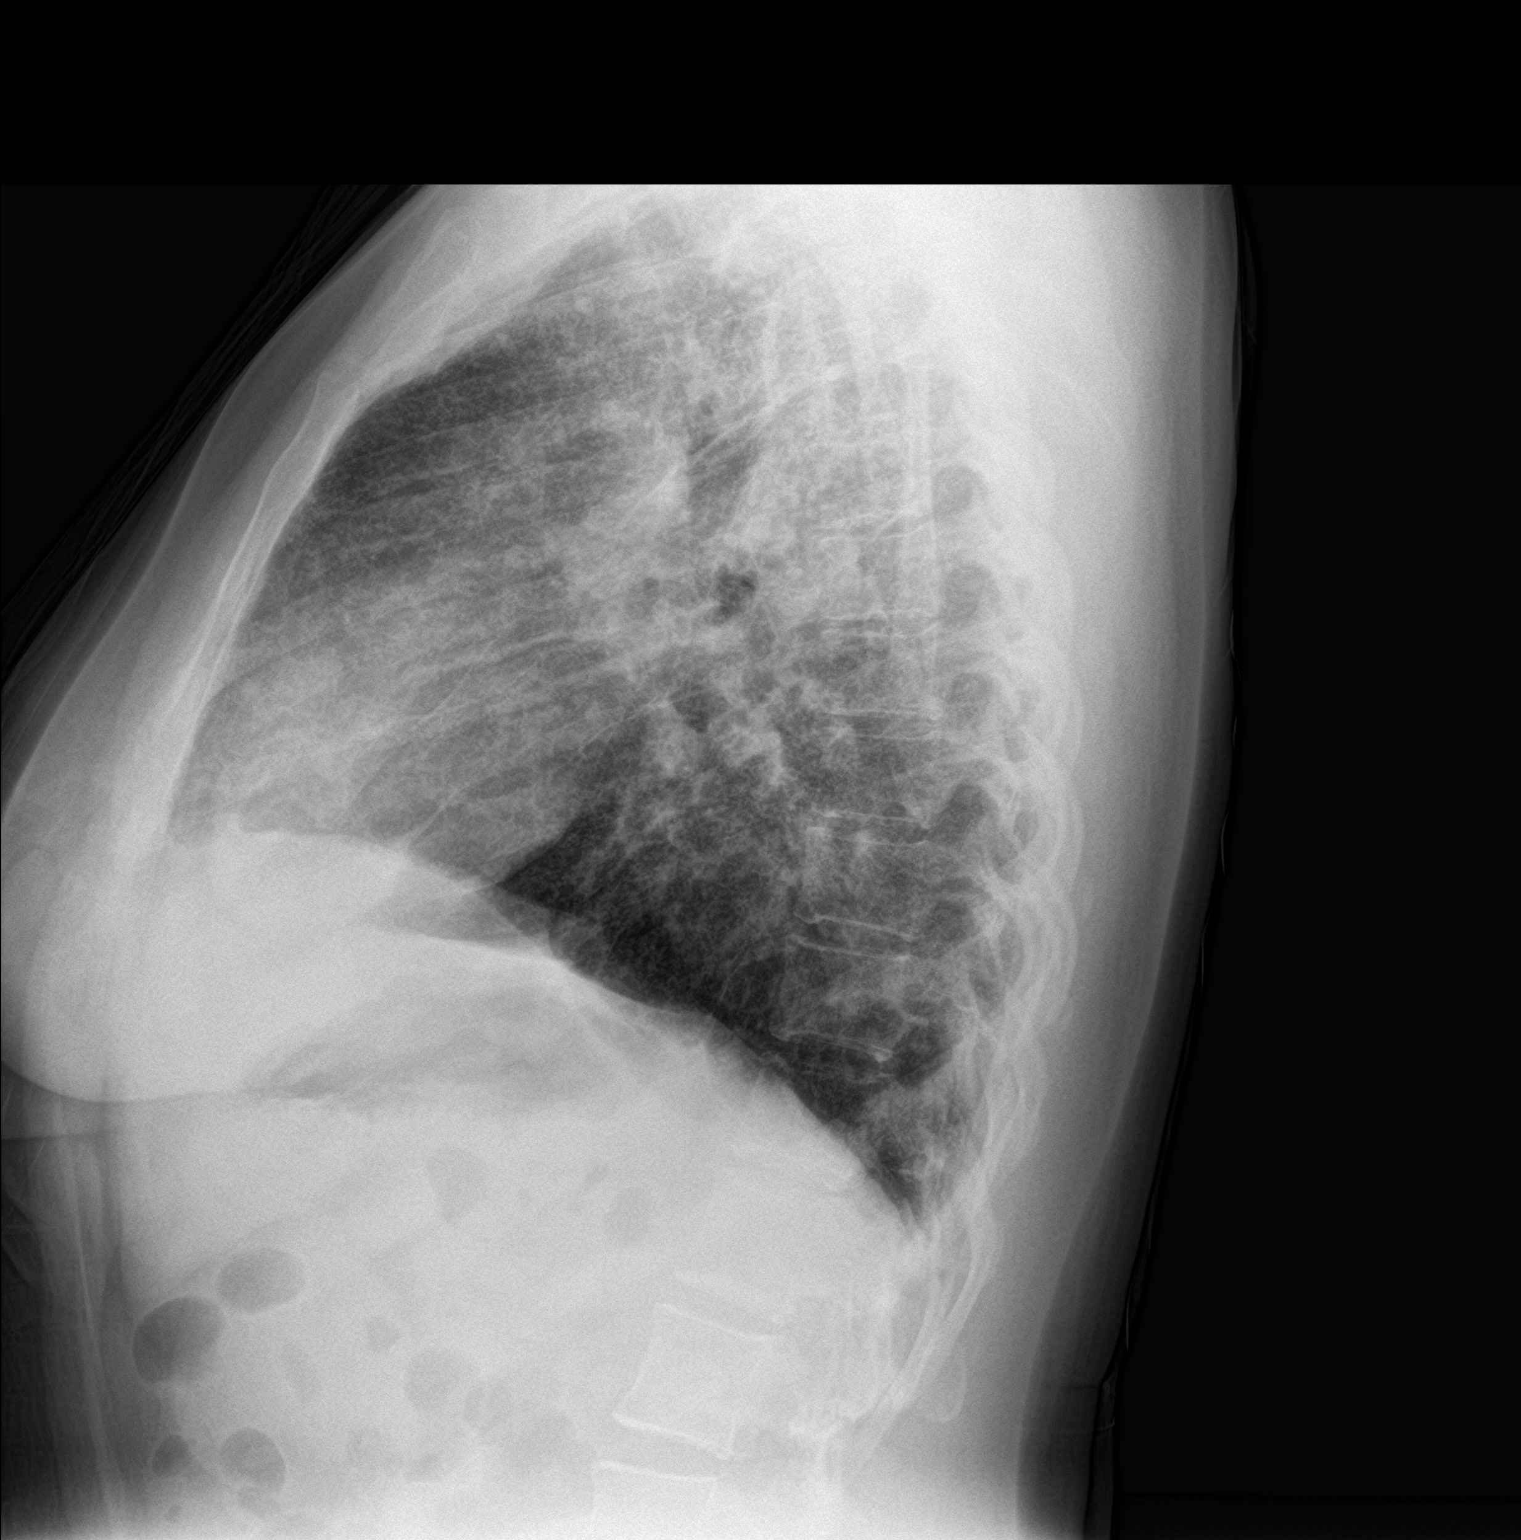

[2 of 2 positions shown; findings below may reference images not displayed]

FINDINGS: Diffuse coarse bilateral pulmonary interstitial opacity appears
stable since [REDACTED]. Stable lung volumes. No pneumothorax, pleural
effusion or acute pulmonary opacity. Stable mediastinal contours. No
cardiomegaly. Visualized tracheal air column is within normal
limits. No acute osseous abnormality identified. Negative visible
bowel gas pattern.
IMPRESSION: Stable Sarcoid related chronic lung disease. No superimposed acute
findings are identified.

## 2019-12-04 ENCOUNTER — Other Ambulatory Visit (HOSPITAL_COMMUNITY): Payer: Self-pay | Admitting: Cardiology

## 2019-12-18 ENCOUNTER — Other Ambulatory Visit: Payer: Self-pay | Admitting: Emergency Medicine

## 2019-12-19 ENCOUNTER — Other Ambulatory Visit: Payer: Self-pay | Admitting: Emergency Medicine

## 2019-12-24 ENCOUNTER — Other Ambulatory Visit (HOSPITAL_COMMUNITY): Payer: Self-pay | Admitting: Cardiology

## 2019-12-24 ENCOUNTER — Ambulatory Visit (INDEPENDENT_AMBULATORY_CARE_PROVIDER_SITE_OTHER): Payer: Medicare Other | Admitting: Adult Health

## 2019-12-24 ENCOUNTER — Other Ambulatory Visit: Payer: Self-pay

## 2019-12-24 ENCOUNTER — Encounter: Payer: Self-pay | Admitting: Adult Health

## 2019-12-24 DIAGNOSIS — I2510 Atherosclerotic heart disease of native coronary artery without angina pectoris: Secondary | ICD-10-CM

## 2019-12-24 DIAGNOSIS — I272 Pulmonary hypertension, unspecified: Secondary | ICD-10-CM | POA: Diagnosis not present

## 2019-12-24 DIAGNOSIS — Z23 Encounter for immunization: Secondary | ICD-10-CM

## 2019-12-24 DIAGNOSIS — J9611 Chronic respiratory failure with hypoxia: Secondary | ICD-10-CM | POA: Diagnosis not present

## 2019-12-24 DIAGNOSIS — D860 Sarcoidosis of lung: Secondary | ICD-10-CM

## 2019-12-24 DIAGNOSIS — J449 Chronic obstructive pulmonary disease, unspecified: Secondary | ICD-10-CM

## 2019-12-24 NOTE — Assessment & Plan Note (Signed)
Appears currently stable.  She has been on chronic steroids for a while now.  We had a discussion again about maintaining routine follow-up with her primary care provider as she has increased risk of diabetes and osteoporosis along with other medical problems due to chronic steroid use.  Will check PFTs on return if appears stable and patient clinically is stable we will continue to try to lower her daily dose of prednisone below 10 mg daily. She is continue with follow-up with ophthalmology.  Plan  Patient Instructions  Continue on prednisone 10 mg daily  Saline nasal rinses As needed   Continue on Symbicort 160 2 puffs twice daily.  Use DuoNeb nebulizer as needed Continue on Oxygen 3l/m  Follow up with Primary Provider for routine labs and bone health.,  Continue follow-up with cardiology Follow up in 3 months with Dr. [name] with spirometry with DLCO Please contact office for sooner follow up if symptoms do not improve or worsen or seek emergency care

## 2019-12-24 NOTE — Assessment & Plan Note (Signed)
Continue on oxygen at 3 L to keep O2 saturations greater than 88 to 90%.

## 2019-12-24 NOTE — Assessment & Plan Note (Signed)
Continue follow-up with cardiology and current maintenance regimen

## 2019-12-24 NOTE — Addendum Note (Signed)
Addended by: Desmond Dike C on: 12/24/2019 04:25 PM   Modules accepted: Orders

## 2019-12-24 NOTE — Patient Instructions (Addendum)
Continue on prednisone 10 mg daily  Saline nasal rinses As needed   Continue on Symbicort 160 2 puffs twice daily.  Use DuoNeb nebulizer as needed Continue on Oxygen 3l/m  Follow up with Primary Provider for routine labs and bone health.,  Continue follow-up with cardiology Follow up in 3 months with Dr. [name] with spirometry with DLCO Please contact office for sooner follow up if symptoms do not improve or worsen or seek emergency care

## 2019-12-24 NOTE — Progress Notes (Signed)
_0  ID: Erin Zavala, female    DOB: 1966-03-06, 54 y.o.   MRN: [id number]  Chief Complaint  Patient presents with  . Follow-up    Sarcoid     Referring provider: No ref. provider found  HPI: 54 year old female former smoker followed for sarcoidosis with associated ILD ,  Diagnosed with ocular sarcoid in 1997 Patient also has COPD. She has multiple medical problems complicated by severe pulmonary hypertension and chronic respiratory failure on oxygen  TEST/EVENTS :  High-resolution CT chest March 2019 showed mediastinal hilar and pulmonary parenchymal fibrotic changes of sarcoid  2D echo May 2019 showed EF 60 to 65%, pulmonary artery pressure 37 mmHg (significant improvement from previous pulmonary pressure at 60 mmHg)  PFT April 2019 showed FEV1 74%, ratio 87, FVC 68%, no significant bronchodilator response. Positive mid flow reversibility. DLCO 34%.  ANA negative, ESR 112, anti-dsDNA negative, anti-CCP negative, RF <14.   2018 [facility] evaluation for transplant eval ,right and left heart cath. Showed 80% LAD stenosis. She was treated with DES.Right heart cath showed mild to moderate pulmonary hypertension.  HRCT chest 3/2020showed stable chronic changes with diffuse groundglass opacities, moderate pulmonary fibrosis and traction bronchiectasis. No significant change since March 2019, notable progression since August 2017.  12/24/2019 Follow up : Sarcoid , O2 RF , COPD  Patient returns for a follow-up visit.  Last seen December 2021.  She says overall her breathing is doing about the same.  She gets winded with mild to moderate activity.  She remains on chronic steroids.  Last visit had been recommended continue to taper baseline prednisone down to try to get closer to 2.5 mg.  However patient says anytime that she gets down around 5 mg of prednisone she starts to have more symptoms she is currently on 10 mg of prednisone. She remains on oxygen 3 L.  Says she is had  no increased oxygen demands. She denies any flare of cough or wheezing. She says she has been more sedentary over the last year as she has not worked since last fall from her job.  She previously worked at the [employer].   She is followed by cardiology for her pulmonary hypertension currently on Tyvaso, Letairis and Adcirca.  She denies any increased leg swelling.  No increased oxygen demands.  No Known Allergies  Immunization History  Administered Date(s) Administered  . Influenza,inj,Quad PF,6+ Mos 02/28/2016, 12/18/2016, 01/02/2018, 11/04/2018  . PPD Test 02/28/2016  . Pneumococcal Conjugate-13 11/29/2016  . Pneumococcal Polysaccharide-23 11/03/2015    Past Medical History:  Diagnosis Date  . Asthma    as a child  . Cough   . Dyspnea   . GERD (gastroesophageal reflux disease)   . Migraines   . Oxygen dependent   . Pulmonary hypertension ([place])   . Sarcoidosis     Tobacco History: Social History   Tobacco Use  Smoking Status Former Smoker  . Packs/day: 0.25  . Years: 10.00  . Pack years: 2.50  . Types: Cigarettes  . Quit date: 01/11/2011  . Years since quitting: 8.9  Smokeless Tobacco Never Used   Counseling given: Not Answered   Outpatient Medications Prior to Visit  Medication Sig Dispense Refill  . ADCIRCA 20 MG tablet TAKE 2 TABLETS BY MOUTH ONCE DAILY. CALL [phone] TO REFILL 60 tablet 2  . albuterol (PROVENTIL HFA;VENTOLIN HFA) 108 (90 Base) MCG/ACT inhaler Inhale 2 puffs into the lungs every 4 (four) hours as needed for wheezing or shortness of breath. 1 Inhaler 5  .  aspirin 81 MG tablet Take 1 tablet (81 mg total) by mouth daily. 30 tablet 0  . atorvastatin (LIPITOR) 80 MG tablet Take 1 tablet (80 mg total) by mouth daily. 90 tablet 3  . budesonide-formoterol (SYMBICORT) 160-4.5 MCG/ACT inhaler Inhale 2 puffs into the lungs 2 (two) times daily. 2 Inhaler 0  . cetirizine (ZYRTEC) 10 MG tablet TAKE 1 TABLET BY MOUTH EVERY DAY 90 tablet 0  .  esomeprazole (NEXIUM) 40 MG capsule Take 1 capsule (40 mg total) by mouth daily. 30 capsule 5  . ferrous sulfate 325 (65 FE) MG tablet TAKE 1 TABLET (325 MG TOTAL) BY MOUTH 2 (TWO) TIMES DAILY WITH A MEAL. 60 tablet 5  . fluticasone (FLONASE) 50 MCG/ACT nasal spray Place 2 sprays into both nostrils daily.    [name] guaiFENesin (MUCINEX) 600 MG 12 hr tablet Take 600 mg by mouth 2 (two) times daily as needed.     [name] ipratropium-albuterol (DUONEB) 0.5-2.5 (3) MG/3ML SOLN TAKE 3 MLS BY NEBULIZATION EVERY 6 (SIX) HOURS AS NEEDED. 360 mL 11  . LETAIRIS 10 MG tablet TAKE 1 TABLET BY MOUTH ONCE DAILY 30 tablet 10  . OXYGEN 2-3 lpm 24/7  AHC    . predniSONE (DELTASONE) 10 MG tablet TAKE 1/2 TO 1 TABLET BY MOUTH DAILY AS DIRECTED 30 tablet 2  . predniSONE (DELTASONE) 5 MG tablet TAKE 1/2 TO 1 TABLET BY MOUTH DAILY 30 tablet 2  . Treprostinil (TYVASO) 0.6 MG/ML SOLN Inhale 18 mcg into the lungs 4 (four) times daily. 10 mL 3   No facility-administered medications prior to visit.     Review of Systems:   Constitutional:   No  weight loss, night sweats,  Fevers, chills,  +fatigue, or  lassitude.  HEENT:   No headaches,  Difficulty swallowing,  Tooth/dental problems, or  Sore throat,                No sneezing, itching, ear ache, nasal congestion, post nasal drip,   CV:  No chest pain,  Orthopnea, PND, swelling in lower extremities, anasarca, dizziness, palpitations, syncope.   GI  No heartburn, indigestion, abdominal pain, nausea, vomiting, diarrhea, change in bowel habits, loss of appetite, bloody stools.   Resp:   No chest wall deformity  Skin: no rash or lesions.  GU: no dysuria, change in color of urine, no urgency or frequency.  No flank pain, no hematuria   MS:  No joint pain or swelling.  No decreased range of motion.  No back pain.    Physical Exam    GEN: A/Ox3; pleasant , NAD, well nourished, on oxygen    NECK:  Supple w/ fair ROM; no JVD; normal carotid impulses w/o bruits; no  thyromegaly or nodules palpated; no lymphadenopathy.    RESP diminished breath sounds in the bases  no accessory muscle use, no dullness to percussion  CARD:  RRR, no m/r/g, no peripheral edema, pulses intact, no cyanosis or clubbing.  GI:   Soft & nt; nml bowel sounds; no organomegaly or masses detected.   Musco: Warm bil, no deformities or joint swelling noted.   Neuro: alert, no focal deficits noted.    Skin: Warm, no lesions or rashes    Lab Results:  CBC   BNP  ProBNP No results found for: PROBNP  Imaging: No results found.    PFT Results Latest Ref Rng & Units 06/26/2017 11/28/2016 08/23/2016 12/27/2015  FVC-Pre L 1.61 1.69 1.70 1.53  FVC-Predicted Pre % 66 68 69  61  FVC-Post L 1.67 - - 1.56  FVC-Predicted Post % 68 - - 63  Pre FEV1/FVC % % 84 87 87 79  Post FEV1/FCV % % 87 - - 74  FEV1-Pre L 1.35 1.46 1.49 1.21  FEV1-Predicted Pre % 69 74 75 60  FEV1-Post L 1.46 - - 1.16  DLCO uncorrected ml/min/mmHg 6.52 5.26 5.96 3.85  DLCO UNC% % 34 _0 DLCO corrected ml/min/mmHg - 5.23 5.70 -  DLCO COR %Predicted % - 27 30 -  DLVA Predicted % 59 51 54 47  TLC L 2.80 - - 2.62  TLC % Predicted % 62 - - 58  RV % Predicted % 78 - - 70    No results found for: NITRICOXIDE      Assessment & Plan:   Pulmonary sarcoidosis (HCC) Appears currently stable.  She has been on chronic steroids for a while now.  We had a discussion again about maintaining routine follow-up with her primary care provider as she has increased risk of diabetes and osteoporosis along with other medical problems due to chronic steroid use.  Will check PFTs on return if appears stable and patient clinically is stable we will continue to try to lower her daily dose of prednisone below 10 mg daily. She is continue with follow-up with ophthalmology.  Plan  Patient Instructions  Continue on prednisone 10 mg daily  Saline nasal rinses As needed   Continue on Symbicort 160 2 puffs twice daily.    Use DuoNeb nebulizer as needed Continue on Oxygen 3l/m  Follow up with Primary Provider for routine labs and bone health.,  Continue follow-up with cardiology Follow up in 3 months with Dr. [name] with spirometry with DLCO Please contact office for sooner follow up if symptoms do not improve or worsen or seek emergency care       Pulmonary hypertension ([place]) Continue follow-up with cardiology and current maintenance regimen  Chronic respiratory failure with hypoxia ([place]) Continue on oxygen at 3 L to keep O2 saturations greater than 88 to 90%.  COPD (chronic obstructive pulmonary disease) (HCC) Continue on current regimen.  Appears to be stable. Check spirometry with DLCO on return Patient has not had her Covid vaccines.  Patient education given.  Plan  Patient Instructions  Continue on prednisone 10 mg daily  Saline nasal rinses As needed   Continue on Symbicort 160 2 puffs twice daily.  Use DuoNeb nebulizer as needed Continue on Oxygen 3l/m  Follow up with Primary Provider for routine labs and bone health.,  Continue follow-up with cardiology Follow up in 3 months with Dr. [name] with spirometry with DLCO Please contact office for sooner follow up if symptoms do not improve or worsen or seek emergency care          Rexene Edison, NP 12/24/2019

## 2019-12-24 NOTE — Assessment & Plan Note (Signed)
Continue on current regimen.  Appears to be stable. Check spirometry with DLCO on return Patient has not had her Covid vaccines.  Patient education given.  Plan  Patient Instructions  Continue on prednisone 10 mg daily  Saline nasal rinses As needed   Continue on Symbicort 160 2 puffs twice daily.  Use DuoNeb nebulizer as needed Continue on Oxygen 3l/m  Follow up with Primary Provider for routine labs and bone health.,  Continue follow-up with cardiology Follow up in 3 months with Dr. [name] with spirometry with DLCO Please contact office for sooner follow up if symptoms do not improve or worsen or seek emergency care

## 2019-12-25 ENCOUNTER — Telehealth: Payer: Self-pay | Admitting: Adult Health

## 2019-12-25 MED ORDER — PREDNISONE 10 MG PO TABS: 10.0000 mg | ORAL_TABLET | Freq: Every day | ORAL | 2 refills | Status: DC

## 2019-12-25 MED ORDER — ALBUTEROL SULFATE HFA 108 (90 BASE) MCG/ACT IN AERS: 2.0000 | INHALATION_SPRAY | RESPIRATORY_TRACT | 2 refills | Status: DC | PRN

## 2019-12-25 NOTE — Telephone Encounter (Signed)
Called and spoke with Patient.  Patient refills sent to requested [facility] pharmacy.  Nothing further at this time.

## 2020-01-11 ENCOUNTER — Telehealth (HOSPITAL_COMMUNITY): Payer: Self-pay | Admitting: Pharmacist

## 2020-01-11 NOTE — Telephone Encounter (Signed)
Sent in [employer] application to [facility] for Tyvaso.   Audry Riles, PharmD, BCPS, BCCP, CPP Heart Failure Clinic Pharmacist [phone]

## 2020-01-28 ENCOUNTER — Other Ambulatory Visit (HOSPITAL_COMMUNITY): Payer: Self-pay | Admitting: Cardiology

## 2020-02-17 ENCOUNTER — Telehealth: Payer: Self-pay | Admitting: Emergency Medicine

## 2020-02-17 ENCOUNTER — Encounter: Payer: Self-pay | Admitting: Pulmonary Disease

## 2020-02-17 DIAGNOSIS — J209 Acute bronchitis, unspecified: Secondary | ICD-10-CM

## 2020-02-17 MED ORDER — PREDNISONE 10 MG PO TABS: ORAL_TABLET | ORAL | 0 refills | Status: DC

## 2020-02-17 MED ORDER — AZITHROMYCIN 250 MG PO TABS: ORAL_TABLET | ORAL | 0 refills | Status: DC

## 2020-02-17 NOTE — Telephone Encounter (Signed)
Thank you

## 2020-02-17 NOTE — Telephone Encounter (Signed)
02/17/20   Patient contacted.  She is reporting on 02/15/2020 she has had increased congestion, significant amounts of mucus, sore throat.  She is currently unvaccinated COVID-45.  She reports that her oxygen levels are stable.  Sputum color is changed from baseline and is darker yellow.  Daughter gave patient at home COVID-19 test on 02/16/2020-these results were negative per patient.  Plan: We will treat with Z-Pak today We will treat with prednisone taper today Patient scheduled for follow-up with Dr. [name] on 03/21/2020 Encourage patient to seek emergent care evaluation if symptoms are worsening Patient aware that if fevers develop she needs to obtain a outpatient PCR Covid test, reviewed that these can be obtained at an urgent care Patient needs work note writing her out of work from 02/16/2020-02/19/2020, will route to clinical staff to coordinate -this work note can be sent via [place], does not need to be printed and mailed   We will route to Dr. [name] as Juluis Rainier

## 2020-02-17 NOTE — Telephone Encounter (Signed)
Work excuse note was generated and sent to pt via [place]

## 2020-03-07 ENCOUNTER — Ambulatory Visit (HOSPITAL_BASED_OUTPATIENT_CLINIC_OR_DEPARTMENT_OTHER)
Admission: RE | Admit: 2020-03-07 | Discharge: 2020-03-07 | Disposition: A | Discharge status: Home / Self Care | Payer: Medicare Other | Source: Ambulatory Visit | Attending: Cardiology | Admitting: Cardiology

## 2020-03-07 ENCOUNTER — Ambulatory Visit (HOSPITAL_COMMUNITY)
Admission: RE | Admit: 2020-03-07 | Discharge: 2020-03-07 | Disposition: A | Discharge status: Home / Self Care | Payer: Medicare Other | Source: Ambulatory Visit | Attending: Cardiology | Admitting: Cardiology

## 2020-03-07 ENCOUNTER — Encounter (HOSPITAL_COMMUNITY): Payer: Self-pay | Admitting: Cardiology

## 2020-03-07 ENCOUNTER — Other Ambulatory Visit: Payer: Self-pay

## 2020-03-07 VITALS — BP 138/82 | HR 114 | Wt 166.2 lb

## 2020-03-07 DIAGNOSIS — I272 Pulmonary hypertension, unspecified: Secondary | ICD-10-CM

## 2020-03-07 DIAGNOSIS — Z955 Presence of coronary angioplasty implant and graft: Secondary | ICD-10-CM | POA: Insufficient documentation

## 2020-03-07 DIAGNOSIS — Z283 Underimmunization status: Secondary | ICD-10-CM | POA: Insufficient documentation

## 2020-03-07 DIAGNOSIS — J9611 Chronic respiratory failure with hypoxia: Secondary | ICD-10-CM | POA: Insufficient documentation

## 2020-03-07 DIAGNOSIS — Z7952 Long term (current) use of systemic steroids: Secondary | ICD-10-CM | POA: Insufficient documentation

## 2020-03-07 DIAGNOSIS — D869 Sarcoidosis, unspecified: Secondary | ICD-10-CM | POA: Insufficient documentation

## 2020-03-07 DIAGNOSIS — Z9981 Dependence on supplemental oxygen: Secondary | ICD-10-CM | POA: Insufficient documentation

## 2020-03-07 DIAGNOSIS — Z7951 Long term (current) use of inhaled steroids: Secondary | ICD-10-CM | POA: Insufficient documentation

## 2020-03-07 DIAGNOSIS — J449 Chronic obstructive pulmonary disease, unspecified: Secondary | ICD-10-CM | POA: Diagnosis not present

## 2020-03-07 DIAGNOSIS — Z7185 Encounter for immunization safety counseling: Secondary | ICD-10-CM | POA: Diagnosis not present

## 2020-03-07 DIAGNOSIS — Z79899 Other long term (current) drug therapy: Secondary | ICD-10-CM | POA: Insufficient documentation

## 2020-03-07 DIAGNOSIS — Z8249 Family history of ischemic heart disease and other diseases of the circulatory system: Secondary | ICD-10-CM | POA: Insufficient documentation

## 2020-03-07 DIAGNOSIS — I2510 Atherosclerotic heart disease of native coronary artery without angina pectoris: Secondary | ICD-10-CM | POA: Insufficient documentation

## 2020-03-07 DIAGNOSIS — Z87891 Personal history of nicotine dependence: Secondary | ICD-10-CM | POA: Diagnosis not present

## 2020-03-07 DIAGNOSIS — Z7982 Long term (current) use of aspirin: Secondary | ICD-10-CM | POA: Diagnosis not present

## 2020-03-07 DIAGNOSIS — I509 Heart failure, unspecified: Secondary | ICD-10-CM | POA: Insufficient documentation

## 2020-03-07 DIAGNOSIS — I2721 Secondary pulmonary arterial hypertension: Secondary | ICD-10-CM | POA: Insufficient documentation

## 2020-03-07 HISTORY — DX: Heart failure, unspecified: I50.9

## 2020-03-07 LAB — ECHOCARDIOGRAM COMPLETE
Area-P 1/2: 4.06 cm2
S' Lateral: 1.9 cm

## 2020-03-07 LAB — BASIC METABOLIC PANEL
Anion gap: 10 (ref 5–15)
BUN: 13 mg/dL (ref 6–20)
CO2: 24 mmol/L (ref 22–32)
Calcium: 9.2 mg/dL (ref 8.9–10.3)
Chloride: 103 mmol/L (ref 98–111)
Creatinine, Ser: 0.91 mg/dL (ref 0.44–1.00)
GFR, Estimated: >60 mL/min (ref >60)
Glucose, Bld: 104 mg/dL — ABNORMAL HIGH (ref 70–99)
Potassium: 3.9 mmol/L (ref 3.5–5.1)
Sodium: 137 mmol/L (ref 135–145)

## 2020-03-07 LAB — BRAIN NATRIURETIC PEPTIDE: B Natriuretic Peptide: 96.1 pg/mL (ref 0.0–100.0)

## 2020-03-07 NOTE — Progress Notes (Signed)
Echocardiogram 2D Echocardiogram has been performed.  Oneal Deputy Wendle Kina 03/07/2020, 9:08 AM

## 2020-03-07 NOTE — Progress Notes (Signed)
6 Min Walk Test Completed  Pt ambulated 274.5mters  O2 Sat ranged 89-100 on 3L oxygen HR ranged 100-140

## 2020-03-07 NOTE — Progress Notes (Signed)
Date:  03/07/2020   ID:  Erin Zavala, DOB Apr 30, 1965, MRN [id number]  Provider location: [facility] Type of Visit: Established patient  PCP:  Patient, No Pcp Per  Cardiologist: Dr. [name]   History of Present Illness: Erin Zavala is a 54 y.o. female who has history of sarcoidosis, chronic hypoxemic respiratory failure on home oxygen, and pulmonary hypertension.  She was diagnosed with ocular sarcoidosis in 1997. It seems like she did not have significant lung complications (that were recognized at least) until early 2017.  She now requires home oxygen.  CTA chest shows changes consistent with sarcoidosis, this has been confirmed by biopsy.  Echo in 8/17 had evidence for RV strain and elevated PA pressure.  Erin Zavala in 10/17 confirmed pulmonary arterial hypertension with normal right and left heart filling pressures.    She has started on macitentan followed by Adcirca and most recently Tyvaso.  She did not tolerate selexipag well. She feels like these medications have helped her breathing.    She went to [facility] for transplant evaluation in 4/18.  As part of that evaluation, she had right and left heart cath.  Surprisingly, given age and lack of family history, she had an 80% LAD stenosis.  This was treated with DES.  RHC showed mild to moderate pulmonary hypertension. Transplant evaluation is currently on hold as she has been doing better symptomatically.   Echo in 8/20 showed EF 55-60%, normal RV, PASP 40 mmHg.  Echo was done today and reviewed, EF 60-65% with normal RV, PASP 31 mmHg.    She returns for followup of pulmonary hypertension.  She is using 1 L oxygen by [place] at rest, 3 L with exertion.  She continues on 10 mg prednisone.  Recent URI but feeling better now.  Has not had COVID-19 vaccination.  She is winded after walking 50 feet but does not have to stop.  No orthopnea/PND.  No chest pain.  No lightheadedness.  Weight is down 5 lbs.     ECG (personally  reviewed): sinus tachy 112  Labs (9/17): NA negative, ESR 112, anti-dsDNA negative, anti-CCP negative, RF < 14.  ACE 136.  Labs (10/17): K 4.1, creatinine 0.8 Labs (11/17): K 3.8, creatinine 0.91 Labs (3/18): BNP 16 Labs (5/18): K 4.6, creatinine 0.74 Labs (7/18): creatinine 0.79 Labs (9/18): K 4.3, creatinine 0.76 Labs (10/18): LDL 62, HDL 40 Labs (12/19): K 3.6, creatinine 0.73 Labs (1/20): BNP 17.7 Labs (3/20): LDL 67, HDL 65, TGs 185  Labs (8/20): BNP 53 Labs (1/21): K 3.9, creatinine 0.93, BNP 51, LDL 117 Labs (7/21): LDL 62, HDL 60  6 minute walk (10/17): 305 m.  6 minute walk (1/18): 305 m 6 minute walk (3/18): 270 m 6 minute walk (6/18): 302 m 6 minute walk (9/18): 396 m 6 minute walk (2/19): 213 m (could have kept going but was stopped due to decreased oxygen saturation).  6 minute walk (4/19): 396 m 6 minute walk (7/19): 381 m 6 minute walk (8/20) 359 m 6 minute walk (1/21): 289 m 6 minute walk (5/21): 229 m 6 minute walk (12/21): 52 m  PMH: 1. GERD 2. Pulmonary hypertension: Suspect Group 5 PH related to sarcoidosis.  - CTA chest (8/17) with no PE but mediastinal/hilar LAN, cystic lung changes, subpleural consolidation, patchy ground glass.  - Echo (8/17): EF 55-60%, D-shaped interventricular septum, PASP 60 mmHg. - ANA negative, ESR 112, anti-dsDNA negative, anti-CCP negative, RF < 14.  - RHC (10/17):  mean RA 3, PA 59/22 mean 36, mean PCWP 5, CI 2.92, PVR 7.4 WU.  - PFTs (10/17): Restrictive.  - RHC (4/18): mean RA 4, PA mean 32, mean PCWP 8, CI 2.9, PVR 5.2 WU.  - Did not tolerate selexipag.  - Echo (5/19): EF 60-65%, mild LVH, mild MR, RV normal size and systolic function, PASP 37 mmHg.  - Echo (8/20): EF 55-60%, normal RV size and systolic function, PASP 40 mmHg, IVC normal - Echo (8/21): EF 60-65% with normal RV, PASP 31 mmHg.   3. Sarcoidosis: Diagnosed with ocular sarcoidosis in 1997. Significant pulmonary involvement noted in 8/17.  Lung biopsy  positive.  - Cardiac MRI (5/18): EF 66%, normal RV size with low normal RV systolic function (EF 49%), no LGE (no evidence for cardiac sarcoidosis).  - CT chest 3/19 with stable pulmonary fibrotic changes related to sarcoidosis.  - PFTs (4/19): FVC 66%, FEV1 69%, ratio 103%, DLCO 37%, TLC 62% => moderate -severe mixed obstruction/restriction.  4. Chronic hypoxemic respiratory failure: Home oxygen 5. Childhood asthma. 6. CAD: LHC (4/18) with 80% LAD stenosis => DES to LAD.   Current Outpatient Medications  Medication Sig Dispense Refill  . ADCIRCA 20 MG tablet TAKE 2 TABLETS BY MOUTH ONCE DAILY. CALL [phone] TO REFILL 60 tablet 2  . albuterol (VENTOLIN HFA) 108 (90 Base) MCG/ACT inhaler Inhale 2 puffs into the lungs every 4 (four) hours as needed for wheezing or shortness of breath. 18 g 2  . aspirin 81 MG tablet Take 1 tablet (81 mg total) by mouth daily. 30 tablet 0  . atorvastatin (LIPITOR) 80 MG tablet Take 1 tablet (80 mg total) by mouth daily. 90 tablet 3  . budesonide-formoterol (SYMBICORT) 160-4.5 MCG/ACT inhaler Inhale 2 puffs into the lungs 2 (two) times daily. 2 Inhaler 0  . cetirizine (ZYRTEC) 10 MG tablet TAKE 1 TABLET BY MOUTH EVERY DAY 90 tablet 0  . esomeprazole (NEXIUM) 40 MG capsule Take 1 capsule (40 mg total) by mouth daily. 30 capsule 5  . ferrous sulfate 325 (65 FE) MG tablet TAKE 1 TABLET (325 MG TOTAL) BY MOUTH 2 (TWO) TIMES DAILY WITH A MEAL. 60 tablet 5  . fluticasone (FLONASE) 50 MCG/ACT nasal spray Place 2 sprays into both nostrils daily.    [name] guaiFENesin (MUCINEX) 600 MG 12 hr tablet Take 600 mg by mouth 2 (two) times daily as needed.     [name] ipratropium-albuterol (DUONEB) 0.5-2.5 (3) MG/3ML SOLN TAKE 3 MLS BY NEBULIZATION EVERY 6 (SIX) HOURS AS NEEDED. 360 mL 11  . LETAIRIS 10 MG tablet TAKE 1 TABLET BY MOUTH ONCE DAILY 30 tablet 11  . OXYGEN 2-3 lpm 24/7  AHC    . predniSONE (DELTASONE) 10 MG tablet Take 1 tablet (10 mg total) by mouth daily with breakfast. 30  tablet 2  . Treprostinil (TYVASO) 0.6 MG/ML SOLN Inhale 18 mcg into the lungs 4 (four) times daily. 10 mL 3   No current facility-administered medications for this encounter.    Allergies:   Patient has no known allergies.   Social History:  The patient  reports that she quit smoking about 9 years ago. Her smoking use included cigarettes. She has a 2.50 pack-year smoking history. She has never used smokeless tobacco. She reports that she does not drink alcohol and does not use drugs.   Family History:  The patient's family history includes Hypertension in her father and mother; Sarcoidosis in her maternal aunt.   ROS:  Please see the history  of present illness.   All other systems are personally reviewed and negative.   Exam:   BP 138/82   Pulse (!) 114   Wt 75.4 kg (166 lb 3.2 oz)   SpO2 100% Comment: 3 l n/c  BMI 32.46 kg/m  General: NAD Neck: No JVD, no thyromegaly or thyroid nodule.  Lungs: Bilateral rhonchi.  CV: Nondisplaced PMI.  Heart regular S1/S2, no S3/S4, no murmur.  No peripheral edema.  No carotid bruit.  Normal pedal pulses.  Abdomen: Soft, nontender, no hepatosplenomegaly, no distention.  Skin: Intact without lesions or rashes.  Neurologic: Alert and oriented x 3.  Psych: Normal affect. Extremities: No clubbing or cyanosis.  HEENT: Normal.   Recent Labs: 10/07/2019: ALT 21 03/07/2020: B Natriuretic Peptide 96.1; BUN 13; Creatinine, Ser 0.91; Potassium 3.9; Sodium 137  Personally reviewed   Wt Readings from Last 3 Encounters:  03/07/20 75.4 kg (166 lb 3.2 oz)  07/31/19 77.7 kg (171 lb 3.2 oz)  03/16/19 74.4 kg (164 lb)      ASSESSMENT AND PLAN:  1. Sarcoidosis: Ocular and lung involvement, biopsy-proven.  Cardiac MRI in 5/18 did not show evidence for cardiac sarcoidosis.  She is on prednisone and home oxygen. She has been evaluated at [facility] for lung transplant, this is currently on hold as she had clinically improved on PH meds.  Ups and downs with breathing  recently, has not been able to wean off prednisone.  - Follows with Dr. [name].  Continue prednisone per pulmonary.  2. Pulmonary hypertension: Pulmonary arterial hypertension. Suspect mixed group 1 and group 5 PH. CTA chest showed no PE but did show changes of sarcoidosis.  PFTs were restrictive.  Echo showed RV strain and elevated PA pressure.  PAH was confirmed by Erin Zavala in 10/17 with PVR 7.4 WU.  Serologic workup was negative.  [place] in 4/18 showed mild to moderate residual pulmonary hypertension. She has felt like generic tadalafil has not been as effective as brand name Adcirca.  She is tolerating Tyvaso (unable to tolerate Malvin Johns).  Echo today showed normal-appearing RV and PASP 31 mmHg.  6 minute walk was improved today.  She is not volume overloaded on exam. .  - Continue ambrisentan.   - Continue brand-name Adcirca 40 mg daily.  - Continue Tyvaso.   - Check BNP today.  3. CAD: Found incidentally on cath 4/18 done as part of workup for lung transplantation.  80% LAD stenosis, treated with DES to LAD.   No chest pain.  - Continue ASA 81 mg daily.   - Continue atorvastatin 80 mg daily, good LDL in 7/21.  4. I recommended that she get the COVID-19 vaccination.   Recommended follow-up:  3 months  Signed, Loralie Champagne, MD  03/07/2020  [facility] [address] [facility] [place] [address] [phone] (office) [phone] (fax)

## 2020-03-07 NOTE — Patient Instructions (Addendum)
Labs done today, your results will be available in MyChart, we will contact you for abnormal readings.  Your physician recommends that you schedule a follow-up appointment in: 3 months  If you have any questions or concerns before your next appointment please send [place] a message through [place] or call our office at [phone].    TO LEAVE A MESSAGE FOR THE NURSE SELECT OPTION 2, PLEASE LEAVE A MESSAGE INCLUDING:  YOUR NAME  DATE OF BIRTH  CALL BACK NUMBER  REASON FOR CALL**this is important as we prioritize the call backs  YOU WILL RECEIVE A CALL BACK THE SAME DAY AS LONG AS YOU CALL BEFORE 4:00 PM

## 2020-03-12 ENCOUNTER — Other Ambulatory Visit: Payer: Self-pay | Admitting: Pulmonary Disease

## 2020-03-12 DIAGNOSIS — J209 Acute bronchitis, unspecified: Secondary | ICD-10-CM

## 2020-03-14 ENCOUNTER — Telehealth: Payer: Self-pay | Admitting: Emergency Medicine

## 2020-03-14 MED ORDER — PREDNISONE 10 MG PO TABS: 10.0000 mg | ORAL_TABLET | Freq: Every day | ORAL | 0 refills | Status: DC

## 2020-03-14 NOTE — Telephone Encounter (Signed)
Refill has been sent to the pharmacy for the prednisone with no additional refills.

## 2020-03-21 ENCOUNTER — Ambulatory Visit: Payer: Medicare Other | Admitting: Emergency Medicine

## 2020-04-11 ENCOUNTER — Ambulatory Visit: Payer: Medicare Other | Admitting: Emergency Medicine

## 2020-04-18 ENCOUNTER — Telehealth (HOSPITAL_COMMUNITY): Payer: Self-pay | Admitting: Pharmacy Technician

## 2020-04-18 NOTE — Telephone Encounter (Signed)
Received a letter from [place] stating that the patient's eligibility has ended.  Called and spoke with [employer] who transferred me to [facility] [phone]), the patient has been contacted several times and they sent an application for re-enrollment to her via mail.  I requested that a copy of the application be sent to the office. The representative informed me that the patient would need to provide POI in addition to the application in order to be assessed for assistance.  Spoke with patient, she thinks she sent in paperwork for her Tyvaso, but she has yet to hear anything about it. We will get her application from [employer] filled out and I will check the status of her Tyvaso and call her back with an update. She will come sign the application and bring in POI after that.

## 2020-04-25 NOTE — Telephone Encounter (Signed)
[facility] Patient Advocate Encounter   Patient was approved to receive Tyvaso ([facility]) and Adcirca ([facility]) from [employer]  Effective dates: 04/25/20 through 03/11/21  The patient would need to call each pharmacy if she is ready for a refill, according to [place] will reach out to the patient as some point, did not say when.  In order for the patient to get Letairis assistance she would need to sign the application that was sent to the office by [facility] and then would need to provide POI.  Called and left the patient a voicemail with this information.

## 2020-04-29 ENCOUNTER — Other Ambulatory Visit: Payer: Self-pay | Admitting: Emergency Medicine

## 2020-05-06 ENCOUNTER — Other Ambulatory Visit (HOSPITAL_COMMUNITY): Payer: Self-pay | Admitting: Cardiology

## 2020-05-11 ENCOUNTER — Ambulatory Visit (INDEPENDENT_AMBULATORY_CARE_PROVIDER_SITE_OTHER): Payer: Medicare Other | Admitting: Emergency Medicine

## 2020-05-11 ENCOUNTER — Encounter: Payer: Self-pay | Admitting: Emergency Medicine

## 2020-05-11 ENCOUNTER — Other Ambulatory Visit: Payer: Self-pay

## 2020-05-11 DIAGNOSIS — J9611 Chronic respiratory failure with hypoxia: Secondary | ICD-10-CM

## 2020-05-11 DIAGNOSIS — R0981 Nasal congestion: Secondary | ICD-10-CM

## 2020-05-11 DIAGNOSIS — D860 Sarcoidosis of lung: Secondary | ICD-10-CM | POA: Diagnosis not present

## 2020-05-11 DIAGNOSIS — I272 Pulmonary hypertension, unspecified: Secondary | ICD-10-CM

## 2020-05-11 DIAGNOSIS — K219 Gastro-esophageal reflux disease without esophagitis: Secondary | ICD-10-CM | POA: Diagnosis not present

## 2020-05-11 DIAGNOSIS — J449 Chronic obstructive pulmonary disease, unspecified: Secondary | ICD-10-CM | POA: Diagnosis not present

## 2020-05-11 MED ORDER — ESOMEPRAZOLE MAGNESIUM 40 MG PO CPDR: 40.0000 mg | DELAYED_RELEASE_CAPSULE | Freq: Every day | ORAL | 5 refills | Status: DC

## 2020-05-11 MED ORDER — CETIRIZINE HCL 10 MG PO TABS: 10.0000 mg | ORAL_TABLET | Freq: Every day | ORAL | 3 refills | Status: DC

## 2020-05-11 MED ORDER — FLUTICASONE PROPIONATE 50 MCG/ACT NA SUSP: 2.0000 | Freq: Every day | NASAL | 2 refills | Status: DC

## 2020-05-11 NOTE — Progress Notes (Signed)
Subjective:    Patient ID: Erin Zavala, female    DOB: 1966-03-06, 55 y.o.   MRN: [id number]  Shortness of Breath Pertinent negatives include no abdominal pain, chest pain, leg swelling, rhinorrhea or wheezing.   ROV 12/02/2018 --Erin Zavala is 53, follows up today for her sarcoidosis with associated abnormal CT scan of the chest, secondary pulmonary hypertension.  She is currently on prednisone at 5 alternated w 2.5 daily.  She was trying to get back to work, works at the [employer]. 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 Remains on adcirca, letaris, tyvaso. Remains on symbicort. Last Ct chest March 2020. She overall feels stable, better exercise tolerance. O2 is set on 2-3L/min, titrates w activity. Flu shot up to date. She is working on losing some weight - wants to exercise. She interested in Erin Zavala.   R OV 05/11/2020 --follow-up visit for 55 year old woman with sarcoidosis associated with ocular involvement interstitial changes by CT, secondary pulmonary hypertension.  She has been on chronic prednisone, previously on 2.5/5 mg alternated daily.  Her current dose is 34m, increased about a year ago. She was experiencing fatigue, increased nasal mucous, dyspnea. She has gained about 40 lbs since increasing it. Her last formal flare was in 12/21 PAH regimen includes Adcirca, Letairis, Tyvaso. Bronchodilator regimen Symbicort. She has albuterol nebs, uses a few times a week  On oxygen at 3 to 4 L/min High-resolution CT scan of the chest 06/03/2018 shows groundglass and some moderate interstitial changes and an atypical pattern with some traction bronchiectasis, apical predominant.   MDM: Reviewed cardiology notes from 03/07/2020   Review of Systems  Constitutional: Positive for unexpected weight change (WIth increased steroid dose). Negative for fatigue.  HENT: Negative for rhinorrhea and sinus pain.   Eyes: Negative for discharge and redness.   Respiratory: Positive for shortness of breath. Negative for cough, chest tightness and wheezing.   Cardiovascular: Negative for chest pain, palpitations and leg swelling.  Gastrointestinal: Negative for abdominal distention and abdominal pain.  Musculoskeletal: Negative for myalgias.  Neurological: Negative for light-headedness.     No Known Allergies   Outpatient Medications Prior to Visit  Medication Sig Dispense Refill  . ADCIRCA 20 MG tablet TAKE 2 TABLETS BY MOUTH ONCE DAILY. CALL [phone] REFILL 60 tablet 2  . albuterol (VENTOLIN HFA) 108 (90 Base) MCG/ACT inhaler Inhale 2 puffs into the lungs every 4 (four) hours as needed for wheezing or shortness of breath. 18 g 2  . aspirin 81 MG tablet Take 1 tablet (81 mg total) by mouth daily. 30 tablet 0  . atorvastatin (LIPITOR) 80 MG tablet Take 1 tablet (80 mg total) by mouth daily. 90 tablet 3  . budesonide-formoterol (SYMBICORT) 160-4.5 MCG/ACT inhaler Inhale 2 puffs into the lungs 2 (two) times daily. 2 Inhaler 0  . cetirizine (ZYRTEC) 10 MG tablet TAKE 1 TABLET BY MOUTH EVERY DAY 90 tablet 0  . esomeprazole (NEXIUM) 40 MG capsule Take 1 capsule (40 mg total) by mouth daily. 30 capsule 5  . ferrous sulfate 325 (65 FE) MG tablet TAKE 1 TABLET (325 MG TOTAL) BY MOUTH 2 (TWO) TIMES DAILY WITH A MEAL. 60 tablet 5  . fluticasone (FLONASE) 50 MCG/ACT nasal spray Place 2 sprays into both nostrils daily.    .Marland KitchenguaiFENesin (MUCINEX) 600 MG 12 hr tablet Take 600 mg by mouth 2 (two) times daily as needed.     .Marland Kitchenipratropium-albuterol (DUONEB) 0.5-2.5 (3)  MG/3ML SOLN TAKE 3 MLS BY NEBULIZATION EVERY 6 (SIX) HOURS AS NEEDED. 360 mL 11  . LETAIRIS 10 MG tablet TAKE 1 TABLET BY MOUTH ONCE DAILY 30 tablet 11  . OXYGEN 2-3 lpm 24/7  [facility]    . predniSONE (DELTASONE) 10 MG tablet TAKE 1 TABLET (10 MG TOTAL) BY MOUTH DAILY WITH BREAKFAST. 30 tablet 0  . Treprostinil (TYVASO) 0.6 MG/ML SOLN Inhale 18 mcg into the lungs 4 (four) times daily. 10 mL 3    No facility-administered medications prior to visit.        Objective:    Vitals:   05/11/20 0903  BP: 118/70  Pulse: 90  Temp: 98 F (36.7 C)  TempSrc: Temporal  SpO2: 100%  Weight: 175 lb 12.8 oz (79.7 kg)  Height: 5' (1.524 m)   Gen: Pleasant, well-nourished, in no distress,  normal affect  ENT: No lesions,  mouth clear,  oropharynx clear, no postnasal drip  Neck: No JVD, no stridor  Lungs: No use of accessory muscles, no crackles or wheezing on normal respiration, no wheeze on forced expiration  Cardiovascular: RRR, heart sounds normal, no murmur or gallops, no peripheral edema  Musculoskeletal: No deformities, no cyanosis or clubbing  Neuro: alert, awake, non focal  Skin: Warm, no lesions or rash     Assessment & Plan:  COPD (chronic obstructive pulmonary disease) (HCC) Please continue Symbicort 2 puffs twice a day.  Rinse and gargle after using. Keep your albuterol nebulizer available use 1 treatment up to every 4 hours if needed for shortness of breath, chest tightness, wheezing.   Chronic respiratory failure with hypoxia (HCC) Currently using 3 to 4 L/min and following her saturations  Pulmonary sarcoidosis (HCC) We will decrease your prednisone to 15m daily We will repeat your high resolution CT chest to follow your sarcoidosis Strongly recommend that you get the COVID-19 vaccine.  This will decrease your risk for severe disease if you were to contract the virus. Follow with Erin Zavala 1 month  GERD (gastroesophageal reflux disease) Nexium as ordered  Sinus congestion Zyrtec, Flonase as ordered  Pulmonary hypertension ([place] Please continue your Erin Zavala Adcirca as you have been taking She may benefit from cardiopulmonary rehab going forward.

## 2020-05-11 NOTE — Assessment & Plan Note (Signed)
Currently using 3 to 4 L/min and following her saturations

## 2020-05-11 NOTE — Addendum Note (Signed)
Addended by: Dierdre Highman on: 05/11/2020 09:43 AM   Modules accepted: Orders

## 2020-05-11 NOTE — Assessment & Plan Note (Signed)
Zyrtec, Flonase as ordered

## 2020-05-11 NOTE — Assessment & Plan Note (Signed)
Please continue Symbicort 2 puffs twice a day.  Rinse and gargle after using. Keep your albuterol nebulizer available use 1 treatment up to every 4 hours if needed for shortness of breath, chest tightness, wheezing.

## 2020-05-11 NOTE — Patient Instructions (Addendum)
We will decrease your prednisone to 40m daily We will repeat your high resolution CT chest to follow your sarcoidosis Please continue Symbicort 2 puffs twice a day.  Rinse and gargle after using. Keep your albuterol nebulizer available use 1 treatment up to every 4 hours if needed for shortness of breath, chest tightness, wheezing. Please continue your LVida Roller Adcirca as you have been taking Please continue Zyrtec, Flonase as you have been taking them Continue Nexium as you have been taking Please continue your oxygen 3 to 4 L/min with exertion and at rest if needed keep saturation greater than 90% Agree with efforts to work on exercise and diet to allow weight loss. Strongly recommend that you get the COVID-19 vaccine.  This will decrease your risk for severe disease if you were to contract the virus. Follow with Dr BLamonte Sakaiin 1 month

## 2020-05-11 NOTE — Assessment & Plan Note (Signed)
We will decrease your prednisone to 73m daily We will repeat your high resolution CT chest to follow your sarcoidosis Strongly recommend that you get the COVID-19 vaccine.  This will decrease your risk for severe disease if you were to contract the virus. Follow with Dr BLamonte Sakaiin 1 month

## 2020-05-11 NOTE — Assessment & Plan Note (Signed)
Please continue your Larena Sox as you have been taking She may benefit from cardiopulmonary rehab going forward.

## 2020-05-11 NOTE — Assessment & Plan Note (Signed)
Nexium as ordered

## 2020-05-18 ENCOUNTER — Other Ambulatory Visit (HOSPITAL_COMMUNITY): Payer: Self-pay

## 2020-05-18 MED ORDER — ATORVASTATIN CALCIUM 80 MG PO TABS: 80.0000 mg | ORAL_TABLET | Freq: Every day | ORAL | 3 refills | Status: DC

## 2020-05-19 ENCOUNTER — Other Ambulatory Visit (HOSPITAL_COMMUNITY): Payer: Self-pay

## 2020-05-19 MED ORDER — ATORVASTATIN CALCIUM 80 MG PO TABS: 80.0000 mg | ORAL_TABLET | Freq: Every day | ORAL | 3 refills | Status: DC

## 2020-05-23 NOTE — Telephone Encounter (Signed)
I have spoken with Erin Zavala and the patient several times. The patient states that she sent in her application for Letairis assistance some time ago.  [employer] stated that if the patient did not call and make contact with them by 3/13 then her referral for Letairis would be inactivated. They have made over 10 outbound calls to the patient.  Spoke with the patient last Friday, she stated that although she had not spoken to the company, she did send the paperwork in. Called [employer] today, the patient called in on Friday and requested that an application be sent to her. The representative stated that the application was faxed and mailed to the patient.

## 2020-05-28 ENCOUNTER — Other Ambulatory Visit: Payer: Self-pay | Admitting: Emergency Medicine

## 2020-06-07 ENCOUNTER — Other Ambulatory Visit: Payer: Self-pay

## 2020-06-07 ENCOUNTER — Encounter (HOSPITAL_COMMUNITY): Payer: Self-pay | Admitting: Cardiology

## 2020-06-07 ENCOUNTER — Telehealth (HOSPITAL_COMMUNITY): Payer: Self-pay | Admitting: Pharmacy Technician

## 2020-06-07 ENCOUNTER — Ambulatory Visit (HOSPITAL_COMMUNITY)
Admission: RE | Admit: 2020-06-07 | Discharge: 2020-06-07 | Disposition: A | Discharge status: Home / Self Care | Payer: 59 | Source: Ambulatory Visit | Attending: Cardiology | Admitting: Cardiology

## 2020-06-07 VITALS — BP 130/70 | HR 89 | Wt 172.8 lb

## 2020-06-07 DIAGNOSIS — I272 Pulmonary hypertension, unspecified: Secondary | ICD-10-CM

## 2020-06-07 DIAGNOSIS — Z9981 Dependence on supplemental oxygen: Secondary | ICD-10-CM | POA: Insufficient documentation

## 2020-06-07 DIAGNOSIS — I2721 Secondary pulmonary arterial hypertension: Secondary | ICD-10-CM | POA: Insufficient documentation

## 2020-06-07 DIAGNOSIS — Z8249 Family history of ischemic heart disease and other diseases of the circulatory system: Secondary | ICD-10-CM | POA: Insufficient documentation

## 2020-06-07 DIAGNOSIS — Z87891 Personal history of nicotine dependence: Secondary | ICD-10-CM | POA: Insufficient documentation

## 2020-06-07 DIAGNOSIS — Z79899 Other long term (current) drug therapy: Secondary | ICD-10-CM | POA: Diagnosis not present

## 2020-06-07 DIAGNOSIS — Z7982 Long term (current) use of aspirin: Secondary | ICD-10-CM | POA: Insufficient documentation

## 2020-06-07 DIAGNOSIS — D869 Sarcoidosis, unspecified: Secondary | ICD-10-CM | POA: Diagnosis present

## 2020-06-07 DIAGNOSIS — Z7952 Long term (current) use of systemic steroids: Secondary | ICD-10-CM | POA: Diagnosis not present

## 2020-06-07 DIAGNOSIS — I2510 Atherosclerotic heart disease of native coronary artery without angina pectoris: Secondary | ICD-10-CM | POA: Insufficient documentation

## 2020-06-07 DIAGNOSIS — Z955 Presence of coronary angioplasty implant and graft: Secondary | ICD-10-CM | POA: Insufficient documentation

## 2020-06-07 DIAGNOSIS — D860 Sarcoidosis of lung: Secondary | ICD-10-CM

## 2020-06-07 DIAGNOSIS — J449 Chronic obstructive pulmonary disease, unspecified: Secondary | ICD-10-CM

## 2020-06-07 LAB — BRAIN NATRIURETIC PEPTIDE: B Natriuretic Peptide: 41.6 pg/mL (ref 0.0–100.0)

## 2020-06-07 LAB — LIPID PANEL
Cholesterol: 145 mg/dL (ref 0–200)
HDL: 52 mg/dL (ref >40)
LDL Cholesterol: 76 mg/dL (ref 0–99)
Total CHOL/HDL Ratio: 2.8 RATIO
Triglycerides: 85 mg/dL (ref <150)
VLDL: 17 mg/dL (ref 0–40)

## 2020-06-07 NOTE — Progress Notes (Signed)
Date:  06/07/2020   ID:  Erin Zavala, DOB [birth year], MRN [id number]  Provider location: [place] [facility] Type of Visit: Established patient  PCP:  Patient, No Pcp Per (Inactive)  Cardiologist: Dr. [name]   History of Present Illness: Erin Zavala is a 55 y.o. female who has history of sarcoidosis, chronic hypoxemic respiratory failure on home oxygen, and pulmonary hypertension.  She was diagnosed with ocular sarcoidosis in 1997. It seems like she did not have significant lung complications (that were recognized at least) until early 2017.  She now requires home oxygen.  CTA chest shows changes consistent with sarcoidosis, this has been confirmed by biopsy.  Echo in 8/17 had evidence for RV strain and elevated PA pressure.  [place] in 10/17 confirmed pulmonary arterial hypertension with normal right and left heart filling pressures.    She has started on macitentan followed by Adcirca and most recently Tyvaso.  She did not tolerate selexipag well. She feels like these medications have helped her breathing.    She went to [facility] for transplant evaluation in 4/18.  As part of that evaluation, she had right and left heart cath.  Surprisingly, given age and lack of family history, she had an 80% LAD stenosis.  This was treated with DES.  RHC showed mild to moderate pulmonary hypertension. Transplant evaluation is currently on hold as she has been doing better symptomatically.   Echo in 8/20 showed EF 55-60%, normal RV, PASP 40 mmHg.  Echo in 8/21 showed EF 60-65% with normal RV, PASP 31 mmHg.    She returns for followup of pulmonary hypertension.  She is using 1 L oxygen by [place] at rest, 3 L with exertion.  She is down to 5 mg prednisone.  Breathing stable to improved.  No dyspnea walking on flat ground. Not having to use prn albuterol.  Weight up about 6 lbs.  No chest pain.  She is interested in doing pulmonary rehab.   Labs (9/17): NA negative, ESR 112, anti-dsDNA negative,  anti-CCP negative, RF < 14.  ACE 136.  Labs (10/17): K 4.1, creatinine 0.8 Labs (11/17): K 3.8, creatinine 0.91 Labs (3/18): BNP 16 Labs (5/18): K 4.6, creatinine 0.74 Labs (7/18): creatinine 0.79 Labs (9/18): K 4.3, creatinine 0.76 Labs (10/18): LDL 62, HDL 40 Labs (12/19): K 3.6, creatinine 0.73 Labs (1/20): BNP 17.7 Labs (3/20): LDL 67, HDL 65, TGs 185  Labs (8/20): BNP 53 Labs (1/21): K 3.9, creatinine 0.93, BNP 51, LDL 117 Labs (7/21): LDL 62, HDL 60 Labs (12/21): BNP 96, K 3.9, creatinine 0.91  6 minute walk (10/17): 305 m.  6 minute walk (1/18): 305 m 6 minute walk (3/18): 270 m 6 minute walk (6/18): 302 m 6 minute walk (9/18): 396 m 6 minute walk (2/19): 213 m (could have kept going but was stopped due to decreased oxygen saturation).  6 minute walk (4/19): 396 m 6 minute walk (7/19): 381 m 6 minute walk (8/20) 359 m 6 minute walk (1/21): 289 m 6 minute walk (5/21): 229 m 6 minute walk (12/21): 32 m  PMH: 1. GERD 2. Pulmonary hypertension: Suspect Group 5 PH related to sarcoidosis.  - CTA chest (8/17) with no PE but mediastinal/hilar LAN, cystic lung changes, subpleural consolidation, patchy ground glass.  - Echo (8/17): EF 55-60%, D-shaped interventricular septum, PASP 60 mmHg. - ANA negative, ESR 112, anti-dsDNA negative, anti-CCP negative, RF < 14.  - RHC (10/17): mean RA 3, PA 59/22 mean 36, mean  PCWP 5, CI 2.92, PVR 7.4 WU.  - PFTs (10/17): Restrictive.  - RHC (4/18): mean RA 4, PA mean 32, mean PCWP 8, CI 2.9, PVR 5.2 WU.  - Did not tolerate selexipag.  - Echo (5/19): EF 60-65%, mild LVH, mild MR, RV normal size and systolic function, PASP 37 mmHg.  - Echo (8/20): EF 55-60%, normal RV size and systolic function, PASP 40 mmHg, IVC normal - Echo (8/21): EF 60-65% with normal RV, PASP 31 mmHg.   3. Sarcoidosis: Diagnosed with ocular sarcoidosis in 1997. Significant pulmonary involvement noted in 8/17.  Lung biopsy positive.  - Cardiac MRI (5/18): EF 66%,  normal RV size with low normal RV systolic function (EF 31%), no LGE (no evidence for cardiac sarcoidosis).  - CT chest 3/19 with stable pulmonary fibrotic changes related to sarcoidosis.  - PFTs (4/19): FVC 66%, FEV1 69%, ratio 103%, DLCO 37%, TLC 62% => moderate -severe mixed obstruction/restriction.  4. Chronic hypoxemic respiratory failure: Home oxygen 5. Childhood asthma. 6. CAD: LHC (4/18) with 80% LAD stenosis => DES to LAD.   Current Outpatient Medications  Medication Sig Dispense Refill  . ADCIRCA 20 MG tablet TAKE 2 TABLETS BY MOUTH ONCE DAILY. CALL [phone] TO REFILL 60 tablet 2  . albuterol (VENTOLIN HFA) 108 (90 Base) MCG/ACT inhaler Inhale 2 puffs into the lungs every 4 (four) hours as needed for wheezing or shortness of breath. 18 g 2  . aspirin 81 MG tablet Take 1 tablet (81 mg total) by mouth daily. 30 tablet 0  . atorvastatin (LIPITOR) 80 MG tablet Take 1 tablet (80 mg total) by mouth daily. 90 tablet 3  . budesonide-formoterol (SYMBICORT) 160-4.5 MCG/ACT inhaler Inhale 2 puffs into the lungs 2 (two) times daily. 2 Inhaler 0  . cetirizine (ZYRTEC) 10 MG tablet Take 1 tablet (10 mg total) by mouth daily. 90 tablet 3  . esomeprazole (NEXIUM) 40 MG capsule Take 1 capsule (40 mg total) by mouth daily. 30 capsule 5  . ferrous sulfate 325 (65 FE) MG tablet TAKE 1 TABLET (325 MG TOTAL) BY MOUTH 2 (TWO) TIMES DAILY WITH A MEAL. 60 tablet 5  . fluticasone (FLONASE) 50 MCG/ACT nasal spray Place 2 sprays into both nostrils daily. 16 g 2  . guaiFENesin (MUCINEX) 600 MG 12 hr tablet Take 600 mg by mouth 2 (two) times daily as needed.     [name] ipratropium-albuterol (DUONEB) 0.5-2.5 (3) MG/3ML SOLN TAKE 3 MLS BY NEBULIZATION EVERY 6 (SIX) HOURS AS NEEDED. 360 mL 11  . LETAIRIS 10 MG tablet TAKE 1 TABLET BY MOUTH ONCE DAILY 30 tablet 11  . OXYGEN 2-3 lpm 24/7  AHC    . predniSONE (DELTASONE) 10 MG tablet Take 5 mg by mouth daily with breakfast.    . Treprostinil (TYVASO) 0.6 MG/ML SOLN  Inhale 18 mcg into the lungs 4 (four) times daily. 10 mL 3   No current facility-administered medications for this encounter.    Allergies:   Patient has no known allergies.   Social History:  The patient  reports that she quit smoking about 9 years ago. Her smoking use included cigarettes. She has a 2.50 pack-year smoking history. She has never used smokeless tobacco. She reports that she does not drink alcohol and does not use drugs.   Family History:  The patient's family history includes Hypertension in her father and mother; Sarcoidosis in her maternal aunt.   ROS:  Please see the history of present illness.   All other systems are  personally reviewed and negative.   Exam:   BP 130/70   Pulse 89   Wt 78.4 kg (172 lb 12.8 oz)   SpO2 99% Comment: 2L n/c  BMI 33.75 kg/m  General: NAD Neck: No JVD, no thyromegaly or thyroid nodule.  Lungs: Occasional wheeze heard.  CV: Nondisplaced PMI.  Heart regular S1/S2, no S3/S4, no murmur.  No peripheral edema.  No carotid bruit.  Normal pedal pulses.  Abdomen: Soft, nontender, no hepatosplenomegaly, no distention.  Skin: Intact without lesions or rashes.  Neurologic: Alert and oriented x 3.  Psych: Normal affect. Extremities: No clubbing or cyanosis.  HEENT: Normal.   Recent Labs: 10/07/2019: ALT 21 03/07/2020: BUN 13; Creatinine, Ser 0.91; Potassium 3.9; Sodium 137 06/07/2020: B Natriuretic Peptide 41.6  Personally reviewed   Wt Readings from Last 3 Encounters:  06/07/20 78.4 kg (172 lb 12.8 oz)  05/11/20 79.7 kg (175 lb 12.8 oz)  03/07/20 75.4 kg (166 lb 3.2 oz)      ASSESSMENT AND PLAN:  1. Sarcoidosis: Ocular and lung involvement, biopsy-proven.  Cardiac MRI in 5/18 did not show evidence for cardiac sarcoidosis.  She is on prednisone and home oxygen. She has been evaluated at [facility] for lung transplant, this is currently on hold as she had clinically improved on PH meds.  Ups and downs with breathing recently, has not been able  to wean off prednisone though down to 5 mg daily.  - Follows with Dr. [name].  Continue prednisone per pulmonary.  2. Pulmonary hypertension: Pulmonary arterial hypertension. Suspect mixed group 1 and group 5 PH. CTA chest showed no PE but did show changes of sarcoidosis.  PFTs were restrictive.  Echo showed RV strain and elevated PA pressure.  PAH was confirmed by [place] in 10/17 with PVR 7.4 WU.  Serologic workup was negative.  [place] in 4/18 showed mild to moderate residual pulmonary hypertension. She has felt like generic tadalafil has not been as effective as brand name Adcirca.  She is tolerating Tyvaso (unable to tolerate Malvin Johns).  Echo 1821 showed normal-appearing RV and PASP 31 mmHg. She is not volume overloaded on exam. .  - Continue ambrisentan.   - Continue brand-name Adcirca 40 mg daily.  - Continue Tyvaso.   - Check BNP today.  - I will refer for pulmonary rehab.  3. CAD: Found incidentally on cath 4/18 done as part of workup for lung transplantation.  80% LAD stenosis, treated with DES to LAD.   No chest pain.  - Continue ASA 81 mg daily.   - Continue atorvastatin 80 mg daily, check LDL today.  4. I recommended again that she get the COVID-19 vaccination.   Recommended follow-up:  3 months  Signed, Loralie Champagne, MD  06/07/2020  [place] [address] [facility] and [place] [address] [phone] (office) [phone] (fax)

## 2020-06-07 NOTE — Patient Instructions (Signed)
Labs done today. We will contact you only if your labs are abnormal.  No medication changes were made. Please continue all current medications as prescribed.  You have been referred to [facility] Pulmonary Rehab. They will contact you to schedule an appointment.   Your physician recommends that you schedule a follow-up appointment in: 3 months with Dr. [name]  If you have any questions or concerns before your next appointment please send [place] a message through [facility] or call our office at [phone].    TO LEAVE A MESSAGE FOR THE NURSE SELECT OPTION 2, PLEASE LEAVE A MESSAGE INCLUDING: . YOUR NAME . DATE OF BIRTH . CALL BACK NUMBER . REASON FOR CALL**this is important as we prioritize the call backs  YOU WILL RECEIVE A CALL BACK THE SAME DAY AS LONG AS YOU CALL BEFORE 4:00 PM   Do the following things EVERYDAY: 1) Weigh yourself in the morning before breakfast. Write it down and keep it in a log. 2) Take your medicines as prescribed 3) Eat low salt foods--Limit salt (sodium) to 2000 mg per day.  4) Stay as active as you can everyday 5) Limit all fluids for the day to less than 2 liters   At the [facility], you and your health needs are our priority. As part of our continuing mission to provide you with exceptional heart care, we have created designated Provider Care Teams. These Care Teams include your primary Cardiologist (physician) and Advanced Practice Providers (APPs- Physician Assistants and Nurse Practitioners) who all work together to provide you with the care you need, when you need it.   You may see any of the following providers on your designated Care Team at your next follow up: [name] Dr Glori Bickers . Dr Loralie Champagne . Darrick Grinder, NP . Lyda Jester, PA . Audry Riles, PharmD   Please be sure to bring in all your medications bottles to every appointment.

## 2020-06-07 NOTE — Telephone Encounter (Signed)
Advanced Heart Failure Patient Advocate Encounter   Patient was approved to receive Letairis from [place]  Patient ID: [id number] Effective dates: 05/30/20 through 03/11/21  Called and left the patient message.   Charlann Boxer, CPhT

## 2020-06-09 ENCOUNTER — Encounter (HOSPITAL_COMMUNITY): Payer: Self-pay | Admitting: *Deleted

## 2020-06-09 NOTE — Progress Notes (Signed)
Received referral from Dr. Rodena Medin this pt to participate in pulmonary rehab with the the diagnosis of Pulmonary Sarcoidosis and Pulmonary Hypertension.  Pt was referred to pulmonary rehab in 2021 by her pulmonologist Dr. [name]. Unable to make contact for rescheduling her missed orientation appt . Clinical review of pt follow up appt on 3/29  heart Failure clinic  note.  Pt with Covid Risk Score - 3. Pt appropriate for scheduling for Pulmonary rehab.  Will forward to support staff for scheduling and verification of insurance eligibility/benefits with pt consent. Cherre Huger, BSN Cardiac and Training and development officer

## 2020-06-13 ENCOUNTER — Telehealth: Payer: Self-pay | Admitting: Emergency Medicine

## 2020-06-13 NOTE — Telephone Encounter (Signed)
Yes, video visit preferred, but phone visit ok

## 2020-06-13 NOTE — Telephone Encounter (Signed)
Called and spoke with Patient. Patient understands how to do a my chart video visit and has computer access. OV changed to 06/16/20 at 0915, my chart visit. Nothing further at this time.

## 2020-06-13 NOTE — Telephone Encounter (Signed)
Called and spoke with Patient. Patient stated she is scheduled 06/16/20, with Dr. [name] for a floow up visit. Patient stated she is having car issues this week and was wanting to know if Dr. [name] is ok with a telephone visit?  Message routed to Dr. [name] to advise on OV

## 2020-06-16 ENCOUNTER — Encounter: Payer: Self-pay | Admitting: Emergency Medicine

## 2020-06-16 ENCOUNTER — Other Ambulatory Visit: Payer: Self-pay

## 2020-06-16 ENCOUNTER — Telehealth (INDEPENDENT_AMBULATORY_CARE_PROVIDER_SITE_OTHER): Payer: Medicare Other | Admitting: Emergency Medicine

## 2020-06-16 ENCOUNTER — Ambulatory Visit: Payer: Medicare Other | Admitting: Emergency Medicine

## 2020-06-16 DIAGNOSIS — J449 Chronic obstructive pulmonary disease, unspecified: Secondary | ICD-10-CM | POA: Diagnosis not present

## 2020-06-16 DIAGNOSIS — I272 Pulmonary hypertension, unspecified: Secondary | ICD-10-CM | POA: Diagnosis not present

## 2020-06-16 DIAGNOSIS — D860 Sarcoidosis of lung: Secondary | ICD-10-CM

## 2020-06-16 DIAGNOSIS — J309 Allergic rhinitis, unspecified: Secondary | ICD-10-CM | POA: Insufficient documentation

## 2020-06-16 DIAGNOSIS — J9611 Chronic respiratory failure with hypoxia: Secondary | ICD-10-CM

## 2020-06-16 DIAGNOSIS — J00 Acute nasopharyngitis [common cold]: Secondary | ICD-10-CM | POA: Insufficient documentation

## 2020-06-16 DIAGNOSIS — K219 Gastro-esophageal reflux disease without esophagitis: Secondary | ICD-10-CM

## 2020-06-16 DIAGNOSIS — J301 Allergic rhinitis due to pollen: Secondary | ICD-10-CM

## 2020-06-16 MED ORDER — PREDNISONE 5 MG PO TABS: 2.5000 mg | ORAL_TABLET | Freq: Every day | ORAL | 11 refills | Status: DC

## 2020-06-16 NOTE — Assessment & Plan Note (Signed)
We will plan to get a high-resolution CT scan of her chest once I have her on her optimal, lowest prednisone dose

## 2020-06-16 NOTE — Assessment & Plan Note (Signed)
Plan to continue her current regimen, Letairis, Tyvaso, Adcirca.  She is going to participate in cardiopulmonary rehab which I think will be helpful.  Following with Dr. [name] for 6-minute walks, echocardiograms

## 2020-06-16 NOTE — Addendum Note (Signed)
Addended by: Lorretta Harp on: 06/16/2020 09:49 AM   Modules accepted: Orders

## 2020-06-16 NOTE — Assessment & Plan Note (Signed)
Associated with her sarcoidosis.  She tolerated the decrease in prednisone to 5 mg daily.  Were going to try to go to 2.5 mg daily.  If she has a clinical decline then we may be able to go to 2.5/5 mg every other day alternated.  Continue her Symbicort as ordered.  Minimal albuterol use.  Continue to control GERD and rhinitis which make her asthmatic COPD harder to manage

## 2020-06-16 NOTE — Assessment & Plan Note (Signed)
Continue her same Nexium.  She has modified her diet which is been beneficial

## 2020-06-16 NOTE — Assessment & Plan Note (Signed)
Continue Zyrtec.  She is going to increase her Flonase to twice daily through the spring allergy season.  She will also try starting nasal saline rinses

## 2020-06-16 NOTE — Assessment & Plan Note (Signed)
3-4 L/min.  I did explain to her she may have to go up on her oxygen when she participates with cardiopulmonary rehab.  They will be monitoring and will up titrate her

## 2020-06-16 NOTE — Progress Notes (Signed)
Virtual Visit via Video Note  I connected with Erin Zavala on 06/16/20 at  9:15 AM EDT by a video enabled telemedicine application and verified that I am speaking with the correct person using two identifiers.  Location: Patient: home Provider: office    I discussed the limitations of evaluation and management by telemedicine and the availability of in person appointments. The patient expressed understanding and agreed to proceed.  History of Present Illness: Follow-up visit for 55 year old woman with pulmonary sarcoidosis on prednisone 10 mg every day, associated interstitial lung disease, associated significant pulmonary hypertension for which she has been on Ralene Cork, Tyvaso.  She has chronic hypoxemia and is on oxygen at 3-4 L/min  Associated obstructive lung disease has been managed with Symbicort.  At her last visit we tried decreasing her prednisone to 5 mg daily.  She is remained on Erin Zavala, Erin Zavala and saw Dr. Aundra Zavala on 06/07/2020.  She was referred to cardiopulmonary rehab.   Observations/Objective: She reports that she has not really lost any ground since we decreased the prednisone to 75m.  She is having nasal; congestion, clear drainage. No real dyspnea or wheeze. No real cough. She is on zyrtec and flonase qd, is willing to try [place] Reflux well controlled on nexium and she has modified her diet.  She is not needing any albuterol lately.   Assessment and Plan: COPD (chronic obstructive pulmonary disease) ([place] Associated with her sarcoidosis.  She tolerated the decrease in prednisone to 5 mg daily.  Were going to try to go to 2.5 mg daily.  If she has a clinical decline then we may be able to go to 2.5/5 mg every other day alternated.  Continue her Symbicort as ordered.  Minimal albuterol use.  Continue to control GERD and rhinitis which make her asthmatic COPD harder to manage  Chronic respiratory failure with hypoxia (HCC) 3-4 L/min.  I did explain to her  she may have to go up on her oxygen when she participates with cardiopulmonary rehab.  They will be monitoring and will up titrate her  Pulmonary sarcoidosis ([facility] We will plan to get a high-resolution CT scan of her chest once I have her on her optimal, lowest prednisone dose  Pulmonary hypertension ([place] Plan to continue her current regimen, Letairis, Tyvaso, Adcirca.  She is going to participate in cardiopulmonary rehab which I think will be helpful.  Following with Dr. MAundra Dubinfor 6-minute walks, echocardiograms  GERD (gastroesophageal reflux disease) Continue her same Nexium.  She has modified her diet which is been beneficial  Allergic rhinitis Continue Zyrtec.  She is going to increase her Flonase to twice daily through the spring allergy season.  She will also try starting nasal saline rinses    Follow Up Instructions: 1 month    I discussed the assessment and treatment plan with the patient. The patient was provided an opportunity to ask questions and all were answered. The patient agreed with the plan and demonstrated an understanding of the instructions.   The patient was advised to call back or seek an in-person evaluation if the symptoms worsen or if the condition fails to improve as anticipated.  I provided 30 minutes of non-face-to-face time during this encounter.   RCollene Gobble MD

## 2020-07-14 ENCOUNTER — Telehealth (HOSPITAL_COMMUNITY): Payer: Self-pay

## 2020-07-14 NOTE — Telephone Encounter (Signed)
Called patient to see if she is interested in the Pulmonary Rehab Program. Patient expressed interest. Explained scheduling process and went over insurance, patient verbalized understanding. Also adv pt where we are with scheduling for PR and that we have a back log. (1-4 months)

## 2020-07-14 NOTE — Telephone Encounter (Signed)
Pt insurance is active and benefits verified through Medicare A/B. Co-pay $0.00, DED $233.00/$233.00 met, out of pocket $0.00/$0.00 met, co-insurance 20%. No pre-authorization required. 07/14/20 @ 12:07PM  Will contact patient to see if she is interested in the Pulmonary Rehab Program.

## 2020-07-22 ENCOUNTER — Ambulatory Visit: Payer: Medicare Other | Admitting: Emergency Medicine

## 2020-07-28 ENCOUNTER — Telehealth: Payer: Self-pay

## 2020-07-28 NOTE — Telephone Encounter (Signed)
LVMTCB r/t what type of O2 equipment she currently has in the home. If pt calls back could we please ask weather she had a POC or regular tanks.

## 2020-08-10 ENCOUNTER — Encounter (HOSPITAL_COMMUNITY): Payer: Self-pay

## 2020-08-10 ENCOUNTER — Telehealth (HOSPITAL_COMMUNITY): Payer: Self-pay

## 2020-08-10 NOTE — Telephone Encounter (Signed)
Attempted to call patient in regards to Pulmonary Rehab - LM on VM Mailed letter 

## 2020-08-24 ENCOUNTER — Encounter (HOSPITAL_COMMUNITY): Payer: Medicare Other | Admitting: Cardiology

## 2020-08-25 ENCOUNTER — Telehealth (HOSPITAL_COMMUNITY): Payer: Self-pay

## 2020-08-25 ENCOUNTER — Telehealth: Payer: Self-pay | Admitting: Emergency Medicine

## 2020-08-25 MED ORDER — AZITHROMYCIN 250 MG PO TABS: ORAL_TABLET | ORAL | 0 refills | Status: DC

## 2020-08-25 MED ORDER — PREDNISONE 20 MG PO TABS: 20.0000 mg | ORAL_TABLET | Freq: Every day | ORAL | 0 refills | Status: DC

## 2020-08-25 NOTE — Telephone Encounter (Signed)
Called and spoke with pt letting her know recs stated by RB and she verbalized understanding. Verified preferred pharmacy and sent meds in for her. Nothing further needed.

## 2020-08-25 NOTE — Telephone Encounter (Signed)
Called and spoke with patient. She stated that she has noticed an increase in SOB and increased productive cough for the past week. Her phlegm is thick and has ranged in color from pale yellow to green. She is still using 2L of O2 as well as her Symbicort 160 and DuoNeb. She last took a breathing treatment about 2 hours ago. She did a covid test at home and her results were negative. Also denied being around anyone who has been sick recently.   She believes it may be a sinus infection because she does have some pressure in between her eyes and around her nose.   Pharmacy is [facility].   RB, can you please advise? Thanks!

## 2020-08-25 NOTE — Telephone Encounter (Signed)
No response from pt.  Closed referral  

## 2020-08-25 NOTE — Telephone Encounter (Signed)
Please have her increase prednisone to 55m qd x 5 days and then go back to usual dose Have her take azithro Z pack

## 2020-09-01 ENCOUNTER — Telehealth: Payer: Self-pay | Admitting: Emergency Medicine

## 2020-09-01 DIAGNOSIS — I272 Pulmonary hypertension, unspecified: Secondary | ICD-10-CM

## 2020-09-01 DIAGNOSIS — J9611 Chronic respiratory failure with hypoxia: Secondary | ICD-10-CM

## 2020-09-01 NOTE — Telephone Encounter (Signed)
DME order placed for adapt

## 2020-09-02 ENCOUNTER — Other Ambulatory Visit (HOSPITAL_COMMUNITY): Payer: Self-pay | Admitting: Cardiology

## 2020-09-05 ENCOUNTER — Telehealth (INDEPENDENT_AMBULATORY_CARE_PROVIDER_SITE_OTHER): Payer: 59 | Admitting: Pulmonary Disease

## 2020-09-05 ENCOUNTER — Telehealth: Payer: Self-pay | Admitting: Emergency Medicine

## 2020-09-05 DIAGNOSIS — J441 Chronic obstructive pulmonary disease with (acute) exacerbation: Secondary | ICD-10-CM | POA: Diagnosis not present

## 2020-09-05 DIAGNOSIS — D860 Sarcoidosis of lung: Secondary | ICD-10-CM

## 2020-09-05 MED ORDER — PREDNISONE 5 MG PO TABS: ORAL_TABLET | ORAL | 0 refills | Status: AC

## 2020-09-05 MED ORDER — AZITHROMYCIN 250 MG PO TABS: 250.0000 mg | ORAL_TABLET | Freq: Every day | ORAL | 0 refills | Status: DC

## 2020-09-05 MED ORDER — FLUTICASONE FUROATE-VILANTEROL 200-25 MCG/INH IN AEPB: 1.0000 | INHALATION_SPRAY | Freq: Every day | RESPIRATORY_TRACT | 0 refills | Status: DC

## 2020-09-05 NOTE — Progress Notes (Signed)
Virtual Visit via Video Note  I connected with Erin Zavala on 09/05/20 at  3:45 PM EDT by a video enabled telemedicine application and verified that I am speaking with the correct person using two identifiers.  Location: Patient: Home Provider: Home   I discussed the limitations of evaluation and management by telemedicine and the availability of in person appointments. The patient expressed understanding and agreed to proceed.   Subjective:   PATIENT ID: Erin Zavala GENDER: female DOB: Oct 26, 1965, MRN: [id number]   HPI  Chief Complaint  Patient presents with   Wheezing    Hoarseness and not feeling well    Reason for Visit: Follow-up/Acute visit   Ms. Erin Zavala is a 55 year old female with COPD secondary to her sarcoidosis on chronic prednisone, chronic hypoxemic respiratory failure and pulmonary hypertension. She is followed by Dr. [name] for her chronic respiratory issues. She was last seen on video visit in April 2022 and advised for one month follow-up but did not return that time. She had a telephone visit on 08/25/20 reporting worsening shortness of breath and productive cough. COVID 19 was negative. Prednisone was increased to 20 mg daily x 5 days and z-pack. with return to baseline prednisone. Today she called in for concerns for flare and was scheduled for video visit.  She reports shortness of breath, productive cough, wheezing and hoarseness. She is on Symbicort, Duonebs. She is compliant with her 2L O2 at [place] day and night however has been needing 3-4L for oxygen desaturations down to 89% on exertion. COVID test was negative on 6/22.  I have personally reviewed patient's past medical/family/social history, allergies, current medications.  Past Medical History:  Diagnosis Date   Asthma    as a child   CHF (congestive heart failure) (HCC)    Cough    Dyspnea    GERD (gastroesophageal reflux disease)    Migraines    Oxygen dependent    Pulmonary hypertension  (HCC)    Sarcoidosis      Family History  Problem Relation Age of Onset   Hypertension Mother    Hypertension Father    Sarcoidosis Maternal Aunt    Rheumatologic disease Neg Hx      Social History   Occupational History   Not on file  Tobacco Use   Smoking status: Former    Packs/day: 0.25    Years: 10.00    Pack years: 2.50    Types: Cigarettes    Start date: 58    Quit date: 01/11/2011    Years since quitting: 9.6   Smokeless tobacco: Never  Substance and Sexual Activity   Alcohol use: No   Drug use: No   Sexual activity: Not on file    No Known Allergies   Outpatient Medications Prior to Visit  Medication Sig Dispense Refill   ADCIRCA 20 MG tablet TAKE 2 TABLETS BY MOUTH ONCE DAILY. CALL [phone] TO REFILL. 60 tablet 2   albuterol (VENTOLIN HFA) 108 (90 Base) MCG/ACT inhaler Inhale 2 puffs into the lungs every 4 (four) hours as needed for wheezing or shortness of breath. 18 g 2   aspirin 81 MG tablet Take 1 tablet (81 mg total) by mouth daily. 30 tablet 0   atorvastatin (LIPITOR) 80 MG tablet Take 1 tablet (80 mg total) by mouth daily. 90 tablet 3   budesonide-formoterol (SYMBICORT) 160-4.5 MCG/ACT inhaler Inhale 2 puffs into the lungs 2 (two) times daily. 2 Inhaler 0   cetirizine (ZYRTEC) 10 MG  tablet Take 1 tablet (10 mg total) by mouth daily. 90 tablet 3   esomeprazole (NEXIUM) 40 MG capsule Take 1 capsule (40 mg total) by mouth daily. 30 capsule 5   ferrous sulfate 325 (65 FE) MG tablet TAKE 1 TABLET (325 MG TOTAL) BY MOUTH 2 (TWO) TIMES DAILY WITH A MEAL. 60 tablet 5   fluticasone (FLONASE) 50 MCG/ACT nasal spray Place 2 sprays into both nostrils daily. 16 g 2   guaiFENesin (MUCINEX) 600 MG 12 hr tablet Take 600 mg by mouth 2 (two) times daily as needed.      ipratropium-albuterol (DUONEB) 0.5-2.5 (3) MG/3ML SOLN TAKE 3 MLS BY NEBULIZATION EVERY 6 (SIX) HOURS AS NEEDED. 360 mL 11   LETAIRIS 10 MG tablet TAKE 1 TABLET BY MOUTH ONCE DAILY 30 tablet 11    OXYGEN 2-3 lpm 24/7  [facility]     Treprostinil (TYVASO) 0.6 MG/ML SOLN Inhale 18 mcg into the lungs 4 (four) times daily. 10 mL 3   azithromycin (ZITHROMAX) 250 MG tablet Take two today and then one daily until finished. (Patient not taking: Reported on 09/05/2020) 6 tablet 0   predniSONE (DELTASONE) 20 MG tablet Take 1 tablet (20 mg total) by mouth daily with breakfast. (Patient not taking: Reported on 09/05/2020) 5 tablet 0   predniSONE (DELTASONE) 5 MG tablet Take 0.5 tablets (2.5 mg total) by mouth daily with breakfast. (Patient not taking: Reported on 09/05/2020) 30 tablet 11   No facility-administered medications prior to visit.    ROS   Objective:  There were no vitals filed for this visit.    Physical Exam: General: Well-appearing, no acute distress HENT: [place], AT, OP clear, MMM Eyes: EOMI, no scleral icterus Respiratory: No respiratory distress. No audible wheezing. Able to speak in full sentences Neuro: AAO x4, CNII-XII grossly intact Skin: Intact, no rashes or bruising Psych: Normal mood, normal affect    Assessment & Plan:   Discussion:  COPD Exacerbation Sarcoid flare --STOP flovent --START Breo 200-25 mcg ONE puff ONCE a day --START Azithromycin 250 mg daily x 5 days --Prednisone taper as instructed  Follow-up with Dr. [name] in 1-2 months  Health Maintenance Immunization History  Administered Date(s) Administered   Influenza,inj,Quad PF,6+ Mos 02/28/2016, 12/18/2016, 01/02/2018, 11/04/2018, 12/24/2019   PPD Test 02/28/2016   Pneumococcal Conjugate-13 11/29/2016   Pneumococcal Polysaccharide-23 11/03/2015   CT Lung Screen - not qualified due to insignificant smoking history  No orders of the defined types were placed in this encounter. Meds ordered this encounter  Medications   predniSONE (DELTASONE) 5 MG tablet    Sig: Take 8 tablets (40 mg total) by mouth daily with breakfast for 2 days, THEN 6 tablets (30 mg total) daily with breakfast for 2 days, THEN 4  tablets (20 mg total) daily with breakfast for 2 days, THEN 2 tablets (10 mg total) daily with breakfast for 2 days, THEN 1 tablet (5 mg total) daily with breakfast.    Dispense:  100 tablet    Refill:  0   fluticasone furoate-vilanterol (BREO ELLIPTA) 200-25 MCG/INH AEPB    Sig: Inhale 1 puff into the lungs daily.    Dispense:  90 each    Refill:  0   azithromycin (ZITHROMAX) 250 MG tablet    Sig: Take 1 tablet (250 mg total) by mouth daily.    Dispense:  5 tablet    Refill:  0    No follow-ups on file.  I have spent a total time of 35-minutes on  the day of the appointment reviewing prior documentation, coordinating care and discussing medical diagnosis and plan with the patient/family. Imaging, labs and tests included in this note have been reviewed and interpreted independently by me.  [place], MD [facility] 09/05/2020 3:42 PM  Office Number [phone]

## 2020-09-05 NOTE — Telephone Encounter (Signed)
Spoke with pt and scheduled for MyChart Video visit with Dr. Loanne Drilling at 3:45 today. Routing to Dr. Loanne Drilling as Juluis Rainier. Nothing further needed at this time.

## 2020-09-05 NOTE — Telephone Encounter (Signed)
Please schedule for video or telephone visit today

## 2020-09-05 NOTE — Telephone Encounter (Signed)
Primary Pulmonologist: Dr. [name]  Last office visit and with whom: 06/16/20 video visit with Dr. [name] What do we see them for (pulmonary problems): COPD, Pulm Sarcoidosis, Pulm HTN, chronic respiratory failure, GERD, Allergic Rhinitis Last OV assessment/plan: see below  Was appointment offered to patient (explain)?    Assessment and Plan: COPD (chronic obstructive pulmonary disease) ([place]) Associated with her sarcoidosis.  She tolerated the decrease in prednisone to 5 mg daily.  Were going to try to go to 2.5 mg daily.  If she has a clinical decline then we may be able to go to 2.5/5 mg every other day alternated.  Continue her Symbicort as ordered.  Minimal albuterol use.  Continue to control GERD and rhinitis which make her asthmatic COPD harder to manage   Chronic respiratory failure with hypoxia (HCC) 3-4 L/min.  I did explain to her she may have to go up on her oxygen when she participates with cardiopulmonary rehab.  They will be monitoring and will up titrate her   Pulmonary sarcoidosis [facility]) We will plan to get a high-resolution CT scan of her chest once I have her on her optimal, lowest prednisone dose   Pulmonary hypertension ([place]) Plan to continue her current regimen, Letairis, Tyvaso, Adcirca.  She is going to participate in cardiopulmonary rehab which I think will be helpful.  Following with Dr. [name] for 6-minute walks, echocardiograms   GERD (gastroesophageal reflux disease) Continue her same Nexium.  She has modified her diet which is been beneficial   Allergic rhinitis Continue Zyrtec.  She is going to increase her Flonase to twice daily through the spring allergy season.  She will also try starting nasal saline rinses       Follow Up Instructions: 1 month    I discussed the assessment and treatment plan with the patient. The patient was provided an opportunity to ask questions and all were answered. The patient agreed with the plan and demonstrated an understanding  of the instructions.   The patient was advised to call back or seek an in-person evaluation if the symptoms worsen or if the condition fails to improve as anticipated.   I provided 30 minutes of non-face-to-face time during this encounter.     Collene Gobble, MD          Reason for call: I called and spoke with patient who stated she feels like she is still having a flare. Patient is wheezing and does sound hoarse on the phone. Patient stated more SOB when having coughing fits. Does have clear mucous production but no chest tightness/pain. No fever, chills, N/V. Patient is using Symbicort, Duonebs and rescue as prescribed. Was given Zpak and increased prednisone to 17m for 5 days per RB on 08/25/2020 and is back to usual 2.524mdose. Patient is requesting a televisit.  Has not been covid tested and reports no sick contacts. Will route to DOD as Dr. BrBrock Ras in procedures today.  Dr. ElLoanne Drillingplease advise. Thanks!  (examples of things to ask: : When did symptoms start? Fever? Cough? Productive? Color to sputum? More sputum than usual? Wheezing? Have you needed increased oxygen? Are you taking your respiratory medications? What over the counter measures have you tried?)  No Known Allergies  Immunization History  Administered Date(s) Administered   Influenza,inj,Quad PF,6+ Mos 02/28/2016, 12/18/2016, 01/02/2018, 11/04/2018, 12/24/2019   PPD Test 02/28/2016   Pneumococcal Conjugate-13 11/29/2016   Pneumococcal Polysaccharide-23 11/03/2015

## 2020-09-05 NOTE — Patient Instructions (Signed)
  COPD Exacerbation Sarcoid flare --STOP flovent --START Breo 200-25 mcg ONE puff ONCE a day --START Azithromycin 250 mg daily x 5 days --Prednisone taper as instructed  Follow-up with Dr. [name] in 1-2 months

## 2020-09-07 ENCOUNTER — Encounter: Payer: Self-pay | Admitting: Pulmonary Disease

## 2020-11-09 ENCOUNTER — Encounter: Payer: Self-pay | Admitting: Emergency Medicine

## 2020-11-09 ENCOUNTER — Ambulatory Visit (INDEPENDENT_AMBULATORY_CARE_PROVIDER_SITE_OTHER): Payer: 59 | Admitting: Emergency Medicine

## 2020-11-09 ENCOUNTER — Other Ambulatory Visit: Payer: Self-pay

## 2020-11-09 DIAGNOSIS — J301 Allergic rhinitis due to pollen: Secondary | ICD-10-CM

## 2020-11-09 DIAGNOSIS — J9611 Chronic respiratory failure with hypoxia: Secondary | ICD-10-CM

## 2020-11-09 DIAGNOSIS — I272 Pulmonary hypertension, unspecified: Secondary | ICD-10-CM | POA: Diagnosis not present

## 2020-11-09 DIAGNOSIS — D860 Sarcoidosis of lung: Secondary | ICD-10-CM | POA: Diagnosis not present

## 2020-11-09 MED ORDER — FLUTICASONE FUROATE-VILANTEROL 200-25 MCG/INH IN AEPB: 1.0000 | INHALATION_SPRAY | Freq: Every day | RESPIRATORY_TRACT | 5 refills | Status: AC

## 2020-11-09 NOTE — Progress Notes (Signed)
Subjective:    Patient ID: Erin Zavala, female    DOB: [birth year], 55 y.o.   MRN: [id number]  Shortness of Breath Pertinent negatives include no abdominal pain, chest pain, leg swelling, rhinorrhea or wheezing.  ROV 12/02/2018 --Erin Zavala is 55, follows up today for her sarcoidosis with associated abnormal CT scan of the chest, secondary pulmonary hypertension.  She is currently on prednisone at 5 alternated w 2.5 daily.  She was trying to get back to work, works at the [employer]. 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 Remains on adcirca, letaris, tyvaso. Remains on symbicort. Last Ct chest March 2020. She overall feels stable, better exercise tolerance. O2 is set on 2-3L/min, titrates w activity. Flu shot up to date. She is working on losing some weight - wants to exercise. She interested in [facility].   R OV 05/11/2020 --follow-up visit for 55 year old woman with sarcoidosis associated with ocular involvement interstitial changes by CT, secondary pulmonary hypertension.  She has been on chronic prednisone, previously on 2.5/5 mg alternated daily.  Her current dose is 52m, increased about a year ago. She was experiencing fatigue, increased nasal mucous, dyspnea. She has gained about 40 lbs since increasing it. Her last formal flare was in 12/21 PAH regimen includes Adcirca, Letairis, Tyvaso. Bronchodilator regimen Symbicort. She has albuterol nebs, uses a few times a week  On oxygen at 3 to 4 L/min High-resolution CT scan of the chest 06/03/2018 shows groundglass and some moderate interstitial changes and an atypical pattern with some traction bronchiectasis, apical predominant.  ROV 11/09/20 --follow-up visit for 55year old woman with sarcoidosis and associated ILD, secondary pulmonary hypertension.  Also ocular involvement.  She is on chronic prednisone 10 mg daily as well as a PAH regimen: ARalene Cork Tyvaso.  Maintained on oxygen 1-3 L/min.   I tried decreasing her prednisone to 5 mg daily earlier this year - she tolerated but then needed a taper from 418mback down to 43m72mgain. ? Whether it was seasonal because she has flared at the same time of year before.  She was on an ICS, this was [facility] and she reports that she prefers it, is getting more benefit. Some cough with clear to white. Mostly nasal drainage. She is on flonase, zyrtec.     Review of Systems  Constitutional:  Positive for unexpected weight change (WIth increased steroid dose). Negative for fatigue.  HENT:  Negative for rhinorrhea and sinus pain.   Eyes:  Negative for discharge and redness.  Respiratory:  Positive for shortness of breath. Negative for cough, chest tightness and wheezing.   Cardiovascular:  Negative for chest pain, palpitations and leg swelling.  Gastrointestinal:  Negative for abdominal distention and abdominal pain.  Musculoskeletal:  Negative for myalgias.  Neurological:  Negative for light-headedness.    No Known Allergies   Outpatient Medications Prior to Visit  Medication Sig Dispense Refill   ADCIRCA 20 MG tablet TAKE 2 TABLETS BY MOUTH ONCE DAILY. CALL [phone] REFILL. 60 tablet 2   albuterol (VENTOLIN HFA) 108 (90 Base) MCG/ACT inhaler Inhale 2 puffs into the lungs every 4 (four) hours as needed for wheezing or shortness of breath. 18 g 2   aspirin 81 MG tablet Take 1 tablet (81 mg total) by mouth daily. 30 tablet 0   atorvastatin (LIPITOR) 80 MG tablet Take 1 tablet (80 mg total) by mouth daily. 90 tablet 3   cetirizine (ZYRTEC)  10 MG tablet Take 1 tablet (10 mg total) by mouth daily. 90 tablet 3   esomeprazole (NEXIUM) 40 MG capsule Take 1 capsule (40 mg total) by mouth daily. 30 capsule 5   ferrous sulfate 325 (65 FE) MG tablet TAKE 1 TABLET (325 MG TOTAL) BY MOUTH 2 (TWO) TIMES DAILY WITH A MEAL. 60 tablet 5   fluticasone (FLONASE) 50 MCG/ACT nasal spray Place 2 sprays into both nostrils daily. 16 g 2    fluticasone furoate-vilanterol (BREO ELLIPTA) 200-25 MCG/INH AEPB Inhale 1 puff into the lungs daily. 90 each 0   guaiFENesin (MUCINEX) 600 MG 12 hr tablet Take 600 mg by mouth 2 (two) times daily as needed.      ipratropium-albuterol (DUONEB) 0.5-2.5 (3) MG/3ML SOLN TAKE 3 MLS BY NEBULIZATION EVERY 6 (SIX) HOURS AS NEEDED. 360 mL 11   LETAIRIS 10 MG tablet TAKE 1 TABLET BY MOUTH ONCE DAILY 30 tablet 11   OXYGEN 2-3 lpm 24/7  [facility]     predniSONE (DELTASONE) 20 MG tablet Take 1 tablet (20 mg total) by mouth daily with breakfast. 5 tablet 0   predniSONE (DELTASONE) 5 MG tablet Take 0.5 tablets (2.5 mg total) by mouth daily with breakfast. 30 tablet 11   predniSONE (DELTASONE) 5 MG tablet Take 8 tablets (40 mg total) by mouth daily with breakfast for 2 days, THEN 6 tablets (30 mg total) daily with breakfast for 2 days, THEN 4 tablets (20 mg total) daily with breakfast for 2 days, THEN 2 tablets (10 mg total) daily with breakfast for 2 days, THEN 1 tablet (5 mg total) daily with breakfast. 100 tablet 0   Treprostinil (TYVASO) 0.6 MG/ML SOLN Inhale 18 mcg into the lungs 4 (four) times daily. 10 mL 3   azithromycin (ZITHROMAX) 250 MG tablet Take 1 tablet (250 mg total) by mouth daily. 5 tablet 0   budesonide-formoterol (SYMBICORT) 160-4.5 MCG/ACT inhaler Inhale 2 puffs into the lungs 2 (two) times daily. 2 Inhaler 0   No facility-administered medications prior to visit.        Objective:    Vitals:   11/09/20 1610 11/09/20 1613  BP: 132/74   Temp: 98.3 F (36.8 C)   TempSrc: Oral   SpO2:  98%  Weight: 171 lb 3.2 oz (77.7 kg)   Height: 5' (1.524 m)    Gen: Pleasant, well-nourished, in no distress,  normal affect  ENT: No lesions,  mouth clear,  oropharynx clear, no postnasal drip  Neck: No JVD, no stridor  Lungs: No use of accessory muscles, no crackles or wheezing on normal respiration, no wheeze on forced expiration  Cardiovascular: RRR, heart sounds normal, no murmur or gallops, no  peripheral edema  Musculoskeletal: No deformities, no cyanosis or clubbing  Neuro: alert, awake, non focal  Skin: Warm, no lesions or rash     Assessment & Plan:  Pulmonary sarcoidosis (HCC) Please start taking your prednisone 5 mg alternated with 2.5 mg every other day.  If you notice a worsening in your breathing then you can go back to 5 mg every day. Continue Breo once daily.  Rinse and gargle after using. Keep albuterol available use 2 puffs if needed for shortness of breath, chest tightness, wheezing. Follow with Dr [name] in 6 months or sooner if you have any problems  Chronic respiratory failure with hypoxia ([place]) Continue your oxygen 1-3 L/min as you have been using it  Pulmonary hypertension ([place]) Continue Tyvaso, Adcirca, Letaris as you have been taking them and  follow-up with cardiology clinic  Allergic rhinitis Continue your Flonase and Zyrtec    Baltazar Apo, MD, PhD 11/09/2020, 4:28 PM [facility] [phone] or if no answer before 7:00PM call [phone] For any issues after 7:00PM please call eLink [phone]

## 2020-11-09 NOTE — Assessment & Plan Note (Signed)
Please start taking your prednisone 5 mg alternated with 2.5 mg every other day.  If you notice a worsening in your breathing then you can go back to 5 mg every day. Continue Breo once daily.  Rinse and gargle after using. Keep albuterol available use 2 puffs if needed for shortness of breath, chest tightness, wheezing. Follow with Dr [name] in 6 months or sooner if you have any problems

## 2020-11-09 NOTE — Patient Instructions (Addendum)
Please start taking your prednisone 5 mg alternated with 2.5 mg every other day.  If you notice a worsening in your breathing then you can go back to 5 mg every day. Continue Breo once daily.  Rinse and gargle after using. Keep albuterol available use 2 puffs if needed for shortness of breath, chest tightness, wheezing. Continue your oxygen 1-3 L/min as you have been using it Continue Tyvaso, Adcirca, Letaris as you have been taking them and follow-up with cardiology clinic Continue your Flonase and Zyrtec Follow with Dr [name] in 6 months or sooner if you have any problems

## 2020-11-09 NOTE — Assessment & Plan Note (Signed)
Continue your oxygen 1-3 L/min as you have been using it

## 2020-11-09 NOTE — Assessment & Plan Note (Signed)
Continue your Flonase and Zyrtec

## 2020-11-09 NOTE — Assessment & Plan Note (Signed)
Continue Larena Sox as you have been taking them and follow-up with cardiology clinic

## 2020-11-15 ENCOUNTER — Other Ambulatory Visit: Payer: Self-pay | Admitting: Emergency Medicine

## 2020-11-25 ENCOUNTER — Other Ambulatory Visit: Payer: Self-pay | Admitting: Pulmonary Disease

## 2020-11-30 ENCOUNTER — Telehealth: Payer: Self-pay | Admitting: Emergency Medicine

## 2020-11-30 NOTE — Telephone Encounter (Signed)
I have called the pt and LM on VM for her to call back.

## 2020-12-02 ENCOUNTER — Other Ambulatory Visit: Payer: Self-pay | Admitting: Emergency Medicine

## 2020-12-02 MED ORDER — PREDNISONE 5 MG PO TABS: 2.5000 mg | ORAL_TABLET | Freq: Every day | ORAL | 11 refills | Status: DC

## 2020-12-02 NOTE — Telephone Encounter (Signed)
Rx for pred refilled and I spoke with the pt and notified that this was done Nothing further needed

## 2020-12-02 NOTE — Telephone Encounter (Signed)
ATC LVMTCB  x 1

## 2020-12-02 NOTE — Telephone Encounter (Signed)
[phone] calling back

## 2020-12-13 ENCOUNTER — Encounter (HOSPITAL_COMMUNITY): Payer: Self-pay | Admitting: Cardiology

## 2020-12-13 ENCOUNTER — Ambulatory Visit (HOSPITAL_COMMUNITY)
Admission: RE | Admit: 2020-12-13 | Discharge: 2020-12-13 | Disposition: A | Discharge status: Home / Self Care | Payer: 59 | Source: Ambulatory Visit | Attending: Cardiology | Admitting: Cardiology

## 2020-12-13 ENCOUNTER — Other Ambulatory Visit (HOSPITAL_COMMUNITY): Payer: Self-pay

## 2020-12-13 ENCOUNTER — Other Ambulatory Visit: Payer: Self-pay

## 2020-12-13 VITALS — BP 140/80 | HR 95 | Wt 175.0 lb

## 2020-12-13 DIAGNOSIS — Z955 Presence of coronary angioplasty implant and graft: Secondary | ICD-10-CM | POA: Insufficient documentation

## 2020-12-13 DIAGNOSIS — Z942 Lung transplant status: Secondary | ICD-10-CM | POA: Insufficient documentation

## 2020-12-13 DIAGNOSIS — Z9981 Dependence on supplemental oxygen: Secondary | ICD-10-CM | POA: Diagnosis not present

## 2020-12-13 DIAGNOSIS — Z7952 Long term (current) use of systemic steroids: Secondary | ICD-10-CM | POA: Insufficient documentation

## 2020-12-13 DIAGNOSIS — D860 Sarcoidosis of lung: Secondary | ICD-10-CM

## 2020-12-13 DIAGNOSIS — D869 Sarcoidosis, unspecified: Secondary | ICD-10-CM | POA: Diagnosis not present

## 2020-12-13 DIAGNOSIS — Z7982 Long term (current) use of aspirin: Secondary | ICD-10-CM | POA: Diagnosis not present

## 2020-12-13 DIAGNOSIS — Z87891 Personal history of nicotine dependence: Secondary | ICD-10-CM | POA: Diagnosis not present

## 2020-12-13 DIAGNOSIS — J449 Chronic obstructive pulmonary disease, unspecified: Secondary | ICD-10-CM

## 2020-12-13 DIAGNOSIS — I5022 Chronic systolic (congestive) heart failure: Secondary | ICD-10-CM

## 2020-12-13 DIAGNOSIS — Z7901 Long term (current) use of anticoagulants: Secondary | ICD-10-CM

## 2020-12-13 DIAGNOSIS — Z8249 Family history of ischemic heart disease and other diseases of the circulatory system: Secondary | ICD-10-CM | POA: Insufficient documentation

## 2020-12-13 DIAGNOSIS — I2510 Atherosclerotic heart disease of native coronary artery without angina pectoris: Secondary | ICD-10-CM | POA: Diagnosis not present

## 2020-12-13 DIAGNOSIS — I272 Pulmonary hypertension, unspecified: Secondary | ICD-10-CM

## 2020-12-13 DIAGNOSIS — I2721 Secondary pulmonary arterial hypertension: Secondary | ICD-10-CM | POA: Diagnosis present

## 2020-12-13 DIAGNOSIS — I50810 Right heart failure, unspecified: Secondary | ICD-10-CM | POA: Insufficient documentation

## 2020-12-13 LAB — BASIC METABOLIC PANEL
Anion gap: 7 (ref 5–15)
BUN: 9 mg/dL (ref 6–20)
CO2: 29 mmol/L (ref 22–32)
Calcium: 8.8 mg/dL — ABNORMAL LOW (ref 8.9–10.3)
Chloride: 103 mmol/L (ref 98–111)
Creatinine, Ser: 0.72 mg/dL (ref 0.44–1.00)
GFR, Estimated: >60 mL/min (ref >60)
Glucose, Bld: 110 mg/dL — ABNORMAL HIGH (ref 70–99)
Potassium: 3.8 mmol/L (ref 3.5–5.1)
Sodium: 139 mmol/L (ref 135–145)

## 2020-12-13 LAB — BRAIN NATRIURETIC PEPTIDE: B Natriuretic Peptide: 59.8 pg/mL (ref 0.0–100.0)

## 2020-12-13 NOTE — Patient Instructions (Signed)
EKG done today.  6 minute walk test done today.  Labs done today. We will contact you only if your labs are abnormal.  We will be in contact with you regarding changing your Tyvaso to the Dry Powder Inhaler.   No other medication changes were made. Please continue all current medications as prescribed.  You have been referred to Pulmonary Rehab. They will contact you to schedule an appointment.   Your physician recommends that you schedule a follow-up appointment in: 3  months with an echo prior to your exam. Please contact our office in December to schedule a January 2023 appointment.   Your physician has requested that you have an echocardiogram. Echocardiography is a painless test that uses sound waves to create images of your heart. It provides your doctor with information about the size and shape of your heart and how well your heart's chambers and valves are working. This procedure takes approximately one hour. There are no restrictions for this procedure.  If you have any questions or concerns before your next appointment please send [place] a message through [place] or call our office at [phone].    TO LEAVE A MESSAGE FOR THE NURSE SELECT OPTION 2, PLEASE LEAVE A MESSAGE INCLUDING: YOUR NAME DATE OF BIRTH CALL BACK NUMBER REASON FOR CALL**this is important as we prioritize the call backs  YOU WILL RECEIVE A CALL BACK THE SAME DAY AS LONG AS YOU CALL BEFORE 4:00 PM   Do the following things EVERYDAY: Weigh yourself in the morning before breakfast. Write it down and keep it in a log. Take your medicines as prescribed Eat low salt foods--Limit salt (sodium) to 2000 mg per day.  Stay as active as you can everyday Limit all fluids for the day to less than 2 liters   At the [facility], you and your health needs are our priority. As part of our continuing mission to provide you with exceptional heart care, we have created designated Provider Care Teams. These  Care Teams include your primary Cardiologist (physician) and Advanced Practice Providers (APPs- Physician Assistants and Nurse Practitioners) who all work together to provide you with the care you need, when you need it.   You may see any of the following providers on your designated Care Team at your next follow up: Dr Glori Bickers Dr Haynes Kerns, NP Lyda Jester, [place] Audry Riles, PharmD   Please be sure to bring in all your medications bottles to every appointment.

## 2020-12-13 NOTE — Progress Notes (Signed)
I performed 6 min walk test.  Patient walked approx 775 feet while wearing 3 L oxygen.  She required to increase oxygen to 4L  due to shortness of breath however Oxygen saturation only fell to 90%.  HR ranged 90 to 135 BPM.

## 2020-12-13 NOTE — Progress Notes (Signed)
Date:  12/13/2020   ID:  Erin Zavala, DOB 12-12-1965, MRN [id number]  Provider location: [facility] Type of Visit: Established patient  PCP:  Patient, No Pcp Per (Inactive)  Cardiologist: Dr. [name]   History of Present Illness: Erin Zavala is a 55 y.o. female who has history of sarcoidosis, chronic hypoxemic respiratory failure on home oxygen, and pulmonary hypertension.  She was diagnosed with ocular sarcoidosis in 1997. It seems like she did not have significant lung complications (that were recognized at least) until early 2017.  She now requires home oxygen.  CTA chest shows changes consistent with sarcoidosis, this has been confirmed by biopsy.  Echo in 8/17 had evidence for RV strain and elevated PA pressure.  [place] in 10/17 confirmed pulmonary arterial hypertension with normal right and left heart filling pressures.    She has started on macitentan followed by Adcirca and most recently Tyvaso.  She did not tolerate selexipag well. She feels like these medications have helped her breathing.     She went to [facility] for transplant evaluation in 4/18.  As part of that evaluation, she had right and left heart cath.  Surprisingly, given age and lack of family history, she had an 80% LAD stenosis.  This was treated with DES.  RHC showed mild to moderate pulmonary hypertension. Transplant evaluation is currently on hold as she has been doing better symptomatically.   Echo in 8/20 showed EF 55-60%, normal RV, PASP 40 mmHg.  Echo in 8/21 showed EF 60-65% with normal RV, PASP 31 mmHg.    Echo in 12/21 with EF 60-65%, normal RV, PASP 31 mmHg, IVC normal.    She returns for followup of pulmonary hypertension.  She is using 1 L oxygen by [place] at rest, 3 L with exertion.  She is down to prednisone 5 mg daily alternating with 2.5 mg daily.  Breathing is "pretty good." No dyspnea walking on flat ground wearing oxygen.  No chest pain.  No lightheadedness.  Weight up 3 lbs.     ECG (personally reviewed): NSR, normal  Labs (9/17): NA negative, ESR 112, anti-dsDNA negative, anti-CCP negative, RF < 14.  ACE 136.  Labs (10/17): K 4.1, creatinine 0.8 Labs (11/17): K 3.8, creatinine 0.91 Labs (3/18): BNP 16 Labs (5/18): K 4.6, creatinine 0.74 Labs (7/18): creatinine 0.79 Labs (9/18): K 4.3, creatinine 0.76 Labs (10/18): LDL 62, HDL 40 Labs (12/19): K 3.6, creatinine 0.73 Labs (1/20): BNP 17.7 Labs (3/20): LDL 67, HDL 65, TGs 185  Labs (8/20): BNP 53 Labs (1/21): K 3.9, creatinine 0.93, BNP 51, LDL 117 Labs (7/21): LDL 62, HDL 60 Labs (12/21): BNP 96, K 3.9, creatinine 0.91 Labs (3/22): BNP 42, LDL 76   6 minute walk (10/17): 305 m.  6 minute walk (1/18): 305 m 6 minute walk (3/18): 270 m 6 minute walk (6/18): 302 m 6 minute walk (9/18): 396 m 6 minute walk (2/19): 213 m (could have kept going but was stopped due to decreased oxygen saturation).  6 minute walk (4/19): 396 m 6 minute walk (7/19): 381 m 6 minute walk (8/20) 359 m 6 minute walk (1/21): 289 m 6 minute walk (5/21): 229 m 6 minute walk (12/21): 274 m 6 minute walk (10/22): 32 m   PMH: 1. GERD 2. Pulmonary hypertension: Suspect Group 5 PH related to sarcoidosis.  - CTA chest (8/17) with no PE but mediastinal/hilar LAN, cystic lung changes, subpleural consolidation, patchy ground glass.  - Echo (  8/17): EF 55-60%, D-shaped interventricular septum, PASP 60 mmHg. - ANA negative, ESR 112, anti-dsDNA negative, anti-CCP negative, RF < 14.  - RHC (10/17): mean RA 3, PA 59/22 mean 36, mean PCWP 5, CI 2.92, PVR 7.4 WU.  - PFTs (10/17): Restrictive.  - RHC (4/18): mean RA 4, PA mean 32, mean PCWP 8, CI 2.9, PVR 5.2 WU.  - Did not tolerate selexipag.  - Echo (5/19): EF 60-65%, mild LVH, mild MR, RV normal size and systolic function, PASP 37 mmHg.  - Echo (8/20): EF 55-60%, normal RV size and systolic function, PASP 40 mmHg, IVC normal - Echo (8/21): EF 60-65% with normal RV, PASP 31 mmHg.   - Echo  (12/21): EF 60-65%, normal RV, PASP 31 mmHg, IVC normal 3. Sarcoidosis: Diagnosed with ocular sarcoidosis in 1997. Significant pulmonary involvement noted in 8/17.  Lung biopsy positive.  - Cardiac MRI (5/18): EF 66%, normal RV size with low normal RV systolic function (EF 92%), no LGE (no evidence for cardiac sarcoidosis).  - CT chest 3/19 with stable pulmonary fibrotic changes related to sarcoidosis.  - PFTs (4/19): FVC 66%, FEV1 69%, ratio 103%, DLCO 37%, TLC 62% => moderate -severe mixed obstruction/restriction.  4. Chronic hypoxemic respiratory failure: Home oxygen 5. Childhood asthma. 6. CAD: LHC (4/18) with 80% LAD stenosis => DES to LAD.   Current Outpatient Medications  Medication Sig Dispense Refill   ADCIRCA 20 MG tablet TAKE 2 TABLETS BY MOUTH ONCE DAILY. CALL [phone] TO REFILL. 60 tablet 2   albuterol (VENTOLIN HFA) 108 (90 Base) MCG/ACT inhaler Inhale 2 puffs into the lungs every 4 (four) hours as needed for wheezing or shortness of breath. 18 g 2   aspirin 81 MG tablet Take 1 tablet (81 mg total) by mouth daily. 30 tablet 0   atorvastatin (LIPITOR) 80 MG tablet Take 1 tablet (80 mg total) by mouth daily. 90 tablet 3   cetirizine (ZYRTEC) 10 MG tablet Take 1 tablet (10 mg total) by mouth daily. 90 tablet 3   esomeprazole (NEXIUM) 40 MG capsule TAKE 1 CAPSULE EVERY DAY 90 capsule 3   ferrous sulfate 325 (65 FE) MG tablet TAKE 1 TABLET (325 MG TOTAL) BY MOUTH 2 (TWO) TIMES DAILY WITH A MEAL. 60 tablet 5   fluticasone (FLONASE) 50 MCG/ACT nasal spray Place 2 sprays into both nostrils daily. 16 g 2   fluticasone furoate-vilanterol (BREO ELLIPTA) 200-25 MCG/INH AEPB Inhale 1 puff into the lungs daily. 90 each 0   guaiFENesin (MUCINEX) 600 MG 12 hr tablet Take 600 mg by mouth 2 (two) times daily as needed.      ipratropium-albuterol (DUONEB) 0.5-2.5 (3) MG/3ML SOLN TAKE 3 MLS BY NEBULIZATION EVERY 6 (SIX) HOURS AS NEEDED. 360 mL 10   LETAIRIS 10 MG tablet TAKE 1 TABLET BY MOUTH  ONCE DAILY 30 tablet 11   OXYGEN 2-3 lpm 24/7  AHC     predniSONE (DELTASONE) 5 MG tablet Take 0.5-1 tablets (2.5-5 mg total) by mouth daily with breakfast. 30 tablet 11   Treprostinil (TYVASO) 0.6 MG/ML SOLN Inhale 18 mcg into the lungs 4 (four) times daily. 10 mL 3   No current facility-administered medications for this encounter.    Allergies:   Patient has no known allergies.   Social History:  The patient  reports that she quit smoking about 9 years ago. Her smoking use included cigarettes. She started smoking about 34 years ago. She has a 2.50 pack-year smoking history. She has never used smokeless tobacco.  She reports that she does not drink alcohol and does not use drugs.   Family History:  The patient's family history includes Hypertension in her father and mother; Sarcoidosis in her maternal aunt.   ROS:  Please see the history of present illness.   All other systems are personally reviewed and negative.   Exam:   BP 140/80   Pulse 95   Wt 79.4 kg (175 lb)   SpO2 100% Comment: 3Liters N/C  BMI 34.18 kg/m  General: NAD Neck: No JVD, no thyromegaly or thyroid nodule.  Lungs: Decreased breath sounds at bases.  CV: Nondisplaced PMI.  Heart regular S1/S2, no S3/S4, no murmur.  No peripheral edema.  No carotid bruit.  Normal pedal pulses.  Abdomen: Soft, nontender, no hepatosplenomegaly, no distention.  Skin: Intact without lesions or rashes.  Neurologic: Alert and oriented x 3.  Psych: Normal affect. Extremities: No clubbing or cyanosis.  HEENT: Normal.   Recent Labs: 12/13/2020: B Natriuretic Peptide 59.8; BUN 9; Creatinine, Ser 0.72; Potassium 3.8; Sodium 139  Personally reviewed   Wt Readings from Last 3 Encounters:  12/13/20 79.4 kg (175 lb)  11/09/20 77.7 kg (171 lb 3.2 oz)  06/07/20 78.4 kg (172 lb 12.8 oz)      ASSESSMENT AND PLAN:  1. Sarcoidosis: Ocular and lung involvement, biopsy-proven.  Cardiac MRI in 5/18 did not show evidence for cardiac sarcoidosis.   She is on prednisone and home oxygen. She has been evaluated at [facility] for lung transplant, this is currently on hold as she had clinically improved on PH meds.  Ups and downs with breathing recently, has not been able to wean off prednisone though down to 5 mg daily.  - Follows with Dr. [name].  Continue prednisone per pulmonary.  2. Pulmonary hypertension: Pulmonary arterial hypertension. Suspect mixed group 1 and group 5 PH. CTA chest showed no PE but did show changes of sarcoidosis.  PFTs were restrictive.  Echo showed RV strain and elevated PA pressure.  PAH was confirmed by [place] in 10/17 with PVR 7.4 WU.  Serologic workup was negative.  [place] in 4/18 showed mild to moderate residual pulmonary hypertension. She has felt like generic tadalafil has not been as effective as brand name Adcirca.  She is tolerating Tyvaso (unable to tolerate Malvin Johns).  Echo 12/21 showed normal-appearing RV and PASP 31 mmHg. She is not volume overloaded on exam. 6 minute walk today was mildly less than prior.  - Continue ambrisentan.   - Continue brand-name Adcirca 40 mg daily.  - Continue Tyvaso but will try to switch her to the DPI form.   - Check BNP today.  - I will refer for pulmonary rehab.   - Echo with followup in 3 months.  3. CAD: Found incidentally on cath 4/18 done as part of workup for lung transplantation.  80% LAD stenosis, treated with DES to LAD.   No chest pain.  - Continue ASA 81 mg daily.   - Continue atorvastatin 80 mg daily, good LDL in 3/22.    Recommended follow-up:  3 months with echo.   Signed, Loralie Champagne, MD  12/13/2020  [place] [address] [facility] [place] [address] [phone] (office) [phone] (fax)

## 2020-12-14 ENCOUNTER — Encounter (HOSPITAL_COMMUNITY): Payer: Self-pay | Admitting: *Deleted

## 2020-12-14 ENCOUNTER — Telehealth (HOSPITAL_COMMUNITY): Payer: Self-pay | Admitting: Pharmacist

## 2020-12-14 ENCOUNTER — Other Ambulatory Visit (HOSPITAL_COMMUNITY): Payer: Self-pay

## 2020-12-14 NOTE — Telephone Encounter (Signed)
Patient Advocate Encounter   Received notification from [facility] that prior authorization for Tyvaso DPI is required.   PA submitted on CoverMyMeds Key [id number] Status is pending   Will continue to follow.   Audry Riles, PharmD, BCPS, BCCP, CPP Heart Failure Clinic Pharmacist [phone]

## 2020-12-14 NOTE — Progress Notes (Signed)
Received second referral from Dr. [name] for this pt to participate in pulmonary rehab with the diagnosis of Pulmonary Hypertension.  Pt was referred in May did not respond to message left nor letter mailed.  Therefore the referral was closed on June 16th.  Clinical review of pt follow up appt on 12/13/20 Pulmonary office note.  Pt with Covid Risk Score - 4. Pt appropriate for scheduling for Pulmonary rehab.  Will forward to support staff for scheduling and verification of insurance eligibility/benefits with pt consent. Cherre Huger, BSN Cardiac and Training and development officer

## 2020-12-15 NOTE — Telephone Encounter (Signed)
Advanced Heart Failure Patient Advocate Encounter  Prior Authorization for Tyvaso DPI has been approved.    Effective dates: 12/15/20 through 03/11/22   Tyvaso enrollment form faxed to [place].   Audry Riles, PharmD, BCPS, BCCP, CPP Heart Failure Clinic Pharmacist [phone]

## 2020-12-16 ENCOUNTER — Other Ambulatory Visit: Payer: Self-pay | Admitting: *Deleted

## 2020-12-16 DIAGNOSIS — D860 Sarcoidosis of lung: Secondary | ICD-10-CM

## 2020-12-16 DIAGNOSIS — J441 Chronic obstructive pulmonary disease with (acute) exacerbation: Secondary | ICD-10-CM

## 2020-12-16 MED ORDER — FLUTICASONE FUROATE-VILANTEROL 200-25 MCG/INH IN AEPB
1.0000 | INHALATION_SPRAY | Freq: Every day | RESPIRATORY_TRACT | 1 refills | Status: DC
Start: 2020-12-16 — End: 2021-11-02

## 2021-01-03 ENCOUNTER — Other Ambulatory Visit (HOSPITAL_COMMUNITY): Payer: Self-pay | Admitting: Cardiology

## 2021-01-04 ENCOUNTER — Telehealth (HOSPITAL_COMMUNITY): Payer: Self-pay | Admitting: Pharmacy Technician

## 2021-01-04 NOTE — Telephone Encounter (Signed)
Advanced Heart Failure Patient Advocate Encounter   Patient was approved to receive Letairis from [place].  Patient ID: [id number] Effective dates: 05/30/20 through 05/30/21  Charlann Boxer, CPhT

## 2021-01-12 ENCOUNTER — Telehealth (HOSPITAL_COMMUNITY): Payer: Self-pay | Admitting: Pharmacist

## 2021-01-12 NOTE — Telephone Encounter (Signed)
Faxed prescriber portion of patient assistance application for Adcirca to [place], PharmD, BCPS, BCCP, CPP Heart Failure Clinic Pharmacist [phone]

## 2021-01-13 ENCOUNTER — Telehealth: Payer: Self-pay | Admitting: Emergency Medicine

## 2021-01-13 MED ORDER — PREDNISONE 10 MG PO TABS
ORAL_TABLET | ORAL | 0 refills | Status: DC
Start: 2021-01-13 — End: 2021-01-27

## 2021-01-13 NOTE — Telephone Encounter (Signed)
Called and spoke with patient. She verbalized understanding and has been scheduled to see Beth on Monday at 12pm. Prednisone has been sent in.   Nothing further needed at time of call.

## 2021-01-13 NOTE — Telephone Encounter (Signed)
Lm x1 for patient.  

## 2021-01-13 NOTE — Telephone Encounter (Signed)
She has a complex history with sarcoid and PAH, will need to be seen her as an acute OV next week.  In meantime please have her start a prednisone taper >> Take 61m daily for 3 days, then 353mdaily for 3 days, then 2036maily for 3 days, then 70m79mily for 3 days, then she will go back to her usual maintenance regimen.

## 2021-01-13 NOTE — Telephone Encounter (Signed)
Spoke to patient.  Patient reports of nasal congestion, increased sob with exertion, wheezing, prod cough with white sputum and nasal drainage yellow in color x2d Denied f/c/s or additional sx.  Spo2 is maintaining around 95% on 2L. She is using albuterol HFA QID, duoneb once daily, Breo once daily and prednisone  2.12m without relief in sx.  Not vaccinated against covid or flu.  Negative home covid test yesterday.  Dr. [name] please advise. Thanks    Current Outpatient Medications on File Prior to Visit  Medication Sig Dispense Refill   ADCIRCA 20 MG tablet Take 2 tablets (40 mg total) by mouth daily. TAKE 2 TABLETS BY MOUTH 1 TIME A DAY. 180 tablet 3   albuterol (VENTOLIN HFA) 108 (90 Base) MCG/ACT inhaler Inhale 2 puffs into the lungs every 4 (four) hours as needed for wheezing or shortness of breath. 18 g 2   aspirin 81 MG tablet Take 1 tablet (81 mg total) by mouth daily. 30 tablet 0   atorvastatin (LIPITOR) 80 MG tablet Take 1 tablet (80 mg total) by mouth daily. 90 tablet 3   cetirizine (ZYRTEC) 10 MG tablet Take 1 tablet (10 mg total) by mouth daily. 90 tablet 3   esomeprazole (NEXIUM) 40 MG capsule TAKE 1 CAPSULE EVERY DAY 90 capsule 3   ferrous sulfate 325 (65 FE) MG tablet TAKE 1 TABLET (325 MG TOTAL) BY MOUTH 2 (TWO) TIMES DAILY WITH A MEAL. 60 tablet 5   fluticasone (FLONASE) 50 MCG/ACT nasal spray Place 2 sprays into both nostrils daily. 16 g 2   fluticasone furoate-vilanterol (BREO ELLIPTA) 200-25 MCG/INH AEPB Inhale 1 puff into the lungs daily. 90 each 1   guaiFENesin (MUCINEX) 600 MG 12 hr tablet Take 600 mg by mouth 2 (two) times daily as needed.      ipratropium-albuterol (DUONEB) 0.5-2.5 (3) MG/3ML SOLN TAKE 3 MLS BY NEBULIZATION EVERY 6 (SIX) HOURS AS NEEDED. 360 mL 10   LETAIRIS 10 MG tablet TAKE 1 TABLET BY MOUTH ONCE DAILY 30 tablet 11   OXYGEN 2-3 lpm 24/7  AHC     predniSONE (DELTASONE) 5 MG tablet Take 0.5-1 tablets (2.5-5 mg total) by mouth daily with breakfast.  30 tablet 11   Treprostinil (TYVASO) 0.6 MG/ML SOLN Inhale 18 mcg into the lungs 4 (four) times daily. 10 mL 3   No current facility-administered medications on file prior to visit.   No Known Allergies

## 2021-01-16 ENCOUNTER — Ambulatory Visit: Payer: 59 | Admitting: Primary Care

## 2021-01-16 MED ORDER — TYVASO DPI TITRATION KIT 16 & 32 & 48 MCG IN POWD: 16.0000 ug | Freq: Four times a day (QID) | RESPIRATORY_TRACT | 11 refills | Status: AC

## 2021-01-16 NOTE — Progress Notes (Deleted)
_0  ID: Erin Zavala, female    DOB: Jun 16, 1965, 55 y.o.   MRN: [id number]  No chief complaint on file.   Referring provider: No ref. provider found  HPI: 55 year old female, former smoker. PMH significant for COPD, pulmonary sarcoidosis, pulmonary HTN, CAD, GERD. Patient of Dr. Valeta Zavala.   Previous [facility] pulmonary encounter:  ROV 12/02/2018 --Erin Zavala is 53, follows up today for her sarcoidosis with associated abnormal CT scan of the chest, secondary pulmonary hypertension.  She is currently on prednisone at 5 alternated w 2.5 daily.  She was trying to get back to work, works at the [employer]. 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 Remains on adcirca, letaris, tyvaso. Remains on symbicort. Last Ct chest March 2020. She overall feels stable, better exercise tolerance. O2 is set on 2-3L/min, titrates w activity. Flu shot up to date. She is working on losing some weight - wants to exercise. She interested in [facility].   R OV 05/11/2020 --follow-up visit for 55 year old woman with sarcoidosis associated with ocular involvement interstitial changes by CT, secondary pulmonary hypertension.  She has been on chronic prednisone, previously on 2.5/5 mg alternated daily.  Her current dose is 54m, increased about a year ago. She was experiencing fatigue, increased nasal mucous, dyspnea. She has gained about 40 lbs since increasing it. Her last formal flare was in 12/21 PAH regimen includes Adcirca, Letairis, Tyvaso. Bronchodilator regimen Symbicort. She has albuterol nebs, uses a few times a week  On oxygen at 3 to 4 L/min High-resolution CT scan of the chest 06/03/2018 shows groundglass and some moderate interstitial changes and an atypical pattern with some traction bronchiectasis, apical predominant.  ROV 11/09/20 --follow-up visit for 55year old woman with sarcoidosis and associated ILD, secondary pulmonary hypertension.  Also ocular involvement.  She  is on chronic prednisone 10 mg daily as well as a PAH regimen: ARalene Cork Tyvaso.  Maintained on oxygen 1-3 L/min.  I tried decreasing her prednisone to 5 mg daily earlier this year - she tolerated but then needed a taper from 476mback down to 60m160mgain. ? Whether it was seasonal because she has flared at the same time of year before.  She was on an ICS, [facility] and she reports that she prefers it, is getting more benefit. Some cough with clear to white. Mostly nasal drainage. She is on flonase, zyrtec.        No Known Allergies  Immunization History  Administered Date(s) Administered   Influenza,inj,Quad PF,6+ Mos 02/28/2016, 12/18/2016, 01/02/2018, 11/04/2018, 12/24/2019   PPD Test 02/28/2016   Pneumococcal Conjugate-13 11/29/2016   Pneumococcal Polysaccharide-23 11/03/2015    Past Medical History:  Diagnosis Date   Asthma    as a child   CHF (congestive heart failure) (HCC)    Cough    Dyspnea    GERD (gastroesophageal reflux disease)    Migraines    Oxygen dependent    Pulmonary hypertension ([facility]CC[facility]  Sarcoidosis     Tobacco History: Social History   Tobacco Use  Smoking Status Former   Packs/day: 0.25   Years: 10.00   Pack years: 2.50   Types: Cigarettes   Start date: 19843Quit date: 01/11/2011   Years since quitting: 10.0  Smokeless Tobacco Never   Counseling given: Not Answered   Outpatient Medications Prior to Visit  Medication Sig Dispense Refill   ADCIRCA 20 MG tablet  Take 2 tablets (40 mg total) by mouth daily. TAKE 2 TABLETS BY MOUTH 1 TIME A DAY. 180 tablet 3   albuterol (VENTOLIN HFA) 108 (90 Base) MCG/ACT inhaler Inhale 2 puffs into the lungs every 4 (four) hours as needed for wheezing or shortness of breath. 18 g 2   aspirin 81 MG tablet Take 1 tablet (81 mg total) by mouth daily. 30 tablet 0   atorvastatin (LIPITOR) 80 MG tablet Take 1 tablet (80 mg total) by mouth daily. 90 tablet 3   cetirizine (ZYRTEC) 10 MG  tablet Take 1 tablet (10 mg total) by mouth daily. 90 tablet 3   esomeprazole (NEXIUM) 40 MG capsule TAKE 1 CAPSULE EVERY DAY 90 capsule 3   ferrous sulfate 325 (65 FE) MG tablet TAKE 1 TABLET (325 MG TOTAL) BY MOUTH 2 (TWO) TIMES DAILY WITH A MEAL. 60 tablet 5   fluticasone (FLONASE) 50 MCG/ACT nasal spray Place 2 sprays into both nostrils daily. 16 g 2   fluticasone furoate-vilanterol (BREO ELLIPTA) 200-25 MCG/INH AEPB Inhale 1 puff into the lungs daily. 90 each 1   guaiFENesin (MUCINEX) 600 MG 12 hr tablet Take 600 mg by mouth 2 (two) times daily as needed.      ipratropium-albuterol (DUONEB) 0.5-2.5 (3) MG/3ML SOLN TAKE 3 MLS BY NEBULIZATION EVERY 6 (SIX) HOURS AS NEEDED. 360 mL 10   LETAIRIS 10 MG tablet TAKE 1 TABLET BY MOUTH ONCE DAILY 30 tablet 11   OXYGEN 2-3 lpm 24/7  AHC     predniSONE (DELTASONE) 10 MG tablet Take 4 tabs by mouth for 3 days, then 3 for 3 days, 2 for 3 days, 1 for 3 days and stop 30 tablet 0   predniSONE (DELTASONE) 5 MG tablet Take 0.5-1 tablets (2.5-5 mg total) by mouth daily with breakfast. 30 tablet 11   Treprostinil (TYVASO DPI TITRATION KIT) 16 & 32 & 48 MCG POWD Inhale 16 mcg into the lungs in the morning, at noon, in the evening, and at bedtime. 112 each 11   No facility-administered medications prior to visit.      Review of Systems  Review of Systems   Physical Exam  There were no vitals taken for this visit. Physical Exam   Lab Results:  CBC    Component Value Date/Time   WBC 8.5 02/17/2018 0950   RBC 4.50 02/17/2018 0950   HGB 13.0 02/17/2018 0950   HCT 40.7 02/17/2018 0950   PLT 298.0 02/17/2018 0950   MCV 90.5 02/17/2018 0950   MCH 29.0 01/13/2016 1245   MCHC 31.8 02/17/2018 0950   RDW 15.0 02/17/2018 0950   LYMPHSABS 1.1 09/26/2016 1707   MONOABS 0.7 09/26/2016 1707   EOSABS 0.3 09/26/2016 1707   BASOSABS 0.1 09/26/2016 1707    BMET    Component Value Date/Time   NA 139 12/13/2020 1050   K 3.8 12/13/2020 1050   CL 103  12/13/2020 1050   CO2 29 12/13/2020 1050   GLUCOSE 110 (H) 12/13/2020 1050   BUN 9 12/13/2020 1050   CREATININE 0.72 12/13/2020 1050   CALCIUM 8.8 (L) 12/13/2020 1050   GFRNONAA >60 12/13/2020 1050   GFRAA >60 03/16/2019 1505    BNP    Component Value Date/Time   BNP 59.8 12/13/2020 1050    ProBNP No results found for: PROBNP  Imaging: No results found.   Assessment & Plan:   No problem-specific Assessment & Plan notes found for this encounter.     Martyn Ehrich, NP  01/16/2021

## 2021-01-16 NOTE — Telephone Encounter (Signed)
Received message from [place]. Patient has been initiated on Tyvaso DPI 16 mcg 4x/day. First dose 01/11/21. Tolerated therapy well. Next visit 01/17/21.   Audry Riles, PharmD, BCPS, BCCP, CPP Heart Failure Clinic Pharmacist [phone]

## 2021-01-18 NOTE — Progress Notes (Deleted)
_0  ID: Erin Zavala, female    DOB: [birth year], 55 y.o.   MRN: [id number]  No chief complaint on file.   Referring provider: No ref. provider found  HPI: 55 year old female, former smoker. PMH significant for COPD, pulmonary sarcoidosis, pulmonary HTN, CAD, GERD. Patient of Dr. Valeta Harms.   Previous [facility] pulmonary encounter:  ROV 12/02/2018 --Erin Zavala is 53, follows up today for her sarcoidosis with associated abnormal CT scan of the chest, secondary pulmonary hypertension.  She is currently on prednisone at 5 alternated w 2.5 daily.  She was trying to get back to work, works at the [employer]. 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 Remains on adcirca, letaris, tyvaso. Remains on symbicort. Last Ct chest March 2020. She overall feels stable, better exercise tolerance. O2 is set on 2-3L/min, titrates w activity. Flu shot up to date. She is working on losing some weight - wants to exercise. She interested in [facility].   R OV 05/11/2020 --follow-up visit for 55 year old woman with sarcoidosis associated with ocular involvement interstitial changes by CT, secondary pulmonary hypertension.  She has been on chronic prednisone, previously on 2.5/5 mg alternated daily.  Her current dose is 434m, increased about a year ago. She was experiencing fatigue, increased nasal mucous, dyspnea. She has gained about 40 lbs since increasing it. Her last formal flare was in 12/21 PAH regimen includes Adcirca, Letairis, Tyvaso. Bronchodilator regimen Symbicort. She has albuterol nebs, uses a few times a week  On oxygen at 3 to 4 L/min High-resolution CT scan of the chest 06/03/2018 shows groundglass and some moderate interstitial changes and an atypical pattern with some traction bronchiectasis, apical predominant.  ROV 11/09/20 --follow-up visit for 55year old woman with sarcoidosis and associated ILD, secondary pulmonary hypertension.  Also ocular involvement.  She  is on chronic prednisone 10 mg daily as well as a PAH regimen: ARalene Cork Tyvaso.  Maintained on oxygen 1-3 L/min.  I tried decreasing her prednisone to 5 mg daily earlier this year - she tolerated but then needed a taper from 450mback down to 34m34mgain. ? Whether it was seasonal because she has flared at the same time of year before.  She was [facility] an [facility], [facility] and she reports that she prefers it, is getting more benefit. Some cough with clear to white. Mostly nasal drainage. She is on flonase, zyrtec.    01/19/2021- interim hx  Patient presents today for acute visit. Hx sarcoid and PAH. She was given prednisone taper by Dr. ByrLamonte Sakai 01/13/21 for reports of nasal congestin, increased sob with exertion, wheezing, prod cough and nasal drainage x 2 days. Negative home covid test on 11/3.   Maintained on Breo, albuterol QID and pred 2.34mc7md.  Not vaccinated for influenza or covid         No Known Allergies  Immunization History  Administered Date(s) Administered   Influenza,inj,Quad PF,6+ Mos 02/28/2016, 12/18/2016, 01/02/2018, 11/04/2018, 12/24/2019   PPD Test 02/28/2016   Pneumococcal Conjugate-13 11/29/2016   Pneumococcal Polysaccharide-23 11/03/2015    Past Medical History:  Diagnosis Date   Asthma    as a child   CHF (congestive heart failure) (HCC)    Cough    Dyspnea    GERD (gastroesophageal reflux disease)    Migraines    Oxygen dependent    Pulmonary hypertension (HCC)Erin Zavala Sarcoidosis     Tobacco History: Social  History   Tobacco Use  Smoking Status Former   Packs/day: 0.25   Years: 10.00   Pack years: 2.50   Types: Cigarettes   Start date: 60   Quit date: 01/11/2011   Years since quitting: 10.0  Smokeless Tobacco Never   Counseling given: Not Answered   Outpatient Medications Prior to Visit  Medication Sig Dispense Refill   ADCIRCA 20 MG tablet Take 2 tablets (40 mg total) by mouth daily. TAKE 2 TABLETS BY MOUTH 1 TIME  A DAY. 180 tablet 3   albuterol (VENTOLIN HFA) 108 (90 Base) MCG/ACT inhaler Inhale 2 puffs into the lungs every 4 (four) hours as needed for wheezing or shortness of breath. 18 g 2   aspirin 81 MG tablet Take 1 tablet (81 mg total) by mouth daily. 30 tablet 0   atorvastatin (LIPITOR) 80 MG tablet Take 1 tablet (80 mg total) by mouth daily. 90 tablet 3   cetirizine (ZYRTEC) 10 MG tablet Take 1 tablet (10 mg total) by mouth daily. 90 tablet 3   esomeprazole (NEXIUM) 40 MG capsule TAKE 1 CAPSULE EVERY DAY 90 capsule 3   ferrous sulfate 325 (65 FE) MG tablet TAKE 1 TABLET (325 MG TOTAL) BY MOUTH 2 (TWO) TIMES DAILY WITH A MEAL. 60 tablet 5   fluticasone (FLONASE) 50 MCG/ACT nasal spray Place 2 sprays into both nostrils daily. 16 g 2   fluticasone furoate-vilanterol (BREO ELLIPTA) 200-25 MCG/INH AEPB Inhale 1 puff into the lungs daily. 90 each 1   guaiFENesin (MUCINEX) 600 MG 12 hr tablet Take 600 mg by mouth 2 (two) times daily as needed.      ipratropium-albuterol (DUONEB) 0.5-2.5 (3) MG/3ML SOLN TAKE 3 MLS BY NEBULIZATION EVERY 6 (SIX) HOURS AS NEEDED. 360 mL 10   LETAIRIS 10 MG tablet TAKE 1 TABLET BY MOUTH ONCE DAILY 30 tablet 11   OXYGEN 2-3 lpm 24/7  AHC     predniSONE (DELTASONE) 10 MG tablet Take 4 tabs by mouth for 3 days, then 3 for 3 days, 2 for 3 days, 1 for 3 days and stop 30 tablet 0   predniSONE (DELTASONE) 5 MG tablet Take 0.5-1 tablets (2.5-5 mg total) by mouth daily with breakfast. 30 tablet 11   Treprostinil (TYVASO DPI TITRATION KIT) 16 & 32 & 48 MCG POWD Inhale 16 mcg into the lungs in the morning, at noon, in the evening, and at bedtime. 112 each 11   No facility-administered medications prior to visit.      Review of Systems  Review of Systems   Physical Exam  There were no vitals taken for this visit. Physical Exam   Lab Results:  CBC    Component Value Date/Time   WBC 8.5 02/17/2018 0950   RBC 4.50 02/17/2018 0950   HGB 13.0 02/17/2018 0950   HCT 40.7  02/17/2018 0950   PLT 298.0 02/17/2018 0950   MCV 90.5 02/17/2018 0950   MCH 29.0 01/13/2016 1245   MCHC 31.8 02/17/2018 0950   RDW 15.0 02/17/2018 0950   LYMPHSABS 1.1 09/26/2016 1707   MONOABS 0.7 09/26/2016 1707   EOSABS 0.3 09/26/2016 1707   BASOSABS 0.1 09/26/2016 1707    BMET    Component Value Date/Time   NA 139 12/13/2020 1050   K 3.8 12/13/2020 1050   CL 103 12/13/2020 1050   CO2 29 12/13/2020 1050   GLUCOSE 110 (H) 12/13/2020 1050   BUN 9 12/13/2020 1050   CREATININE 0.72 12/13/2020 1050   CALCIUM 8.8 (  L) 12/13/2020 1050   GFRNONAA >60 12/13/2020 1050   GFRAA >60 03/16/2019 1505    BNP    Component Value Date/Time   BNP 59.8 12/13/2020 1050    ProBNP No results found for: PROBNP  Imaging: No results found.   Assessment & Plan:   No problem-specific Assessment & Plan notes found for this encounter.     Martyn Ehrich, NP 01/18/2021

## 2021-01-19 ENCOUNTER — Telehealth: Payer: Self-pay | Admitting: Primary Care

## 2021-01-19 ENCOUNTER — Ambulatory Visit: Payer: 59 | Admitting: Primary Care

## 2021-01-19 NOTE — Telephone Encounter (Addendum)
Per Beth via epic secure chat--okay to do a video visit, but would prefer in office.   Lm for patient.

## 2021-01-19 NOTE — Telephone Encounter (Signed)
I want her to be seen in person

## 2021-01-23 ENCOUNTER — Other Ambulatory Visit: Payer: Self-pay | Admitting: Pulmonary Disease

## 2021-01-23 DIAGNOSIS — D860 Sarcoidosis of lung: Secondary | ICD-10-CM

## 2021-01-23 DIAGNOSIS — J441 Chronic obstructive pulmonary disease with (acute) exacerbation: Secondary | ICD-10-CM

## 2021-01-27 ENCOUNTER — Telehealth: Payer: 59 | Admitting: Physician Assistant

## 2021-01-27 DIAGNOSIS — B9689 Other specified bacterial agents as the cause of diseases classified elsewhere: Secondary | ICD-10-CM

## 2021-01-27 DIAGNOSIS — J069 Acute upper respiratory infection, unspecified: Secondary | ICD-10-CM

## 2021-01-27 MED ORDER — BENZONATATE 100 MG PO CAPS
100.0000 mg | ORAL_CAPSULE | Freq: Three times a day (TID) | ORAL | 0 refills | Status: DC | PRN
Start: 2021-01-27 — End: 2021-05-08

## 2021-01-27 MED ORDER — AZITHROMYCIN 250 MG PO TABS: ORAL_TABLET | ORAL | 0 refills | Status: DC

## 2021-01-27 MED ORDER — PREDNISONE 10 MG (21) PO TBPK: ORAL_TABLET | ORAL | 0 refills | Status: DC

## 2021-01-27 MED ORDER — AZITHROMYCIN 250 MG PO TABS: ORAL_TABLET | ORAL | 0 refills | Status: AC

## 2021-01-27 NOTE — Addendum Note (Signed)
Addended by: Mar Daring on: 01/27/2021 11:53 AM   Modules accepted: Orders

## 2021-01-27 NOTE — Progress Notes (Signed)
We are sorry that you are not feeling well.  Here is how we plan to help!  Based on your presentation I believe you most likely have A cough due to bacteria.  When patients have a fever and a productive cough with a change in color or increased sputum production, we are concerned about bacterial bronchitis.  If left untreated it can progress to pneumonia.  If your symptoms do not improve with your treatment plan it is important that you contact your provider.   I have prescribed Azithromyin 250 mg: two tablets now and then one tablet daily for 4 additonal days    In addition you may use A prescription cough medication called Tessalon Perles 125m. You may take 1-2 capsules every 8 hours as needed for your cough.  Prednisone 10 mg daily for 6 days (see taper instructions below)  Directions for 6 day taper: Day 1: 2 tablets before breakfast, 1 after both lunch & dinner and 2 at bedtime Day 2: 1 tab before breakfast, 1 after both lunch & dinner and 2 at bedtime Day 3: 1 tab at each meal & 1 at bedtime Day 4: 1 tab at breakfast, 1 at lunch, 1 at bedtime Day 5: 1 tab at breakfast & 1 tab at bedtime Day 6: 1 tab at breakfast  From your responses in the eVisit questionnaire you describe inflammation in the upper respiratory tract which is causing a significant cough.  This is commonly called Bronchitis and has four common causes:   Allergies Viral Infections Acid Reflux Bacterial Infection Allergies, viruses and acid reflux are treated by controlling symptoms or eliminating the cause. An example might be a cough caused by taking certain blood pressure medications. You stop the cough by changing the medication. Another example might be a cough caused by acid reflux. Controlling the reflux helps control the cough.  USE OF BRONCHODILATOR ("RESCUE") INHALERS: There is a risk from using your bronchodilator too frequently.  The risk is that over-reliance on a medication which only relaxes the muscles  surrounding the breathing tubes can reduce the effectiveness of medications prescribed to reduce swelling and congestion of the tubes themselves.  Although you feel brief relief from the bronchodilator inhaler, your asthma may actually be worsening with the tubes becoming more swollen and filled with mucus.  This can delay other crucial treatments, such as oral steroid medications. If you need to use a bronchodilator inhaler daily, several times per day, you should discuss this with your provider.  There are probably better treatments that could be used to keep your asthma under control.     HOME CARE Only take medications as instructed by your medical team. Complete the entire course of an antibiotic. Drink plenty of fluids and get plenty of rest. Avoid close contacts especially the very young and the elderly Cover your mouth if you cough or cough into your sleeve. Always remember to wash your hands A steam or ultrasonic humidifier can help congestion.   GET HELP RIGHT AWAY IF: You develop worsening fever. You become short of breath You cough up blood. Your symptoms persist after you have completed your treatment plan MAKE SURE YOU  Understand these instructions. Will watch your condition. Will get help right away if you are not doing well or get worse.    Thank you for choosing an e-visit.  Your e-visit answers were reviewed by a board certified advanced clinical practitioner to complete your personal care plan. Depending upon the condition, your plan could  have included both over the counter or prescription medications.  Please review your pharmacy choice. Make sure the pharmacy is open so you can pick up prescription now. If there is a problem, you may contact your provider through [employer] and have the prescription routed to another pharmacy.  Your safety is important to [place]. If you have drug allergies check your prescription carefully.   For the next 24 hours you can use  MyChart to ask questions about today's visit, request a non-urgent call back, or ask for a work or school excuse. You will get an email in the next two days asking about your experience. I hope that your e-visit has been valuable and will speed your recovery.  I provided 5 minutes of non face-to-face time during this encounter for chart review and documentation.

## 2021-01-28 ENCOUNTER — Other Ambulatory Visit: Payer: Self-pay | Admitting: Emergency Medicine

## 2021-02-13 ENCOUNTER — Other Ambulatory Visit: Payer: Self-pay | Admitting: Physician Assistant

## 2021-02-13 DIAGNOSIS — J069 Acute upper respiratory infection, unspecified: Secondary | ICD-10-CM

## 2021-02-13 DIAGNOSIS — B9689 Other specified bacterial agents as the cause of diseases classified elsewhere: Secondary | ICD-10-CM

## 2021-02-14 ENCOUNTER — Other Ambulatory Visit (HOSPITAL_COMMUNITY): Payer: Self-pay | Admitting: Cardiology

## 2021-04-01 ENCOUNTER — Other Ambulatory Visit: Payer: Self-pay | Admitting: Pulmonary Disease

## 2021-04-01 DIAGNOSIS — D860 Sarcoidosis of lung: Secondary | ICD-10-CM

## 2021-04-01 DIAGNOSIS — J441 Chronic obstructive pulmonary disease with (acute) exacerbation: Secondary | ICD-10-CM

## 2021-04-13 NOTE — Telephone Encounter (Signed)
Received fax from [employer] that patient was denied PAP because patient has not completed or signed the Adcirca patient assistance application.  Phone# [phone]  Document scanned into chart.

## 2021-04-24 ENCOUNTER — Encounter (HOSPITAL_COMMUNITY): Payer: Self-pay

## 2021-04-24 ENCOUNTER — Telehealth (HOSPITAL_COMMUNITY): Payer: Self-pay

## 2021-04-24 NOTE — Telephone Encounter (Signed)
Attempted to call patient in regards to Pulmonary Rehab - LM on VM Mailed letter 

## 2021-05-04 ENCOUNTER — Telehealth (HOSPITAL_COMMUNITY): Payer: Self-pay | Admitting: Pharmacist

## 2021-05-04 NOTE — Telephone Encounter (Signed)
Advanced Heart Failure Patient Advocate Encounter  Prior Authorization for Erin Zavala has been approved.    PA# [id number] Effective dates: 05/04/21 through 05/04/23  Erin Zavala, PharmD, BCPS, BCCP, CPP Heart Failure Clinic Pharmacist [phone]

## 2021-05-08 ENCOUNTER — Telehealth: Payer: 59 | Admitting: Physician Assistant

## 2021-05-08 DIAGNOSIS — J069 Acute upper respiratory infection, unspecified: Secondary | ICD-10-CM | POA: Diagnosis not present

## 2021-05-08 DIAGNOSIS — B9689 Other specified bacterial agents as the cause of diseases classified elsewhere: Secondary | ICD-10-CM | POA: Diagnosis not present

## 2021-05-08 MED ORDER — AMOXICILLIN-POT CLAVULANATE 875-125 MG PO TABS: 1.0000 | ORAL_TABLET | Freq: Two times a day (BID) | ORAL | 0 refills | Status: DC

## 2021-05-08 MED ORDER — BENZONATATE 100 MG PO CAPS: 100.0000 mg | ORAL_CAPSULE | Freq: Three times a day (TID) | ORAL | 0 refills | Status: DC | PRN

## 2021-05-08 MED ORDER — PREDNISONE 10 MG (21) PO TBPK: ORAL_TABLET | ORAL | 0 refills | Status: DC

## 2021-05-08 MED ORDER — PROMETHAZINE-DM 6.25-15 MG/5ML PO SYRP: 5.0000 mL | ORAL_SOLUTION | Freq: Four times a day (QID) | ORAL | 0 refills | Status: DC | PRN

## 2021-05-08 NOTE — Progress Notes (Signed)
Virtual Visit Consent   Erin Zavala, you are scheduled for a virtual visit with a [facility] provider today.     Just as with appointments in the office, your consent must be obtained to participate.  Your consent will be active for this visit and any virtual visit you may have with one of our providers in the next 365 days.     If you have a MyChart account, a copy of this consent can be sent to you electronically.  All virtual visits are billed to your insurance company just like a traditional visit in the office.    As this is a virtual visit, video technology does not allow for your provider to perform a traditional examination.  This may limit your provider's ability to fully assess your condition.  If your provider identifies any concerns that need to be evaluated in person or the need to arrange testing (such as labs, EKG, etc.), we will make arrangements to do so.     Although advances in technology are sophisticated, we cannot ensure that it will always work on either your end or our end.  If the connection with a video visit is poor, the visit may have to be switched to a telephone visit.  With either a video or telephone visit, we are not always able to ensure that we have a secure connection.     I need to obtain your verbal consent now.   Are you willing to proceed with your visit today?    Erin Zavala has provided verbal consent on 05/08/2021 for a virtual visit (video or telephone).   Mar Daring, PA-C   Date: 05/08/2021 10:00 AM   Virtual Visit via Video Note   I, Mar Daring, connected with  Erin Zavala  ([id number], Nov 09, 1965) on 05/08/21 at 10:00 AM EST by a video-enabled telemedicine application and verified that I am speaking with the correct person using two identifiers.  Location: Patient: Virtual Visit Location Patient: Home Provider: Virtual Visit Location Provider: Home Office   I discussed the limitations of evaluation and management by  telemedicine and the availability of in person appointments. The patient expressed understanding and agreed to proceed.    History of Present Illness: Erin Zavala is a 56 y.o. who identifies as a female who was assigned female at birth, and is being seen today for URI symptoms.  HPI: URI  This is a new problem. The current episode started in the past 7 days (Thursday last week). The problem has been gradually worsening. There has been no fever. Associated symptoms include congestion, coughing, headaches, nausea, rhinorrhea (and post nasal drainage), sinus pain and wheezing (feels it more in the throat/stridor). Pertinent negatives include no diarrhea, ear pain, plugged ear sensation, sore throat or vomiting. Associated symptoms comments: Deep voice. She has tried decongestant, sleep, increased fluids and acetaminophen (Mucinex) for the symptoms. The treatment provided no relief.   On home O2.  Problems:  Patient Active Problem List   Diagnosis Date Noted   Allergic rhinitis 06/16/2020   COPD (chronic obstructive pulmonary disease) ([place]) 09/18/2018   Cough 04/04/2018   Thrush 01/02/2018   Chronic respiratory failure with hypoxia ([place]) 08/19/2017   Coronary artery disease 07/23/2016   Health care maintenance 06/11/2016   Sinus congestion 03/23/2016   Hypoxemia 03/23/2016   Pulmonary hypertension ([place]) 12/28/2015   Acute bronchitis 12/28/2015   Childhood asthma    Pulmonary sarcoidosis ([place]) 11/02/2015   GERD (gastroesophageal reflux disease) 11/02/2015  Allergies: No Known Allergies Medications:  Current Outpatient Medications:    amoxicillin-clavulanate (AUGMENTIN) 875-125 MG tablet, Take 1 tablet by mouth 2 (two) times daily., Disp: 14 tablet, Rfl: 0   benzonatate (TESSALON) 100 MG capsule, Take 1 capsule (100 mg total) by mouth 3 (three) times daily as needed., Disp: 30 capsule, Rfl: 0   predniSONE (STERAPRED UNI-PAK 21 TAB) 10 MG (21) TBPK tablet, 6 day taper; take as directed  on package instructions, Disp: 21 tablet, Rfl: 0   promethazine-dextromethorphan (PROMETHAZINE-DM) 6.25-15 MG/5ML syrup, Take 5 mLs by mouth 4 (four) times daily as needed., Disp: 118 mL, Rfl: 0   ADCIRCA 20 MG tablet, Take 2 tablets (40 mg total) by mouth daily. TAKE 2 TABLETS BY MOUTH 1 TIME A DAY., Disp: 180 tablet, Rfl: 3   albuterol (VENTOLIN HFA) 108 (90 Base) MCG/ACT inhaler, INHALE 2 PUFFS INTO THE LUNGS EVERY 4 HOURS AS NEEDED FOR WHEEZE OR FOR SHORTNESS OF BREATH, Disp: 18 each, Rfl: 2   aspirin 81 MG tablet, Take 1 tablet (81 mg total) by mouth daily., Disp: 30 tablet, Rfl: 0   atorvastatin (LIPITOR) 80 MG tablet, Take 1 tablet (80 mg total) by mouth daily., Disp: 90 tablet, Rfl: 3   cetirizine (ZYRTEC) 10 MG tablet, Take 1 tablet (10 mg total) by mouth daily., Disp: 90 tablet, Rfl: 3   esomeprazole (NEXIUM) 40 MG capsule, TAKE 1 CAPSULE EVERY DAY, Disp: 90 capsule, Rfl: 3   ferrous sulfate 325 (65 FE) MG tablet, TAKE 1 TABLET (325 MG TOTAL) BY MOUTH 2 (TWO) TIMES DAILY WITH A MEAL., Disp: 60 tablet, Rfl: 5   fluticasone (FLONASE) 50 MCG/ACT nasal spray, Place 2 sprays into both nostrils daily., Disp: 16 g, Rfl: 2   fluticasone furoate-vilanterol (BREO ELLIPTA) 200-25 MCG/INH AEPB, Inhale 1 puff into the lungs daily., Disp: 90 each, Rfl: 1   guaiFENesin (MUCINEX) 600 MG 12 hr tablet, Take 600 mg by mouth 2 (two) times daily as needed. , Disp: , Rfl:    ipratropium-albuterol (DUONEB) 0.5-2.5 (3) MG/3ML SOLN, TAKE 3 MLS BY NEBULIZATION EVERY 6 (SIX) HOURS AS NEEDED., Disp: 360 mL, Rfl: 10   LETAIRIS 10 MG tablet, TAKE 1 TABLET BY MOUTH ONCE DAILY, Disp: 30 tablet, Rfl: 11   OXYGEN, 2-3 lpm 24/7  AHC, Disp: , Rfl:    predniSONE (DELTASONE) 5 MG tablet, Take 0.5-1 tablets (2.5-5 mg total) by mouth daily with breakfast., Disp: 30 tablet, Rfl: 11   Treprostinil (TYVASO DPI TITRATION KIT) 16 & 32 & 48 MCG POWD, Inhale 16 mcg into the lungs in the morning, at noon, in the evening, and at bedtime.,  Disp: 112 each, Rfl: 11  Observations/Objective: Patient is well-developed, well-nourished in no acute distress.  Resting comfortably at home.  Head is normocephalic, atraumatic.  No labored breathing.  Speech is clear and coherent with logical content.  Patient is alert and oriented at baseline.    Assessment and Plan: 1. Bacterial upper respiratory infection - amoxicillin-clavulanate (AUGMENTIN) 875-125 MG tablet; Take 1 tablet by mouth 2 (two) times daily.  Dispense: 14 tablet; Refill: 0 - predniSONE (STERAPRED UNI-PAK 21 TAB) 10 MG (21) TBPK tablet; 6 day taper; take as directed on package instructions  Dispense: 21 tablet; Refill: 0 - promethazine-dextromethorphan (PROMETHAZINE-DM) 6.25-15 MG/5ML syrup; Take 5 mLs by mouth 4 (four) times daily as needed.  Dispense: 118 mL; Refill: 0 - benzonatate (TESSALON) 100 MG capsule; Take 1 capsule (100 mg total) by mouth 3 (three) times daily as needed.  Dispense: 30 capsule; Refill: 0  - Worsening symptoms that have not responded to OTC medications and patient high risk for bacterial infection due to sarcoidosis - Will give augmentin, prednisone, tessalon perles and promethazine dm - Continue allergy medications.  - Steam treatments and humidifier can help - Stay well hydrated and get plenty of rest.  - Seek in person evaluation if no symptom improvement or if symptoms worsen.   Follow Up Instructions: I discussed the assessment and treatment plan with the patient. The patient was provided an opportunity to ask questions and all were answered. The patient agreed with the plan and demonstrated an understanding of the instructions.  A copy of instructions were sent to the patient via MyChart unless otherwise noted below.   The patient was advised to call back or seek an in-person evaluation if the symptoms worsen or if the condition fails to improve as anticipated.  Time:  I spent 12 minutes with the patient via telehealth technology  discussing the above problems/concerns.    Mar Daring, PA-C

## 2021-05-08 NOTE — Patient Instructions (Signed)
Erin Zavala, thank you for joining Mar Daring, PA-C for today's virtual visit.  While this provider is not your primary care provider (PCP), if your PCP is located in our provider database this encounter information will be shared with them immediately following your visit.  Consent: (Patient) Erin Zavala provided verbal consent for this virtual visit at the beginning of the encounter.  Current Medications:  Current Outpatient Medications:    amoxicillin-clavulanate (AUGMENTIN) 875-125 MG tablet, Take 1 tablet by mouth 2 (two) times daily., Disp: 14 tablet, Rfl: 0   benzonatate (TESSALON) 100 MG capsule, Take 1 capsule (100 mg total) by mouth 3 (three) times daily as needed., Disp: 30 capsule, Rfl: 0   predniSONE (STERAPRED UNI-PAK 21 TAB) 10 MG (21) TBPK tablet, 6 day taper; take as directed on package instructions, Disp: 21 tablet, Rfl: 0   promethazine-dextromethorphan (PROMETHAZINE-DM) 6.25-15 MG/5ML syrup, Take 5 mLs by mouth 4 (four) times daily as needed., Disp: 118 mL, Rfl: 0   ADCIRCA 20 MG tablet, Take 2 tablets (40 mg total) by mouth daily. TAKE 2 TABLETS BY MOUTH 1 TIME A DAY., Disp: 180 tablet, Rfl: 3   albuterol (VENTOLIN HFA) 108 (90 Base) MCG/ACT inhaler, INHALE 2 PUFFS INTO THE LUNGS EVERY 4 HOURS AS NEEDED FOR WHEEZE OR FOR SHORTNESS OF BREATH, Disp: 18 each, Rfl: 2   aspirin 81 MG tablet, Take 1 tablet (81 mg total) by mouth daily., Disp: 30 tablet, Rfl: 0   atorvastatin (LIPITOR) 80 MG tablet, Take 1 tablet (80 mg total) by mouth daily., Disp: 90 tablet, Rfl: 3   cetirizine (ZYRTEC) 10 MG tablet, Take 1 tablet (10 mg total) by mouth daily., Disp: 90 tablet, Rfl: 3   esomeprazole (NEXIUM) 40 MG capsule, TAKE 1 CAPSULE EVERY DAY, Disp: 90 capsule, Rfl: 3   ferrous sulfate 325 (65 FE) MG tablet, TAKE 1 TABLET (325 MG TOTAL) BY MOUTH 2 (TWO) TIMES DAILY WITH A MEAL., Disp: 60 tablet, Rfl: 5   fluticasone (FLONASE) 50 MCG/ACT nasal spray, Place 2 sprays into both  nostrils daily., Disp: 16 g, Rfl: 2   fluticasone furoate-vilanterol (BREO ELLIPTA) 200-25 MCG/INH AEPB, Inhale 1 puff into the lungs daily., Disp: 90 each, Rfl: 1   guaiFENesin (MUCINEX) 600 MG 12 hr tablet, Take 600 mg by mouth 2 (two) times daily as needed. , Disp: , Rfl:    ipratropium-albuterol (DUONEB) 0.5-2.5 (3) MG/3ML SOLN, TAKE 3 MLS BY NEBULIZATION EVERY 6 (SIX) HOURS AS NEEDED., Disp: 360 mL, Rfl: 10   LETAIRIS 10 MG tablet, TAKE 1 TABLET BY MOUTH ONCE DAILY, Disp: 30 tablet, Rfl: 11   OXYGEN, 2-3 lpm 24/7  AHC, Disp: , Rfl:    predniSONE (DELTASONE) 5 MG tablet, Take 0.5-1 tablets (2.5-5 mg total) by mouth daily with breakfast., Disp: 30 tablet, Rfl: 11   Treprostinil (TYVASO DPI TITRATION KIT) 16 & 32 & 48 MCG POWD, Inhale 16 mcg into the lungs in the morning, at noon, in the evening, and at bedtime., Disp: 112 each, Rfl: 11   Medications ordered in this encounter:  Meds ordered this encounter  Medications   amoxicillin-clavulanate (AUGMENTIN) 875-125 MG tablet    Sig: Take 1 tablet by mouth 2 (two) times daily.    Dispense:  14 tablet    Refill:  0    Order Specific Question:   Supervising Provider    Answer:   MILLER, BRIAN [[id number]]   predniSONE (STERAPRED UNI-PAK 21 TAB) 10 MG (21) TBPK tablet    Sig:  6 day taper; take as directed on package instructions    Dispense:  21 tablet    Refill:  0    Order Specific Question:   Supervising Provider    Answer:   Noemi Chapel [[id number]]   promethazine-dextromethorphan (PROMETHAZINE-DM) 6.25-15 MG/5ML syrup    Sig: Take 5 mLs by mouth 4 (four) times daily as needed.    Dispense:  118 mL    Refill:  0    Order Specific Question:   Supervising Provider    Answer:   MILLER, BRIAN [[id number]]   benzonatate (TESSALON) 100 MG capsule    Sig: Take 1 capsule (100 mg total) by mouth 3 (three) times daily as needed.    Dispense:  30 capsule    Refill:  0    Order Specific Question:   Supervising Provider    Answer:   Sabra Heck, [place]      *If you need refills on other medications prior to your next appointment, please contact your pharmacy*  Follow-Up: Call back or seek an in-person evaluation if the symptoms worsen or if the condition fails to improve as anticipated.  Other Instructions Sinusitis, Adult Sinusitis is inflammation of your sinuses. Sinuses are hollow spaces in the bones around your face. Your sinuses are located: Around your eyes. In the middle of your forehead. Behind your nose. In your cheekbones. Mucus normally drains out of your sinuses. When your nasal tissues become inflamed or swollen, mucus can become trapped or blocked. This allows bacteria, viruses, and fungi to grow, which leads to infection. Most infections of the sinuses are caused by a virus. Sinusitis can develop quickly. It can last for up to 4 weeks (acute) or for more than 12 weeks (chronic). Sinusitis often develops after a cold. What are the causes? This condition is caused by anything that creates swelling in the sinuses or stops mucus from draining. This includes: Allergies. Asthma. Infection from bacteria or viruses. Deformities or blockages in your nose or sinuses. Abnormal growths in the nose (nasal polyps). Pollutants, such as chemicals or irritants in the air. Infection from fungi (rare). What increases the risk? You are more likely to develop this condition if you: Have a weak body defense system (immune system). Do a lot of swimming or diving. Overuse nasal sprays. Smoke. What are the signs or symptoms? The main symptoms of this condition are pain and a feeling of pressure around the affected sinuses. Other symptoms include: Stuffy nose or congestion. Thick drainage from your nose. Swelling and warmth over the affected sinuses. Headache. Upper toothache. A cough that may get worse at night. Extra mucus that collects in the throat or the back of the nose (postnasal drip). Decreased sense of smell and  taste. Fatigue. A fever. Sore throat. Bad breath. How is this diagnosed? This condition is diagnosed based on: Your symptoms. Your medical history. A physical exam. Tests to find out if your condition is acute or chronic. This may include: Checking your nose for nasal polyps. Viewing your sinuses using a device that has a light (endoscope). Testing for allergies or bacteria. Imaging tests, such as an MRI or CT scan. In rare cases, a bone biopsy may be done to rule out more serious types of fungal sinus disease. How is this treated? Treatment for sinusitis depends on the cause and whether your condition is chronic or acute. If caused by a virus, your symptoms should go away on their own within 10 days. You may be given medicines to  relieve symptoms. They include: Medicines that shrink swollen nasal passages (topical intranasal decongestants). Medicines that treat allergies (antihistamines). A spray that eases inflammation of the nostrils (topical intranasal corticosteroids). Rinses that help get rid of thick mucus in your nose (nasal saline washes). If caused by bacteria, your health care provider may recommend waiting to see if your symptoms improve. Most bacterial infections will get better without antibiotic medicine. You may be given antibiotics if you have: A severe infection. A weak immune system. If caused by narrow nasal passages or nasal polyps, you may need to have surgery. Follow these instructions at home: Medicines Take, use, or apply over-the-counter and prescription medicines only as told by your health care provider. These may include nasal sprays. If you were prescribed an antibiotic medicine, take it as told by your health care provider. Do not stop taking the antibiotic even if you start to feel better. Hydrate and humidify  Drink enough fluid to keep your urine pale yellow. Staying hydrated will help to thin your mucus. Use a cool mist humidifier to keep the  humidity level in your home above 50%. Inhale steam for 10-15 minutes, 3-4 times a day, or as told by your health care provider. You can do this in the bathroom while a hot shower is running. Limit your exposure to cool or dry air. Rest Rest as much as possible. Sleep with your head raised (elevated). Make sure you get enough sleep each night. General instructions  Apply a warm, moist washcloth to your face 3-4 times a day or as told by your health care provider. This will help with discomfort. Wash your hands often with soap and water to reduce your exposure to germs. If soap and water are not available, use hand sanitizer. Do not smoke. Avoid being around people who are smoking (secondhand smoke). Keep all follow-up visits as told by your health care provider. This is important. Contact a health care provider if: You have a fever. Your symptoms get worse. Your symptoms do not improve within 10 days. Get help right away if: You have a severe headache. You have persistent vomiting. You have severe pain or swelling around your face or eyes. You have vision problems. You develop confusion. Your neck is stiff. You have trouble breathing. Summary Sinusitis is soreness and inflammation of your sinuses. Sinuses are hollow spaces in the bones around your face. This condition is caused by nasal tissues that become inflamed or swollen. The swelling traps or blocks the flow of mucus. This allows bacteria, viruses, and fungi to grow, which leads to infection. If you were prescribed an antibiotic medicine, take it as told by your health care provider. Do not stop taking the antibiotic even if you start to feel better. Keep all follow-up visits as told by your health care provider. This is important. This information is not intended to replace advice given to you by your health care provider. Make sure you discuss any questions you have with your health care provider. Document Revised: 07/29/2017  Document Reviewed: 07/29/2017 Elsevier Patient Education  2022 [employer].    If you have been instructed to have an in-person evaluation today at a local Urgent Care facility, please use the link below. It will take you to a list of all of our available [facility], including address, phone number and hours of operation. Please do not delay care.  [facility]  If you or a family member do not have a primary care  provider, use the link below to schedule a visit and establish care. When you choose a [facility] primary care physician or advanced practice provider, you gain a long-term partner in health. Find a Primary Care Provider  Learn more about [facility]'s in-office and virtual care options: [facility]

## 2021-05-09 ENCOUNTER — Other Ambulatory Visit (HOSPITAL_COMMUNITY): Payer: Self-pay

## 2021-05-09 ENCOUNTER — Telehealth (HOSPITAL_COMMUNITY): Payer: Self-pay | Admitting: Pharmacy Technician

## 2021-05-09 ENCOUNTER — Ambulatory Visit: Payer: 59 | Admitting: Emergency Medicine

## 2021-05-09 NOTE — Telephone Encounter (Signed)
Patient Advocate Encounter   Received notification from [facility] that prior authorization for [employer]) is required.   PA submitted on CoverMyMeds Key [id number] Status is pending   Will continue to follow.

## 2021-05-09 NOTE — Telephone Encounter (Signed)
Advanced Heart Failure Patient Advocate Encounter  Prior Authorization for [employer]) has been approved.    PA# [id number] Effective dates: 05/09/21 through 03/11/22  Charlann Boxer, CPhT

## 2021-05-22 NOTE — Telephone Encounter (Signed)
No response from pt. ? ?Closed referral ? ?

## 2021-05-24 ENCOUNTER — Telehealth: Payer: 59 | Admitting: Physician Assistant

## 2021-05-24 DIAGNOSIS — I10 Essential (primary) hypertension: Secondary | ICD-10-CM | POA: Diagnosis not present

## 2021-05-24 DIAGNOSIS — R519 Headache, unspecified: Secondary | ICD-10-CM | POA: Diagnosis not present

## 2021-05-24 NOTE — Patient Instructions (Signed)
?Erin Zavala, thank you for joining Erin Booze, Erin Zavala for today's virtual visit.  While this provider is not your primary care provider (PCP), if your PCP is located in our provider database this encounter information will be shared with them immediately following your visit. ? ?Consent: ?(Patient) Erin Zavala provided verbal consent for this virtual visit at the beginning of the encounter. ? ?Current Medications: ? ?Current Outpatient Medications:  ?  ADCIRCA 20 MG tablet, Take 2 tablets (40 mg total) by mouth daily. TAKE 2 TABLETS BY MOUTH 1 TIME A DAY., Disp: 180 tablet, Rfl: 3 ?  albuterol (VENTOLIN HFA) 108 (90 Base) MCG/ACT inhaler, INHALE 2 PUFFS INTO THE LUNGS EVERY 4 HOURS AS NEEDED FOR WHEEZE OR FOR SHORTNESS OF BREATH, Disp: 18 each, Rfl: 2 ?  amoxicillin-clavulanate (AUGMENTIN) 875-125 MG tablet, Take 1 tablet by mouth 2 (two) times daily., Disp: 14 tablet, Rfl: 0 ?  aspirin 81 MG tablet, Take 1 tablet (81 mg total) by mouth daily., Disp: 30 tablet, Rfl: 0 ?  atorvastatin (LIPITOR) 80 MG tablet, Take 1 tablet (80 mg total) by mouth daily., Disp: 90 tablet, Rfl: 3 ?  benzonatate (TESSALON) 100 MG capsule, Take 1 capsule (100 mg total) by mouth 3 (three) times daily as needed., Disp: 30 capsule, Rfl: 0 ?  cetirizine (ZYRTEC) 10 MG tablet, Take 1 tablet (10 mg total) by mouth daily., Disp: 90 tablet, Rfl: 3 ?  esomeprazole (NEXIUM) 40 MG capsule, TAKE 1 CAPSULE EVERY DAY, Disp: 90 capsule, Rfl: 3 ?  ferrous sulfate 325 (65 FE) MG tablet, TAKE 1 TABLET (325 MG TOTAL) BY MOUTH 2 (TWO) TIMES DAILY WITH A MEAL., Disp: 60 tablet, Rfl: 5 ?  fluticasone (FLONASE) 50 MCG/ACT nasal spray, Place 2 sprays into both nostrils daily., Disp: 16 g, Rfl: 2 ?  fluticasone furoate-vilanterol (BREO ELLIPTA) 200-25 MCG/INH AEPB, Inhale 1 puff into the lungs daily., Disp: 90 each, Rfl: 1 ?  guaiFENesin (MUCINEX) 600 MG 12 hr tablet, Take 600 mg by mouth 2 (two) times daily as needed. , Disp: , Rfl:  ?   ipratropium-albuterol (DUONEB) 0.5-2.5 (3) MG/3ML SOLN, TAKE 3 MLS BY NEBULIZATION EVERY 6 (SIX) HOURS AS NEEDED., Disp: 360 mL, Rfl: 10 ?  LETAIRIS 10 MG tablet, TAKE 1 TABLET BY MOUTH ONCE DAILY, Disp: 30 tablet, Rfl: 11 ?  OXYGEN, 2-3 lpm 24/7  AHC, Disp: , Rfl:  ?  predniSONE (DELTASONE) 5 MG tablet, Take 0.5-1 tablets (2.5-5 mg total) by mouth daily with breakfast., Disp: 30 tablet, Rfl: 11 ?  predniSONE (STERAPRED UNI-PAK 21 TAB) 10 MG (21) TBPK tablet, 6 day taper; take as directed on package instructions, Disp: 21 tablet, Rfl: 0 ?  promethazine-dextromethorphan (PROMETHAZINE-DM) 6.25-15 MG/5ML syrup, Take 5 mLs by mouth 4 (four) times daily as needed., Disp: 118 mL, Rfl: 0 ?  Treprostinil (TYVASO DPI TITRATION KIT) 16 & 32 & 48 MCG POWD, Inhale 16 mcg into the lungs in the morning, at noon, in the evening, and at bedtime., Disp: 112 each, Rfl: 11  ? ?Medications ordered in this encounter:  ?No orders of the defined types were placed in this encounter. ?  ? ?*If you need refills on other medications prior to your next appointment, please contact your pharmacy* ? ?Follow-Up: ?Call back or seek an in-person evaluation if the symptoms worsen or if the condition fails to improve as anticipated. ? ?Other Instructions ?Please seek evaluation in person for a physical exam ? ?If you have been instructed to have an in-person  evaluation today at a local Urgent Care facility, please use the link below. It will take you to a list of all of our available [facility], including address, phone number and hours of operation. Please do not delay care.  ?[facility] ? ?If you or a family member do not have a primary care provider, use the link below to schedule a visit and establish care. When you choose a [facility] primary care physician or advanced practice provider, you gain a long-term partner in health. ?Find a Primary Care Provider ? ?Learn more about [facility]'s in-office and virtual care  options: ?[facility] ? ?

## 2021-05-24 NOTE — Progress Notes (Signed)
Ms. Erin, Zavala are scheduled for a virtual visit with your provider today.   ? ?Just as we do with appointments in the office, we must obtain your consent to participate.  Your consent will be active for this visit and any virtual visit you may have with one of our providers in the next 365 days.   ? ?If you have a MyChart account, I can also send a copy of this consent to you electronically.  All virtual visits are billed to your insurance company just like a traditional visit in the office.  As this is a virtual visit, video technology does not allow for your provider to perform a traditional examination.  This may limit your provider's ability to fully assess your condition.  If your provider identifies any concerns that need to be evaluated in person or the need to arrange testing such as labs, EKG, etc, we will make arrangements to do so.   ? ?Although advances in technology are sophisticated, we cannot ensure that it will always work on either your end or our end.  If the connection with a video visit is poor, we may have to switch to a telephone visit.  With either a video or telephone visit, we are not always able to ensure that we have a secure connection.   I need to obtain your verbal consent now.   Are you willing to proceed with your visit today?  ? ?[name] has provided verbal consent on 05/24/2021 for a virtual visit (video or telephone). ? ? ?[place], PA-C ?05/24/2021  7:55 PM ? ? ?Date:  05/24/2021  ? ?ID:  Erin Zavala, DOB 20-Jul-1965, MRN [id number] ? ?Patient Location: Home ?Provider Location: Home Office ? ? ?Participants: Patient and Provider for Visit and Wrap up ? ?Method of visit: Video  ?Location of Patient: Home ?Location of Provider: Home Office ?Consent was obtain for visit over the video. ?Services rendered by provider: Visit was performed via video ? ?A video enabled telemedicine application was used and I verified that I am speaking with the correct person using two  identifiers. ? ?PCP:  Patient, No Pcp Per (Inactive)  ? ?Chief Complaint:  HTN ? ?History of Present Illness:   ? ?Erin Zavala is a 56 y.o. female with history as stated below. ?Presents video telehealth for an acute care visit ? ?States she had some nausea and a headache earlier today. She though she may have an elevated BP so she started taking it every 30 minutes. At the highest her BP was 180/97. One hour ago is was 145/87. She does not have a h/o HTN. Her current BP at the time of the visit is 157/70. ? ?She has had continued headaches today. She has had an upper respiratory infection recently and was on antibiotics, tessalon and promethazine. She denies chest pain, sob, unilateral numbness, weakness, or vision changes.  ? ? ?Past Medical, Surgical, Social History, Allergies, and Medications have been Reviewed. ? ?Past Medical History:  ?Diagnosis Date  ? Asthma   ? as a child  ? CHF (congestive heart failure) ([place])   ? Cough   ? Dyspnea   ? GERD (gastroesophageal reflux disease)   ? Migraines   ? Oxygen dependent   ? Pulmonary hypertension ([place])   ? Sarcoidosis   ? ? ?No outpatient medications have been marked as taking for the 05/24/21 encounter (Video Visit) with [place].  ?  ? ?Allergies:   Patient has no known allergies.  ? ?  ROS ?See HPI for history of present illness. ? ?Physical Exam ?Constitutional:   ?   Appearance: Normal appearance.  ?Neurological:  ?   Mental Status: She is alert.  ?   Comments: Clear speech, no facial droop, moving bue.   ? ?MDM: pt c/o HTN and a headache. I am unable to complete a full neurologic exam at this time to r/o ICH or other complication from HTN. I recommended she be seen for an in person evaluation ?          ? ?There are no diagnoses linked to this encounter. ? ? ?Time:   ?Today, I have spent 10 minutes with the patient with telehealth technology discussing the above problems, reviewing the chart, previous notes, medications and orders.  ? ? ?Tests  Ordered: ?No orders of the defined types were placed in this encounter. ? ? ?Medication Changes: ?No orders of the defined types were placed in this encounter. ? ? ? ?Disposition:  Follow up  ?Signed, ?[place], PA-C  ?05/24/2021 7:55 PM    ? ? ?

## 2021-05-30 ENCOUNTER — Encounter: Payer: Self-pay | Admitting: Emergency Medicine

## 2021-05-30 ENCOUNTER — Other Ambulatory Visit: Payer: Self-pay

## 2021-05-30 ENCOUNTER — Ambulatory Visit: Payer: 59 | Admitting: Emergency Medicine

## 2021-05-30 DIAGNOSIS — D860 Sarcoidosis of lung: Secondary | ICD-10-CM

## 2021-05-30 DIAGNOSIS — R0981 Nasal congestion: Secondary | ICD-10-CM

## 2021-05-30 DIAGNOSIS — J209 Acute bronchitis, unspecified: Secondary | ICD-10-CM | POA: Diagnosis not present

## 2021-05-30 DIAGNOSIS — J449 Chronic obstructive pulmonary disease, unspecified: Secondary | ICD-10-CM | POA: Diagnosis not present

## 2021-05-30 DIAGNOSIS — I272 Pulmonary hypertension, unspecified: Secondary | ICD-10-CM

## 2021-05-30 NOTE — Addendum Note (Signed)
Addended by: Fran Lowes on: 05/30/2021 09:49 AM ? ? Modules accepted: Orders ? ?

## 2021-05-30 NOTE — Progress Notes (Signed)
? ?Subjective:  ? ? Patient ID: Erin Zavala, female    DOB: 03-08-1966, 57 y.o.   MRN: [id number] ? ?Shortness of Breath ?Pertinent negatives include no abdominal pain, chest pain, leg swelling, rhinorrhea or wheezing.  ? ?ROV 11/09/20 --follow-up visit for 56 year old woman with sarcoidosis and associated ILD, secondary pulmonary hypertension.  Also ocular involvement.  She is on chronic prednisone 10 mg daily as well as a PAH regimen: Ralene Cork, [place].  Maintained on oxygen 1-3 L/min.  I tried decreasing her prednisone to 5 mg daily earlier this year - she tolerated but then needed a taper from 49m back down to 542magain. ? Whether it was seasonal because she has flared at the same time of year before.  She was [facility] an [facility], [facility] and she reports that she prefers it, is getting more benefit. Some cough with clear to white. Mostly nasal drainage. She is on flonase, zyrtec.  ? ?ROV 05/30/21 --[age]ear old woman with a history of sarcoidosis and associated interstitial lung disease on chronic prednisone 5 mg daily.  She also has ocular involvement, associated secondary pulmonary hypertension this been managed on targeted therapy (AJoanie CoddingtonLeLuvenia Heller chronic hypoxemic respiratory failure on 3-4L/min. She has been out of Adcirca for a week, about to restart it.  ?Currently managed on Breo, fluticasone nasal spray, Zyrtec. Uses albuterol about 2x a week.  ?She was treated with a prednisone taper in November 2022. Again 2 weeks ago when she began to have increase nasal congestion, drainage. Still having rhinitis sx, but her flaring sx are better. Back to 32m32mred now.  ? ? ?Review of Systems  ?Constitutional:  Positive for unexpected weight change (WIth increased steroid dose). Negative for fatigue.  ?HENT:  Negative for rhinorrhea and sinus pain.   ?Eyes:  Negative for discharge and redness.  ?Respiratory:  Positive for shortness of breath. Negative for cough, chest tightness and  wheezing.   ?Cardiovascular:  Negative for chest pain, palpitations and leg swelling.  ?Gastrointestinal:  Negative for abdominal distention and abdominal pain.  ?Musculoskeletal:  Negative for myalgias.  ?Neurological:  Negative for light-headedness.  ? ?   ?Objective:  ?  ?Vitals:  ? 05/30/21 0911  ?BP: 124/72  ?Pulse: (!) 114  ?Temp: 98.3 ?F (36.8 ?C)  ?TempSrc: Oral  ?SpO2: 100%  ?Weight: 174 lb 3.2 oz (79 kg)  ?Height: 5' (1.524 m)  ? ?Gen: Pleasant, well-nourished, in no distress,  normal affect ? ?ENT: No lesions,  mouth clear,  oropharynx clear, no postnasal drip ? ?Neck: No JVD, no stridor ? ?Lungs: No use of accessory muscles, focal right inspiratory squeaks, no wheezes or crackles ? ?Cardiovascular: RRR, heart sounds normal, no murmur or gallops, no peripheral edema ? ?Musculoskeletal: No deformities, no cyanosis or clubbing ? ?Neuro: alert, awake, non focal ? ?Skin: Warm, no lesions or rash ? ?   ?Assessment & Plan:  ?Pulmonary sarcoidosis ([place] will plan to repeat your high-resolution CT scan of the chest in May 2023 to follow your sarcoidosis. ?We will plan to repeat your pulmonary function testing at your next visit in May 2023 ?Continue prednisone 5 mg once daily ?Follow with Dr. ByrLamonte Sakai May so we can review your CT scan and pulmonary function testing. ? ?COPD (chronic obstructive pulmonary disease) (HCCStonePlease continue Breo 1 inhalation once daily.  Rinse and gargle after using. ?Keep albuterol available to use 2 puffs when you needed for shortness of breath, chest tightness, wheezing. ? ?Acute  bronchitis ?Continue your oxygen at 3-4 L/min depending on your level of exertion. ? ? ?Pulmonary hypertension ([place]) ?Continue your [name] as managed by cardiology. ? ? ?Sinus congestion ?Flaring recently with the onset of spring allergy season.  Recent prednisone taper principally for Sinus symptoms.  Improving. ? ?Continue your fluticasone nasal spray, 2 sprays each nostril once  daily. ?Continue cetirizine once daily. ?Try starting nasal saline rinses (Nettie pot) once daily. ? ? ? ?Baltazar Apo, MD, PhD ?05/30/2021, 9:44 AM ?[facility] ?[phone] or if no answer before 7:00PM call [phone] ?For any issues after 7:00PM please call eLink [phone] ? ?

## 2021-05-30 NOTE — Assessment & Plan Note (Signed)
We will plan to repeat your high-resolution CT scan of the chest in May 2023 to follow your sarcoidosis. ?We will plan to repeat your pulmonary function testing at your next visit in May 2023 ?Continue prednisone 5 mg once daily ?Follow with Dr. [name] in May so we can review your CT scan and pulmonary function testing. ?

## 2021-05-30 NOTE — Assessment & Plan Note (Signed)
Please continue Breo 1 inhalation once daily.  Rinse and gargle after using. ?Keep albuterol available to use 2 puffs when you needed for shortness of breath, chest tightness, wheezing. ?

## 2021-05-30 NOTE — Patient Instructions (Addendum)
We will plan to repeat your high-resolution CT scan of the chest in May 2023 to follow your sarcoidosis. ?We will plan to repeat your pulmonary function testing at your next visit in May 2023 ?Continue prednisone 5 mg once daily ?Please continue Breo 1 inhalation once daily.  Rinse and gargle after using. ?Keep albuterol available to use 2 puffs when you needed for shortness of breath, chest tightness, wheezing. ?Continue your fluticasone nasal spray, 2 sprays each nostril once daily. ?Continue cetirizine once daily. ?Try starting nasal saline rinses (Nettie pot) once daily. ?Continue your oxygen at 3-4 L/min depending on your level of exertion. ?Continue your [name] as managed by cardiology. ?Follow with Dr. [name] in May so we can review your CT scan and pulmonary function testing. ?

## 2021-05-30 NOTE — Assessment & Plan Note (Signed)
Continue your oxygen at 3-4 L/min depending on your level of exertion. ? ?

## 2021-05-30 NOTE — Assessment & Plan Note (Signed)
Continue your [name] as managed by cardiology. ? ?

## 2021-05-30 NOTE — Assessment & Plan Note (Signed)
Flaring recently with the onset of spring allergy season.  Recent prednisone taper principally for Sinus symptoms.  Improving. ? ?Continue your fluticasone nasal spray, 2 sprays each nostril once daily. ?Continue cetirizine once daily. ?Try starting nasal saline rinses (Nettie pot) once daily. ? ?

## 2021-05-31 ENCOUNTER — Other Ambulatory Visit (HOSPITAL_COMMUNITY): Payer: Self-pay | Admitting: Cardiology

## 2021-05-31 ENCOUNTER — Other Ambulatory Visit: Payer: Self-pay | Admitting: Emergency Medicine

## 2021-06-01 ENCOUNTER — Other Ambulatory Visit (HOSPITAL_COMMUNITY): Payer: Self-pay

## 2021-06-01 ENCOUNTER — Telehealth (HOSPITAL_COMMUNITY): Payer: Self-pay | Admitting: Pharmacy Technician

## 2021-06-01 MED ORDER — ADCIRCA 20 MG PO TABS: 40.0000 mg | ORAL_TABLET | Freq: Every day | ORAL | 11 refills | Status: DC

## 2021-06-01 NOTE — Telephone Encounter (Signed)
Advanced Heart Failure Patient Advocate Encounter ? ?Patient left message stating that [place] needed an RX for Tadalafil in order to be able to ship to her. Looking at the provider's notes, the patient should be taking brand name Adcirca. Asked [employer] banker, corporate) to send a 30 day RX to [place]. The patient was terminated from the [place] assistance program due to not returning phone calls, and missing paperwork. The co-pay for [name] is about $3000. There is one grant open that we may be able to apply for on the patient's behalf. Would need income and household size information before being able to do so.  ? ?Called and left the patient a message.  ? ?

## 2021-06-06 ENCOUNTER — Other Ambulatory Visit (HOSPITAL_COMMUNITY): Payer: Self-pay

## 2021-06-07 ENCOUNTER — Other Ambulatory Visit (HOSPITAL_COMMUNITY): Payer: Self-pay

## 2021-06-07 ENCOUNTER — Telehealth (HOSPITAL_COMMUNITY): Payer: Self-pay | Admitting: Pharmacy Technician

## 2021-06-07 NOTE — Telephone Encounter (Signed)
Advanced Heart Failure Patient Advocate Encounter ? ?Received a notice from [place] that it is time to renew [place] assistance. Left voicemail for patient to call back and start the re-enrollment process.  ? ?Will go ahead and submit the prescriber's portion of the application as it is difficult at times to get patient to return calls. ? ? ?

## 2021-06-07 NOTE — Telephone Encounter (Signed)
Sent in prescriber portion via fax. Document scanned to chart.  ? ?

## 2021-06-07 NOTE — Telephone Encounter (Addendum)
Advanced Heart Failure Patient Advocate Encounter  ? ?Patient was approved to receive Adcirca from [place] ? ?Effective dates: 06/06/21 through up to one calendar year (since patient is Medicare recipient likely end date is 03/11/22). Document scanned to chart.  ? ?The medication will be dispensed by [facility], [phone]. ? ?Called and left the patient a detailed message.  ? ?Charlann Boxer, CPhT ? ?

## 2021-06-09 ENCOUNTER — Other Ambulatory Visit (HOSPITAL_COMMUNITY): Payer: Self-pay | Admitting: *Deleted

## 2021-06-09 MED ORDER — ADCIRCA 20 MG PO TABS: 40.0000 mg | ORAL_TABLET | Freq: Every day | ORAL | 11 refills | Status: DC

## 2021-06-15 ENCOUNTER — Other Ambulatory Visit (HOSPITAL_COMMUNITY): Payer: Self-pay

## 2021-06-21 ENCOUNTER — Other Ambulatory Visit (HOSPITAL_COMMUNITY): Payer: Self-pay

## 2021-06-21 ENCOUNTER — Telehealth (HOSPITAL_COMMUNITY): Payer: Self-pay | Admitting: Pharmacy Technician

## 2021-06-21 NOTE — Telephone Encounter (Signed)
Patient Advocate Encounter ?  ?Received notification from [facility] that prior authorization for Erin Zavala is required. ?  ?PA submitted on CoverMyMeds ?[place] ?Status is pending ?  ?Will continue to follow. ? ?

## 2021-06-22 ENCOUNTER — Other Ambulatory Visit (HOSPITAL_COMMUNITY): Payer: Self-pay

## 2021-06-23 ENCOUNTER — Telehealth (HOSPITAL_COMMUNITY): Payer: Self-pay | Admitting: *Deleted

## 2021-06-23 MED ORDER — AMBRISENTAN 10 MG PO TABS: 10.0000 mg | ORAL_TABLET | Freq: Every day | ORAL | 11 refills | Status: DC

## 2021-06-23 NOTE — Telephone Encounter (Signed)
Advanced Heart Failure Patient Advocate Encounter ? ?Prior Authorization for Ambrisentan has been approved.   ? ?PA# [id number] ?Effective dates: 06/23/21 through 06/23/23 ? ?Sent 30 day RX request to [facility] ([place]) to send to [place].  ? ?Charlann Boxer, CPhT ? ? ?

## 2021-06-23 NOTE — Telephone Encounter (Signed)
Advanced Heart Failure Patient Advocate Encounter ? ?Patient insurance prefers Ambrisentan before approving Letairis. PA submitted via fax, [phone]. Phone number for follow up, [phone]. ? ?[place] ID [id number] ?

## 2021-06-23 NOTE — Telephone Encounter (Signed)
Advanced Heart Failure Patient Advocate Encounter ? ?Patient is no longer qualified for the [employer] for [place] assistance due to having active insurance.  ? ?Working on [employer] for [employer].  ? ?Charlann Boxer, CPhT ? ?

## 2021-06-23 NOTE — Telephone Encounter (Signed)
Refill encounter ?

## 2021-06-26 ENCOUNTER — Telehealth (HOSPITAL_COMMUNITY): Payer: Self-pay | Admitting: Pharmacy Technician

## 2021-06-26 NOTE — Telephone Encounter (Signed)
Advanced Heart Failure Patient Advocate Encounter ? ?Patient called and left message stating that the insurance was waiting on the doctor's office to send information to approve PA for Letairis. The patient's insurance approved a PA for the generic of Letairis, Ambrisentan. I called to update the patient and had to leave a message. I have called the patient in the past in the hopes of obtaining a grant to help cover either of these medications. The patient did not return the call and the grants are now closed. We sent an updated RX of Ambrisentan to [facility] and they will reach out to the patient about co-pays. ? ?Detailed message left. Requested the patient call back to discuss.  ? ?Charlann Boxer, CPhT ? ?

## 2021-06-28 ENCOUNTER — Other Ambulatory Visit (HOSPITAL_COMMUNITY): Payer: Self-pay

## 2021-06-28 ENCOUNTER — Other Ambulatory Visit (HOSPITAL_COMMUNITY): Payer: Self-pay | Admitting: Pharmacist

## 2021-06-28 MED ORDER — AMBRISENTAN 10 MG PO TABS: 10.0000 mg | ORAL_TABLET | Freq: Every day | ORAL | 11 refills | Status: DC

## 2021-06-30 ENCOUNTER — Telehealth (HOSPITAL_COMMUNITY): Payer: Self-pay | Admitting: Cardiology

## 2021-06-30 NOTE — Telephone Encounter (Signed)
Noted ? ?

## 2021-06-30 NOTE — Telephone Encounter (Signed)
Erin Zavala with PA for letaris called to obtain additional information ? ?Please return call ?[phone] ? ? ? ?

## 2021-07-03 ENCOUNTER — Telehealth (HOSPITAL_COMMUNITY): Payer: Self-pay | Admitting: Pharmacy Technician

## 2021-07-03 NOTE — Telephone Encounter (Signed)
Advanced Heart Failure Patient Advocate Encounter ? ?Patient called and stated that she is out of Letairis and would like an update on the PA. I have reached out to the patient several times about the change in medication. The patient was approved for Ambrisentan and not Letairis.  ? ?Esther Hardy also reached out about the patient even though they have discontinued her from their program due to new active insurance. I called and gave them the same information that I have attempted to give to the patient. I asked for the representative to pass the information along if and when they get the patient on the phone. Representative agreed. ? ?Charlann Boxer, CPhT ? ?

## 2021-07-04 ENCOUNTER — Other Ambulatory Visit (HOSPITAL_COMMUNITY): Payer: Self-pay

## 2021-07-06 ENCOUNTER — Other Ambulatory Visit (HOSPITAL_COMMUNITY): Payer: Self-pay

## 2021-07-10 ENCOUNTER — Telehealth (HOSPITAL_COMMUNITY): Payer: Self-pay | Admitting: Pharmacist

## 2021-07-10 NOTE — Telephone Encounter (Signed)
Patient Advocate Encounter ?  ?Received notification from [facility] that appeal for Erin Zavala is required. Although generic ambrisentan is approved, patient wishes [place] to proceed with attempting to get approval for brand Letairis.  ?  ?Appeal has been faxed to [facility] ?  ?Will continue to follow. ? ? ?Audry Riles, PharmD, BCPS, BCCP, CPP ?Heart Failure Clinic Pharmacist ?[phone] ? ?

## 2021-07-17 ENCOUNTER — Other Ambulatory Visit (HOSPITAL_COMMUNITY): Payer: Self-pay

## 2021-07-19 ENCOUNTER — Other Ambulatory Visit (HOSPITAL_COMMUNITY): Payer: Self-pay

## 2021-07-19 NOTE — Telephone Encounter (Signed)
[employer] closed the patient's appeal due to needing an AOR Information systems manager of authorized representative) form completed.  ? ?Completed AOR and appeal were refaxed to [facility]. Fax #, [phone]. ? ?Call back number to check status, [phone]. ?

## 2021-07-21 NOTE — Telephone Encounter (Signed)
Advanced Heart Failure Patient Advocate Encounter ? ?[facility], representative confirmed that the appeal with AOR was received. The appeal is still open and in initial review. Attempted to expedite appeal review, was told that there was no way to do that now that the appeal was in initial review. Can take up to 30 days to receive a determination.  ?

## 2021-07-31 ENCOUNTER — Other Ambulatory Visit (HOSPITAL_COMMUNITY): Payer: Self-pay

## 2021-08-01 NOTE — Telephone Encounter (Signed)
Called [employer] to check the status of appeal. Representative stated that she would have to return my call. She needed to reach out to higher ups regarding a status update.

## 2021-08-08 ENCOUNTER — Ambulatory Visit (HOSPITAL_COMMUNITY): Payer: 59

## 2021-08-08 NOTE — Telephone Encounter (Signed)
Have called [employer] several times to obtain an update regarding Letairis appeal. Was informed today that on 08/02/21 the appeal was denied. The reasoning, "not medically necessary".   Requested a copy of the denial to be faxed to the office.

## 2021-08-11 ENCOUNTER — Other Ambulatory Visit (HOSPITAL_COMMUNITY): Payer: Self-pay

## 2021-08-11 ENCOUNTER — Telehealth (HOSPITAL_COMMUNITY): Payer: Self-pay | Admitting: Pharmacy Technician

## 2021-08-11 NOTE — Telephone Encounter (Signed)
Advanced Heart Failure Patient Advocate Encounter  Enrolled patient into [employer] REMS via REMS website.   Will call the patient to get signature on PSMN.

## 2021-08-11 NOTE — Telephone Encounter (Signed)
Advanced Heart Failure Patient Advocate Encounter  After receiving the denial for Letairis, the only other option that is available is a second level appeal. [facility]) and I are confident that the appeal would not be approved at this time. Spoke with both the provider and the patient about the possibility of starting [employer]. Both agreed to proceed with starting [employer] process.   Prior Authorization for [employer] has been submitted and approved.    PA# [id number] Effective dates: 08/11/21 through 08/11/23  Will work on getting REMS an PSMN sent in to the hub.  Charlann Boxer, CPhT

## 2021-08-16 NOTE — Telephone Encounter (Signed)
PSMN sent in via fax. Document scanned to chart.

## 2021-08-28 NOTE — Telephone Encounter (Signed)
Advanced Heart Failure Patient Advocate Encounter  Received confirmation from [place] that [place] first shipment will be delivered 08/29/21.  Charlann Boxer, CPhT

## 2021-09-06 ENCOUNTER — Telehealth: Payer: 59 | Admitting: Physician Assistant

## 2021-09-06 DIAGNOSIS — J019 Acute sinusitis, unspecified: Secondary | ICD-10-CM | POA: Diagnosis not present

## 2021-09-06 DIAGNOSIS — B9689 Other specified bacterial agents as the cause of diseases classified elsewhere: Secondary | ICD-10-CM | POA: Diagnosis not present

## 2021-09-06 MED ORDER — AMOXICILLIN-POT CLAVULANATE 875-125 MG PO TABS: 1.0000 | ORAL_TABLET | Freq: Two times a day (BID) | ORAL | 0 refills | Status: DC

## 2021-09-06 MED ORDER — PROMETHAZINE-DM 6.25-15 MG/5ML PO SYRP: 5.0000 mL | ORAL_SOLUTION | Freq: Four times a day (QID) | ORAL | 0 refills | Status: DC | PRN

## 2021-09-06 NOTE — Progress Notes (Signed)
Virtual Visit Consent   Erin Zavala, you are scheduled for a virtual visit with a [facility] provider today. Just as with appointments in the office, your consent must be obtained to participate. Your consent will be active for this visit and any virtual visit you may have with one of our providers in the next 365 days. If you have a MyChart account, a copy of this consent can be sent to you electronically.  As this is a virtual visit, video technology does not allow for your provider to perform a traditional examination. This may limit your provider's ability to fully assess your condition. If your provider identifies any concerns that need to be evaluated in person or the need to arrange testing (such as labs, EKG, etc.), we will make arrangements to do so. Although advances in technology are sophisticated, we cannot ensure that it will always work on either your end or our end. If the connection with a video visit is poor, the visit may have to be switched to a telephone visit. With either a video or telephone visit, we are not always able to ensure that we have a secure connection.  By engaging in this virtual visit, you consent to the provision of healthcare and authorize for your insurance to be billed (if applicable) for the services provided during this visit. Depending on your insurance coverage, you may receive a charge related to this service.  I need to obtain your verbal consent now. Are you willing to proceed with your visit today? Erin Zavala has provided verbal consent on 09/06/2021 for a virtual visit (video or telephone). Erin Zavala, [place]  Date: 09/06/2021 8:55 AM  Virtual Visit via Video Note   I, Erin Zavala, connected with  Modine Oppenheimer  ([id number], Nov 29, 1965) on 09/06/21 at  8:45 AM EDT by a video-enabled telemedicine application and verified that I am speaking with the correct person using two identifiers.  Location: Patient: Virtual Visit Location  Patient: Home Provider: Virtual Visit Location Provider: Home Office   I discussed the limitations of evaluation and management by telemedicine and the availability of in person appointments. The patient expressed understanding and agreed to proceed.    History of Present Illness: Erin Zavala is a 56 y.o. who identifies as a female who was assigned female at birth, and is being seen today for for possible sinusitis. Notes 2 weeks of nasal congestion and sinus pressure now with worsening sinud congestion, thick, sinus pain and some increased chest congestion. Denies fever, chills, chest pain. Is on chronic O2 and maintenance prednisone due to her Sarcoidosis, COPD. Denies any increased O2 requirement, low O2 sat or increased need of rescue inhalers. Has been taking Mucinex OTC to help with congestion.[name]  HPI: HPI  Problems:  Patient Active Problem List   Diagnosis Date Noted   Allergic rhinitis 06/16/2020   COPD (chronic obstructive pulmonary disease) ([place]) 09/18/2018   Cough 04/04/2018   Thrush 01/02/2018   Chronic respiratory failure with hypoxia ([place]) 08/19/2017   Coronary artery disease 07/23/2016   Health care maintenance 06/11/2016   Sinus congestion 03/23/2016   Hypoxemia 03/23/2016   Pulmonary hypertension ([place]) 12/28/2015   Acute bronchitis 12/28/2015   Childhood asthma    Pulmonary sarcoidosis ([place]) 11/02/2015   GERD (gastroesophageal reflux disease) 11/02/2015    Allergies: No Known Allergies Medications:  Current Outpatient Medications:    amoxicillin-clavulanate (AUGMENTIN) 875-125 MG tablet, Take 1 tablet by mouth 2 (two) times daily., Disp: 14 tablet, Rfl: 0  promethazine-dextromethorphan (PROMETHAZINE-DM) 6.25-15 MG/5ML syrup, Take 5 mLs by mouth 4 (four) times daily as needed for cough., Disp: 118 mL, Rfl: 0   ADCIRCA 20 MG tablet, Take 2 tablets (40 mg total) by mouth daily., Disp: 60 tablet, Rfl: 11   albuterol (VENTOLIN HFA) 108 (90 Base) MCG/ACT inhaler, INHALE  2 PUFFS INTO THE LUNGS EVERY 4 HOURS AS NEEDED FOR WHEEZE OR FOR SHORTNESS OF BREATH, Disp: 18 each, Rfl: 2   ambrisentan (LETAIRIS) 10 MG tablet, Take 1 tablet (10 mg total) by mouth daily., Disp: 30 tablet, Rfl: 11   aspirin 81 MG tablet, Take 1 tablet (81 mg total) by mouth daily., Disp: 30 tablet, Rfl: 0   atorvastatin (LIPITOR) 80 MG tablet, TAKE 1 TABLET EVERY DAY, Disp: 90 tablet, Rfl: 3   cetirizine (ZYRTEC) 10 MG tablet, TAKE 1 TABLET EVERY DAY, Disp: 90 tablet, Rfl: 3   esomeprazole (NEXIUM) 40 MG capsule, TAKE 1 CAPSULE EVERY DAY, Disp: 90 capsule, Rfl: 3   ferrous sulfate 325 (65 FE) MG tablet, TAKE 1 TABLET (325 MG TOTAL) BY MOUTH 2 (TWO) TIMES DAILY WITH A MEAL., Disp: 60 tablet, Rfl: 5   fluticasone (FLONASE) 50 MCG/ACT nasal spray, Place 2 sprays into both nostrils daily., Disp: 16 g, Rfl: 2   fluticasone furoate-vilanterol (BREO ELLIPTA) 200-25 MCG/INH AEPB, Inhale 1 puff into the lungs daily., Disp: 90 each, Rfl: 1   guaiFENesin (MUCINEX) 600 MG 12 hr tablet, Take 600 mg by mouth 2 (two) times daily as needed. , Disp: , Rfl:    ipratropium-albuterol (DUONEB) 0.5-2.5 (3) MG/3ML SOLN, TAKE 3 MLS BY NEBULIZATION EVERY 6 (SIX) HOURS AS NEEDED., Disp: 360 mL, Rfl: 10   OXYGEN, 2-3 lpm 24/7  AHC, Disp: , Rfl:    predniSONE (DELTASONE) 5 MG tablet, Take 0.5-1 tablets (2.5-5 mg total) by mouth daily with breakfast., Disp: 30 tablet, Rfl: 11   Treprostinil (TYVASO DPI TITRATION KIT) 16 & 32 & 48 MCG POWD, Inhale 16 mcg into the lungs in the morning, at noon, in the evening, and at bedtime., Disp: 112 each, Rfl: 11  Observations/Objective: Patient is well-developed, well-nourished in no acute distress.  Resting comfortably at home.  Head is normocephalic, atraumatic.  No labored breathing. Speech is clear and coherent with logical content.  Patient is alert and oriented at baseline.   Assessment and Plan: 1. Acute bacterial sinusitis - amoxicillin-clavulanate (AUGMENTIN) 875-125 MG  tablet; Take 1 tablet by mouth 2 (two) times daily.  Dispense: 14 tablet; Refill: 0 - promethazine-dextromethorphan (PROMETHAZINE-DM) 6.25-15 MG/5ML syrup; Take 5 mLs by mouth 4 (four) times daily as needed for cough.  Dispense: 118 mL; Refill: 0  Rx Augmentin.  Increase fluids.  Rest.  Saline nasal spray.  Probiotic.  Mucinex as directed.  Humidifier in bedroom. Promethazine-DM per orders for cough.  Call or return to clinic if symptoms are not improving.   Follow Up Instructions: I discussed the assessment and treatment plan with the patient. The patient was provided an opportunity to ask questions and all were answered. The patient agreed with the plan and demonstrated an understanding of the instructions.  A copy of instructions were sent to the patient via MyChart unless otherwise noted below.   The patient was advised to call back or seek an in-person evaluation if the symptoms worsen or if the condition fails to improve as anticipated.  Time:  I spent 10 minutes with the patient via telehealth technology discussing the above problems/concerns.    Erin Rio,  PA-C

## 2021-09-06 NOTE — Patient Instructions (Signed)
[name], thank you for joining [name], PA-C for today's virtual visit.  While this provider is not your primary care provider (PCP), if your PCP is located in our provider database this encounter information will be shared with them immediately following your visit.  Consent: (Patient) [name] provided verbal consent for this virtual visit at the beginning of the encounter.  Current Medications:  Current Outpatient Medications:    ADCIRCA 20 MG tablet, Take 2 tablets (40 mg total) by mouth daily., Disp: 60 tablet, Rfl: 11   albuterol (VENTOLIN HFA) 108 (90 Base) MCG/ACT inhaler, INHALE 2 PUFFS INTO THE LUNGS EVERY 4 HOURS AS NEEDED FOR WHEEZE OR FOR SHORTNESS OF BREATH, Disp: 18 each, Rfl: 2   ambrisentan (LETAIRIS) 10 MG tablet, Take 1 tablet (10 mg total) by mouth daily., Disp: 30 tablet, Rfl: 11   amoxicillin-clavulanate (AUGMENTIN) 875-125 MG tablet, Take 1 tablet by mouth 2 (two) times daily., Disp: 14 tablet, Rfl: 0   aspirin 81 MG tablet, Take 1 tablet (81 mg total) by mouth daily., Disp: 30 tablet, Rfl: 0   atorvastatin (LIPITOR) 80 MG tablet, TAKE 1 TABLET EVERY DAY, Disp: 90 tablet, Rfl: 3   benzonatate (TESSALON) 100 MG capsule, Take 1 capsule (100 mg total) by mouth 3 (three) times daily as needed., Disp: 30 capsule, Rfl: 0   cetirizine (ZYRTEC) 10 MG tablet, TAKE 1 TABLET EVERY DAY, Disp: 90 tablet, Rfl: 3   esomeprazole (NEXIUM) 40 MG capsule, TAKE 1 CAPSULE EVERY DAY, Disp: 90 capsule, Rfl: 3   ferrous sulfate 325 (65 FE) MG tablet, TAKE 1 TABLET (325 MG TOTAL) BY MOUTH 2 (TWO) TIMES DAILY WITH A MEAL., Disp: 60 tablet, Rfl: 5   fluticasone (FLONASE) 50 MCG/ACT nasal spray, Place 2 sprays into both nostrils daily., Disp: 16 g, Rfl: 2   fluticasone furoate-vilanterol (BREO ELLIPTA) 200-25 MCG/INH AEPB, Inhale 1 puff into the lungs daily., Disp: 90 each, Rfl: 1   guaiFENesin (MUCINEX) 600 MG 12 hr tablet, Take 600 mg by mouth 2 (two) times daily as needed.  , Disp: , Rfl:    ipratropium-albuterol (DUONEB) 0.5-2.5 (3) MG/3ML SOLN, TAKE 3 MLS BY NEBULIZATION EVERY 6 (SIX) HOURS AS NEEDED., Disp: 360 mL, Rfl: 10   OXYGEN, 2-3 lpm 24/7  AHC, Disp: , Rfl:    predniSONE (DELTASONE) 5 MG tablet, Take 0.5-1 tablets (2.5-5 mg total) by mouth daily with breakfast., Disp: 30 tablet, Rfl: 11   predniSONE (STERAPRED UNI-PAK 21 TAB) 10 MG (21) TBPK tablet, 6 day taper; take as directed on package instructions, Disp: 21 tablet, Rfl: 0   promethazine-dextromethorphan (PROMETHAZINE-DM) 6.25-15 MG/5ML syrup, Take 5 mLs by mouth 4 (four) times daily as needed., Disp: 118 mL, Rfl: 0   Treprostinil (TYVASO DPI TITRATION KIT) 16 & 32 & 48 MCG POWD, Inhale 16 mcg into the lungs in the morning, at noon, in the evening, and at bedtime., Disp: 112 each, Rfl: 11   Medications ordered in this encounter:  No orders of the defined types were placed in this encounter.    *If you need refills on other medications prior to your next appointment, please contact your pharmacy*  Follow-Up: Call back or seek an in-person evaluation if the symptoms worsen or if the condition fails to improve as anticipated.  Other Instructions Please take antibiotic as directed.  Increase fluid intake.  Use Saline nasal spray.  Take a daily multivitamin. Ok to continue Mucinex OTC. Continue your daily maintenance medication. Keep close watch on  oxygen levels -- if dropping you need an in-person evaluation ASAP. Use the cough syrup as directed.  Place a humidifier in the bedroom.  Please call or return clinic if symptoms are not improving.  Sinusitis Sinusitis is redness, soreness, and swelling (inflammation) of the paranasal sinuses. Paranasal sinuses are air pockets within the bones of your face (beneath the eyes, the middle of the forehead, or above the eyes). In healthy paranasal sinuses, mucus is able to drain out, and air is able to circulate through them by way of your nose. However, when your  paranasal sinuses are inflamed, mucus and air can become trapped. This can allow bacteria and other germs to grow and cause infection. Sinusitis can develop quickly and last only a short time (acute) or continue over a long period (chronic). Sinusitis that lasts for more than 12 weeks is considered chronic.  CAUSES  Causes of sinusitis include: Allergies. Structural abnormalities, such as displacement of the cartilage that separates your nostrils (deviated septum), which can decrease the air flow through your nose and sinuses and affect sinus drainage. Functional abnormalities, such as when the small hairs (cilia) that line your sinuses and help remove mucus do not work properly or are not present. SYMPTOMS  Symptoms of acute and chronic sinusitis are the same. The primary symptoms are pain and pressure around the affected sinuses. Other symptoms include: Upper toothache. Earache. Headache. Bad breath. Decreased sense of smell and taste. A cough, which worsens when you are lying flat. Fatigue. Fever. Thick drainage from your nose, which often is green and may contain pus (purulent). Swelling and warmth over the affected sinuses. DIAGNOSIS  Your caregiver will perform a physical exam. During the exam, your caregiver may: Look in your nose for signs of abnormal growths in your nostrils (nasal polyps). Tap over the affected sinus to check for signs of infection. View the inside of your sinuses (endoscopy) with a special imaging device with a light attached (endoscope), which is inserted into your sinuses. If your caregiver suspects that you have chronic sinusitis, one or more of the following tests may be recommended: Allergy tests. Nasal culture A sample of mucus is taken from your nose and sent to a lab and screened for bacteria. Nasal cytology A sample of mucus is taken from your nose and examined by your caregiver to determine if your sinusitis is related to an allergy. TREATMENT  Most  cases of acute sinusitis are related to a viral infection and will resolve on their own within 10 days. Sometimes medicines are prescribed to help relieve symptoms (pain medicine, decongestants, nasal steroid sprays, or saline sprays).  However, for sinusitis related to a bacterial infection, your caregiver will prescribe antibiotic medicines. These are medicines that will help kill the bacteria causing the infection.  Rarely, sinusitis is caused by a fungal infection. In theses cases, your caregiver will prescribe antifungal medicine. For some cases of chronic sinusitis, surgery is needed. Generally, these are cases in which sinusitis recurs more than 3 times per year, despite other treatments. HOME CARE INSTRUCTIONS  Drink plenty of water. Water helps thin the mucus so your sinuses can drain more easily. Use a humidifier. Inhale steam 3 to 4 times a day (for example, sit in the bathroom with the shower running). Apply a warm, moist washcloth to your face 3 to 4 times a day, or as directed by your caregiver. Use saline nasal sprays to help moisten and clean your sinuses. Take over-the-counter or prescription medicines for pain,  discomfort, or fever only as directed by your caregiver. SEEK IMMEDIATE MEDICAL CARE IF: You have increasing pain or severe headaches. You have nausea, vomiting, or drowsiness. You have swelling around your face. You have vision problems. You have a stiff neck. You have difficulty breathing. MAKE SURE YOU:  Understand these instructions. Will watch your condition. Will get help right away if you are not doing well or get worse. Document Released: 02/26/2005 Document Revised: 05/21/2011 Document Reviewed: 03/13/2011 [facility] Patient Information 2014 [place], [place].    If you have been instructed to have an in-person evaluation today at a local Urgent Care facility, please use the link below. It will take you to a list of all of our available [facility], including address, phone number and hours of operation. Please do not delay care.  [facility]  If you or a family member do not have a primary care provider, use the link below to schedule a visit and establish care. When you choose a [facility] primary care physician or advanced practice provider, you gain a long-term partner in health. Find a Primary Care Provider  Learn more about 's in-office and virtual care options: [facility]

## 2021-10-27 ENCOUNTER — Telehealth: Payer: 59 | Admitting: Family Medicine

## 2021-10-27 DIAGNOSIS — J440 Chronic obstructive pulmonary disease with acute lower respiratory infection: Secondary | ICD-10-CM

## 2021-10-27 DIAGNOSIS — J209 Acute bronchitis, unspecified: Secondary | ICD-10-CM | POA: Diagnosis not present

## 2021-10-27 MED ORDER — PROMETHAZINE-DM 6.25-15 MG/5ML PO SYRP: 5.0000 mL | ORAL_SOLUTION | Freq: Three times a day (TID) | ORAL | 0 refills | Status: DC | PRN

## 2021-10-27 MED ORDER — DOXYCYCLINE HYCLATE 100 MG PO TABS: 100.0000 mg | ORAL_TABLET | Freq: Two times a day (BID) | ORAL | 0 refills | Status: AC

## 2021-10-27 MED ORDER — FLUTICASONE PROPIONATE 50 MCG/ACT NA SUSP: 2.0000 | Freq: Every day | NASAL | 0 refills | Status: DC

## 2021-10-27 NOTE — Patient Instructions (Signed)
Erin Zavala, thank you for joining Erin Mayo, NP for today's virtual visit.  While this provider is not your primary care provider (PCP), if your PCP is located in our provider database this encounter information will be shared with them immediately following your visit.  Consent: (Patient) Erin Zavala provided verbal consent for this virtual visit at the beginning of the encounter.  Current Medications:  Current Outpatient Medications:    doxycycline (VIBRA-TABS) 100 MG tablet, Take 1 tablet (100 mg total) by mouth 2 (two) times daily for 10 days., Disp: 20 tablet, Rfl: 0   fluticasone (FLONASE) 50 MCG/ACT nasal spray, Place 2 sprays into both nostrils daily., Disp: 16 g, Rfl: 0   promethazine-dextromethorphan (PROMETHAZINE-DM) 6.25-15 MG/5ML syrup, Take 5 mLs by mouth 3 (three) times daily as needed for cough., Disp: 118 mL, Rfl: 0   ADCIRCA 20 MG tablet, Take 2 tablets (40 mg total) by mouth daily., Disp: 60 tablet, Rfl: 11   albuterol (VENTOLIN HFA) 108 (90 Base) MCG/ACT inhaler, INHALE 2 PUFFS INTO THE LUNGS EVERY 4 HOURS AS NEEDED FOR WHEEZE OR FOR SHORTNESS OF BREATH, Disp: 18 each, Rfl: 2   ambrisentan (LETAIRIS) 10 MG tablet, Take 1 tablet (10 mg total) by mouth daily., Disp: 30 tablet, Rfl: 11   aspirin 81 MG tablet, Take 1 tablet (81 mg total) by mouth daily., Disp: 30 tablet, Rfl: 0   atorvastatin (LIPITOR) 80 MG tablet, TAKE 1 TABLET EVERY DAY, Disp: 90 tablet, Rfl: 3   cetirizine (ZYRTEC) 10 MG tablet, TAKE 1 TABLET EVERY DAY, Disp: 90 tablet, Rfl: 3   esomeprazole (NEXIUM) 40 MG capsule, TAKE 1 CAPSULE EVERY DAY, Disp: 90 capsule, Rfl: 3   ferrous sulfate 325 (65 FE) MG tablet, TAKE 1 TABLET (325 MG TOTAL) BY MOUTH 2 (TWO) TIMES DAILY WITH A MEAL., Disp: 60 tablet, Rfl: 5   fluticasone furoate-vilanterol (BREO ELLIPTA) 200-25 MCG/INH AEPB, Inhale 1 puff into the lungs daily., Disp: 90 each, Rfl: 1   guaiFENesin (MUCINEX) 600 MG 12 hr tablet, Take 600 mg by mouth 2 (two)  times daily as needed. , Disp: , Rfl:    ipratropium-albuterol (DUONEB) 0.5-2.5 (3) MG/3ML SOLN, TAKE 3 MLS BY NEBULIZATION EVERY 6 (SIX) HOURS AS NEEDED., Disp: 360 mL, Rfl: 10   OXYGEN, 2-3 lpm 24/7  AHC, Disp: , Rfl:    predniSONE (DELTASONE) 5 MG tablet, Take 0.5-1 tablets (2.5-5 mg total) by mouth daily with breakfast., Disp: 30 tablet, Rfl: 11   Treprostinil (TYVASO DPI TITRATION KIT) 16 & 32 & 48 MCG POWD, Inhale 16 mcg into the lungs in the morning, at noon, in the evening, and at bedtime., Disp: 112 each, Rfl: 11   Medications ordered in this encounter:  Meds ordered this encounter  Medications   fluticasone (FLONASE) 50 MCG/ACT nasal spray    Sig: Place 2 sprays into both nostrils daily.    Dispense:  16 g    Refill:  0    Order Specific Question:   Supervising Provider    Answer:   MILLER, BRIAN [[id number]]   doxycycline (VIBRA-TABS) 100 MG tablet    Sig: Take 1 tablet (100 mg total) by mouth 2 (two) times daily for 10 days.    Dispense:  20 tablet    Refill:  0    Order Specific Question:   Supervising Provider    Answer:   Erin Zavala [[id number]]   promethazine-dextromethorphan (PROMETHAZINE-DM) 6.25-15 MG/5ML syrup    Sig: Take 5 mLs by mouth 3 (  three) times daily as needed for cough.    Dispense:  118 mL    Refill:  0    Order Specific Question:   Supervising Provider    Answer:   Erin Zavala, BRIAN [[id number]]     *If you need refills on other medications prior to your next appointment, please contact your pharmacy*  Follow-Up: Call back or seek an in-person evaluation if the symptoms worsen or if the condition fails to improve as anticipated.  Other Instructions Acute Bronchitis, Adult  Acute bronchitis is when air tubes in the lungs (bronchi) suddenly get swollen. The condition can make it hard for you to breathe. In adults, acute bronchitis usually goes away within 2 weeks. A cough caused by bronchitis may last up to 3 weeks. Smoking, allergies, and asthma can make the condition  worse. What are the causes? Germs that cause cold and flu (viruses). The most common cause of this condition is the virus that causes the common cold. Bacteria. Substances that bother (irritate) the lungs, including: Smoke from cigarettes and other types of tobacco. Dust and pollen. Fumes from chemicals, gases, or burned fuel. Indoor or outdoor air pollution. What increases the risk? A weak body's defense system. This is also called the immune system. Any condition that affects your lungs and breathing, such as asthma. What are the signs or symptoms? A cough. Coughing up clear, yellow, or green mucus. Making high-pitched whistling sounds when you breathe, most often when you breathe out (wheezing). Runny or stuffy nose. Having too much mucus in your lungs (chest congestion). Shortness of breath. Body aches. A sore throat. How is this treated? Acute bronchitis may go away over time without treatment. Your doctor may tell you to: Drink more fluids. This will help thin your mucus so it is easier to cough up. Use a device that gets medicine into your lungs (inhaler). Use a vaporizer or a humidifier. These are machines that add water to the air. This helps with coughing and poor breathing. Take a medicine that thins mucus and helps clear it from your lungs. Take a medicine that prevents or stops coughing. It is not common to take an antibiotic medicine for this condition. Follow these instructions at home:  Take over-the-counter and prescription medicines only as told by your doctor. Use an inhaler, vaporizer, or humidifier as told by your doctor. Take two teaspoons (10 mL) of honey at bedtime. This helps lessen your coughing at night. Drink enough fluid to keep your pee (urine) pale yellow. Do not smoke or use any products that contain nicotine or tobacco. If you need help quitting, ask your doctor. Get a lot of rest. Return to your normal activities when your doctor says that it is  safe. Keep all follow-up visits. How is this prevented?  Wash your hands often with soap and water for at least 20 seconds. If you cannot use soap and water, use hand sanitizer. Avoid contact with people who have cold symptoms. Try not to touch your mouth, nose, or eyes with your hands. Avoid breathing in smoke or chemical fumes. Make sure to get the flu shot every year. Contact a doctor if: Your symptoms do not get better in 2 weeks. You have trouble coughing up the mucus. Your cough keeps you awake at night. You have a fever. Get help right away if: You cough up blood. You have chest pain. You have very bad shortness of breath. You faint or keep feeling like you are going to faint. You  have a very bad headache. Your fever or chills get worse. These symptoms may be an emergency. Get help right away. Call your local emergency services (911 in the [place] Do not wait to see if the symptoms will go away. Do not drive yourself to the hospital. Summary Acute bronchitis is when air tubes in the lungs (bronchi) suddenly get swollen. In adults, acute bronchitis usually goes away within 2 weeks. Drink more fluids. This will help thin your mucus so it is easier to cough up. Take over-the-counter and prescription medicines only as told by your doctor. Contact a doctor if your symptoms do not improve after 2 weeks of treatment. This information is not intended to replace advice given to you by your health care provider. Make sure you discuss any questions you have with your health care provider. Document Revised: 06/29/2020 Document Reviewed: 06/29/2020 Elsevier Patient Education  [place].    If you have been instructed to have an in-person evaluation today at a local Urgent Care facility, please use the link below. It will take you to a list of all of our available [facility], including address, phone number and hours of operation. Please do not delay care.  [facility]  If you or a family member do not have a primary care provider, use the link below to schedule a visit and establish care. When you choose a [facility] primary care physician or advanced practice provider, you gain a long-term partner in health. Find a Primary Care Provider  Learn more about [facility]'s in-office and virtual care options: [facility]

## 2021-10-27 NOTE — Progress Notes (Signed)
Virtual Visit Consent   Erin Zavala, you are scheduled for a virtual visit with a [facility] provider today. Just as with appointments in the office, your consent must be obtained to participate. Your consent will be active for this visit and any virtual visit you may have with one of our providers in the next 365 days. If you have a MyChart account, a copy of this consent can be sent to you electronically.  As this is a virtual visit, video technology does not allow for your provider to perform a traditional examination. This may limit your provider's ability to fully assess your condition. If your provider identifies any concerns that need to be evaluated in person or the need to arrange testing (such as labs, EKG, etc.), we will make arrangements to do so. Although advances in technology are sophisticated, we cannot ensure that it will always work on either your end or our end. If the connection with a video visit is poor, the visit may have to be switched to a telephone visit. With either a video or telephone visit, we are not always able to ensure that we have a secure connection.  By engaging in this virtual visit, you consent to the provision of healthcare and authorize for your insurance to be billed (if applicable) for the services provided during this visit. Depending on your insurance coverage, you may receive a charge related to this service.  I need to obtain your verbal consent now. Are you willing to proceed with your visit today? Erin Zavala has provided verbal consent on 10/27/2021 for a virtual visit (video or telephone). Erin M Mills, NP  Date: 10/27/2021 9:45 AM  Virtual Visit via Video Note   I, Erin Zavala, connected with  Erin Zavala  ([id number], 12/19/1965) on 10/27/21 at  9:45 AM EDT by a video-enabled telemedicine application and verified that I am speaking with the correct person using two identifiers.  Location: Patient: Virtual Visit Location Patient:  Home Provider: Virtual Visit Location Provider: Home Office   I discussed the limitations of evaluation and management by telemedicine and the availability of in person appointments. The patient expressed understanding and agreed to proceed.    History of Present Illness: Erin Zavala is a 56 y.o. who identifies as a female who was assigned female at birth, and is being seen today for sinus infection.   HPI: Sinus Problem This is a new problem. The current episode started in the past 7 days. The problem has been waxing and waning since onset. There has been no fever. She is experiencing no pain. Associated symptoms include congestion, coughing, sinus pressure, sneezing and a sore throat. Pertinent negatives include no chills, diaphoresis, ear pain, headaches, hoarse voice, neck pain, shortness of breath or swollen glands. (No chest pain. Shortness of breath not above normal level- but feels like it could be worse.) Past treatments include oral decongestants. The treatment provided mild relief.    Has had sinus treatment with Augmentin on 09/06/2021. No increase in oxygen demand- she reports use of 2-3 liters pending need. Has not used rescue inhaler- which might attribute to worsening feeling Is trying mucinex without relief, and called her pulm DR  //Problems:  Patient Active Problem List   Diagnosis Date Noted   Allergic rhinitis 06/16/2020   COPD (chronic obstructive pulmonary disease) (HCC) 09/18/2018   Cough 04/04/2018   Thrush 01/02/2018   Chronic respiratory failure with hypoxia (HCC) 08/19/2017   Coronary artery disease 07/23/2016   Health care   maintenance 06/11/2016   Sinus congestion 03/23/2016   Hypoxemia 03/23/2016   Pulmonary hypertension ([place]) 12/28/2015   Acute bronchitis 12/28/2015   Childhood asthma    Pulmonary sarcoidosis ([place]) 11/02/2015   GERD (gastroesophageal reflux disease) 11/02/2015    Allergies: No Known Allergies Medications:  Current Outpatient  Medications:    ADCIRCA 20 MG tablet, Take 2 tablets (40 mg total) by mouth daily., Disp: 60 tablet, Rfl: 11   albuterol (VENTOLIN HFA) 108 (90 Base) MCG/ACT inhaler, INHALE 2 PUFFS INTO THE LUNGS EVERY 4 HOURS AS NEEDED FOR WHEEZE OR FOR SHORTNESS OF BREATH, Disp: 18 each, Rfl: 2   ambrisentan (LETAIRIS) 10 MG tablet, Take 1 tablet (10 mg total) by mouth daily., Disp: 30 tablet, Rfl: 11   amoxicillin-clavulanate (AUGMENTIN) 875-125 MG tablet, Take 1 tablet by mouth 2 (two) times daily., Disp: 14 tablet, Rfl: 0   aspirin 81 MG tablet, Take 1 tablet (81 mg total) by mouth daily., Disp: 30 tablet, Rfl: 0   atorvastatin (LIPITOR) 80 MG tablet, TAKE 1 TABLET EVERY DAY, Disp: 90 tablet, Rfl: 3   cetirizine (ZYRTEC) 10 MG tablet, TAKE 1 TABLET EVERY DAY, Disp: 90 tablet, Rfl: 3   esomeprazole (NEXIUM) 40 MG capsule, TAKE 1 CAPSULE EVERY DAY, Disp: 90 capsule, Rfl: 3   ferrous sulfate 325 (65 FE) MG tablet, TAKE 1 TABLET (325 MG TOTAL) BY MOUTH 2 (TWO) TIMES DAILY WITH A MEAL., Disp: 60 tablet, Rfl: 5   fluticasone (FLONASE) 50 MCG/ACT nasal spray, Place 2 sprays into both nostrils daily., Disp: 16 g, Rfl: 2   fluticasone furoate-vilanterol (BREO ELLIPTA) 200-25 MCG/INH AEPB, Inhale 1 puff into the lungs daily., Disp: 90 each, Rfl: 1   guaiFENesin (MUCINEX) 600 MG 12 hr tablet, Take 600 mg by mouth 2 (two) times daily as needed. , Disp: , Rfl:    ipratropium-albuterol (DUONEB) 0.5-2.5 (3) MG/3ML SOLN, TAKE 3 MLS BY NEBULIZATION EVERY 6 (SIX) HOURS AS NEEDED., Disp: 360 mL, Rfl: 10   OXYGEN, 2-3 lpm 24/7  AHC, Disp: , Rfl:    predniSONE (DELTASONE) 5 MG tablet, Take 0.5-1 tablets (2.5-5 mg total) by mouth daily with breakfast., Disp: 30 tablet, Rfl: 11   promethazine-dextromethorphan (PROMETHAZINE-DM) 6.25-15 MG/5ML syrup, Take 5 mLs by mouth 4 (four) times daily as needed for cough., Disp: 118 mL, Rfl: 0   Treprostinil (TYVASO DPI TITRATION KIT) 16 & 32 & 48 MCG POWD, Inhale 16 mcg into the lungs in the  morning, at noon, in the evening, and at bedtime., Disp: 112 each, Rfl: 11  Observations/Objective: Patient is well-developed, well-nourished in no acute distress.  Resting comfortably  at home.  Head is normocephalic, atraumatic.  No labored breathing.  Speech is clear and coherent with logical content.  Patient is alert and oriented at baseline.  Cough congestion, and nasal tone noted   Assessment and Plan: 1. Acute bronchitis with COPD ([place]) - fluticasone (FLONASE) 50 MCG/ACT nasal spray; Place 2 sprays into both nostrils daily.  Dispense: 16 g; Refill: 0 - doxycycline (VIBRA-TABS) 100 MG tablet; Take 1 tablet (100 mg total) by mouth 2 (two) times daily for 10 days.  Dispense: 20 tablet; Refill: 0 - promethazine-dextromethorphan (PROMETHAZINE-DM) 6.25-15 MG/5ML syrup; Take 5 mLs by mouth 3 (three) times daily as needed for cough.  Dispense: 118 mL; Refill: 0  S&S are consistent with increased mucus production related to a bronchitis vs sinus infection. Given recent sinus infection and history- will treat with Doxy and above Encouraged to use rescue inhaler  as she has not been doing that, could be part of issue. Advised 5 mg of prednisone instead of her usual 2.5mg, and to call the Pulm office to discuss need for increased Pred over weekend as well as to be seen with recurrent infections. No increased demand at this time for oxygen and no red flags that seem to need in person ED at this time.  Strict in person precautions reviewed   Reviewed side effects, risks and benefits of medication.    Patient acknowledged agreement and understanding of the plan.   Past Medical, Surgical, Social History, Allergies, and Medications have been Reviewed.   Follow Up Instructions: I discussed the assessment and treatment plan with the patient. The patient was provided an opportunity to ask questions and all were answered. The patient agreed with the plan and demonstrated an understanding of the  instructions.  A copy of instructions were sent to the patient via MyChart unless otherwise noted below.     The patient was advised to call back or seek an in-person evaluation if the symptoms worsen or if the condition fails to improve as anticipated.  Time:  I spent 15 minutes with the patient via telehealth technology discussing the above problems/concerns.    Erin M Mills, NP  

## 2021-11-01 ENCOUNTER — Ambulatory Visit: Payer: 59 | Admitting: Adult Health

## 2021-11-02 ENCOUNTER — Other Ambulatory Visit: Payer: Self-pay | Admitting: Pulmonary Disease

## 2021-11-02 DIAGNOSIS — J441 Chronic obstructive pulmonary disease with (acute) exacerbation: Secondary | ICD-10-CM

## 2021-11-02 DIAGNOSIS — D860 Sarcoidosis of lung: Secondary | ICD-10-CM

## 2021-11-08 ENCOUNTER — Ambulatory Visit: Payer: 59 | Admitting: Adult Health

## 2021-11-10 ENCOUNTER — Telehealth: Payer: Self-pay | Admitting: Emergency Medicine

## 2021-11-10 DIAGNOSIS — J9611 Chronic respiratory failure with hypoxia: Secondary | ICD-10-CM

## 2021-11-14 NOTE — Telephone Encounter (Signed)
New oxygen order sent to [facility]. Since insurance has changed and will no longer cover [employer]. Nothing further needed

## 2021-11-21 ENCOUNTER — Other Ambulatory Visit: Payer: Self-pay | Admitting: Emergency Medicine

## 2021-11-29 ENCOUNTER — Encounter: Payer: Self-pay | Admitting: Physician Assistant

## 2021-11-29 ENCOUNTER — Telehealth: Payer: 59 | Admitting: Physician Assistant

## 2021-11-29 DIAGNOSIS — J208 Acute bronchitis due to other specified organisms: Secondary | ICD-10-CM | POA: Diagnosis not present

## 2021-11-29 DIAGNOSIS — B9689 Other specified bacterial agents as the cause of diseases classified elsewhere: Secondary | ICD-10-CM

## 2021-11-29 DIAGNOSIS — D860 Sarcoidosis of lung: Secondary | ICD-10-CM | POA: Diagnosis not present

## 2021-11-29 MED ORDER — AZITHROMYCIN 250 MG PO TABS: ORAL_TABLET | ORAL | 0 refills | Status: AC

## 2021-11-29 MED ORDER — PREDNISONE 20 MG PO TABS: 40.0000 mg | ORAL_TABLET | Freq: Every day | ORAL | 0 refills | Status: DC

## 2021-11-29 MED ORDER — PROMETHAZINE-DM 6.25-15 MG/5ML PO SYRP: 5.0000 mL | ORAL_SOLUTION | Freq: Four times a day (QID) | ORAL | 0 refills | Status: DC | PRN

## 2021-11-29 MED ORDER — BENZONATATE 100 MG PO CAPS: 100.0000 mg | ORAL_CAPSULE | Freq: Three times a day (TID) | ORAL | 0 refills | Status: DC | PRN

## 2021-11-29 NOTE — Patient Instructions (Signed)
Erin Zavala, thank you for joining Mar Daring, PA-C for today's virtual visit.  While this provider is not your primary care provider (PCP), if your PCP is located in our provider database this encounter information will be shared with them immediately following your visit.  Consent: (Patient) Erin Zavala provided verbal consent for this virtual visit at the beginning of the encounter.  Current Medications:  Current Outpatient Medications:    azithromycin (ZITHROMAX) 250 MG tablet, Take 2 tablets on day 1, then 1 tablet daily on days 2 through 5, Disp: 6 tablet, Rfl: 0   benzonatate (TESSALON) 100 MG capsule, Take 1 capsule (100 mg total) by mouth 3 (three) times daily as needed., Disp: 30 capsule, Rfl: 0   predniSONE (DELTASONE) 20 MG tablet, Take 2 tablets (40 mg total) by mouth daily with breakfast., Disp: 14 tablet, Rfl: 0   promethazine-dextromethorphan (PROMETHAZINE-DM) 6.25-15 MG/5ML syrup, Take 5 mLs by mouth 4 (four) times daily as needed., Disp: 118 mL, Rfl: 0   ADCIRCA 20 MG tablet, Take 2 tablets (40 mg total) by mouth daily., Disp: 60 tablet, Rfl: 11   albuterol (VENTOLIN HFA) 108 (90 Base) MCG/ACT inhaler, INHALE 2 PUFFS INTO THE LUNGS EVERY 4 HOURS AS NEEDED FOR WHEEZE OR FOR SHORTNESS OF BREATH, Disp: 18 each, Rfl: 2   ambrisentan (LETAIRIS) 10 MG tablet, Take 1 tablet (10 mg total) by mouth daily., Disp: 30 tablet, Rfl: 11   aspirin 81 MG tablet, Take 1 tablet (81 mg total) by mouth daily., Disp: 30 tablet, Rfl: 0   atorvastatin (LIPITOR) 80 MG tablet, TAKE 1 TABLET EVERY DAY, Disp: 90 tablet, Rfl: 3   cetirizine (ZYRTEC) 10 MG tablet, TAKE 1 TABLET EVERY DAY, Disp: 90 tablet, Rfl: 3   esomeprazole (NEXIUM) 40 MG capsule, TAKE 1 CAPSULE EVERY DAY, Disp: 90 capsule, Rfl: 3   ferrous sulfate 325 (65 FE) MG tablet, TAKE 1 TABLET (325 MG TOTAL) BY MOUTH 2 (TWO) TIMES DAILY WITH A MEAL., Disp: 60 tablet, Rfl: 5   fluticasone (FLONASE) 50 MCG/ACT nasal spray, Place 2  sprays into both nostrils daily., Disp: 16 g, Rfl: 0   fluticasone furoate-vilanterol (BREO ELLIPTA) 200-25 MCG/ACT AEPB, INHALE 1 PUFF INTO THE LUNGS DAILY., Disp: 180 each, Rfl: 0   guaiFENesin (MUCINEX) 600 MG 12 hr tablet, Take 600 mg by mouth 2 (two) times daily as needed. , Disp: , Rfl:    ipratropium-albuterol (DUONEB) 0.5-2.5 (3) MG/3ML SOLN, TAKE 3 MLS BY NEBULIZATION EVERY 6 (SIX) HOURS AS NEEDED., Disp: 360 mL, Rfl: 10   OXYGEN, 2-3 lpm 24/7  AHC, Disp: , Rfl:    predniSONE (DELTASONE) 5 MG tablet, Take 0.5-1 tablets (2.5-5 mg total) by mouth daily with breakfast., Disp: 30 tablet, Rfl: 11   Treprostinil (TYVASO DPI TITRATION KIT) 16 & 32 & 48 MCG POWD, Inhale 16 mcg into the lungs in the morning, at noon, in the evening, and at bedtime., Disp: 112 each, Rfl: 11   Medications ordered in this encounter:  Meds ordered this encounter  Medications   predniSONE (DELTASONE) 20 MG tablet    Sig: Take 2 tablets (40 mg total) by mouth daily with breakfast.    Dispense:  14 tablet    Refill:  0    Order Specific Question:   Supervising Provider    Answer:   Chase Picket [[id number]]   azithromycin (ZITHROMAX) 250 MG tablet    Sig: Take 2 tablets on day 1, then 1 tablet daily on days 2  through 5    Dispense:  6 tablet    Refill:  0    Order Specific Question:   Supervising Provider    Answer:   Chase Picket [id number]   promethazine-dextromethorphan (PROMETHAZINE-DM) 6.25-15 MG/5ML syrup    Sig: Take 5 mLs by mouth 4 (four) times daily as needed.    Dispense:  118 mL    Refill:  0    Order Specific Question:   Supervising Provider    Answer:   Chase Picket [[id number]]   benzonatate (TESSALON) 100 MG capsule    Sig: Take 1 capsule (100 mg total) by mouth 3 (three) times daily as needed.    Dispense:  30 capsule    Refill:  0    Order Specific Question:   Supervising Provider    Answer:   Chase Picket [id number]     *If you need refills on other medications prior to  your next appointment, please contact your pharmacy*  Follow-Up: Call back or seek an in-person evaluation if the symptoms worsen or if the condition fails to improve as anticipated.  Other Instructions Acute Bronchitis, Adult  Acute bronchitis is sudden inflammation of the main airways (bronchi) that come off the windpipe (trachea) in the lungs. The swelling causes the airways to get smaller and make more mucus than normal. This can make it hard to breathe and can cause coughing or noisy breathing (wheezing). Acute bronchitis may last several weeks. The cough may last longer. Allergies, asthma, and exposure to smoke may make the condition worse. What are the causes? This condition can be caused by germs and by substances that irritate the lungs, including: Cold and flu viruses. The most common cause of this condition is the virus that causes the common cold. Bacteria. This is less common. Breathing in substances that irritate the lungs, including: Smoke from cigarettes and other forms of tobacco. Dust and pollen. Fumes from household cleaning products, gases, or burned fuel. Indoor or outdoor air pollution. What increases the risk? The following factors may make you more likely to develop this condition: A weak body's defense system, also called the immune system. A condition that affects your lungs and breathing, such as asthma. What are the signs or symptoms? Common symptoms of this condition include: Coughing. This may bring up clear, yellow, or green mucus from your lungs (sputum). Wheezing. Runny or stuffy nose. Having too much mucus in your lungs (chest congestion). Shortness of breath. Aches and pains, including sore throat or chest. How is this diagnosed? This condition is usually diagnosed based on: Your symptoms and medical history. A physical exam. You may also have other tests, including tests to rule out other conditions, such as pneumonia. These tests include: A  test of lung function. Test of a mucus sample to look for the presence of bacteria. Tests to check the oxygen level in your blood. Blood tests. Chest X-ray. How is this treated? Most cases of acute bronchitis clear up over time without treatment. Your health care provider may recommend: Drinking more fluids to help thin your mucus so it is easier to cough up. Taking inhaled medicine (inhaler) to improve air flow in and out of your lungs. Using a vaporizer or a humidifier. These are machines that add water to the air to help you breathe better. Taking a medicine that thins mucus and clears congestion (expectorant). Taking a medicine that prevents or stops coughing (cough suppressant). It is not common to take an antibiotic medicine  for this condition. Follow these instructions at home:  Take over-the-counter and prescription medicines only as told by your health care provider. Use an inhaler, vaporizer, or humidifier as told by your health care provider. Take two teaspoons (10 mL) of honey at bedtime to lessen coughing at night. Drink enough fluid to keep your urine pale yellow. Do not use any products that contain nicotine or tobacco. These products include cigarettes, chewing tobacco, and vaping devices, such as e-cigarettes. If you need help quitting, ask your health care provider. Get plenty of rest. Return to your normal activities as told by your health care provider. Ask your health care provider what activities are safe for you. Keep all follow-up visits. This is important. How is this prevented? To lower your risk of getting this condition again: Wash your hands often with soap and water for at least 20 seconds. If soap and water are not available, use hand sanitizer. Avoid contact with people who have cold symptoms. Try not to touch your mouth, nose, or eyes with your hands. Avoid breathing in smoke or chemical fumes. Breathing smoke or chemical fumes will make your condition  worse. Get the flu shot every year. Contact a health care provider if: Your symptoms do not improve after 2 weeks. You have trouble coughing up the mucus. Your cough keeps you awake at night. You have a fever. Get help right away if you: Cough up blood. Feel pain in your chest. Have severe shortness of breath. Faint or keep feeling like you are going to faint. Have a severe headache. Have a fever or chills that get worse. These symptoms may represent a serious problem that is an emergency. Do not wait to see if the symptoms will go away. Get medical help right away. Call your local emergency services (911 in the [place] Do not drive yourself to the hospital. Summary Acute bronchitis is inflammation of the main airways (bronchi) that come off the windpipe (trachea) in the lungs. The swelling causes the airways to get smaller and make more mucus than normal. Drinking more fluids can help thin your mucus so it is easier to cough up. Take over-the-counter and prescription medicines only as told by your health care provider. Do not use any products that contain nicotine or tobacco. These products include cigarettes, chewing tobacco, and vaping devices, such as e-cigarettes. If you need help quitting, ask your health care provider. Contact a health care provider if your symptoms do not improve after 2 weeks. This information is not intended to replace advice given to you by your health care provider. Make sure you discuss any questions you have with your health care provider. Document Revised: 06/08/2021 Document Reviewed: 06/29/2020 Elsevier Patient Education  [place].    If you have been instructed to have an in-person evaluation today at a local Urgent Care facility, please use the link below. It will take you to a list of all of our available [facility], including address, phone number and hours of operation. Please do not delay care.  [facility]  If  you or a family member do not have a primary care provider, use the link below to schedule a visit and establish care. When you choose a [facility] primary care physician or advanced practice provider, you gain a long-term partner in health. Find a Primary Care Provider  Learn more about [facility]'s in-office and virtual care options: [facility]

## 2021-11-29 NOTE — Progress Notes (Signed)
Virtual Visit Consent   Erin Zavala, you are scheduled for a virtual visit with a [facility] provider today. Just as with appointments in the office, your consent must be obtained to participate. Your consent will be active for this visit and any virtual visit you may have with one of our providers in the next 365 days. If you have a MyChart account, a copy of this consent can be sent to you electronically.  As this is a virtual visit, video technology does not allow for your provider to perform a traditional examination. This may limit your provider's ability to fully assess your condition. If your provider identifies any concerns that need to be evaluated in person or the need to arrange testing (such as labs, EKG, etc.), we will make arrangements to do so. Although advances in technology are sophisticated, we cannot ensure that it will always work on either your end or our end. If the connection with a video visit is poor, the visit may have to be switched to a telephone visit. With either a video or telephone visit, we are not always able to ensure that we have a secure connection.  By engaging in this virtual visit, you consent to the provision of healthcare and authorize for your insurance to be billed (if applicable) for the services provided during this visit. Depending on your insurance coverage, you may receive a charge related to this service.  I need to obtain your verbal consent now. Are you willing to proceed with your visit today? Erin Zavala has provided verbal consent on 11/29/2021 for a virtual visit (video or telephone). Mar Daring, PA-C  Date: 11/29/2021 8:42 AM  Virtual Visit via Video Note   I, Mar Daring, connected with  Erin Zavala  ([id number], 04-10-1965) on 11/29/21 at  8:30 AM EDT by a video-enabled telemedicine application and verified that I am speaking with the correct person using two identifiers.  Location: Patient: Virtual Visit Location  Patient: Home Provider: Virtual Visit Location Provider: Home Office   I discussed the limitations of evaluation and management by telemedicine and the availability of in person appointments. The patient expressed understanding and agreed to proceed.    History of Present Illness: Erin Zavala is a 56 y.o. who identifies as a female who was assigned female at birth, and is being seen today for possible sinus infection and bronchitis.  HPI: Cough This is a new problem. The current episode started in the past 7 days (Thursday last week, then improved, but then worsend over the last few days again). The problem has been gradually worsening. The problem occurs constantly. The cough is Productive of sputum and productive of purulent sputum. Associated symptoms include nasal congestion, postnasal drip, shortness of breath and wheezing. Pertinent negatives include no chills or fever.    Covid at home testing is negative x 2  Home O2 is 95% on 3L oxygen via [place], normally uses 2L  Problems:  Patient Active Problem List   Diagnosis Date Noted   Allergic rhinitis 06/16/2020   COPD (chronic obstructive pulmonary disease) ([place]) 09/18/2018   Cough 04/04/2018   Thrush 01/02/2018   Chronic respiratory failure with hypoxia ([place]) 08/19/2017   Coronary artery disease 07/23/2016   Health care maintenance 06/11/2016   Sinus congestion 03/23/2016   Hypoxemia 03/23/2016   Pulmonary hypertension ([place]) 12/28/2015   Acute bronchitis 12/28/2015   Childhood asthma    Pulmonary sarcoidosis ([place]) 11/02/2015   GERD (gastroesophageal reflux disease) 11/02/2015  Allergies: No Known Allergies Medications:  Current Outpatient Medications:    azithromycin (ZITHROMAX) 250 MG tablet, Take 2 tablets on day 1, then 1 tablet daily on days 2 through 5, Disp: 6 tablet, Rfl: 0   benzonatate (TESSALON) 100 MG capsule, Take 1 capsule (100 mg total) by mouth 3 (three) times daily as needed., Disp: 30 capsule, Rfl: 0    predniSONE (DELTASONE) 20 MG tablet, Take 2 tablets (40 mg total) by mouth daily with breakfast., Disp: 14 tablet, Rfl: 0   promethazine-dextromethorphan (PROMETHAZINE-DM) 6.25-15 MG/5ML syrup, Take 5 mLs by mouth 4 (four) times daily as needed., Disp: 118 mL, Rfl: 0   ADCIRCA 20 MG tablet, Take 2 tablets (40 mg total) by mouth daily., Disp: 60 tablet, Rfl: 11   albuterol (VENTOLIN HFA) 108 (90 Base) MCG/ACT inhaler, INHALE 2 PUFFS INTO THE LUNGS EVERY 4 HOURS AS NEEDED FOR WHEEZE OR FOR SHORTNESS OF BREATH, Disp: 18 each, Rfl: 2   ambrisentan (LETAIRIS) 10 MG tablet, Take 1 tablet (10 mg total) by mouth daily., Disp: 30 tablet, Rfl: 11   aspirin 81 MG tablet, Take 1 tablet (81 mg total) by mouth daily., Disp: 30 tablet, Rfl: 0   atorvastatin (LIPITOR) 80 MG tablet, TAKE 1 TABLET EVERY DAY, Disp: 90 tablet, Rfl: 3   cetirizine (ZYRTEC) 10 MG tablet, TAKE 1 TABLET EVERY DAY, Disp: 90 tablet, Rfl: 3   esomeprazole (NEXIUM) 40 MG capsule, TAKE 1 CAPSULE EVERY DAY, Disp: 90 capsule, Rfl: 3   ferrous sulfate 325 (65 FE) MG tablet, TAKE 1 TABLET (325 MG TOTAL) BY MOUTH 2 (TWO) TIMES DAILY WITH A MEAL., Disp: 60 tablet, Rfl: 5   fluticasone (FLONASE) 50 MCG/ACT nasal spray, Place 2 sprays into both nostrils daily., Disp: 16 g, Rfl: 0   fluticasone furoate-vilanterol (BREO ELLIPTA) 200-25 MCG/ACT AEPB, INHALE 1 PUFF INTO THE LUNGS DAILY., Disp: 180 each, Rfl: 0   guaiFENesin (MUCINEX) 600 MG 12 hr tablet, Take 600 mg by mouth 2 (two) times daily as needed. , Disp: , Rfl:    ipratropium-albuterol (DUONEB) 0.5-2.5 (3) MG/3ML SOLN, TAKE 3 MLS BY NEBULIZATION EVERY 6 (SIX) HOURS AS NEEDED., Disp: 360 mL, Rfl: 10   OXYGEN, 2-3 lpm 24/7  AHC, Disp: , Rfl:    predniSONE (DELTASONE) 5 MG tablet, Take 0.5-1 tablets (2.5-5 mg total) by mouth daily with breakfast., Disp: 30 tablet, Rfl: 11   Treprostinil (TYVASO DPI TITRATION KIT) 16 & 32 & 48 MCG POWD, Inhale 16 mcg into the lungs in the morning, at noon, in the  evening, and at bedtime., Disp: 112 each, Rfl: 11  Observations/Objective: Patient is well-developed, well-nourished in no acute distress.  Resting comfortably at home.  Head is normocephalic, atraumatic.  No labored breathing.  Speech is clear and coherent with logical content. In the beginning was short of breath and unable to complete sentences, but had just had a coughing spell prior to visit. As visit progressed speech improved to full, complete sentences Patient is alert and oriented at baseline.    Assessment and Plan: 1. Acute bacterial bronchitis - predniSONE (DELTASONE) 20 MG tablet; Take 2 tablets (40 mg total) by mouth daily with breakfast.  Dispense: 14 tablet; Refill: 0 - azithromycin (ZITHROMAX) 250 MG tablet; Take 2 tablets on day 1, then 1 tablet daily on days 2 through 5  Dispense: 6 tablet; Refill: 0 - promethazine-dextromethorphan (PROMETHAZINE-DM) 6.25-15 MG/5ML syrup; Take 5 mLs by mouth 4 (four) times daily as needed.  Dispense: 118 mL; Refill: 0 -  benzonatate (TESSALON) 100 MG capsule; Take 1 capsule (100 mg total) by mouth 3 (three) times daily as needed.  Dispense: 30 capsule; Refill: 0  2. Pulmonary sarcoidosis (HCC) - predniSONE (DELTASONE) 20 MG tablet; Take 2 tablets (40 mg total) by mouth daily with breakfast.  Dispense: 14 tablet; Refill: 0  - Worsening over a week despite OTC medications - Will treat with Z-pack, prednisone, Promethazine DM, and tessalon perles - Can continue Mucinex  - Push fluids.  - Rest.  - Steam and humidifier can help - Use Nebulizer and inhalers as prescribed - Strict precautions of in person emergent evaluation verbally discussed - Seek in person evaluation if worsening or symptoms fail to improve    Follow Up Instructions: I discussed the assessment and treatment plan with the patient. The patient was provided an opportunity to ask questions and all were answered. The patient agreed with the plan and demonstrated an  understanding of the instructions.  A copy of instructions were sent to the patient via MyChart unless otherwise noted below.    The patient was advised to call back or seek an in-person evaluation if the symptoms worsen or if the condition fails to improve as anticipated.  Time:  I spent 12 minutes with the patient via telehealth technology discussing the above problems/concerns.    Mar Daring, PA-C

## 2021-12-10 ENCOUNTER — Other Ambulatory Visit: Payer: Self-pay | Admitting: Emergency Medicine

## 2021-12-20 ENCOUNTER — Ambulatory Visit (INDEPENDENT_AMBULATORY_CARE_PROVIDER_SITE_OTHER): Payer: 59 | Admitting: Emergency Medicine

## 2021-12-20 ENCOUNTER — Encounter: Payer: Self-pay | Admitting: Emergency Medicine

## 2021-12-20 VITALS — BP 120/70 | HR 104 | Temp 97.7°F | Ht 60.0 in | Wt 163.4 lb

## 2021-12-20 DIAGNOSIS — D860 Sarcoidosis of lung: Secondary | ICD-10-CM

## 2021-12-20 DIAGNOSIS — Z23 Encounter for immunization: Secondary | ICD-10-CM

## 2021-12-20 DIAGNOSIS — J9611 Chronic respiratory failure with hypoxia: Secondary | ICD-10-CM | POA: Diagnosis not present

## 2021-12-20 DIAGNOSIS — I272 Pulmonary hypertension, unspecified: Secondary | ICD-10-CM | POA: Diagnosis not present

## 2021-12-20 MED ORDER — PREDNISONE 1 MG PO TABS: 2.5000 mg | ORAL_TABLET | Freq: Every day | ORAL | 0 refills | Status: DC

## 2021-12-20 NOTE — Assessment & Plan Note (Signed)
Continue your Adcirca, Opsumit, Tyvaso as directed by Dr. [name] Probably need a repeat TTE, per Dr Claris Gladden plans

## 2021-12-20 NOTE — Addendum Note (Signed)
Addended by: Gavin Potters R on: 12/20/2021 03:32 PM   Modules accepted: Orders

## 2021-12-20 NOTE — Assessment & Plan Note (Signed)
We will try decreasing your prednisone to 2.5 mg once daily.  Please call our office if you have any problems after making the change Continue your Breo 1 inhalation once daily.  Rinse and gargle after using. Keep albuterol available to use 2 puffs if needed for shortness of breath We will perform pulmonary function testing We will perform a repeat high-resolution CT scan of the chest Follow with Dr [name] next available after your CT scan and pulmonary function testing have been done

## 2021-12-20 NOTE — Progress Notes (Signed)
Subjective:    Patient ID: Erin Zavala, female    DOB: 01-Nov-1965, 56 y.o.   MRN: [id number]     Shortness of Breath Pertinent negatives include no abdominal pain, chest pain, leg swelling, rhinorrhea or wheezing.   ROV 05/30/21 --56 year old woman with a history of sarcoidosis and associated interstitial lung disease on chronic prednisone 5 mg daily.  She also has ocular involvement, associated secondary pulmonary hypertension this been managed on targeted therapy [name], Luvenia Heller), chronic hypoxemic respiratory failure on 3-4L/min. She has been out of Adcirca for a week, about to restart it.  Currently managed on Breo, fluticasone nasal spray, Zyrtec. Uses albuterol about 2x a week.  She was treated with a prednisone taper in November 2022. Again 2 weeks ago when she began to have increase nasal congestion, drainage. Still having rhinitis sx, but her flaring sx are better. Back to 68m pred now.   ROV 12/20/21 --Erin Lookfollows up today for her history of sarcoidosis and associated interstitial lung disease.  She is 89and has documented sarcoid, associated with interstitial disease, ocular involvement, secondary pulmonary hypertension on Adcirca, opsumit and Tyvaso.  She has chronic respiratory failure 3-4 L/min.  Currently managed on prednisone 5 / 2.5 mg every other day.  Repeat CT chest and PFT were planned for May 2023 but this has not been done.  She has chronic nasal congestion and sinus disease.  She [place] me [facility] since I last saw her for insurance reasons. While this was happening she was treated with an increase of pred, now back down. Remains on Breo, uses albuterol rarely.     Review of Systems  Constitutional:  Positive for unexpected weight change (WIth increased steroid dose). Negative for fatigue.  HENT:  Negative for rhinorrhea and sinus pain.   Eyes:  Negative for discharge and redness.  Respiratory:  Positive for shortness of  breath. Negative for cough, chest tightness and wheezing.   Cardiovascular:  Negative for chest pain, palpitations and leg swelling.  Gastrointestinal:  Negative for abdominal distention and abdominal pain.  Musculoskeletal:  Negative for myalgias.  Neurological:  Negative for light-headedness.       Objective:    Vitals:   12/20/21 1441  BP: 120/70  Pulse: (!) 104  Temp: 97.7 F (36.5 C)  TempSrc: Oral  SpO2: 94%  Weight: 163 lb 6.4 oz (74.1 kg)  Height: 5' (1.524 m)   Gen: Pleasant, well-nourished, in no distress,  normal affect  ENT: No lesions,  mouth clear,  oropharynx clear, no postnasal drip  Neck: No JVD, no stridor  Lungs: No use of accessory muscles, focal right inspiratory squeaks, no wheezes or crackles  Cardiovascular: RRR, heart sounds normal, no murmur or gallops, no peripheral edema  Musculoskeletal: No deformities, no cyanosis or clubbing  Neuro: alert, awake, non focal  Skin: Warm, no lesions or rash     Assessment & Plan:  Pulmonary sarcoidosis (HCC) We will try decreasing your prednisone to 2.5 mg once daily.  Please call our office if you have any problems after making the change Continue your Breo 1 inhalation once daily.  Rinse and gargle after using. Keep albuterol available to use 2 puffs if needed for shortness of breath We will perform pulmonary function testing We will perform a repeat high-resolution CT scan of the chest Follow with Dr BLamonte Sakainext available after your CT scan and pulmonary function testing have been done  Pulmonary hypertension ([facility] Continue your Adcirca,  Opsumit, Tyvaso as directed by Dr. [name] Probably need a repeat TTE, per Dr Claris Gladden plans  Chronic respiratory failure with hypoxia [facility]) We will perform walking oximetry today and titrate your oxygen for you to use with exertion    Baltazar Apo, MD, PhD 12/20/2021, 2:54 PM [facility] [phone] or if no answer before 7:00PM call  [phone] For any issues after 7:00PM please call eLink [phone]

## 2021-12-20 NOTE — Patient Instructions (Addendum)
We will try decreasing your prednisone to 2.5 mg once daily.  Please call our office if you have any problems after making the change Continue your Breo 1 inhalation once daily.  Rinse and gargle after using. Keep albuterol available to use 2 puffs if needed for shortness of breath We will perform pulmonary function testing We will perform a repeat high-resolution CT scan of the chest Continue your Adcirca, Opsumit, Tyvaso as directed by Dr. [name] We will perform walking oximetry today and titrate your oxygen for you to use with exertion Follow with Dr [name] next available after your CT scan and pulmonary function testing have been done

## 2021-12-20 NOTE — Assessment & Plan Note (Signed)
We will perform walking oximetry today and titrate your oxygen for you to use with exertion

## 2021-12-26 ENCOUNTER — Ambulatory Visit (HOSPITAL_COMMUNITY)
Admission: RE | Admit: 2021-12-26 | Discharge: 2021-12-26 | Disposition: A | Discharge status: Home / Self Care | Payer: 59 | Source: Ambulatory Visit | Attending: Emergency Medicine | Admitting: Emergency Medicine

## 2021-12-26 DIAGNOSIS — D860 Sarcoidosis of lung: Secondary | ICD-10-CM | POA: Insufficient documentation

## 2022-01-04 ENCOUNTER — Other Ambulatory Visit: Payer: Self-pay | Admitting: Emergency Medicine

## 2022-01-04 ENCOUNTER — Other Ambulatory Visit (HOSPITAL_COMMUNITY): Payer: Self-pay | Admitting: Cardiology

## 2022-01-10 ENCOUNTER — Other Ambulatory Visit: Payer: Self-pay | Admitting: Emergency Medicine

## 2022-01-30 ENCOUNTER — Other Ambulatory Visit: Payer: Self-pay | Admitting: Emergency Medicine

## 2022-02-02 ENCOUNTER — Other Ambulatory Visit: Payer: Self-pay | Admitting: Emergency Medicine

## 2022-02-07 ENCOUNTER — Encounter: Payer: Self-pay | Admitting: Emergency Medicine

## 2022-02-07 ENCOUNTER — Ambulatory Visit (INDEPENDENT_AMBULATORY_CARE_PROVIDER_SITE_OTHER): Payer: 59 | Admitting: Emergency Medicine

## 2022-02-07 VITALS — BP 128/76 | HR 85 | Temp 97.8°F | Ht 61.0 in | Wt 162.0 lb

## 2022-02-07 DIAGNOSIS — I272 Pulmonary hypertension, unspecified: Secondary | ICD-10-CM

## 2022-02-07 DIAGNOSIS — J449 Chronic obstructive pulmonary disease, unspecified: Secondary | ICD-10-CM

## 2022-02-07 DIAGNOSIS — D860 Sarcoidosis of lung: Secondary | ICD-10-CM | POA: Diagnosis not present

## 2022-02-07 LAB — PULMONARY FUNCTION TEST
DL/VA % pred: 47 %
DL/VA: 2.09 ml/min/mmHg/L
DLCO cor % pred: 29 %
DLCO cor: 5.32 ml/min/mmHg
DLCO unc % pred: 29 %
DLCO unc: 5.32 ml/min/mmHg
FEF 25-75 Post: 1.43 L/sec
FEF 25-75 Pre: 1.48 L/sec
FEF2575-%Change-Post: -3 %
FEF2575-%Pred-Post: 61 %
FEF2575-%Pred-Pre: 63 %
FEV1-%Change-Post: 0 %
FEV1-%Pred-Post: 61 %
FEV1-%Pred-Pre: 62 %
FEV1-Post: 1.43 L
FEV1-Pre: 1.44 L
FEV1FVC-%Change-Post: -1 %
FEV1FVC-%Pred-Pre: 103 %
FEV6-%Change-Post: 1 %
FEV6-%Pred-Post: 61 %
FEV6-%Pred-Pre: 60 %
FEV6-Post: 1.76 L
FEV6-Pre: 1.74 L
FEV6FVC-%Pred-Post: 103 %
FEV6FVC-%Pred-Pre: 103 %
FVC-%Change-Post: 0 %
FVC-%Pred-Post: 59 %
FVC-%Pred-Pre: 59 %
FVC-Post: 1.76 L
FVC-Pre: 1.75 L
Post FEV1/FVC ratio: 81 %
Post FEV6/FVC ratio: 100 %
Pre FEV1/FVC ratio: 82 %
Pre FEV6/FVC Ratio: 100 %
RV % pred: 48 %
RV: 0.82 L
TLC % pred: 56 %
TLC: 2.51 L

## 2022-02-07 NOTE — Assessment & Plan Note (Signed)
Due to sarcoidosis.  Followed closely by Dr. [name].  Plan to continue her same [facility] medication regimen.

## 2022-02-07 NOTE — Patient Instructions (Signed)
Try changing your prednisone to 1 mg every other day.  Keep track of whether you develop any new symptoms with this change including fatigue, weakness, more shortness of breath, nausea.  If so please go back to taking 1 mg every day and call us. Continue Breo 1 elation once daily.  Rinse and gargle after using. Keep albuterol available to use 2 puffs if needed for shortness of breath Continue your PAH medications: Adcirca, Opsumit, Tyvaso and follow with Dr. [name] as planned. Continue your oxygen as you have been using it Follow Dr. [name] in 1 month or next available so we can decide about any dosing changes on your prednisone.

## 2022-02-07 NOTE — Progress Notes (Signed)
Subjective:    Patient ID: Erin Zavala, female    DOB: [birth year], 56 y.o.   MRN: [id number]     Shortness of Breath Pertinent negatives include no abdominal pain, chest pain, leg swelling, rhinorrhea or wheezing.    ROV 02/07/22 --56 year old woman with a history of sarcoidosis and associated interstitial lung disease, chronic hypoxemic respiratory failure and secondary PAH.  Currently managed on prednisone and she has been weaning this - is currently using 5m daily.  She has been on Breo, rarely uses albuterol.  She is back on her PAH medications: Adcirca, Opsumit, Tyvaso. She is having stable exertional SOB, has to increase her O2 when she does housework. Rare albuterol use. No cough or wheeze currently.   High-resolution CT scan of the chest done on 12/26/2021 reviewed by me, shows extensive areas of groundglass and associated septal thickening with some bronchiectasis, most severe in the mid to upper lungs with associated chronic volume loss.  There is some subpleural nodularity.  Findings are not significantly changed compared with 06/03/2018  Pulmonary function testing performed today and reviewed by me, shows mixed obstruction and restriction with FEV1 1.44 L (62% predicted).  Lung volumes confirm restriction.  Her diffusion capacity is decreased.  FEV1 overall stable going back to 08/23/2016.    Review of Systems  Constitutional:  Positive for unexpected weight change (WIth increased steroid dose). Negative for fatigue.  HENT:  Negative for rhinorrhea and sinus pain.   Eyes:  Negative for discharge and redness.  Respiratory:  Positive for shortness of breath. Negative for cough, chest tightness and wheezing.   Cardiovascular:  Negative for chest pain, palpitations and leg swelling.  Gastrointestinal:  Negative for abdominal distention and abdominal pain.  Musculoskeletal:  Negative for myalgias.  Neurological:  Negative for light-headedness.       Objective:    Vitals:    02/07/22 1132  BP: 128/76  Pulse: 85  Temp: 97.8 F (36.6 C)  TempSrc: Oral  SpO2: 100%  Weight: 162 lb (73.5 kg)  Height: _0  (1.549 m)   Gen: Pleasant, well-nourished, in no distress,  normal affect  ENT: No lesions,  mouth clear,  oropharynx clear, no postnasal drip  Neck: No JVD, no stridor  Lungs: No use of accessory muscles, focal right inspiratory squeaks, no wheezes or crackles  Cardiovascular: RRR, heart sounds normal, no murmur or gallops, no peripheral edema  Musculoskeletal: No deformities, no cyanosis or clubbing  Neuro: alert, awake, non focal  Skin: Warm, no lesions or rash     Assessment & Plan:  Pulmonary sarcoidosis (HCC) Her PFT are both stable.  Show evidence of stage III sarcoidosis changes.  She has been coming down on her prednisone, is actually on 1 mg daily (lower than I thought).  At this point it might be reasonable for [place] try to weaned off and see how she does.  She is open to this plan.  We will try changing to every other day.  Reviewed the symptoms of adrenal insufficiency with her.  She will call [place] she has trouble.  If she tolerates then we will consider stopping prednisone altogether at her next visit.  Follow-up in 1 month or next available.  COPD (chronic obstructive pulmonary disease) (HCC) Stable on Breo.  Plan continue same.  Pulmonary hypertension (HCC) Due to sarcoidosis.  Followed closely by Dr. MAundra Dubin  [facility].    RBaltazar Apo MD, PhD 02/07/2022, 12:03 PM [facility] [phone] or if no answer before 7:00PM call [phone] For any issues after 7:00PM please call eLink [phone]

## 2022-02-07 NOTE — Assessment & Plan Note (Signed)
Her PFT are both stable.  Show evidence of stage III sarcoidosis changes.  She has been coming down on her prednisone, is actually on 1 mg daily (lower than I thought).  At this point it might be reasonable for [place] to try to weaned off and see how she does.  She is open to this plan.  We will try changing to every other day.  Reviewed the symptoms of adrenal insufficiency with her.  She will call us if she has trouble.  If she tolerates then we will consider stopping prednisone altogether at her next visit.  Follow-up in 1 month or next available.

## 2022-02-07 NOTE — Assessment & Plan Note (Signed)
Stable on Breo.  Plan continue same.

## 2022-02-07 NOTE — Progress Notes (Signed)
PFT done today. 

## 2022-02-15 ENCOUNTER — Other Ambulatory Visit: Payer: Self-pay | Admitting: Emergency Medicine

## 2022-02-15 NOTE — Telephone Encounter (Signed)
Pt should have available refills

## 2022-02-16 ENCOUNTER — Other Ambulatory Visit (HOSPITAL_COMMUNITY): Payer: Self-pay | Admitting: Cardiology

## 2022-02-18 ENCOUNTER — Other Ambulatory Visit: Payer: Self-pay | Admitting: Emergency Medicine

## 2022-03-01 ENCOUNTER — Telehealth (HOSPITAL_COMMUNITY): Payer: Self-pay | Admitting: Pharmacy Technician

## 2022-03-01 NOTE — Telephone Encounter (Signed)
Advanced Heart Failure Patient Advocate Encounter  Submitted provider portion of Adcirca patient assistance with [employer] via fax.

## 2022-03-14 ENCOUNTER — Ambulatory Visit: Payer: 59 | Admitting: Emergency Medicine

## 2022-03-14 ENCOUNTER — Other Ambulatory Visit (HOSPITAL_COMMUNITY): Payer: Self-pay

## 2022-03-14 DIAGNOSIS — I5022 Chronic systolic (congestive) heart failure: Secondary | ICD-10-CM

## 2022-03-19 ENCOUNTER — Other Ambulatory Visit (HOSPITAL_COMMUNITY): Payer: Self-pay | Admitting: Cardiology

## 2022-03-25 ENCOUNTER — Other Ambulatory Visit: Payer: Self-pay | Admitting: Emergency Medicine

## 2022-03-25 ENCOUNTER — Telehealth: Payer: Medicare Other | Admitting: Family Medicine

## 2022-03-25 DIAGNOSIS — J208 Acute bronchitis due to other specified organisms: Secondary | ICD-10-CM

## 2022-03-25 DIAGNOSIS — B9689 Other specified bacterial agents as the cause of diseases classified elsewhere: Secondary | ICD-10-CM

## 2022-03-25 DIAGNOSIS — J40 Bronchitis, not specified as acute or chronic: Secondary | ICD-10-CM

## 2022-03-25 MED ORDER — AZITHROMYCIN 250 MG PO TABS: ORAL_TABLET | ORAL | 0 refills | Status: DC

## 2022-03-25 MED ORDER — BENZONATATE 100 MG PO CAPS: 100.0000 mg | ORAL_CAPSULE | Freq: Three times a day (TID) | ORAL | 0 refills | Status: DC | PRN

## 2022-03-25 NOTE — Progress Notes (Signed)
We are sorry that you are not feeling well.  Here is how we plan to help!  Based on your presentation I believe you most likely have A cough due to bacteria.  When patients have a fever and a productive cough with a change in color or increased sputum production, we are concerned about bacterial bronchitis.  If left untreated it can progress to pneumonia.  If your symptoms do not improve with your treatment plan it is important that you contact your provider.   I have prescribed Azithromyin 250 mg: two tablets now and then one tablet daily for 4 additonal days    In addition you may use A prescription cough medication called Tessalon Perles 115m. You may take 1-2 capsules every 8 hours as needed for your cough.    From your responses in the eVisit questionnaire you describe inflammation in the upper respiratory tract which is causing a significant cough.  This is commonly called Bronchitis and has four common causes:   Allergies Viral Infections Acid Reflux Bacterial Infection Allergies, viruses and acid reflux are treated by controlling symptoms or eliminating the cause. An example might be a cough caused by taking certain blood pressure medications. You stop the cough by changing the medication. Another example might be a cough caused by acid reflux. Controlling the reflux helps control the cough.  USE OF BRONCHODILATOR ("RESCUE") INHALERS: There is a risk from using your bronchodilator too frequently.  The risk is that over-reliance on a medication which only relaxes the muscles surrounding the breathing tubes can reduce the effectiveness of medications prescribed to reduce swelling and congestion of the tubes themselves.  Although you feel brief relief from the bronchodilator inhaler, your asthma may actually be worsening with the tubes becoming more swollen and filled with mucus.  This can delay other crucial treatments, such as oral steroid medications. If you need to use a bronchodilator  inhaler daily, several times per day, you should discuss this with your provider.  There are probably better treatments that could be used to keep your asthma under control.     HOME CARE Only take medications as instructed by your medical team. Complete the entire course of an antibiotic. Drink plenty of fluids and get plenty of rest. Avoid close contacts especially the very young and the elderly Cover your mouth if you cough or cough into your sleeve. Always remember to wash your hands A steam or ultrasonic humidifier can help congestion.   GET HELP RIGHT AWAY IF: You develop worsening fever. You become short of breath You cough up blood. Your symptoms persist after you have completed your treatment plan MAKE SURE YOU  Understand these instructions. Will watch your condition. Will get help right away if you are not doing well or get worse.    Thank you for choosing an e-visit.  Your e-visit answers were reviewed by a board certified advanced clinical practitioner to complete your personal care plan. Depending upon the condition, your plan could have included both over the counter or prescription medications.  Please review your pharmacy choice. Make sure the pharmacy is open so you can pick up prescription now. If there is a problem, you may contact your provider through [employer]and have the prescription routed to another pharmacy.  Your safety is important to [place] If you have drug allergies check your prescription carefully.   For the next 24 hours you can use MyChart to ask questions about today's visit, request a non-urgent call back, or  ask for a work or school excuse. You will get an email in the next two days asking about your experience. I hope that your e-visit has been valuable and will speed your recovery.   have provided 5 minutes of non face to face time during this encounter for chart review and documentation.

## 2022-03-29 ENCOUNTER — Encounter: Payer: Self-pay | Admitting: Nurse Practitioner

## 2022-03-29 ENCOUNTER — Telehealth (INDEPENDENT_AMBULATORY_CARE_PROVIDER_SITE_OTHER): Payer: Self-pay | Admitting: Nurse Practitioner

## 2022-03-29 VITALS — Ht 61.0 in | Wt 163.2 lb

## 2022-03-29 DIAGNOSIS — D869 Sarcoidosis, unspecified: Secondary | ICD-10-CM | POA: Diagnosis not present

## 2022-03-29 DIAGNOSIS — J9602 Acute respiratory failure with hypercapnia: Secondary | ICD-10-CM | POA: Diagnosis not present

## 2022-03-29 DIAGNOSIS — B9689 Other specified bacterial agents as the cause of diseases classified elsewhere: Secondary | ICD-10-CM

## 2022-03-29 DIAGNOSIS — D860 Sarcoidosis of lung: Secondary | ICD-10-CM

## 2022-03-29 DIAGNOSIS — I272 Pulmonary hypertension, unspecified: Secondary | ICD-10-CM

## 2022-03-29 DIAGNOSIS — J440 Chronic obstructive pulmonary disease with acute lower respiratory infection: Secondary | ICD-10-CM

## 2022-03-29 DIAGNOSIS — J45909 Unspecified asthma, uncomplicated: Secondary | ICD-10-CM | POA: Diagnosis not present

## 2022-03-29 DIAGNOSIS — J209 Acute bronchitis, unspecified: Secondary | ICD-10-CM

## 2022-03-29 DIAGNOSIS — J9611 Chronic respiratory failure with hypoxia: Secondary | ICD-10-CM

## 2022-03-29 MED ORDER — PROMETHAZINE-DM 6.25-15 MG/5ML PO SYRP: 5.0000 mL | ORAL_SOLUTION | Freq: Four times a day (QID) | ORAL | 0 refills | Status: DC | PRN

## 2022-03-29 MED ORDER — PREDNISONE 10 MG PO TABS: ORAL_TABLET | ORAL | 0 refills | Status: DC

## 2022-03-29 NOTE — Patient Instructions (Signed)
Continue Albuterol inhaler 2 puffs or duoneb 3 mL neb every 6 hours as needed for shortness of breath or wheezing. Notify if symptoms persist despite rescue inhaler/neb use.  Continue Breo 1 puff daily. Brush tongue and rinse mouth afterwards Continue cetirizine 1 tab daily Continue nexium 1 capsule daily Continue fluticasone 2 sprays each nostril daily Continue guaifenesin 500 mg Twice daily for congestion/cough Continue supplemental oxygen 2-3 lpm for goal >88-90% Continue Tyvaso as you have been Continue adcirca 2 tabs daily Continue opsumit 1 tab daily  Complete azithromycin Prednisone taper. 4 tabs for 2 days, then 3 tabs for 2 days, 2 tabs for 2 days, then 1 tab for 2 days, then 1/2 tab for 2 days then return to your daily 2.5 mg dose. Take in AM with food.  Promethazine DM cough syrup 5 mL every 6 hours as needed for cough. Use with caution as this may cause drowsiness and could increase risk for falls. Do not drive after taking Use benzonatate 1 capsule Three times a day for the next few days then as needed for cough  Follow up in 2 weeks via virtual visit with Katie Antwyne Pingree,NP then 3 months with Dr. [name]. If symptoms do not improve or worsen, please contact office for sooner follow up or seek emergency care.

## 2022-03-29 NOTE — Progress Notes (Deleted)
_0  ID: Erin Zavala, female    DOB: 1965/07/05, 57 y.o.   MRN: [id number]  Chief Complaint  Patient presents with   Follow-up    Pt f/u she states she feels like she is doing well. She feels like she has caught the flu she improves but then get re-occurring symptoms. Negative for COVID. She is currently fight a cough and she was prescribed a z-pak (1/14). She feels like she made need a taper. Using 2-3Lpm    Referring provider: No ref. provider found  HPI:   TEST/EVENTS:   No Known Allergies  Immunization History  Administered Date(s) Administered   Influenza,inj,Quad PF,6+ Mos 02/28/2016, 12/18/2016, 01/02/2018, 11/04/2018, 12/24/2019, 12/20/2021   PPD Test 02/28/2016   Pneumococcal Conjugate-13 11/29/2016   Pneumococcal Polysaccharide-23 11/03/2015    Past Medical History:  Diagnosis Date   Asthma    as a child   CHF (congestive heart failure) (HCC)    Cough    Dyspnea    GERD (gastroesophageal reflux disease)    Migraines    Oxygen dependent    Pulmonary hypertension ([place])    Sarcoidosis     Tobacco History: Social History   Tobacco Use  Smoking Status Former   Packs/day: 0.25   Years: 10.00   Total pack years: 2.50   Types: Cigarettes   Start date: 39   Quit date: 01/11/2011   Years since quitting: 11.2  Smokeless Tobacco Never   Counseling given: Not Answered   Outpatient Medications Prior to Visit  Medication Sig Dispense Refill   ADCIRCA 20 MG tablet TAKE 2 TABLETS BY MOUTH 1 TIME A DAY. 60 tablet 0   albuterol (VENTOLIN HFA) 108 (90 Base) MCG/ACT inhaler INHALE 2 PUFFS INTO THE LUNGS EVERY 4 HOURS AS NEEDED FOR WHEEZE OR FOR SHORTNESS OF BREATH 18 each 2   aspirin 81 MG tablet Take 1 tablet (81 mg total) by mouth daily. 30 tablet 0   atorvastatin (LIPITOR) 80 MG tablet TAKE 1 TABLET EVERY DAY 90 tablet 3   benzonatate (TESSALON) 100 MG capsule Take 1 capsule (100 mg total) by mouth 3 (three) times daily as needed. 30 capsule 0    cetirizine (ZYRTEC) 10 MG tablet TAKE 1 TABLET EVERY DAY 90 tablet 3   esomeprazole (NEXIUM) 40 MG capsule TAKE 1 CAPSULE EVERY DAY 45 capsule 3   ferrous sulfate 325 (65 FE) MG tablet TAKE 1 TABLET (325 MG TOTAL) BY MOUTH 2 (TWO) TIMES DAILY WITH A MEAL. 60 tablet 5   fluticasone (FLONASE) 50 MCG/ACT nasal spray Place 2 sprays into both nostrils daily. 16 g 0   fluticasone furoate-vilanterol (BREO ELLIPTA) 200-25 MCG/ACT AEPB INHALE 1 PUFF INTO THE LUNGS DAILY. 180 each 0   guaiFENesin (MUCINEX) 600 MG 12 hr tablet Take 600 mg by mouth 2 (two) times daily as needed.     ipratropium-albuterol (DUONEB) 0.5-2.5 (3) MG/3ML SOLN TAKE 3 MLS BY NEBULIZATION EVERY 6 (SIX) HOURS AS NEEDED. 360 mL 10   OPSUMIT 10 MG tablet Take 10 mg by mouth daily.     OXYGEN 2-3 lpm 24/7  [facility]     predniSONE (DELTASONE) 1 MG tablet TAKE 2.5 TABLETS (2.5 MG TOTAL) BY MOUTH DAILY WITH BREAKFAST. 75 tablet 0   promethazine-dextromethorphan (PROMETHAZINE-DM) 6.25-15 MG/5ML syrup Take 5 mLs by mouth 4 (four) times daily as needed. 118 mL 0   Treprostinil (TYVASO DPI TITRATION KIT) 16 & 32 & 48 MCG POWD Inhale 16 mcg into the lungs in the morning, at  noon, in the evening, and at bedtime. 112 each 11   azithromycin (ZITHROMAX) 250 MG tablet Take 2 tablets on day 1, then 1 tablet daily on days 2 through 5 6 tablet 0   ambrisentan (LETAIRIS) 10 MG tablet Take 1 tablet (10 mg total) by mouth daily. (Patient not taking: Reported on 12/20/2021) 30 tablet 11   No facility-administered medications prior to visit.     Review of Systems:   Constitutional: No weight loss or gain, night sweats, fevers, chills, fatigue, or lassitude. HEENT: No headaches, difficulty swallowing, tooth/dental problems, or sore throat. No sneezing, itching, ear ache, nasal congestion, or post nasal drip CV:  No chest pain, orthopnea, PND, swelling in lower extremities, anasarca, dizziness, palpitations, syncope Resp: No shortness of breath with exertion  or at rest. No excess mucus or change in color of mucus. No productive or non-productive. No hemoptysis. No wheezing.  No chest wall deformity GI:  No heartburn, indigestion, abdominal pain, nausea, vomiting, diarrhea, change in bowel habits, loss of appetite, bloody stools.  GU: No dysuria, change in color of urine, urgency or frequency.  No flank pain, no hematuria  Skin: No rash, lesions, ulcerations MSK:  No joint pain or swelling.  No decreased range of motion.  No back pain. Neuro: No dizziness or lightheadedness.  Psych: No depression or anxiety. Mood stable.     Physical Exam:  Ht _0  (1.549 m)   Wt 163 lb 3.2 oz (74 kg)   BMI 30.84 kg/m   GEN: Pleasant, interactive, well-nourished/chronically-ill appearing/acutely-ill appearing/poorly-nourished/morbidly obese; in no acute distress.****** HEENT:  Normocephalic and atraumatic. EACs patent bilaterally. TM pearly gray with present light reflex bilaterally. PERRLA. Sclera white. Nasal turbinates pink, moist and patent bilaterally. No rhinorrhea present. Oropharynx pink and moist, without exudate or edema. No lesions, ulcerations, or postnasal drip.  NECK:  Supple w/ fair ROM. No JVD present. Normal carotid impulses w/o bruits. Thyroid symmetrical with no goiter or nodules palpated. No lymphadenopathy.   CV: RRR, no m/r/g, no peripheral edema. Pulses intact, +2 bilaterally. No cyanosis, pallor or clubbing. PULMONARY:  Unlabored, regular breathing. Clear bilaterally A&P w/o wheezes/rales/rhonchi. No accessory muscle use.  GI: BS present and normoactive. Soft, non-tender to palpation. No organomegaly or masses detected. No CVA tenderness. MSK: No erythema, warmth or tenderness. Cap refil <2 sec all extrem. No deformities or joint swelling noted.  Neuro: A/Ox3. No focal deficits noted.   Skin: Warm, no lesions or rashe Psych: Normal affect and behavior. Judgement and thought content appropriate.     Lab Results:  CBC    Component  Value Date/Time   WBC 8.5 02/17/2018 0950   RBC 4.50 02/17/2018 0950   HGB 13.0 02/17/2018 0950   HCT 40.7 02/17/2018 0950   PLT 298.0 02/17/2018 0950   MCV 90.5 02/17/2018 0950   MCH 29.0 01/13/2016 1245   MCHC 31.8 02/17/2018 0950   RDW 15.0 02/17/2018 0950   LYMPHSABS 1.1 09/26/2016 1707   MONOABS 0.7 09/26/2016 1707   EOSABS 0.3 09/26/2016 1707   BASOSABS 0.1 09/26/2016 1707    BMET    Component Value Date/Time   NA 139 12/13/2020 1050   K 3.8 12/13/2020 1050   CL 103 12/13/2020 1050   CO2 29 12/13/2020 1050   GLUCOSE 110 (H) 12/13/2020 1050   BUN 9 12/13/2020 1050   CREATININE 0.72 12/13/2020 1050   CALCIUM 8.8 (L) 12/13/2020 1050   GFRNONAA >60 12/13/2020 1050   GFRAA >60 03/16/2019 1505  BNP    Component Value Date/Time   BNP 59.8 12/13/2020 1050     Imaging:  No results found.       Latest Ref Rng & Units 02/07/2022    9:59 AM 06/26/2017   10:01 AM 11/28/2016   11:00 AM 08/23/2016   11:58 AM 12/27/2015   10:46 AM  PFT Results  FVC-Pre L 1.75  1.61  1.69  1.70  1.53   FVC-Predicted Pre % 59  66  68  69  61   FVC-Post L 1.76  1.67    1.56   FVC-Predicted Post % 59  68    63   Pre FEV1/FVC % % 82  84  87  87  79   Post FEV1/FCV % % 81  87    74   FEV1-Pre L 1.44  1.35  1.46  1.49  1.21   FEV1-Predicted Pre % 62  69  74  75  60   FEV1-Post L 1.43  1.46    1.16   DLCO uncorrected ml/min/mmHg 5.32  6.52  5.26  5.96  3.85   DLCO UNC% % 29  34  _0 DLCO corrected ml/min/mmHg 5.32   5.23  5.70    DLCO COR %Predicted % _1 DLVA Predicted % 47  59  51  54  47   TLC L 2.51  2.80    2.62   TLC % Predicted % 56  62    58   RV % Predicted % 48  78    70     No results found for: "NITRICOXIDE"      Assessment & Plan:   No problem-specific Assessment & Plan notes found for this encounter.   I spent *** minutes of dedicated to the care of this patient on the date of this encounter to include pre-visit review of records,  face-to-face time with the patient discussing conditions above, post visit ordering of testing, clinical documentation with the electronic health record, making appropriate referrals as documented, and communicating necessary findings to members of the patients care team.  Clayton Bibles, NP 03/29/2022  Pt aware and understands NP's role.

## 2022-03-30 ENCOUNTER — Encounter: Payer: Self-pay | Admitting: Nurse Practitioner

## 2022-03-30 DIAGNOSIS — J209 Acute bronchitis, unspecified: Secondary | ICD-10-CM | POA: Insufficient documentation

## 2022-03-30 NOTE — Assessment & Plan Note (Signed)
Euvolemic with stable weights per her report. She is adherent to current regimen. Follow up with Dr. [name] as scheduled.

## 2022-03-30 NOTE — Assessment & Plan Note (Signed)
Stable without increased O2 requirement. Goal >88-90% 

## 2022-03-30 NOTE — Assessment & Plan Note (Signed)
See above

## 2022-03-30 NOTE — Progress Notes (Signed)
Patient ID: Erin Zavala, female     DOB: Dec 13, 1965, 57 y.o.      MRN: [id number]  Chief Complaint  Patient presents with   Follow-up    Pt f/u she states she feels like she is doing well. She feels like she has caught the flu she improves but then get re-occurring symptoms. Negative for COVID. She is currently fight a cough and she was prescribed a z-pak (1/14). She feels like she made need a taper. Using 2-3Lpm    Virtual Visit via Video Note  I connected with Erin Zavala on 03/30/22 at  3:30 PM EST by a video enabled telemedicine application and verified that I am speaking with the correct person using two identifiers.  Location: Patient: Home Provider: Office   I discussed the limitations of evaluation and management by telemedicine and the availability of in person appointments. The patient expressed understanding and agreed to proceed.  History of Present Illness: 57 year old female, former smoker followed for pulmonary sarcoidosis, chronic respiratory failure, COPD, and pulmonary hypertension. She is a patient of Dr. Agustina Caroli and last seen in office 02/07/2022. Past medical history significant for CAD, allergic rhinitis, GERD.  02/07/2022: OV with Dr. [name]. Evidence of stage III sarcoidosis changes. Titrating down on her steroids; now on 1 mg daily. May be reasonable to wean off and see how she does. Change to every other day. Reviewed symptoms of adrenal insufficiency. Continued on Breo for management of COPD. Followed by Dr. [name] for [facility].   03/29/2022: Today - acute Patient presents today for acute virtual visit. She tells me that over the past two weeks or so, she has had a persistent cough with yellow sputum production. She had some nasal congestion and postnasal drainage as well. She did an e-visit with her PCP who prescribed azithromycin and benzonatate on 1/14. She tells me today that she is feeling a tad better, cough seems to be a little clear on the azithromycin (one  dose left) but she is still coughing and it hurts at times in her chest when she does. She feels she may need a course of steroids. She denies fevers, chills, hemoptysis, wheezing, increased shortness of breath. She is using her Breo inhaler. No increased O2 requirement. She admits adherence to her PAH regimen; no increased weight gain, swelling, or orthopnea.   No Known Allergies Immunization History  Administered Date(s) Administered   Influenza,inj,Quad PF,6+ Mos 02/28/2016, 12/18/2016, 01/02/2018, 11/04/2018, 12/24/2019, 12/20/2021   PPD Test 02/28/2016   Pneumococcal Conjugate-13 11/29/2016   Pneumococcal Polysaccharide-23 11/03/2015   Past Medical History:  Diagnosis Date   Asthma    as a child   CHF (congestive heart failure) (HCC)    Cough    Dyspnea    GERD (gastroesophageal reflux disease)    Migraines    Oxygen dependent    Pulmonary hypertension (HCC)    Sarcoidosis     Tobacco History: Social History   Tobacco Use  Smoking Status Former   Packs/day: 0.25   Years: 10.00   Total pack years: 2.50   Types: Cigarettes   Start date: 85   Quit date: 01/11/2011   Years since quitting: 11.2  Smokeless Tobacco Never   Counseling given: Not Answered   Outpatient Medications Prior to Visit  Medication Sig Dispense Refill   ADCIRCA 20 MG tablet TAKE 2 TABLETS BY MOUTH 1 TIME A DAY. 60 tablet 0   albuterol (VENTOLIN HFA) 108 (90 Base) MCG/ACT inhaler INHALE 2 PUFFS INTO  THE LUNGS EVERY 4 HOURS AS NEEDED FOR WHEEZE OR FOR SHORTNESS OF BREATH 18 each 2   aspirin 81 MG tablet Take 1 tablet (81 mg total) by mouth daily. 30 tablet 0   atorvastatin (LIPITOR) 80 MG tablet TAKE 1 TABLET EVERY DAY 90 tablet 3   benzonatate (TESSALON) 100 MG capsule Take 1 capsule (100 mg total) by mouth 3 (three) times daily as needed. 30 capsule 0   cetirizine (ZYRTEC) 10 MG tablet TAKE 1 TABLET EVERY DAY 90 tablet 3   esomeprazole (NEXIUM) 40 MG capsule TAKE 1 CAPSULE EVERY DAY 45 capsule 3    ferrous sulfate 325 (65 FE) MG tablet TAKE 1 TABLET (325 MG TOTAL) BY MOUTH 2 (TWO) TIMES DAILY WITH A MEAL. 60 tablet 5   fluticasone (FLONASE) 50 MCG/ACT nasal spray Place 2 sprays into both nostrils daily. 16 g 0   fluticasone furoate-vilanterol (BREO ELLIPTA) 200-25 MCG/ACT AEPB INHALE 1 PUFF INTO THE LUNGS DAILY. 180 each 0   guaiFENesin (MUCINEX) 600 MG 12 hr tablet Take 600 mg by mouth 2 (two) times daily as needed.     ipratropium-albuterol (DUONEB) 0.5-2.5 (3) MG/3ML SOLN TAKE 3 MLS BY NEBULIZATION EVERY 6 (SIX) HOURS AS NEEDED. 360 mL 10   OPSUMIT 10 MG tablet Take 10 mg by mouth daily.     OXYGEN 2-3 lpm 24/7  [facility]     predniSONE (DELTASONE) 1 MG tablet TAKE 2.5 TABLETS (2.5 MG TOTAL) BY MOUTH DAILY WITH BREAKFAST. 75 tablet 0   Treprostinil (TYVASO DPI TITRATION KIT) 16 & 32 & 48 MCG POWD Inhale 16 mcg into the lungs in the morning, at noon, in the evening, and at bedtime. 112 each 11   azithromycin (ZITHROMAX) 250 MG tablet Take 2 tablets on day 1, then 1 tablet daily on days 2 through 5 6 tablet 0   promethazine-dextromethorphan (PROMETHAZINE-DM) 6.25-15 MG/5ML syrup Take 5 mLs by mouth 4 (four) times daily as needed. 118 mL 0   ambrisentan (LETAIRIS) 10 MG tablet Take 1 tablet (10 mg total) by mouth daily. (Patient not taking: Reported on 12/20/2021) 30 tablet 11   No facility-administered medications prior to visit.     Review of Systems:   Constitutional: No weight loss or gain, night sweats, fevers, chills, fatigue, or lassitude. HEENT: No headaches, difficulty swallowing, tooth/dental problems, or sore throat. No sneezing, itching, ear ache, nasal congestion, or post nasal drip CV:  No chest pain, orthopnea, PND, swelling in lower extremities, anasarca, dizziness, palpitations, syncope Resp: +cough; chest congestion. No increased shortness of breath with exertion or at rest.  No hemoptysis. No wheezing.  No chest wall deformity GI:  No heartburn, indigestion, abdominal  pain, nausea, vomiting, diarrhea, change in bowel habits, loss of appetite, bloody stools.  GU: No dysuria, change in color of urine, urgency or frequency.   Skin: No rash, lesions, ulcerations MSK:  No joint pain or swelling.   Neuro: No dizziness or lightheadedness.  Psych: No depression or anxiety. Mood stable.   Observations/Objective: Patient is well-developed, well-nourished in no acute distress. A&Ox3. Resting comfortably at home. Unlabored, regular breathing. On supplemental O2. Bronchitic cough. Speech is clear and coherent with logical content.   Assessment and Plan: Acute bronchitis with COPD ([place]) Acute bronchitis vs sarcoid flare from URI. She is completing azithromycin course. We will treat her with prednisone taper and then have her restart low dose steroids until she is seen back. Cough control measures advised. She was instructed she would need in person  evaluation with imaging if symptoms did not improve. Action plan in place. Continue bronchodilator regimen.   Patient Instructions  Continue Albuterol inhaler 2 puffs or duoneb 3 mL neb every 6 hours as needed for shortness of breath or wheezing. Notify if symptoms persist despite rescue inhaler/neb use.  Continue Breo 1 puff daily. Brush tongue and rinse mouth afterwards Continue cetirizine 1 tab daily Continue nexium 1 capsule daily Continue fluticasone 2 sprays each nostril daily Continue guaifenesin 500 mg Twice daily for congestion/cough Continue supplemental oxygen 2-3 lpm for goal >88-90% Continue Tyvaso as you have been Continue adcirca 2 tabs daily Continue opsumit 1 tab daily  Complete azithromycin Prednisone taper. 4 tabs for 2 days, then 3 tabs for 2 days, 2 tabs for 2 days, then 1 tab for 2 days, then 1/2 tab for 2 days then return to your daily 2.5 mg dose. Take in AM with food.  Promethazine DM cough syrup 5 mL every 6 hours as needed for cough. Use with caution as this may cause drowsiness and could  increase risk for falls. Do not drive after taking Use benzonatate 1 capsule Three times a day for the next few days then as needed for cough  Follow up in 2 weeks via virtual visit with Katie Ghada Abbett,NP then 3 months with Dr. [name]. If symptoms do not improve or worsen, please contact office for sooner follow up or seek emergency care.    Pulmonary sarcoidosis ([place]) See above  Chronic respiratory failure with hypoxia (HCC) Stable without increased O2 requirement. Goal >88-90%  Pulmonary hypertension (HCC) Euvolemic with stable weights per her report. She is adherent to current regimen. Follow up with Dr. [name] as scheduled.     I discussed the assessment and treatment plan with the patient. The patient was provided an opportunity to ask questions and all were answered. The patient agreed with the plan and demonstrated an understanding of the instructions.   The patient was advised to call back or seek an in-person evaluation if the symptoms worsen or if the condition fails to improve as anticipated.  I provided 31 minutes of non-face-to-face time during this encounter.   Clayton Bibles, NP

## 2022-03-30 NOTE — Assessment & Plan Note (Signed)
Acute bronchitis vs sarcoid flare from URI. She is completing azithromycin course. We will treat her with prednisone taper and then have her restart low dose steroids until she is seen back. Cough control measures advised. She was instructed she would need in person evaluation with imaging if symptoms did not improve. Action plan in place. Continue bronchodilator regimen.   Patient Instructions  Continue Albuterol inhaler 2 puffs or duoneb 3 mL neb every 6 hours as needed for shortness of breath or wheezing. Notify if symptoms persist despite rescue inhaler/neb use.  Continue Breo 1 puff daily. Brush tongue and rinse mouth afterwards Continue cetirizine 1 tab daily Continue nexium 1 capsule daily Continue fluticasone 2 sprays each nostril daily Continue guaifenesin 500 mg Twice daily for congestion/cough Continue supplemental oxygen 2-3 lpm for goal >88-90% Continue Tyvaso as you have been Continue adcirca 2 tabs daily Continue opsumit 1 tab daily  Complete azithromycin Prednisone taper. 4 tabs for 2 days, then 3 tabs for 2 days, 2 tabs for 2 days, then 1 tab for 2 days, then 1/2 tab for 2 days then return to your daily 2.5 mg dose. Take in AM with food.  Promethazine DM cough syrup 5 mL every 6 hours as needed for cough. Use with caution as this may cause drowsiness and could increase risk for falls. Do not drive after taking Use benzonatate 1 capsule Three times a day for the next few days then as needed for cough  Follow up in 2 weeks via virtual visit with Katie Ted Goodner,NP then 3 months with Dr. [name]. If symptoms do not improve or worsen, please contact office for sooner follow up or seek emergency care.

## 2022-04-05 ENCOUNTER — Other Ambulatory Visit (HOSPITAL_COMMUNITY): Payer: Self-pay

## 2022-04-09 ENCOUNTER — Telehealth (HOSPITAL_COMMUNITY): Payer: Self-pay | Admitting: Pharmacy Technician

## 2022-04-09 NOTE — Telephone Encounter (Signed)
Advanced Heart Failure Patient Advocate Encounter  Prior Authorization for Tadalafil has been approved.    PA# [id number] Effective dates: 04/09/22 through 04/10/23  Charlann Boxer, CPhT

## 2022-04-16 ENCOUNTER — Ambulatory Visit (HOSPITAL_COMMUNITY): Admission: RE | Admit: 2022-04-16 | Payer: Self-pay | Source: Ambulatory Visit

## 2022-04-16 ENCOUNTER — Other Ambulatory Visit (HOSPITAL_COMMUNITY): Payer: Self-pay

## 2022-04-16 ENCOUNTER — Encounter (HOSPITAL_COMMUNITY): Payer: 59 | Admitting: Cardiology

## 2022-04-16 ENCOUNTER — Telehealth (HOSPITAL_COMMUNITY): Payer: Self-pay | Admitting: Pharmacist

## 2022-04-16 NOTE — Telephone Encounter (Signed)
Patient Advocate Encounter   Received notification from [facility] that prior authorization for Adcirca (brand) is required.   PA submitted on CoverMyMeds Key [id number] Status is pending   Will continue to follow.   Audry Riles, PharmD, BCPS, BCCP, CPP Heart Failure Clinic Pharmacist [phone]

## 2022-04-19 ENCOUNTER — Other Ambulatory Visit (HOSPITAL_COMMUNITY): Payer: Self-pay

## 2022-04-20 ENCOUNTER — Telehealth (HOSPITAL_COMMUNITY): Payer: Self-pay | Admitting: Pharmacy Technician

## 2022-04-20 ENCOUNTER — Other Ambulatory Visit (HOSPITAL_COMMUNITY): Payer: Self-pay

## 2022-04-20 NOTE — Telephone Encounter (Signed)
Patient Advocate Encounter   Received notification from [facility] that prior authorization for Opsumit is required.   PA submitted on CoverMyMeds Key [id number] Status is pending   Will continue to follow.

## 2022-04-20 NOTE — Telephone Encounter (Signed)
Advanced Heart Failure Patient Advocate Encounter  Called and spoke with the patient's insurance. Was told that the PA wouldn't be reviewed until 02/15. Asked for review to be expedited as it says 72 hours online when submitted. Review is now updated and are hopeful to have a determination today.

## 2022-04-23 NOTE — Telephone Encounter (Signed)
Advanced Heart Failure Patient Advocate Encounter  PA denied. [facility]) sent appeal via fax, Friday, 02/09.  Will follow up.

## 2022-04-24 ENCOUNTER — Other Ambulatory Visit (HOSPITAL_COMMUNITY): Payer: Self-pay

## 2022-04-24 ENCOUNTER — Other Ambulatory Visit (HOSPITAL_COMMUNITY): Payer: Self-pay | Admitting: Pharmacist

## 2022-04-24 MED ORDER — TADALAFIL (PAH) 20 MG PO TABS: 40.0000 mg | ORAL_TABLET | Freq: Every day | ORAL | 11 refills | Status: DC

## 2022-04-24 MED ORDER — TADALAFIL 20 MG PO TABS
40.0000 mg | ORAL_TABLET | Freq: Every day | ORAL | 11 refills | Status: DC
  Filled 2022-04-24: qty 60, 30d supply, fill #0

## 2022-04-24 NOTE — Telephone Encounter (Signed)
Advanced Heart Failure Patient Advocate Encounter  First level appeal denied. Patient is aware we will pursue 2nd level appeal. If denied, the patient would be eligible to obtain brand Adcirca from [place].   In the meantime, the patient was willing to try generic as to not run out of medication. Sent 30 day RX request to [facility]) to send to [facility].

## 2022-04-25 ENCOUNTER — Telehealth (HOSPITAL_COMMUNITY): Payer: Self-pay | Admitting: Pharmacist

## 2022-04-25 ENCOUNTER — Other Ambulatory Visit (HOSPITAL_COMMUNITY): Payer: Self-pay

## 2022-04-25 ENCOUNTER — Other Ambulatory Visit (HOSPITAL_COMMUNITY): Payer: Self-pay | Admitting: Pharmacist

## 2022-04-25 MED ORDER — OPSUMIT 10 MG PO TABS: 10.0000 mg | ORAL_TABLET | Freq: Every day | ORAL | 11 refills | Status: DC

## 2022-04-25 NOTE — Telephone Encounter (Signed)
Advanced Heart Failure Patient Advocate Encounter  First level appeal for brand [name] has been denied. Spoke with patient and informed her of result. Patient wishes to try for second level appeal. Second level appeal faxed to [place] external review department. Marked as expedited. Patient will take generic tadalafil until determination is made. If second appeal is denied, patient has been informed by [facility] that they will supply her the medication.   Audry Riles, PharmD, BCPS, BCCP, CPP Heart Failure Clinic Pharmacist [phone]

## 2022-04-26 ENCOUNTER — Encounter (HOSPITAL_COMMUNITY): Payer: Self-pay

## 2022-04-26 ENCOUNTER — Other Ambulatory Visit (HOSPITAL_COMMUNITY): Payer: Self-pay

## 2022-04-26 NOTE — Telephone Encounter (Signed)
Advanced Heart Failure Patient Advocate Encounter  Prior Authorization for Opsumit has been approved.    PA# [id number] Effective dates: 04/26/22 through 04/27/23  Charlann Boxer, CPhT

## 2022-04-26 NOTE — Telephone Encounter (Signed)
Advanced Heart Failure Patient Advocate Encounter  Called [facility]. Asked the status of patient's refill. Provided PA approval information. They have to complete a benefits investigation. I could not get them to process the claim while I was on the phone. Will call back tomorrow and try to push refill through.  Called and updated the patient.

## 2022-04-27 ENCOUNTER — Telehealth (HOSPITAL_COMMUNITY): Payer: Self-pay | Admitting: Licensed Clinical Social Worker

## 2022-04-27 NOTE — Telephone Encounter (Signed)
H&V Care Navigation CSW Progress Note  Clinical Social Worker consulted by pharmacy team to help with medication copays.  CSW able to assist in paying past due bill so she can continue to get her medications.   SDOH Screenings   Financial Resource Strain: High Risk (04/27/2022)  Tobacco Use: Medium Risk (03/30/2022)    Jorge Ny, LCSW Clinical Social Worker Advanced Heart Failure Clinic Desk#: [phone] Cell#: [phone]

## 2022-04-29 DIAGNOSIS — J9602 Acute respiratory failure with hypercapnia: Secondary | ICD-10-CM | POA: Diagnosis not present

## 2022-04-29 DIAGNOSIS — J45909 Unspecified asthma, uncomplicated: Secondary | ICD-10-CM | POA: Diagnosis not present

## 2022-04-29 DIAGNOSIS — D869 Sarcoidosis, unspecified: Secondary | ICD-10-CM | POA: Diagnosis not present

## 2022-05-03 NOTE — Telephone Encounter (Signed)
Advanced Heart Failure Patient Advocate Encounter  Second level appeal for brand [name] has been denied. No other appeal options are left for this patient. Will need to get Adcirca from [facility] who indicated they would provide medication if appeal failed. Patient aware.    Audry Riles, PharmD, BCPS, BCCP, CPP Heart Failure Clinic Pharmacist [phone]

## 2022-05-03 NOTE — Telephone Encounter (Signed)
Advanced Heart Failure Patient Advocate Encounter  Second level appeal denial sent to [employer].  Will follow up.

## 2022-05-08 ENCOUNTER — Ambulatory Visit (HOSPITAL_COMMUNITY)
Admission: RE | Admit: 2022-05-08 | Discharge: 2022-05-08 | Disposition: A | Discharge status: Home / Self Care | Payer: Self-pay | Source: Ambulatory Visit | Attending: Cardiology | Admitting: Cardiology

## 2022-05-08 ENCOUNTER — Encounter (HOSPITAL_COMMUNITY): Payer: Self-pay | Admitting: Cardiology

## 2022-05-08 ENCOUNTER — Ambulatory Visit (HOSPITAL_BASED_OUTPATIENT_CLINIC_OR_DEPARTMENT_OTHER)
Admission: RE | Admit: 2022-05-08 | Discharge: 2022-05-08 | Disposition: A | Discharge status: Home / Self Care | Payer: Self-pay | Source: Ambulatory Visit | Attending: Cardiology | Admitting: Cardiology

## 2022-05-08 VITALS — BP 118/80 | HR 86 | Wt 160.8 lb

## 2022-05-08 DIAGNOSIS — Z79899 Other long term (current) drug therapy: Secondary | ICD-10-CM | POA: Insufficient documentation

## 2022-05-08 DIAGNOSIS — D869 Sarcoidosis, unspecified: Secondary | ICD-10-CM | POA: Insufficient documentation

## 2022-05-08 DIAGNOSIS — I2721 Secondary pulmonary arterial hypertension: Secondary | ICD-10-CM | POA: Insufficient documentation

## 2022-05-08 DIAGNOSIS — Z9981 Dependence on supplemental oxygen: Secondary | ICD-10-CM | POA: Insufficient documentation

## 2022-05-08 DIAGNOSIS — I509 Heart failure, unspecified: Secondary | ICD-10-CM | POA: Insufficient documentation

## 2022-05-08 DIAGNOSIS — J9611 Chronic respiratory failure with hypoxia: Secondary | ICD-10-CM | POA: Insufficient documentation

## 2022-05-08 DIAGNOSIS — Z955 Presence of coronary angioplasty implant and graft: Secondary | ICD-10-CM | POA: Insufficient documentation

## 2022-05-08 DIAGNOSIS — K219 Gastro-esophageal reflux disease without esophagitis: Secondary | ICD-10-CM | POA: Insufficient documentation

## 2022-05-08 DIAGNOSIS — I5022 Chronic systolic (congestive) heart failure: Secondary | ICD-10-CM

## 2022-05-08 DIAGNOSIS — Z7952 Long term (current) use of systemic steroids: Secondary | ICD-10-CM | POA: Insufficient documentation

## 2022-05-08 DIAGNOSIS — Z942 Lung transplant status: Secondary | ICD-10-CM | POA: Insufficient documentation

## 2022-05-08 DIAGNOSIS — Z7982 Long term (current) use of aspirin: Secondary | ICD-10-CM | POA: Insufficient documentation

## 2022-05-08 DIAGNOSIS — I2510 Atherosclerotic heart disease of native coronary artery without angina pectoris: Secondary | ICD-10-CM | POA: Insufficient documentation

## 2022-05-08 DIAGNOSIS — I272 Pulmonary hypertension, unspecified: Secondary | ICD-10-CM

## 2022-05-08 LAB — ECHOCARDIOGRAM COMPLETE
Area-P 1/2: 3.65 cm2
Calc EF: 59.7 %
S' Lateral: 2.4 cm
Single Plane A2C EF: 57.7 %
Single Plane A4C EF: 60.5 %

## 2022-05-08 LAB — LIPID PANEL
Cholesterol: 148 mg/dL (ref 0–200)
HDL: 42 mg/dL (ref >40)
LDL Cholesterol: 93 mg/dL (ref 0–99)
Total CHOL/HDL Ratio: 3.5 RATIO
Triglycerides: 65 mg/dL (ref <150)
VLDL: 13 mg/dL (ref 0–40)

## 2022-05-08 LAB — BASIC METABOLIC PANEL
Anion gap: 10 (ref 5–15)
BUN: 5 mg/dL — ABNORMAL LOW (ref 6–20)
CO2: 26 mmol/L (ref 22–32)
Calcium: 9.3 mg/dL (ref 8.9–10.3)
Chloride: 102 mmol/L (ref 98–111)
Creatinine, Ser: 0.73 mg/dL (ref 0.44–1.00)
GFR, Estimated: >60 mL/min (ref >60)
Glucose, Bld: 90 mg/dL (ref 70–99)
Potassium: 4.2 mmol/L (ref 3.5–5.1)
Sodium: 138 mmol/L (ref 135–145)

## 2022-05-08 LAB — BRAIN NATRIURETIC PEPTIDE: B Natriuretic Peptide: 30.7 pg/mL (ref 0.0–100.0)

## 2022-05-08 NOTE — Progress Notes (Signed)
6 Min Walk Test Completed  Pt ambulated 365.7 meters O2 Sat ranged 100%-92% on 3L oxygen HR ranged 87-130

## 2022-05-08 NOTE — Patient Instructions (Signed)
There has been no changes to your medications.  Labs done today, your results will be available in MyChart, we will contact you for abnormal readings.  Your physician recommends that you schedule a follow-up appointment in: 6 months (August)  **please call the office in May to arrange your follow up appointment. **  If you have any questions or concerns before your next appointment please send [place] a message through [place] or call our office at [phone].    TO LEAVE A MESSAGE FOR THE NURSE SELECT OPTION 2, PLEASE LEAVE A MESSAGE INCLUDING: YOUR NAME DATE OF BIRTH CALL BACK NUMBER REASON FOR CALL**this is important as we prioritize the call backs  YOU WILL RECEIVE A CALL BACK THE SAME DAY AS LONG AS YOU CALL BEFORE 4:00 PM  At the [facility], you and your health needs are our priority. As part of our continuing mission to provide you with exceptional heart care, we have created designated Provider Care Teams. These Care Teams include your primary Cardiologist (physician) and Advanced Practice Providers (APPs- Physician Assistants and Nurse Practitioners) who all work together to provide you with the care you need, when you need it.   You may see any of the following providers on your designated Care Team at your next follow up: Dr Glori Bickers Dr Loralie Champagne Dr. Roxana Hires, NP Lyda Jester, [facility] [place], [place] Forestine Na, NP Audry Riles, PharmD   Please be sure to bring in all your medications bottles to every appointment.    Thank you for choosing [facility]

## 2022-05-08 NOTE — Progress Notes (Signed)
Echocardiogram 2D Echocardiogram has been performed.  Fidel Levy 05/08/2022, 2:51 PM

## 2022-05-09 ENCOUNTER — Other Ambulatory Visit (HOSPITAL_COMMUNITY): Payer: Self-pay

## 2022-05-09 ENCOUNTER — Other Ambulatory Visit (HOSPITAL_COMMUNITY): Payer: Self-pay | Admitting: Pharmacist

## 2022-05-09 MED ORDER — ADCIRCA 20 MG PO TABS: 40.0000 mg | ORAL_TABLET | Freq: Every day | ORAL | 11 refills | Status: DC

## 2022-05-09 NOTE — Progress Notes (Signed)
Date:  05/09/2022   ID:  [name], DOB 12-Nov-1965, MRN [id number]  Provider location: [place] [facility] Failure Type of Visit: Established patient  PCP:  [facility]  Cardiologist: Dr. [name]   History of Present Illness: Erin Zavala is a 57 y.o. female who has history of sarcoidosis, chronic hypoxemic respiratory failure on home oxygen, and pulmonary hypertension.  She was diagnosed with ocular sarcoidosis in 1997. It seems like she did not have significant lung complications (that were recognized at least) until early 2017.  She now requires home oxygen.  CTA chest shows changes consistent with sarcoidosis, this has been confirmed by biopsy.  Echo in 8/17 had evidence for RV strain and elevated PA pressure.  [name] in 10/17 confirmed pulmonary arterial hypertension with normal right and left heart filling pressures.    She has started on macitentan followed by Adcirca and most recently Tyvaso.  She did not tolerate selexipag well. She feels like these medications have helped her breathing.     She went to [facility] for transplant evaluation in 4/18.  As part of that evaluation, she had right and left heart cath.  Surprisingly, given age and lack of family history, she had an 80% LAD stenosis.  This was treated with DES.  RHC showed mild to moderate pulmonary hypertension. Transplant evaluation is currently on hold as she has been doing better symptomatically.   Echo in 8/20 showed EF 55-60%, normal RV, PASP 40 mmHg.  Echo in 8/21 showed EF 60-65% with normal RV, PASP 31 mmHg.    Echo in 12/21 with EF 60-65%, normal RV, PASP 31 mmHg, IVC normal.   Echo was done today and reviewed, EF 60-65%, mild LVH, normal RV with PASP 29 mmHg.    She returns for followup of pulmonary hypertension.  She is using 2-3 L home oxygen.  She is down to prednisone 1 mg daily.  I have not seen her in a couple of years but she seems to be stable. Occasional congestion.  No  chest pain.  No lightheadedness.  Occasional mild ankle edema.  She can walk on flat ground with her oxygen with no dyspnea.  6 minute walk improved today. Review of pulmonary notes suggests that sarcoidosis is stable. She is working from home.    ECG (personally reviewed): NSR, normal  Labs (9/17): NA negative, ESR 112, anti-dsDNA negative, anti-CCP negative, RF < 14.  ACE 136.  Labs (10/17): K 4.1, creatinine 0.8 Labs (11/17): K 3.8, creatinine 0.91 Labs (3/18): BNP 16 Labs (5/18): K 4.6, creatinine 0.74 Labs (7/18): creatinine 0.79 Labs (9/18): K 4.3, creatinine 0.76 Labs (10/18): LDL 62, HDL 40 Labs (12/19): K 3.6, creatinine 0.73 Labs (1/20): BNP 17.7 Labs (3/20): LDL 67, HDL 65, TGs 185  Labs (8/20): BNP 53 Labs (1/21): K 3.9, creatinine 0.93, BNP 51, LDL 117 Labs (7/21): LDL 62, HDL 60 Labs (12/21): BNP 96, K 3.9, creatinine 0.91 Labs (3/22): BNP 42, LDL 76 Labs (10/22): BNP 60, K 3.8, creatinine 0.72   6 minute walk (10/17): 305 m.  6 minute walk (1/18): 305 m 6 minute walk (3/18): 270 m 6 minute walk (6/18): 302 m 6 minute walk (9/18): 396 m 6 minute walk (2/19): 213 m (could have kept going but was stopped due to decreased oxygen saturation).  6 minute walk (4/19): 396 m 6 minute walk (7/19): 381 m 6 minute walk (8/20) 359 m 6 minute walk (1/21): 289 m 6 minute  walk (5/21): 229 m 6 minute walk (12/21): 274 m 6 minute walk (10/22): 236 m 6 minute walk (2/24): 366 m   PMH: 1. GERD 2. Pulmonary hypertension: Suspect Group 5 PH related to sarcoidosis.  - CTA chest (8/17) with no PE but mediastinal/hilar LAN, cystic lung changes, subpleural consolidation, patchy ground glass.  - Echo (8/17): EF 55-60%, D-shaped interventricular septum, PASP 60 mmHg. - ANA negative, ESR 112, anti-dsDNA negative, anti-CCP negative, RF < 14.  - RHC (10/17): mean RA 3, PA 59/22 mean 36, mean PCWP 5, CI 2.92, PVR 7.4 WU.  - PFTs (10/17): Restrictive.  - RHC (4/18): mean RA 4, PA mean 32,  mean PCWP 8, CI 2.9, PVR 5.2 WU.  - Did not tolerate selexipag.  - Echo (5/19): EF 60-65%, mild LVH, mild MR, RV normal size and systolic function, PASP 37 mmHg.  - Echo (8/20): EF 55-60%, normal RV size and systolic function, PASP 40 mmHg, IVC normal - Echo (8/21): EF 60-65% with normal RV, PASP 31 mmHg.   - Echo (12/21): EF 60-65%, normal RV, PASP 31 mmHg, IVC normal - Echo (2/24): EF 60-65%, mild LVH, normal RV with PASP 29 mmHg. 3. Sarcoidosis: Diagnosed with ocular sarcoidosis in 1997. Significant pulmonary involvement noted in 8/17.  Lung biopsy positive.  - Cardiac MRI (5/18): EF 66%, normal RV size with low normal RV systolic function (EF [id number]), no LGE (no evidence for cardiac sarcoidosis).  - CT chest 3/19 with stable pulmonary fibrotic changes related to sarcoidosis.  - PFTs (4/19): FVC 66%, FEV1 69%, ratio 103%, DLCO 37%, TLC 62% => moderate -severe mixed obstruction/restriction.  4. Chronic hypoxemic respiratory failure: Home oxygen 5. Childhood asthma. 6. CAD: LHC (4/18) with 80% LAD stenosis => DES to LAD.   Current Outpatient Medications  Medication Sig Dispense Refill   albuterol (VENTOLIN HFA) 108 (90 Base) MCG/ACT inhaler INHALE 2 PUFFS INTO THE LUNGS EVERY 4 HOURS AS NEEDED FOR WHEEZE OR FOR SHORTNESS OF BREATH 18 each 2   aspirin 81 MG tablet Take 1 tablet (81 mg total) by mouth daily. 30 tablet 0   atorvastatin (LIPITOR) 80 MG tablet TAKE 1 TABLET EVERY DAY 90 tablet 3   benzonatate (TESSALON) 100 MG capsule Take 1 capsule (100 mg total) by mouth 3 (three) times daily as needed. 30 capsule 0   cetirizine (ZYRTEC) 10 MG tablet TAKE 1 TABLET EVERY DAY 90 tablet 3   esomeprazole (NEXIUM) 40 MG capsule TAKE 1 CAPSULE EVERY DAY 45 capsule 3   ferrous sulfate 325 (65 FE) MG tablet TAKE 1 TABLET (325 MG TOTAL) BY MOUTH 2 (TWO) TIMES DAILY WITH A MEAL. 60 tablet 5   fluticasone (FLONASE) 50 MCG/ACT nasal spray Place 2 sprays into both nostrils daily. 16 g 0   fluticasone  furoate-vilanterol (BREO ELLIPTA) 200-25 MCG/ACT AEPB INHALE 1 PUFF INTO THE LUNGS DAILY. 180 each 0   guaiFENesin (MUCINEX) 600 MG 12 hr tablet Take 600 mg by mouth 2 (two) times daily as needed.     ipratropium-albuterol (DUONEB) 0.5-2.5 (3) MG/3ML SOLN TAKE 3 MLS BY NEBULIZATION EVERY 6 (SIX) HOURS AS NEEDED. 360 mL 10   OPSUMIT 10 MG tablet Take 1 tablet (10 mg total) by mouth daily. 30 tablet 11   OXYGEN 2-3 lpm 24/7  AHC     predniSONE (DELTASONE) 1 MG tablet TAKE 2.5 TABLETS (2.5 MG TOTAL) BY MOUTH DAILY WITH BREAKFAST. 75 tablet 0   promethazine-dextromethorphan (PROMETHAZINE-DM) 6.25-15 MG/5ML syrup Take 5 mLs by mouth 4 (  four) times daily as needed. 118 mL 0   tadalafil (CIALIS) 20 MG tablet Take 2 tablets (40 mg total) by mouth daily. 60 tablet 11   Treprostinil (TYVASO DPI TITRATION KIT) 16 & 32 & 48 MCG POWD Inhale 16 mcg into the lungs in the morning, at noon, in the evening, and at bedtime. 112 each 11   No current facility-administered medications for this encounter.    Allergies:   Patient has no known allergies.   Social History:  The patient  reports that she quit smoking about 11 years ago. Her smoking use included cigarettes. She started smoking about 36 years ago. She has a 2.50 pack-year smoking history. She has never used smokeless tobacco. She reports that she does not drink alcohol and does not use drugs.   Family History:  The patient's family history includes Hypertension in her father and mother; Sarcoidosis in her maternal aunt.   ROS:  Please see the history of present illness.   All other systems are personally reviewed and negative.   Exam:   BP 118/80   Pulse 86   Wt 72.9 kg (160 lb 12.8 oz)   SpO2 100% Comment: 3-liters of 02  BMI 30.38 kg/m  General: NAD Neck: No JVD, no thyromegaly or thyroid nodule.  Lungs: Clear to auscultation bilaterally with normal respiratory effort. CV: Nondisplaced PMI.  Heart regular S1/S2, no S3/S4, no murmur.  Trace  edema.  No carotid bruit.  Normal pedal pulses.  Abdomen: Soft, nontender, no hepatosplenomegaly, no distention.  Skin: Intact without lesions or rashes.  Neurologic: Alert and oriented x 3.  Psych: Normal affect. Extremities: No clubbing or cyanosis.  HEENT: Normal.   Recent Labs: 05/08/2022: B Natriuretic Peptide 30.7; BUN 5; Creatinine, Ser 0.73; Potassium 4.2; Sodium 138  Personally reviewed   Wt Readings from Last 3 Encounters:  05/08/22 72.9 kg (160 lb 12.8 oz)  03/29/22 74 kg (163 lb 3.2 oz)  02/07/22 73.5 kg (162 lb)      ASSESSMENT AND PLAN:  1. Sarcoidosis: Ocular and lung involvement, biopsy-proven.  Cardiac MRI in 5/18 did not show evidence for cardiac sarcoidosis.  She is on prednisone and home oxygen. She has been evaluated at [facility] for [facility] transplant, this is currently on hold as she had clinically improved on PH meds.  Breathing stable recently, down to 1 mg prednisone daily.   - Follows with Dr. [name].  Continue prednisone per pulmonary.  2. Pulmonary hypertension: Pulmonary arterial hypertension. Suspect mixed group 1 and group 5 PH. CTA chest showed no PE but did show changes of sarcoidosis.  PFTs were restrictive.  Echo showed RV strain and elevated PA pressure.  PAH was confirmed by [place] in 10/17 with PVR 7.4 WU.  Serologic workup was negative.  [place] in 4/18 showed mild to moderate residual pulmonary hypertension. She has felt like generic tadalafil has not been as effective as brand name Adcirca.  She is tolerating Tyvaso (unable to tolerate Malvin Johns).  Echo today showed normal-appearing RV and PASP 29 mmHg. She is not volume overloaded on exam. 6 minute walk today improved.   - Continue Opsumit   - Continue brand-name Adcirca 40 mg daily.  - Continue Tyvaso DPI.   - Check BNP today.  3. CAD: Found incidentally on cath 4/18 done as part of workup for lung transplantation.  80% LAD stenosis, treated with DES to LAD.   No chest pain.  - Continue ASA 81 mg daily.   -  Continue atorvastatin  80 mg daily, check lipids today.   Recommended follow-up:  6 months with APP  Signed, Loralie Champagne, MD  05/09/2022  [facility] [place] [address] [facility] and [place] [address] [phone] (office) [phone] (fax)

## 2022-05-09 NOTE — Telephone Encounter (Signed)
Advanced Heart Failure Patient Advocate Encounter   Patient was approved to receive Adcirca from [facility]. The medication will be dispensed through [facility].  Patient ID: [id number] Effective dates: 05/08/22 through 03/12/23  Called and spoke with the patient. Document scanned to chart.   Charlann Boxer, CPhT

## 2022-05-10 ENCOUNTER — Other Ambulatory Visit: Payer: Self-pay | Admitting: Emergency Medicine

## 2022-05-21 ENCOUNTER — Telehealth (HOSPITAL_COMMUNITY): Payer: Self-pay

## 2022-05-21 DIAGNOSIS — I5022 Chronic systolic (congestive) heart failure: Secondary | ICD-10-CM

## 2022-05-21 MED ORDER — EZETIMIBE 10 MG PO TABS: 10.0000 mg | ORAL_TABLET | Freq: Every day | ORAL | 11 refills | Status: DC

## 2022-05-21 NOTE — Telephone Encounter (Signed)
Patient advised and verbalized understanding,lab appointment scheduled,lab orders entered  Meds ordered this encounter  Medications   ezetimibe (ZETIA) 10 MG tablet    Sig: Take 1 tablet (10 mg total) by mouth daily.    Dispense:  30 tablet    Refill:  11   Orders Placed This Encounter  Procedures   Lipid panel    Standing Status:   Future    Standing Expiration Date:   05/21/2023    Order Specific Question:   Release to patient    Answer:   Immediate    Order Specific Question:   Release to patient    Answer:   Immediate [1]

## 2022-05-21 NOTE — Telephone Encounter (Signed)
-----   Message from Jerl Mina, RN sent at 05/18/2022  2:50 PM EST -----  ----- Message ----- From: Larey Dresser, MD Sent: 05/09/2022  10:43 AM EST To: Jerl Mina, RN; Jerl Mina, RN; #  LDL is too high. Add zetia 10 mg daily with lipid check in 2 mos.

## 2022-05-28 DIAGNOSIS — J45909 Unspecified asthma, uncomplicated: Secondary | ICD-10-CM | POA: Diagnosis not present

## 2022-05-28 DIAGNOSIS — J9602 Acute respiratory failure with hypercapnia: Secondary | ICD-10-CM | POA: Diagnosis not present

## 2022-05-28 DIAGNOSIS — D869 Sarcoidosis, unspecified: Secondary | ICD-10-CM | POA: Diagnosis not present

## 2022-06-28 DIAGNOSIS — J45909 Unspecified asthma, uncomplicated: Secondary | ICD-10-CM | POA: Diagnosis not present

## 2022-06-28 DIAGNOSIS — D869 Sarcoidosis, unspecified: Secondary | ICD-10-CM | POA: Diagnosis not present

## 2022-06-28 DIAGNOSIS — J9602 Acute respiratory failure with hypercapnia: Secondary | ICD-10-CM | POA: Diagnosis not present

## 2022-06-30 ENCOUNTER — Other Ambulatory Visit: Payer: Self-pay | Admitting: Emergency Medicine

## 2022-07-09 ENCOUNTER — Telehealth: Payer: Self-pay | Admitting: Physician Assistant

## 2022-07-09 DIAGNOSIS — J441 Chronic obstructive pulmonary disease with (acute) exacerbation: Secondary | ICD-10-CM

## 2022-07-09 MED ORDER — AZELASTINE HCL 0.05 % OP SOLN: 1.0000 [drp] | Freq: Two times a day (BID) | OPHTHALMIC | 0 refills | Status: DC

## 2022-07-09 MED ORDER — BENZONATATE 100 MG PO CAPS: 100.0000 mg | ORAL_CAPSULE | Freq: Three times a day (TID) | ORAL | 0 refills | Status: DC | PRN

## 2022-07-09 MED ORDER — DOXYCYCLINE HYCLATE 100 MG PO TABS: 100.0000 mg | ORAL_TABLET | Freq: Two times a day (BID) | ORAL | 0 refills | Status: DC

## 2022-07-09 MED ORDER — PREDNISONE 20 MG PO TABS: 40.0000 mg | ORAL_TABLET | Freq: Every day | ORAL | 0 refills | Status: DC

## 2022-07-09 NOTE — Progress Notes (Signed)
Virtual Visit Consent   Erin Zavala, you are scheduled for a virtual visit with a [facility] provider today. Just as with appointments in the office, your consent must be obtained to participate. Your consent will be active for this visit and any virtual visit you may have with one of our providers in the next 365 days. If you have a MyChart account, a copy of this consent can be sent to you electronically.  As this is a virtual visit, video technology does not allow for your provider to perform a traditional examination. This may limit your provider's ability to fully assess your condition. If your provider identifies any concerns that need to be evaluated in person or the need to arrange testing (such as labs, EKG, etc.), we will make arrangements to do so. Although advances in technology are sophisticated, we cannot ensure that it will always work on either your end or our end. If the connection with a video visit is poor, the visit may have to be switched to a telephone visit. With either a video or telephone visit, we are not always able to ensure that we have a secure connection.  By engaging in this virtual visit, you consent to the provision of healthcare and authorize for your insurance to be billed (if applicable) for the services provided during this visit. Depending on your insurance coverage, you may receive a charge related to this service.  I need to obtain your verbal consent now. Are you willing to proceed with your visit today? Erin Zavala has provided verbal consent on 07/09/2022 for a virtual visit (video or telephone). Erin Zavala, [place]  Date: 07/09/2022 7:57 AM  Virtual Visit via Video Note   I, Erin Zavala, connected with  Erin Zavala  ([id number], 1965-07-26) on 07/09/22 at  7:45 AM EDT by a video-enabled telemedicine application and verified that I am speaking with the correct person using two identifiers.  Location: Patient: Virtual Visit Location  Patient: Home Provider: Virtual Visit Location Provider: Home Office   I discussed the limitations of evaluation and management by telemedicine and the availability of in person appointments. The patient expressed understanding and agreed to proceed.    History of Present Illness: Erin Zavala is a 57 y.o. who identifies as a female who was assigned female at birth, and is being seen today for URI symptoms starting > 1 week of chest congestion and increased cough from baseline with some SOB with exertion only. Is keeping O2 2-3L/min. Denies fever, chills, aches. Denies chest pain. Some associated sinus headache but denies sinus pain. Has continued her regular COPD medications as directed.  OTC -- Cetirizine  O2  currently at 96% on 2L via N/c    HPI: HPI  Problems:  Patient Active Problem List   Diagnosis Date Noted   Acute bronchitis with COPD (HCC) 03/30/2022   Allergic rhinitis 06/16/2020   COPD (chronic obstructive pulmonary disease) (HCC) 09/18/2018   Cough 04/04/2018   Thrush 01/02/2018   Chronic respiratory failure with hypoxia (HCC) 08/19/2017   Coronary artery disease 07/23/2016   Health care maintenance 06/11/2016   Sinus congestion 03/23/2016   Hypoxemia 03/23/2016   Pulmonary hypertension (HCC) 12/28/2015   Acute bronchitis 12/28/2015   Childhood asthma    Pulmonary sarcoidosis (HCC) 11/02/2015   GERD (gastroesophageal reflux disease) 11/02/2015    Allergies: No Known Allergies Medications:  Current Outpatient Medications:    azelastine (OPTIVAR) 0.05 % ophthalmic solution, Place 1 drop into both eyes 2 (two)  times daily., Disp: 6 mL, Rfl: 0   benzonatate (TESSALON) 100 MG capsule, Take 1 capsule (100 mg total) by mouth 3 (three) times daily as needed for cough., Disp: 30 capsule, Rfl: 0   doxycycline (VIBRA-TABS) 100 MG tablet, Take 1 tablet (100 mg total) by mouth 2 (two) times daily., Disp: 20 tablet, Rfl: 0   ezetimibe (ZETIA) 10 MG tablet, Take 1 tablet (10  mg total) by mouth daily., Disp: 30 tablet, Rfl: 11   predniSONE (DELTASONE) 20 MG tablet, Take 2 tablets (40 mg total) by mouth daily with breakfast., Disp: 10 tablet, Rfl: 0   ADCIRCA 20 MG tablet, Take 2 tablets (40 mg total) by mouth daily., Disp: 60 tablet, Rfl: 11   albuterol (VENTOLIN HFA) 108 (90 Base) MCG/ACT inhaler, INHALE 2 PUFFS INTO THE LUNGS EVERY 4 HOURS AS NEEDED FOR WHEEZE OR FOR SHORTNESS OF BREATH, Disp: 18 each, Rfl: 2   aspirin 81 MG tablet, Take 1 tablet (81 mg total) by mouth daily., Disp: 30 tablet, Rfl: 0   atorvastatin (LIPITOR) 80 MG tablet, TAKE 1 TABLET EVERY DAY, Disp: 90 tablet, Rfl: 3   cetirizine (ZYRTEC) 10 MG tablet, TAKE 1 TABLET EVERY DAY, Disp: 90 tablet, Rfl: 3   esomeprazole (NEXIUM) 40 MG capsule, TAKE 1 CAPSULE EVERY DAY, Disp: 45 capsule, Rfl: 3   ferrous sulfate 325 (65 FE) MG tablet, TAKE 1 TABLET (325 MG TOTAL) BY MOUTH 2 (TWO) TIMES DAILY WITH A MEAL., Disp: 60 tablet, Rfl: 5   fluticasone furoate-vilanterol (BREO ELLIPTA) 200-25 MCG/ACT AEPB, INHALE 1 PUFF INTO THE LUNGS DAILY., Disp: 180 each, Rfl: 0   guaiFENesin (MUCINEX) 600 MG 12 hr tablet, Take 600 mg by mouth 2 (two) times daily as needed., Disp: , Rfl:    ipratropium-albuterol (DUONEB) 0.5-2.5 (3) MG/3ML SOLN, TAKE 3 MLS BY NEBULIZATION EVERY 6 (SIX) HOURS AS NEEDED., Disp: 360 mL, Rfl: 10   OPSUMIT 10 MG tablet, Take 1 tablet (10 mg total) by mouth daily., Disp: 30 tablet, Rfl: 11   OXYGEN, 2-3 lpm 24/7  AHC, Disp: , Rfl:    Treprostinil (TYVASO DPI TITRATION KIT) 16 & 32 & 48 MCG POWD, Inhale 16 mcg into the lungs in the morning, at noon, in the evening, and at bedtime., Disp: 112 each, Rfl: 11  Observations/Objective: Patient is well-developed, well-nourished in no acute distress.  Resting comfortably at home.  Head is normocephalic, atraumatic.  No labored breathing. Speech is clear and coherent with logical content.  Patient is alert and oriented at baseline.   Assessment and  Plan: 1. COPD exacerbation (HCC) - doxycycline (VIBRA-TABS) 100 MG tablet; Take 1 tablet (100 mg total) by mouth 2 (two) times daily.  Dispense: 20 tablet; Refill: 0 - benzonatate (TESSALON) 100 MG capsule; Take 1 capsule (100 mg total) by mouth 3 (three) times daily as needed for cough.  Dispense: 30 capsule; Refill: 0 - predniSONE (DELTASONE) 20 MG tablet; Take 2 tablets (40 mg total) by mouth daily with breakfast.  Dispense: 10 tablet; Refill: 0  With bronchitis. O2 saturations are good. Afebrile. No alarm signs or symptoms. Start prednisone burst in addition to her regular COPD medications. Tessalon and Doxycycline per orders. Strict ER precautions reviewed.   Follow Up Instructions: I discussed the assessment and treatment plan with the patient. The patient was provided an opportunity to ask questions and all were answered. The patient agreed with the plan and demonstrated an understanding of the instructions.  A copy of instructions were sent to  the patient via MyChart unless otherwise noted below.   The patient was advised to call back or seek an in-person evaluation if the symptoms worsen or if the condition fails to improve as anticipated.  Time:  I spent 10 minutes with the patient via telehealth technology discussing the above problems/concerns.    Erin Climes, PA-C

## 2022-07-09 NOTE — Patient Instructions (Signed)
Virgilio Belling, thank you for joining [name], PA-C for today's virtual visit.  While this provider is not your primary care provider (PCP), if your PCP is located in our provider database this encounter information will be shared with them immediately following your visit.   A [facility] MyChart account gives you access to today's visit and all your visits, tests, and labs performed at [facility] " click here if you don't have a [facility] MyChart account or go to [email or web address]  Consent: (Patient) Erin Zavala provided verbal consent for this virtual visit at the beginning of the encounter.  Current Medications:  Current Outpatient Medications:    azelastine (OPTIVAR) 0.05 % ophthalmic solution, Place 1 drop into both eyes 2 (two) times daily., Disp: 6 mL, Rfl: 0   benzonatate (TESSALON) 100 MG capsule, Take 1 capsule (100 mg total) by mouth 3 (three) times daily as needed for cough., Disp: 30 capsule, Rfl: 0   doxycycline (VIBRA-TABS) 100 MG tablet, Take 1 tablet (100 mg total) by mouth 2 (two) times daily., Disp: 20 tablet, Rfl: 0   ezetimibe (ZETIA) 10 MG tablet, Take 1 tablet (10 mg total) by mouth daily., Disp: 30 tablet, Rfl: 11   predniSONE (DELTASONE) 20 MG tablet, Take 2 tablets (40 mg total) by mouth daily with breakfast., Disp: 10 tablet, Rfl: 0   ADCIRCA 20 MG tablet, Take 2 tablets (40 mg total) by mouth daily., Disp: 60 tablet, Rfl: 11   albuterol (VENTOLIN HFA) 108 (90 Base) MCG/ACT inhaler, INHALE 2 PUFFS INTO THE LUNGS EVERY 4 HOURS AS NEEDED FOR WHEEZE OR FOR SHORTNESS OF BREATH, Disp: 18 each, Rfl: 2   aspirin 81 MG tablet, Take 1 tablet (81 mg total) by mouth daily., Disp: 30 tablet, Rfl: 0   atorvastatin (LIPITOR) 80 MG tablet, TAKE 1 TABLET EVERY DAY, Disp: 90 tablet, Rfl: 3   cetirizine (ZYRTEC) 10 MG tablet, TAKE 1 TABLET EVERY DAY, Disp: 90 tablet, Rfl: 3   esomeprazole (NEXIUM) 40 MG capsule, TAKE 1 CAPSULE EVERY DAY, Disp: 45  capsule, Rfl: 3   ferrous sulfate 325 (65 FE) MG tablet, TAKE 1 TABLET (325 MG TOTAL) BY MOUTH 2 (TWO) TIMES DAILY WITH A MEAL., Disp: 60 tablet, Rfl: 5   fluticasone furoate-vilanterol (BREO ELLIPTA) 200-25 MCG/ACT AEPB, INHALE 1 PUFF INTO THE LUNGS DAILY., Disp: 180 each, Rfl: 0   guaiFENesin (MUCINEX) 600 MG 12 hr tablet, Take 600 mg by mouth 2 (two) times daily as needed., Disp: , Rfl:    ipratropium-albuterol (DUONEB) 0.5-2.5 (3) MG/3ML SOLN, TAKE 3 MLS BY NEBULIZATION EVERY 6 (SIX) HOURS AS NEEDED., Disp: 360 mL, Rfl: 10   OPSUMIT 10 MG tablet, Take 1 tablet (10 mg total) by mouth daily., Disp: 30 tablet, Rfl: 11   OXYGEN, 2-3 lpm 24/7  AHC, Disp: , Rfl:    Treprostinil (TYVASO DPI TITRATION KIT) 16 & 32 & 48 MCG POWD, Inhale 16 mcg into the lungs in the morning, at noon, in the evening, and at bedtime., Disp: 112 each, Rfl: 11   Medications ordered in this encounter:  Meds ordered this encounter  Medications   doxycycline (VIBRA-TABS) 100 MG tablet    Sig: Take 1 tablet (100 mg total) by mouth 2 (two) times daily.    Dispense:  20 tablet    Refill:  0    Order Specific Question:   Supervising Provider    Answer:   Merrilee Jansky [[id number]]   benzonatate (TESSALON) 100 MG capsule  Sig: Take 1 capsule (100 mg total) by mouth 3 (three) times daily as needed for cough.    Dispense:  30 capsule    Refill:  0    Order Specific Question:   Supervising Provider    Answer:   Merrilee Jansky [[id number]]   predniSONE (DELTASONE) 20 MG tablet    Sig: Take 2 tablets (40 mg total) by mouth daily with breakfast.    Dispense:  10 tablet    Refill:  0    Order Specific Question:   Supervising Provider    Answer:   Merrilee Jansky [[id number]]   azelastine (OPTIVAR) 0.05 % ophthalmic solution    Sig: Place 1 drop into both eyes 2 (two) times daily.    Dispense:  6 mL    Refill:  0    Order Specific Question:   Supervising Provider    Answer:   Merrilee Jansky [[id number]]     *If you  need refills on other medications prior to your next appointment, please contact your pharmacy*  Follow-Up: Call back or seek an in-person evaluation if the symptoms worsen or if the condition fails to improve as anticipated.   Virtual Care [phone]  Other Instructions Take antibiotic (Doxycycline) as directed.  Increase fluids.  Get plenty of rest. Use Mucinex for congestion. Use the Tessalon and Prednisone as directed. Continue your regular COPD medications. Take a daily probiotic (I recommend Align or Culturelle, but even Activia Yogurt may be beneficial).  A humidifier placed in the bedroom may offer some relief for a dry, scratchy throat of nasal irritation.  Read information below on acute bronchitis. If anything worsens, you need an in-person evaluation ASAP.  Acute Bronchitis Bronchitis is when the airways that extend from the windpipe into the lungs get red, puffy, and painful (inflamed). Bronchitis often causes thick spit (mucus) to develop. This leads to a cough. A cough is the most common symptom of bronchitis. In acute bronchitis, the condition usually begins suddenly and goes away over time (usually in 2 weeks). Smoking, allergies, and asthma can make bronchitis worse. Repeated episodes of bronchitis may cause more lung problems.  HOME CARE Rest. Drink enough fluids to keep your pee (urine) clear or pale yellow (unless you need to limit fluids as told by your doctor). Only take over-the-counter or prescription medicines as told by your doctor. Avoid smoking and secondhand smoke. These can make bronchitis worse. If you are a smoker, think about using nicotine gum or skin patches. Quitting smoking will help your lungs heal faster. Reduce the chance of getting bronchitis again by: Washing your hands often. Avoiding people with cold symptoms. Trying not to touch your hands to your mouth, nose, or eyes. Follow up with your doctor as told.  GET HELP IF: Your symptoms  do not improve after 1 week of treatment. Symptoms include: Cough. Fever. Coughing up thick spit. Body aches. Chest congestion. Chills. Shortness of breath. Sore throat.  GET HELP RIGHT AWAY IF:  You have an increased fever. You have chills. You have severe shortness of breath. You have bloody thick spit (sputum). You throw up (vomit) often. You lose too much body fluid (dehydration). You have a severe headache. You faint.  MAKE SURE YOU:  Understand these instructions. Will watch your condition. Will get help right away if you are not doing well or get worse. Document Released: 08/15/2007 Document Revised: 10/29/2012 Document Reviewed: 08/19/2012 [facility] Patient Information 2015 [place], [place]. This information is not  intended to replace advice given to you by your health care provider. Make sure you discuss any questions you have with your health care provider.    If you have been instructed to have an in-person evaluation today at a local Urgent Care facility, please use the link below. It will take you to a list of all of our available [facility], including address, phone number and hours of operation. Please do not delay care.  [facility]  If you or a family member do not have a primary care provider, use the link below to schedule a visit and establish care. When you choose a [facility] primary care physician or advanced practice provider, you gain a long-term partner in health. Find a Primary Care Provider  Learn more about [facility]'s in-office and virtual care options: [facility] - Get Care Now

## 2022-07-23 ENCOUNTER — Other Ambulatory Visit (HOSPITAL_COMMUNITY): Payer: Self-pay | Admitting: Cardiology

## 2022-07-23 ENCOUNTER — Other Ambulatory Visit (HOSPITAL_COMMUNITY): Payer: Self-pay

## 2022-07-24 ENCOUNTER — Other Ambulatory Visit (HOSPITAL_COMMUNITY): Payer: Self-pay

## 2022-07-24 MED ORDER — EZETIMIBE 10 MG PO TABS: 10.0000 mg | ORAL_TABLET | Freq: Every day | ORAL | 3 refills | Status: DC

## 2022-07-28 DIAGNOSIS — D869 Sarcoidosis, unspecified: Secondary | ICD-10-CM | POA: Diagnosis not present

## 2022-07-28 DIAGNOSIS — J45909 Unspecified asthma, uncomplicated: Secondary | ICD-10-CM | POA: Diagnosis not present

## 2022-07-28 DIAGNOSIS — J9602 Acute respiratory failure with hypercapnia: Secondary | ICD-10-CM | POA: Diagnosis not present

## 2022-08-10 ENCOUNTER — Other Ambulatory Visit (HOSPITAL_COMMUNITY): Payer: Self-pay

## 2022-08-13 ENCOUNTER — Ambulatory Visit (HOSPITAL_COMMUNITY)
Admission: RE | Admit: 2022-08-13 | Discharge: 2022-08-13 | Disposition: A | Discharge status: Home / Self Care | Payer: BC Managed Care – PPO | Source: Ambulatory Visit | Attending: Cardiology | Admitting: Cardiology

## 2022-08-13 DIAGNOSIS — I5022 Chronic systolic (congestive) heart failure: Secondary | ICD-10-CM | POA: Insufficient documentation

## 2022-08-13 LAB — LIPID PANEL
Cholesterol: 135 mg/dL (ref 0–200)
HDL: 50 mg/dL (ref >40)
LDL Cholesterol: 72 mg/dL (ref 0–99)
Total CHOL/HDL Ratio: 2.7 RATIO
Triglycerides: 64 mg/dL (ref <150)
VLDL: 13 mg/dL (ref 0–40)

## 2022-08-27 ENCOUNTER — Other Ambulatory Visit: Payer: Self-pay | Admitting: Emergency Medicine

## 2022-08-28 DIAGNOSIS — J45909 Unspecified asthma, uncomplicated: Secondary | ICD-10-CM | POA: Diagnosis not present

## 2022-08-28 DIAGNOSIS — D869 Sarcoidosis, unspecified: Secondary | ICD-10-CM | POA: Diagnosis not present

## 2022-08-28 DIAGNOSIS — J9602 Acute respiratory failure with hypercapnia: Secondary | ICD-10-CM | POA: Diagnosis not present

## 2022-09-05 ENCOUNTER — Other Ambulatory Visit: Payer: Self-pay | Admitting: Emergency Medicine

## 2022-09-27 DIAGNOSIS — J45909 Unspecified asthma, uncomplicated: Secondary | ICD-10-CM | POA: Diagnosis not present

## 2022-09-27 DIAGNOSIS — J9602 Acute respiratory failure with hypercapnia: Secondary | ICD-10-CM | POA: Diagnosis not present

## 2022-09-27 DIAGNOSIS — D869 Sarcoidosis, unspecified: Secondary | ICD-10-CM | POA: Diagnosis not present

## 2022-10-05 ENCOUNTER — Telehealth: Payer: BC Managed Care – PPO | Admitting: Nurse Practitioner

## 2022-10-05 ENCOUNTER — Telehealth: Payer: Self-pay | Admitting: Nurse Practitioner

## 2022-10-05 ENCOUNTER — Ambulatory Visit: Payer: BC Managed Care – PPO | Admitting: Acute Care

## 2022-10-05 NOTE — Telephone Encounter (Signed)
Patient would like to do a mychart video visit for today's visit with Rhunette Croft NP. Patient phone number is [phone].

## 2022-10-05 NOTE — Telephone Encounter (Signed)
Confirmed mychart visit was okay. Visit is changed, patient is aware- nothing further needed.

## 2022-10-12 ENCOUNTER — Ambulatory Visit: Payer: BC Managed Care – PPO | Admitting: Nurse Practitioner

## 2022-10-12 ENCOUNTER — Telehealth: Payer: Self-pay | Admitting: Nurse Practitioner

## 2022-10-15 ENCOUNTER — Other Ambulatory Visit: Payer: Self-pay | Admitting: Emergency Medicine

## 2022-10-15 DIAGNOSIS — J441 Chronic obstructive pulmonary disease with (acute) exacerbation: Secondary | ICD-10-CM

## 2022-10-15 DIAGNOSIS — D860 Sarcoidosis of lung: Secondary | ICD-10-CM

## 2022-10-16 NOTE — Telephone Encounter (Signed)
Not able to send refill patient needs to be seen.

## 2022-10-17 ENCOUNTER — Other Ambulatory Visit: Payer: Self-pay | Admitting: Emergency Medicine

## 2022-10-17 ENCOUNTER — Telehealth: Payer: BC Managed Care – PPO | Admitting: Physician Assistant

## 2022-10-17 DIAGNOSIS — J441 Chronic obstructive pulmonary disease with (acute) exacerbation: Secondary | ICD-10-CM

## 2022-10-17 MED ORDER — BENZONATATE 100 MG PO CAPS: 100.0000 mg | ORAL_CAPSULE | Freq: Three times a day (TID) | ORAL | 0 refills | Status: DC | PRN

## 2022-10-17 MED ORDER — DOXYCYCLINE HYCLATE 100 MG PO TABS: 100.0000 mg | ORAL_TABLET | Freq: Two times a day (BID) | ORAL | 0 refills | Status: DC

## 2022-10-17 MED ORDER — PREDNISONE 20 MG PO TABS: 40.0000 mg | ORAL_TABLET | Freq: Every day | ORAL | 0 refills | Status: DC

## 2022-10-17 NOTE — Patient Instructions (Signed)
Virgilio Belling, thank you for joining [name], PA-C for today's virtual visit.  While this provider is not your primary care provider (PCP), if your PCP is located in our provider database this encounter information will be shared with them immediately following your visit.   A [facility] MyChart account gives you access to today's visit and all your visits, tests, and labs performed at [facility] " click here if you don't have a [facility] MyChart account or go to [email or web address]  Consent: (Patient) Erin Zavala provided verbal consent for this virtual visit at the beginning of the encounter.  Current Medications:  Current Outpatient Medications:    ADCIRCA 20 MG tablet, Take 2 tablets (40 mg total) by mouth daily., Disp: 60 tablet, Rfl: 11   aspirin 81 MG tablet, Take 1 tablet (81 mg total) by mouth daily., Disp: 30 tablet, Rfl: 0   atorvastatin (LIPITOR) 80 MG tablet, TAKE 1 TABLET EVERY DAY, Disp: 90 tablet, Rfl: 4   azelastine (OPTIVAR) 0.05 % ophthalmic solution, Place 1 drop into both eyes 2 (two) times daily., Disp: 6 mL, Rfl: 0   benzonatate (TESSALON) 100 MG capsule, Take 1 capsule (100 mg total) by mouth 3 (three) times daily as needed for cough., Disp: 30 capsule, Rfl: 0   cetirizine (ZYRTEC) 10 MG tablet, TAKE 1 TABLET EVERY DAY, Disp: 90 tablet, Rfl: 3   doxycycline (VIBRA-TABS) 100 MG tablet, Take 1 tablet (100 mg total) by mouth 2 (two) times daily., Disp: 20 tablet, Rfl: 0   esomeprazole (NEXIUM) 40 MG capsule, TAKE 1 CAPSULE EVERY DAY, Disp: 45 capsule, Rfl: 3   ezetimibe (ZETIA) 10 MG tablet, Take 1 tablet (10 mg total) by mouth daily., Disp: 90 tablet, Rfl: 3   ferrous sulfate 325 (65 FE) MG tablet, TAKE 1 TABLET (325 MG TOTAL) BY MOUTH 2 (TWO) TIMES DAILY WITH A MEAL., Disp: 60 tablet, Rfl: 5   fluticasone furoate-vilanterol (BREO ELLIPTA) 200-25 MCG/ACT AEPB, USE 1 INHALATION INTO THE LUNGS DAILY, Disp: 180 each, Rfl: 0   guaiFENesin  (MUCINEX) 600 MG 12 hr tablet, Take 600 mg by mouth 2 (two) times daily as needed., Disp: , Rfl:    ipratropium-albuterol (DUONEB) 0.5-2.5 (3) MG/3ML SOLN, TAKE 3 MLS BY NEBULIZATION EVERY 6 (SIX) HOURS AS NEEDED., Disp: 360 mL, Rfl: 10   OPSUMIT 10 MG tablet, Take 1 tablet (10 mg total) by mouth daily., Disp: 30 tablet, Rfl: 11   OXYGEN, 2-3 lpm 24/7  AHC, Disp: , Rfl:    predniSONE (DELTASONE) 20 MG tablet, Take 2 tablets (40 mg total) by mouth daily with breakfast., Disp: 10 tablet, Rfl: 0   Treprostinil (TYVASO DPI TITRATION KIT) 16 & 32 & 48 MCG POWD, Inhale 16 mcg into the lungs in the morning, at noon, in the evening, and at bedtime., Disp: 112 each, Rfl: 11   VENTOLIN HFA 108 (90 Base) MCG/ACT inhaler, INHALE 2 PUFFS INTO THE LUNGS EVERY 4 HOURS AS NEEDED FOR WHEEZE OR FOR SHORTNESS OF BREATH, Disp: 18 each, Rfl: 2   Medications ordered in this encounter:  No orders of the defined types were placed in this encounter.    *If you need refills on other medications prior to your next appointment, please contact your pharmacy*  Follow-Up: Call back or seek an in-person evaluation if the symptoms worsen or if the condition fails to improve as anticipated.  [facility] [phone]  Other Instructions Take antibiotic (Doxycycline) as directed.  Increase fluids.  Get plenty of rest. Use Mucinex for congestion. Tessalon and Prednisone as directed. Take a daily probiotic (I recommend Align or Culturelle, but even Activia Yogurt may be beneficial).  A humidifier placed in the bedroom may offer some relief for a dry, scratchy throat of nasal irritation.  Read information below on acute bronchitis. Please call or return to clinic if symptoms are not improving.  Acute Bronchitis Bronchitis is when the airways that extend from the windpipe into the lungs get red, puffy, and painful (inflamed). Bronchitis often causes thick spit (mucus) to develop. This leads to a cough. A cough is the  most common symptom of bronchitis. In acute bronchitis, the condition usually begins suddenly and goes away over time (usually in 2 weeks). Smoking, allergies, and asthma can make bronchitis worse. Repeated episodes of bronchitis may cause more lung problems.  HOME CARE Rest. Drink enough fluids to keep your pee (urine) clear or pale yellow (unless you need to limit fluids as told by your doctor). Only take over-the-counter or prescription medicines as told by your doctor. Avoid smoking and secondhand smoke. These can make bronchitis worse. If you are a smoker, think about using nicotine gum or skin patches. Quitting smoking will help your lungs heal faster. Reduce the chance of getting bronchitis again by: Washing your hands often. Avoiding people with cold symptoms. Trying not to touch your hands to your mouth, nose, or eyes. Follow up with your doctor as told.  GET HELP IF: Your symptoms do not improve after 1 week of treatment. Symptoms include: Cough. Fever. Coughing up thick spit. Body aches. Chest congestion. Chills. Shortness of breath. Sore throat.  GET HELP RIGHT AWAY IF:  You have an increased fever. You have chills. You have severe shortness of breath. You have bloody thick spit (sputum). You throw up (vomit) often. You lose too much body fluid (dehydration). You have a severe headache. You faint.  MAKE SURE YOU:  Understand these instructions. Will watch your condition. Will get help right away if you are not doing well or get worse. Document Released: 08/15/2007 Document Revised: 10/29/2012 Document Reviewed: 08/19/2012 [facility] Patient Information 2015 [place], [place]. This information is not intended to replace advice given to you by your health care provider. Make sure you discuss any questions you have with your health care provider.    If you have been instructed to have an in-person evaluation today at a local Urgent Care facility, please use the link  below. It will take you to a list of all of our available [facility], including address, phone number and hours of operation. Please do not delay care.  [facility]  If you or a family member do not have a primary care provider, use the link below to schedule a visit and establish care. When you choose a [facility] primary care physician or advanced practice provider, you gain a long-term partner in health. Find a Primary Care Provider  Learn more about [facility]'s in-office and virtual care options: [facility] - Get Care Now

## 2022-10-17 NOTE — Progress Notes (Signed)
Virtual Visit Consent   Erin Zavala, you are scheduled for a virtual visit with a [facility] provider today. Just as with appointments in the office, your consent must be obtained to participate. Your consent will be active for this visit and any virtual visit you may have with one of our providers in the next 365 days. If you have a MyChart account, a copy of this consent can be sent to you electronically.  As this is a virtual visit, video technology does not allow for your provider to perform a traditional examination. This may limit your provider's ability to fully assess your condition. If your provider identifies any concerns that need to be evaluated in person or the need to arrange testing (such as labs, EKG, etc.), we will make arrangements to do so. Although advances in technology are sophisticated, we cannot ensure that it will always work on either your end or our end. If the connection with a video visit is poor, the visit may have to be switched to a telephone visit. With either a video or telephone visit, we are not always able to ensure that we have a secure connection.  By engaging in this virtual visit, you consent to the provision of healthcare and authorize for your insurance to be billed (if applicable) for the services provided during this visit. Depending on your insurance coverage, you may receive a charge related to this service.  I need to obtain your verbal consent now. Are you willing to proceed with your visit today? Erin Zavala has provided verbal consent on 10/17/2022 for a virtual visit (video or telephone). Erin Zavala, [place]  Date: 10/17/2022 5:14 PM  Virtual Visit via Video Note   I, Erin Zavala, connected with  Erin Zavala  ([id number], [birth year]) on 10/17/22 at  5:00 PM EDT by a video-enabled telemedicine application and verified that I am speaking with the correct person using two identifiers.  Location: Patient: Virtual Visit Location Patient:  Home Provider: Virtual Visit Location Provider: Home Office   I discussed the limitations of evaluation and management by telemedicine and the availability of in person appointments. The patient expressed understanding and agreed to proceed.    History of Present Illness: Erin Zavala is a 57 y.o. who identifies as a female who was assigned female at birth, and is being seen today for flare of COPD over the past week with chest tightness, wheezing, nasal congestion and rhinorrhea. Denies fever, chills. Cough has become more productive and change in coloration. Has been unable to get in with her Pulmonologist. O2 set at 2L/[place] at rest. No substantial SOB but occasional windedness with more exertion. Denies recent travel or sick contact. Has tested at home for COVID x 2 and both negative.  O2 sat at 97% on 2L/[place] BP 125/80.  HPI: HPI  Problems:  Patient Active Problem List   Diagnosis Date Noted   Acute bronchitis with COPD (HCC) 03/30/2022   Allergic rhinitis 06/16/2020   COPD (chronic obstructive pulmonary disease) (HCC) 09/18/2018   Cough 04/04/2018   Thrush 01/02/2018   Chronic respiratory failure with hypoxia (HCC) 08/19/2017   Coronary artery disease 07/23/2016   Health care maintenance 06/11/2016   Sinus congestion 03/23/2016   Hypoxemia 03/23/2016   Pulmonary hypertension (HCC) 12/28/2015   Acute bronchitis 12/28/2015   Childhood asthma    Pulmonary sarcoidosis (HCC) 11/02/2015   GERD (gastroesophageal reflux disease) 11/02/2015    Allergies: No Known Allergies Medications:  Current Outpatient Medications:  benzonatate (TESSALON) 100 MG capsule, Take 1 capsule (100 mg total) by mouth 3 (three) times daily as needed for cough., Disp: 30 capsule, Rfl: 0   doxycycline (VIBRA-TABS) 100 MG tablet, Take 1 tablet (100 mg total) by mouth 2 (two) times daily., Disp: 14 tablet, Rfl: 0   predniSONE (DELTASONE) 20 MG tablet, Take 2 tablets (40 mg total) by mouth daily with breakfast.,  Disp: 10 tablet, Rfl: 0   ADCIRCA 20 MG tablet, Take 2 tablets (40 mg total) by mouth daily., Disp: 60 tablet, Rfl: 11   aspirin 81 MG tablet, Take 1 tablet (81 mg total) by mouth daily., Disp: 30 tablet, Rfl: 0   atorvastatin (LIPITOR) 80 MG tablet, TAKE 1 TABLET EVERY DAY, Disp: 90 tablet, Rfl: 4   azelastine (OPTIVAR) 0.05 % ophthalmic solution, Place 1 drop into both eyes 2 (two) times daily., Disp: 6 mL, Rfl: 0   cetirizine (ZYRTEC) 10 MG tablet, TAKE 1 TABLET EVERY DAY, Disp: 90 tablet, Rfl: 3   esomeprazole (NEXIUM) 40 MG capsule, TAKE 1 CAPSULE EVERY DAY, Disp: 45 capsule, Rfl: 3   ezetimibe (ZETIA) 10 MG tablet, Take 1 tablet (10 mg total) by mouth daily., Disp: 90 tablet, Rfl: 3   ferrous sulfate 325 (65 FE) MG tablet, TAKE 1 TABLET (325 MG TOTAL) BY MOUTH 2 (TWO) TIMES DAILY WITH A MEAL., Disp: 60 tablet, Rfl: 5   fluticasone furoate-vilanterol (BREO ELLIPTA) 200-25 MCG/ACT AEPB, USE 1 INHALATION INTO THE LUNGS DAILY, Disp: 180 each, Rfl: 0   guaiFENesin (MUCINEX) 600 MG 12 hr tablet, Take 600 mg by mouth 2 (two) times daily as needed., Disp: , Rfl:    ipratropium-albuterol (DUONEB) 0.5-2.5 (3) MG/3ML SOLN, TAKE 3 MLS BY NEBULIZATION EVERY 6 (SIX) HOURS AS NEEDED., Disp: 360 mL, Rfl: 10   OPSUMIT 10 MG tablet, Take 1 tablet (10 mg total) by mouth daily., Disp: 30 tablet, Rfl: 11   OXYGEN, 2-3 lpm 24/7  AHC, Disp: , Rfl:    Treprostinil (TYVASO DPI TITRATION KIT) 16 & 32 & 48 MCG POWD, Inhale 16 mcg into the lungs in the morning, at noon, in the evening, and at bedtime., Disp: 112 each, Rfl: 11   VENTOLIN HFA 108 (90 Base) MCG/ACT inhaler, INHALE 2 PUFFS INTO THE LUNGS EVERY 4 HOURS AS NEEDED FOR WHEEZE OR FOR SHORTNESS OF BREATH, Disp: 18 each, Rfl: 2  Observations/Objective: Patient is well-developed, well-nourished in no acute distress.  Resting comfortably at home.  Head is normocephalic, atraumatic.  No labored breathing. Speech is clear and coherent with logical content.   Patient is alert and oriented at baseline.   Assessment and Plan: 1. COPD exacerbation (HCC) - predniSONE (DELTASONE) 20 MG tablet; Take 2 tablets (40 mg total) by mouth daily with breakfast.  Dispense: 10 tablet; Refill: 0 - benzonatate (TESSALON) 100 MG capsule; Take 1 capsule (100 mg total) by mouth 3 (three) times daily as needed for cough.  Dispense: 30 capsule; Refill: 0 - doxycycline (VIBRA-TABS) 100 MG tablet; Take 1 tablet (100 mg total) by mouth 2 (two) times daily.  Dispense: 14 tablet; Refill: 0  Supportive measures and OTC medications reviewed. O2 stable. Start Prednisone, Tessalon and Doxycycline per orders. Strict in-person follow-up discussed and ER precautions reviewed.   Follow Up Instructions: I discussed the assessment and treatment plan with the patient. The patient was provided an opportunity to ask questions and all were answered. The patient agreed with the plan and demonstrated an understanding of the instructions.  A copy  of instructions were sent to the patient via MyChart unless otherwise noted below.    The patient was advised to call back or seek an in-person evaluation if the symptoms worsen or if the condition fails to improve as anticipated.  Time:  I spent 10 minutes with the patient via telehealth technology discussing the above problems/concerns.    Erin Climes, PA-C

## 2022-10-28 DIAGNOSIS — J9602 Acute respiratory failure with hypercapnia: Secondary | ICD-10-CM | POA: Diagnosis not present

## 2022-10-28 DIAGNOSIS — D869 Sarcoidosis, unspecified: Secondary | ICD-10-CM | POA: Diagnosis not present

## 2022-10-28 DIAGNOSIS — J45909 Unspecified asthma, uncomplicated: Secondary | ICD-10-CM | POA: Diagnosis not present

## 2022-11-19 ENCOUNTER — Other Ambulatory Visit (INDEPENDENT_AMBULATORY_CARE_PROVIDER_SITE_OTHER): Payer: BC Managed Care – PPO

## 2022-11-19 ENCOUNTER — Ambulatory Visit (INDEPENDENT_AMBULATORY_CARE_PROVIDER_SITE_OTHER): Payer: BC Managed Care – PPO

## 2022-11-19 ENCOUNTER — Encounter: Payer: Self-pay | Admitting: Nurse Practitioner

## 2022-11-19 ENCOUNTER — Ambulatory Visit (INDEPENDENT_AMBULATORY_CARE_PROVIDER_SITE_OTHER): Payer: BC Managed Care – PPO | Admitting: Nurse Practitioner

## 2022-11-19 VITALS — BP 124/74 | HR 111 | Ht 60.0 in | Wt 149.4 lb

## 2022-11-19 DIAGNOSIS — J209 Acute bronchitis, unspecified: Secondary | ICD-10-CM

## 2022-11-19 DIAGNOSIS — D860 Sarcoidosis of lung: Secondary | ICD-10-CM

## 2022-11-19 DIAGNOSIS — R059 Cough, unspecified: Secondary | ICD-10-CM | POA: Diagnosis not present

## 2022-11-19 DIAGNOSIS — J440 Chronic obstructive pulmonary disease with acute lower respiratory infection: Secondary | ICD-10-CM

## 2022-11-19 DIAGNOSIS — K219 Gastro-esophageal reflux disease without esophagitis: Secondary | ICD-10-CM

## 2022-11-19 DIAGNOSIS — R0602 Shortness of breath: Secondary | ICD-10-CM | POA: Diagnosis not present

## 2022-11-19 DIAGNOSIS — I272 Pulmonary hypertension, unspecified: Secondary | ICD-10-CM

## 2022-11-19 DIAGNOSIS — J00 Acute nasopharyngitis [common cold]: Secondary | ICD-10-CM

## 2022-11-19 DIAGNOSIS — J8410 Pulmonary fibrosis, unspecified: Secondary | ICD-10-CM | POA: Diagnosis not present

## 2022-11-19 DIAGNOSIS — J984 Other disorders of lung: Secondary | ICD-10-CM | POA: Diagnosis not present

## 2022-11-19 DIAGNOSIS — J9611 Chronic respiratory failure with hypoxia: Secondary | ICD-10-CM

## 2022-11-19 LAB — CBC WITH DIFFERENTIAL/PLATELET
Basophils Absolute: 0 10*3/uL (ref 0.0–0.1)
Basophils Relative: 0.4 % (ref 0.0–3.0)
Eosinophils Absolute: 1.2 10*3/uL — ABNORMAL HIGH (ref 0.0–0.7)
Eosinophils Relative: 16.4 % — ABNORMAL HIGH (ref 0.0–5.0)
HCT: 31.7 % — ABNORMAL LOW (ref 36.0–46.0)
Hemoglobin: 9.6 g/dL — ABNORMAL LOW (ref 12.0–15.0)
Lymphocytes Relative: 17.9 % (ref 12.0–46.0)
Lymphs Abs: 1.3 10*3/uL (ref 0.7–4.0)
MCHC: 30.2 g/dL (ref 30.0–36.0)
MCV: 75.6 fl — ABNORMAL LOW (ref 78.0–100.0)
Monocytes Absolute: 0.7 10*3/uL (ref 0.1–1.0)
Monocytes Relative: 9.9 % (ref 3.0–12.0)
Neutro Abs: 4.1 10*3/uL (ref 1.4–7.7)
Neutrophils Relative %: 55.4 % (ref 43.0–77.0)
Platelets: 450 10*3/uL — ABNORMAL HIGH (ref 150.0–400.0)
RBC: 4.2 Mil/uL (ref 3.87–5.11)
RDW: 20.2 % — ABNORMAL HIGH (ref 11.5–15.5)
WBC: 7.4 10*3/uL (ref 4.0–10.5)

## 2022-11-19 LAB — BASIC METABOLIC PANEL
BUN: 5 mg/dL — ABNORMAL LOW (ref 6–23)
CO2: 32 meq/L (ref 19–32)
Calcium: 9.5 mg/dL (ref 8.4–10.5)
Chloride: 101 meq/L (ref 96–112)
Creatinine, Ser: 0.81 mg/dL (ref 0.40–1.20)
GFR: 81.01 mL/min (ref >60.00)
Glucose, Bld: 80 mg/dL (ref 70–99)
Potassium: 3.7 meq/L (ref 3.5–5.1)
Sodium: 141 meq/L (ref 135–145)

## 2022-11-19 LAB — BRAIN NATRIURETIC PEPTIDE: Pro B Natriuretic peptide (BNP): 25 pg/mL (ref 0.0–100.0)

## 2022-11-19 MED ORDER — PREDNISONE 10 MG PO TABS: ORAL_TABLET | ORAL | 0 refills | Status: DC

## 2022-11-19 MED ORDER — METHYLPREDNISOLONE ACETATE 80 MG/ML IJ SUSP: 80.0000 mg | Freq: Once | INTRAMUSCULAR | Status: AC

## 2022-11-19 MED ORDER — HYDROCODONE BIT-HOMATROP MBR 5-1.5 MG/5ML PO SOLN: 5.0000 mL | Freq: Four times a day (QID) | ORAL | 0 refills | Status: DC | PRN

## 2022-11-19 MED ORDER — ESOMEPRAZOLE MAGNESIUM 40 MG PO CPDR: 40.0000 mg | DELAYED_RELEASE_CAPSULE | Freq: Every day | ORAL | 3 refills | Status: DC

## 2022-11-19 MED ORDER — BENZONATATE 200 MG PO CAPS: 200.0000 mg | ORAL_CAPSULE | Freq: Three times a day (TID) | ORAL | 1 refills | Status: DC | PRN

## 2022-11-19 MED ORDER — PREDNISONE 2.5 MG PO TABS: 2.5000 mg | ORAL_TABLET | Freq: Every day | ORAL | 5 refills | Status: DC

## 2022-11-19 NOTE — Assessment & Plan Note (Signed)
Acute bronchitic illness in setting of pulmonary sarcoidosis and COPD. Treated 8/7 with doxycycline and steroids with improvement but symptoms quickly returned after discontinuing steroids. We will treat her with prednisone taper and depo inj 80 mg x 1. She has been covered with empiric doxycycline. Hold off on further abx unless CXR with superimposed infection or new infectious symptoms develop. She has had 3-4 exacerbations this year. Will resume daily low dose steroids after she completes taper and assess response. At some point, she ran out of this prescription and stopped it. She will continue current inhaler regimen. Cough control therapies. Action plan in place.  Patient Instructions  Continue Albuterol inhaler 2 puffs or 3 mL neb every 6 hours as needed for shortness of breath or wheezing. Notify if symptoms persist despite rescue inhaler/neb use. Use nebs at least twice daily until symptoms improve Continue zyrtec 1 tab daily Continue nexium once daily  Continue breo 1 puff daily Continue supplemental oxygen 2-3 lpm for goal >88-90% Continue Tyvaso, Opsumit, and Adcirca as prescribed by Dr. Shirlee Latch  -Prednisone taper. 4 tabs for 3 days, then 3 tabs for 3 days, 2 tabs for 3 days, then 1 tab for 3 days, then resume 2.5 mg daily. Take in AM with food.  -Use saline nasal rinse 1-2 times a day with bottled distilled water and follow with flonase 2 sprays each nostril daily  -Benzonatate 1 capsule Three times a day for cough -Hycodan cough syrup 5 mL every 8 hours as needed for severe coughing. May cause drowsiness. Do not drive after taking   Chest x ray today  Labs today   Follow up in 2-4 weeks with Dr. Delton Coombes or Philis Nettle. If symptoms do not improve or worsen, please contact office for sooner follow up or seek emergency care.

## 2022-11-19 NOTE — Progress Notes (Addendum)
@Patient  ID: Erin Zavala, female    DOB: [birth year], 57 y.o.   MRN: [id number]  Chief Complaint  Patient presents with   Follow-up    F/u on prednisone compliance, pt states she believes she has a sinus infections     Referring provider: [facility]  HPI: 57 year old female, former smoker followed for pulmonary sarcoidosis, chronic respiratory failure, COPD, and pulmonary hypertension. She is a patient of Dr. Kavin Leech and last seen in office 02/07/2022. Past medical history significant for CAD, allergic rhinitis, GERD.   02/07/2022: OV with Dr. Delton Coombes. Evidence of stage III sarcoidosis changes. Titrating down on her steroids; now on 1 mg daily. May be reasonable to wean off and see how she does. Change to every other day. Reviewed symptoms of adrenal insufficiency. Continued on Breo for management of COPD. Followed by Dr. Shirlee Latch for [facility].    03/29/2022: Erin Zavala with Erin Mulhall NP for acute virtual visit. She tells me that over the past two weeks or so, she has had a persistent cough with yellow sputum production. She had some nasal congestion and postnasal drainage as well. She did an e-visit with her PCP who prescribed azithromycin and benzonatate on 1/14. She tells me today that she is feeling a tad better, cough seems to be a little clear on the azithromycin (one dose left) but she is still coughing and it hurts at times in her chest when she does. She feels she may need a course of steroids. She denies fevers, chills, hemoptysis, wheezing, increased shortness of breath. She is using her Breo inhaler. No increased O2 requirement. She admits adherence to her PAH regimen; no increased weight gain, swelling, or orthopnea.   11/19/2022: Today - overdue follow up Patient presents today for overdue follow up but she is also having some acute symptoms. She has been sick for the last month. She did a video visit 8/7 and received doxycycline and prednisone. She felt better when she was on the steroids  but symptoms returned shortly after completing. She feels more short winded than her usual. She is coughing frequently. Occasionally produces a small amount of clear phlegm. She also has sinus congestion and drainage, which is also clear. She does notice an occasional wheeze. Denies fevers, chills, hemoptysis, leg swelling, weight gain, anorexia, CP, lightheadedness/dizziness. No visual changes or skin lesions. She did COVID test when symptoms started and this was negative. She has been off of daily prednisone. Felt better when she was on it. She also had a flare up in April 2024 requiring steroids and abx. She is using her Breo inhaler daily. Using her rescue a few times a day. She was taking tessalon but it doesn't always help with her cough. She did run out of nexium a few weeks ago too. Still taking her daily allergy pill. No increased O2 requirement. Compliant with her PH meds.   No Known Allergies  Immunization History  Administered Date(s) Administered   Influenza,inj,Quad PF,6+ Mos 02/28/2016, 12/18/2016, 01/02/2018, 11/04/2018, 12/24/2019, 12/20/2021   PPD Test 02/28/2016   Pneumococcal Conjugate-13 11/29/2016   Pneumococcal Polysaccharide-23 11/03/2015    Past Medical History:  Diagnosis Date   Asthma    as a child   CHF (congestive heart failure) (HCC)    Cough    Dyspnea    GERD (gastroesophageal reflux disease)    Migraines    Oxygen dependent    Pulmonary hypertension (HCC)    Sarcoidosis     Tobacco History: Social History  Tobacco Use  Smoking Status Former   Current packs/day: 0.00   Average packs/day: 0.3 packs/day for 24.8 years (6.2 ttl pk-yrs)   Types: Cigarettes   Start date: 82   Quit date: 01/11/2011   Years since quitting: 11.8  Smokeless Tobacco Never   Counseling given: Not Answered   Outpatient Medications Prior to Visit  Medication Sig Dispense Refill   ADCIRCA 20 MG tablet Take 2 tablets (40 mg total) by mouth daily. 60 tablet 11   aspirin  81 MG tablet Take 1 tablet (81 mg total) by mouth daily. 30 tablet 0   atorvastatin (LIPITOR) 80 MG tablet TAKE 1 TABLET EVERY DAY 90 tablet 4   azelastine (OPTIVAR) 0.05 % ophthalmic solution Place 1 drop into both eyes 2 (two) times daily. 6 mL 0   cetirizine (ZYRTEC) 10 MG tablet TAKE 1 TABLET EVERY DAY 90 tablet 4   ezetimibe (ZETIA) 10 MG tablet Take 1 tablet (10 mg total) by mouth daily. 90 tablet 3   ferrous sulfate 325 (65 FE) MG tablet TAKE 1 TABLET (325 MG TOTAL) BY MOUTH 2 (TWO) TIMES DAILY WITH A MEAL. 60 tablet 5   fluticasone furoate-vilanterol (BREO ELLIPTA) 200-25 MCG/ACT AEPB USE 1 INHALATION INTO THE LUNGS DAILY 180 each 0   guaiFENesin (MUCINEX) 600 MG 12 hr tablet Take 600 mg by mouth 2 (two) times daily as needed.     ipratropium-albuterol (DUONEB) 0.5-2.5 (3) MG/3ML SOLN TAKE 3 MLS BY NEBULIZATION EVERY 6 (SIX) HOURS AS NEEDED. 360 mL 10   OPSUMIT 10 MG tablet Take 1 tablet (10 mg total) by mouth daily. 30 tablet 11   OXYGEN 2-3 lpm 24/7  AHC     Treprostinil (TYVASO DPI TITRATION KIT) 16 & 32 & 48 MCG POWD Inhale 16 mcg into the lungs in the morning, at noon, in the evening, and at bedtime. 112 each 11   VENTOLIN HFA 108 (90 Base) MCG/ACT inhaler INHALE 2 PUFFS INTO THE LUNGS EVERY 4 HOURS AS NEEDED FOR WHEEZE OR FOR SHORTNESS OF BREATH 18 each 2   benzonatate (TESSALON) 100 MG capsule Take 1 capsule (100 mg total) by mouth 3 (three) times daily as needed for cough. 30 capsule 0   esomeprazole (NEXIUM) 40 MG capsule TAKE 1 CAPSULE EVERY DAY 45 capsule 3   predniSONE (DELTASONE) 20 MG tablet Take 2 tablets (40 mg total) by mouth daily with breakfast. 10 tablet 0   doxycycline (VIBRA-TABS) 100 MG tablet Take 1 tablet (100 mg total) by mouth 2 (two) times daily. (Patient not taking: Reported on 11/19/2022) 14 tablet 0   No facility-administered medications prior to visit.     Review of Systems:   Constitutional: No weight loss or gain, night sweats, fevers, chills, or  lassitude. +fatigue  HEENT: No headaches, difficulty swallowing, tooth/dental problems, or sore throat. No sneezing, itching, ear ache. +nasal congestion, post nasal drip CV:  No chest pain, orthopnea, PND, swelling in lower extremities, anasarca, dizziness, palpitations, syncope Resp: +shortness of breath with exertion; wheezing; cough. No hemoptysis.  No chest wall deformity GI:  + heartburn, indigestion. No abdominal pain, nausea, vomiting, diarrhea, change in bowel habits, loss of appetite, bloody stools.  GU: No dysuria, change in color of urine, urgency or frequency.   Skin: No rash, lesions, ulcerations MSK:  No joint pain or swelling.   Neuro: No dizziness or lightheadedness.  Psych: No depression or anxiety. Mood stable.     Physical Exam:  BP 124/74   Pulse [name])  111   Ht 5' (1.524 m)   Wt 149 lb 6.4 oz (67.8 kg)   SpO2 97%   BMI 29.18 kg/m   GEN: Pleasant, interactive, chronically-ill appearing; in no acute distress. HEENT:  Normocephalic and atraumatic. PERRLA. Sclera white. Nasal turbinates pink, moist and patent bilaterally. No rhinorrhea present. Oropharynx pink and moist, without exudate or edema. No lesions, ulcerations, or postnasal drip.  NECK:  Supple w/ fair ROM. No JVD present. Normal carotid impulses w/o bruits. Thyroid symmetrical with no goiter or nodules palpated. No lymphadenopathy.   CV: RRR, no m/r/g, no peripheral edema. Pulses intact, +2 bilaterally. No cyanosis, pallor or clubbing. PULMONARY:  Unlabored, regular breathing. Scattered rhonchi and expiratory wheezes bilaterally A&P. No accessory muscle use.  GI: BS present and normoactive. Soft, non-tender to palpation. No organomegaly or masses detected.  MSK: No erythema, warmth or tenderness. Cap refil <2 sec all extrem. No deformities or joint swelling noted.  Neuro: A/Ox3. No focal deficits noted.   Skin: Warm, no lesions or rashe Psych: Normal affect and behavior. Judgement and thought content  appropriate.     Lab Results:  CBC    Component Value Date/Time   WBC 8.5 02/17/2018 0950   RBC 4.50 02/17/2018 0950   HGB 13.0 02/17/2018 0950   HCT 40.7 02/17/2018 0950   PLT 298.0 02/17/2018 0950   MCV 90.5 02/17/2018 0950   MCH 29.0 01/13/2016 1245   MCHC 31.8 02/17/2018 0950   RDW 15.0 02/17/2018 0950   LYMPHSABS 1.1 09/26/2016 1707   MONOABS 0.7 09/26/2016 1707   EOSABS 0.3 09/26/2016 1707   BASOSABS 0.1 09/26/2016 1707    BMET    Component Value Date/Time   NA 138 05/08/2022 1554   K 4.2 05/08/2022 1554   CL 102 05/08/2022 1554   CO2 26 05/08/2022 1554   GLUCOSE 90 05/08/2022 1554   BUN 5 (L) 05/08/2022 1554   CREATININE 0.73 05/08/2022 1554   CALCIUM 9.3 05/08/2022 1554   GFRNONAA >60 05/08/2022 1554   GFRAA >60 03/16/2019 1505    BNP    Component Value Date/Time   BNP 30.7 05/08/2022 1554     Imaging:  No results found.  Administration History     None          Latest Ref Rng & Units 02/07/2022    9:59 AM 06/26/2017   10:01 AM 11/28/2016   11:00 AM 08/23/2016   11:58 AM 12/27/2015   10:46 AM  PFT Results  FVC-Pre L 1.75  1.61  1.69  1.70  1.53   FVC-Predicted Pre % 59  66  68  69  61   FVC-Post L 1.76  1.67    1.56   FVC-Predicted Post % 59  68    63   Pre FEV1/FVC % % 82  84  87  87  79   Post FEV1/FCV % % 81  87    74   FEV1-Pre L 1.44  1.35  1.46  1.49  1.21   FEV1-Predicted Pre % 62  69  74  75  60   FEV1-Post L 1.43  1.46    1.16   DLCO uncorrected ml/min/mmHg 5.32  6.52  5.26  5.96  3.85   DLCO UNC% % 29  34  27  31  20    DLCO corrected ml/min/mmHg 5.32   5.23  5.70    DLCO COR %Predicted % 29   27  30     DLVA Predicted % 47  59  51  54  47   TLC L 2.51  2.80    2.62   TLC % Predicted % 56  62    58   RV % Predicted % 48  78    70     No results found for: "NITRICOXIDE"      Assessment & Plan:   Acute bronchitis with COPD (HCC) Acute bronchitic illness in setting of pulmonary sarcoidosis and COPD. Treated 8/7  with doxycycline and steroids with improvement but symptoms quickly returned after discontinuing steroids. We will treat her with prednisone taper and depo inj 80 mg x 1. She has been covered with empiric doxycycline. Hold off on further abx unless CXR with superimposed infection or new infectious symptoms develop. She has had 3-4 exacerbations this year. Will resume daily low dose steroids after she completes taper and assess response. At some point, she ran out of this prescription and stopped it. She will continue current inhaler regimen. Cough control therapies. Action plan in place.  Patient Instructions  Continue Albuterol inhaler 2 puffs or 3 mL neb every 6 hours as needed for shortness of breath or wheezing. Notify if symptoms persist despite rescue inhaler/neb use. Use nebs at least twice daily until symptoms improve Continue zyrtec 1 tab daily Continue nexium once daily  Continue breo 1 puff daily Continue supplemental oxygen 2-3 lpm for goal >88-90% Continue Tyvaso, Opsumit, and Adcirca as prescribed by Dr. Shirlee Latch  -Prednisone taper. 4 tabs for 3 days, then 3 tabs for 3 days, 2 tabs for 3 days, then 1 tab for 3 days, then resume 2.5 mg daily. Take in AM with food.  -Use saline nasal rinse 1-2 times a day with bottled distilled water and follow with flonase 2 sprays each nostril daily  -Benzonatate 1 capsule Three times a day for cough -Hycodan cough syrup 5 mL every 8 hours as needed for severe coughing. May cause drowsiness. Do not drive after taking   Chest x ray today  Labs today   Follow up in 2-4 weeks with Dr. Delton Coombes or Philis Nettle. If symptoms do not improve or worsen, please contact office for sooner follow up or seek emergency care.    Pulmonary sarcoidosis (HCC) Confirmed via biopsy. No systemic symptoms. See above  Chronic respiratory failure with hypoxia (HCC) Stable without increased O2 requirement. Goal >88-90%  Pulmonary hypertension (HCC) Euvolemic. Will check  CXR to rule out volume overload, BNP, BMET. See above. Follow up with Dr. Shirlee Latch.   Acute rhinitis Increased nasal congestion. Likely viral. No significant change with doxycycline. Recommend supportive care measures.    I spent 42 minutes of dedicated to the care of this patient on the date of this encounter to include pre-visit review of records, face-to-face time with the patient discussing conditions above, post visit ordering of testing, clinical documentation with the electronic health record, making appropriate referrals as documented, and communicating necessary findings to members of the patients care team.  Noemi Chapel, NP 11/19/2022  Pt aware and understands NP's role.

## 2022-11-19 NOTE — Patient Instructions (Addendum)
Continue Albuterol inhaler 2 puffs or 3 mL neb every 6 hours as needed for shortness of breath or wheezing. Notify if symptoms persist despite rescue inhaler/neb use. Use nebs at least twice daily until symptoms improve Continue zyrtec 1 tab daily Continue nexium once daily  Continue breo 1 puff daily Continue supplemental oxygen 2-3 lpm for goal >88-90% Continue Tyvaso, Opsumit, and Adcirca as prescribed by Dr. Shirlee Latch  -Prednisone taper. 4 tabs for 3 days, then 3 tabs for 3 days, 2 tabs for 3 days, then 1 tab for 3 days, then resume 2.5 mg daily. Take in AM with food.  -Use saline nasal rinse 1-2 times a day with bottled distilled water and follow with flonase 2 sprays each nostril daily  -Benzonatate 1 capsule Three times a day for cough -Hycodan cough syrup 5 mL every 8 hours as needed for severe coughing. May cause drowsiness. Do not drive after taking   Chest x ray today  Labs today   Follow up in 2-4 weeks with Dr. Delton Coombes or Philis Nettle. If symptoms do not improve or worsen, please contact office for sooner follow up or seek emergency care.

## 2022-11-19 NOTE — Assessment & Plan Note (Addendum)
Confirmed via biopsy. No systemic symptoms. See above

## 2022-11-19 NOTE — Assessment & Plan Note (Signed)
Stable without increased O2 requirement. Goal >88-90% 

## 2022-11-19 NOTE — Assessment & Plan Note (Signed)
Increased nasal congestion. Likely viral. No significant change with doxycycline. Recommend supportive care measures.

## 2022-11-19 NOTE — Assessment & Plan Note (Signed)
Euvolemic. Will check CXR to rule out volume overload, BNP, BMET. See above. Follow up with Dr. Shirlee Latch.

## 2022-11-20 ENCOUNTER — Telehealth: Payer: Self-pay | Admitting: Student

## 2022-11-20 NOTE — Telephone Encounter (Signed)
[facility] on [address] [employer] from [phone].  They got a fax from Ms.Cobb they can not read. I looked at AVS and referral tabs but could not tell her what it was for. PT just seen yesterday. Please refax.

## 2022-11-22 MED ORDER — AMOXICILLIN-POT CLAVULANATE 875-125 MG PO TABS: 1.0000 | ORAL_TABLET | Freq: Two times a day (BID) | ORAL | 0 refills | Status: AC

## 2022-11-22 NOTE — Progress Notes (Deleted)
11/22/2022: Unresolved symptoms despite recent prednisone and doxycycline courses. She has increased airspace disease in the lower lobes. Concerning for possible pneumonia given symptoms. Will treat her with augmentin course x 7 days. Pt made aware. Medication education provided. We also discussed her anemia. She's not taking her iron pills. Will follow up with her PCP. Elevated eosinophils suggesting an allergic component. Will need repeat CXR at follow up and recommend rechecking at follow up. Understands ED precautions.

## 2022-11-22 NOTE — Progress Notes (Signed)
Unresolved symptoms despite recent prednisone and doxycycline courses. She has increased airspace disease in the lower lobes. Concerning for possible pneumonia given symptoms. Will treat her with augmentin course x 7 days. Pt made aware. Medication education provided. We also discussed her anemia. She's not taking her iron pills. Will follow up with her PCP. Elevated eosinophils suggesting an allergic component. Will need repeat CXR at follow up and recommend rechecking at follow up. Understands ED precautions. NFN.

## 2022-11-23 NOTE — Telephone Encounter (Signed)
Routing to [employer]. Looks like order was placed for neuro rehab.

## 2022-11-23 NOTE — Telephone Encounter (Signed)
Order was not placed properly so there is no info in the order for the referring doctor to see why it needs to be done

## 2022-11-27 NOTE — Telephone Encounter (Signed)
I have cancelled the order.

## 2022-11-27 NOTE — Telephone Encounter (Signed)
I didn't put in a referral to neuro rehab. I'm not sure who placed the referral but it was not our office. Would recommend they reach out to her PCP.

## 2022-11-28 DIAGNOSIS — J9602 Acute respiratory failure with hypercapnia: Secondary | ICD-10-CM | POA: Diagnosis not present

## 2022-11-28 DIAGNOSIS — J45909 Unspecified asthma, uncomplicated: Secondary | ICD-10-CM | POA: Diagnosis not present

## 2022-11-28 DIAGNOSIS — D869 Sarcoidosis, unspecified: Secondary | ICD-10-CM | POA: Diagnosis not present

## 2022-12-28 DIAGNOSIS — J9602 Acute respiratory failure with hypercapnia: Secondary | ICD-10-CM | POA: Diagnosis not present

## 2022-12-28 DIAGNOSIS — D869 Sarcoidosis, unspecified: Secondary | ICD-10-CM | POA: Diagnosis not present

## 2022-12-28 DIAGNOSIS — J45909 Unspecified asthma, uncomplicated: Secondary | ICD-10-CM | POA: Diagnosis not present

## 2023-01-03 ENCOUNTER — Ambulatory Visit: Payer: BC Managed Care – PPO | Admitting: Nurse Practitioner

## 2023-01-08 ENCOUNTER — Encounter: Payer: Self-pay | Admitting: Nurse Practitioner

## 2023-01-21 ENCOUNTER — Telehealth: Payer: BC Managed Care – PPO | Admitting: Emergency Medicine

## 2023-01-21 DIAGNOSIS — J440 Chronic obstructive pulmonary disease with acute lower respiratory infection: Secondary | ICD-10-CM | POA: Diagnosis not present

## 2023-01-21 DIAGNOSIS — J209 Acute bronchitis, unspecified: Secondary | ICD-10-CM

## 2023-01-21 MED ORDER — PREDNISONE 10 MG PO TABS: ORAL_TABLET | ORAL | 0 refills | Status: DC

## 2023-01-21 MED ORDER — AZITHROMYCIN 250 MG PO TABS: ORAL_TABLET | ORAL | 0 refills | Status: AC

## 2023-01-21 NOTE — Progress Notes (Signed)
Virtual Visit Consent   Erin Zavala, you are scheduled for a virtual visit with a [place] provider today. Just as with appointments in the office, your consent must be obtained to participate. Your consent will be active for this visit and any virtual visit you may have with one of our providers in the next 365 days. If you have a MyChart account, a copy of this consent can be sent to you electronically.  As this is a virtual visit, video technology does not allow for your provider to perform a traditional examination. This may limit your provider's ability to fully assess your condition. If your provider identifies any concerns that need to be evaluated in person or the need to arrange testing (such as labs, EKG, etc.), we will make arrangements to do so. Although advances in technology are sophisticated, we cannot ensure that it will always work on either your end or our end. If the connection with a video visit is poor, the visit may have to be switched to a telephone visit. With either a video or telephone visit, we are not always able to ensure that we have a secure connection.  By engaging in this virtual visit, you consent to the provision of healthcare and authorize for your insurance to be billed (if applicable) for the services provided during this visit. Depending on your insurance coverage, you may receive a charge related to this service.  I need to obtain your verbal consent now. Are you willing to proceed with your visit today? Erin Zavala has provided verbal consent on 01/21/2023 for a virtual visit (video or telephone). Cathlyn Parsons, NP  Date: 01/21/2023 8:07 AM  Virtual Visit via Video Note   I, Cathlyn Parsons, connected with  Erin Zavala  ([id number], [birth year]) on 01/21/23 at  8:00 AM EST by a video-enabled telemedicine application and verified that I am speaking with the correct person using two identifiers.  Location: Patient: Virtual Visit Location Patient:  Home Provider: Virtual Visit Location Provider: Home Office   I discussed the limitations of evaluation and management by telemedicine and the availability of in person appointments. The patient expressed understanding and agreed to proceed.    History of Present Illness: Erin Zavala is a 57 y.o. who identifies as a female who was assigned female at birth, and is being seen today for SOB, wheezing. Has sarcoidosis and chronic respiratory failure. Reports she gets flares of respiratory symptoms when weather changes. Has had the "sniffles" since maybe 01/18/23. Felt otherwise ok until 1am this morning when she woke up SOB. Used her albuterol nebulizer at that time. Has used it twice since. Turned up her [place] O2 from 2L to 4L. Reports her O2 sats are 94% right now and her BP is "good". No fever. Has not been around anyone in several days and has not left her house so does not think she could have caught covid. She requests a zpak and prednisone taper which is what she feels helps her best when these flares happen. Is not using flonase or saline irrigation at this time. Does not feel she needs emergent care at this time but lives near the hospital if she decides she needs to go  HPI: HPI  Problems:  Patient Active Problem List   Diagnosis Date Noted   Acute bronchitis with COPD (HCC) 03/30/2022   Acute rhinitis 06/16/2020   COPD (chronic obstructive pulmonary disease) (HCC) 09/18/2018   Cough 04/04/2018   Thrush 01/02/2018   Chronic respiratory  failure with hypoxia (HCC) 08/19/2017   Coronary artery disease 07/23/2016   Health care maintenance 06/11/2016   Sinus congestion 03/23/2016   Hypoxemia 03/23/2016   Pulmonary hypertension (HCC) 12/28/2015   Acute bronchitis 12/28/2015   Childhood asthma    Pulmonary sarcoidosis (HCC) 11/02/2015   GERD (gastroesophageal reflux disease) 11/02/2015    Allergies: No Known Allergies Medications:  Current Outpatient Medications:    ADCIRCA 20 MG tablet,  Take 2 tablets (40 mg total) by mouth daily., Disp: 60 tablet, Rfl: 11   aspirin 81 MG tablet, Take 1 tablet (81 mg total) by mouth daily., Disp: 30 tablet, Rfl: 0   atorvastatin (LIPITOR) 80 MG tablet, TAKE 1 TABLET EVERY DAY, Disp: 90 tablet, Rfl: 4   azelastine (OPTIVAR) 0.05 % ophthalmic solution, Place 1 drop into both eyes 2 (two) times daily., Disp: 6 mL, Rfl: 0   azithromycin (ZITHROMAX) 250 MG tablet, Take 2 tablets on day 1, then 1 tablet daily on days 2 through 5, Disp: 6 tablet, Rfl: 0   benzonatate (TESSALON) 200 MG capsule, Take 1 capsule (200 mg total) by mouth 3 (three) times daily as needed for cough., Disp: 30 capsule, Rfl: 1   cetirizine (ZYRTEC) 10 MG tablet, TAKE 1 TABLET EVERY DAY, Disp: 90 tablet, Rfl: 4   esomeprazole (NEXIUM) 40 MG capsule, Take 1 capsule (40 mg total) by mouth daily., Disp: 90 capsule, Rfl: 3   ezetimibe (ZETIA) 10 MG tablet, Take 1 tablet (10 mg total) by mouth daily., Disp: 90 tablet, Rfl: 3   ferrous sulfate 325 (65 FE) MG tablet, TAKE 1 TABLET (325 MG TOTAL) BY MOUTH 2 (TWO) TIMES DAILY WITH A MEAL., Disp: 60 tablet, Rfl: 5   fluticasone furoate-vilanterol (BREO ELLIPTA) 200-25 MCG/ACT AEPB, USE 1 INHALATION INTO THE LUNGS DAILY, Disp: 180 each, Rfl: 0   guaiFENesin (MUCINEX) 600 MG 12 hr tablet, Take 600 mg by mouth 2 (two) times daily as needed., Disp: , Rfl:    HYDROcodone bit-homatropine (HYCODAN) 5-1.5 MG/5ML syrup, Take 5 mLs by mouth every 6 (six) hours as needed for cough., Disp: 120 mL, Rfl: 0   ipratropium-albuterol (DUONEB) 0.5-2.5 (3) MG/3ML SOLN, TAKE 3 MLS BY NEBULIZATION EVERY 6 (SIX) HOURS AS NEEDED., Disp: 360 mL, Rfl: 10   OPSUMIT 10 MG tablet, Take 1 tablet (10 mg total) by mouth daily., Disp: 30 tablet, Rfl: 11   OXYGEN, 2-3 lpm 24/7  AHC, Disp: , Rfl:    predniSONE (DELTASONE) 10 MG tablet, 4 tabs for 3 days, then 3 tabs for 3 days, 2 tabs for 3 days, then 1 tab for 3 days, then stop, Disp: 30 tablet, Rfl: 0   predniSONE (DELTASONE)  2.5 MG tablet, Take 1 tablet (2.5 mg total) by mouth daily with breakfast., Disp: 30 tablet, Rfl: 5   Treprostinil (TYVASO DPI TITRATION KIT) 16 & 32 & 48 MCG POWD, Inhale 16 mcg into the lungs in the morning, at noon, in the evening, and at bedtime., Disp: 112 each, Rfl: 11   VENTOLIN HFA 108 (90 Base) MCG/ACT inhaler, INHALE 2 PUFFS INTO THE LUNGS EVERY 4 HOURS AS NEEDED FOR WHEEZE OR FOR SHORTNESS OF BREATH, Disp: 18 each, Rfl: 2  Current Facility-Administered Medications:    methylPREDNISolone acetate (DEPO-MEDROL) injection 80 mg, 80 mg, Intramuscular, Once, Cobb, Ruby Cola, NP  Observations/Objective: Patient is well-developed, well-nourished in no acute distress.  Resting comfortably  at home.  Head is normocephalic, atraumatic.  She is very short of breath and speakign in short  sentences Speech is clear and coherent with logical content.  Patient is alert and oriented at baseline.    Assessment and Plan: 1. Acute bronchitis with COPD (HCC) - predniSONE (DELTASONE) 10 MG tablet; 4 tabs for 3 days, then 3 tabs for 3 days, 2 tabs for 3 days, then 1 tab for 3 days, then stop  Dispense: 30 tablet; Refill: 0  Rx zpak and prednisone taper as requested. Discussed plan for emergency care should this treatment plan not work; pt knows to seek hospital care if needed  Follow Up Instructions: I discussed the assessment and treatment plan with the patient. The patient was provided an opportunity to ask questions and all were answered. The patient agreed with the plan and demonstrated an understanding of the instructions.  A copy of instructions were sent to the patient via MyChart unless otherwise noted below.  The patient was advised to call back or seek an in-person evaluation if the symptoms worsen or if the condition fails to improve as anticipated.    Cathlyn Parsons, NP

## 2023-01-21 NOTE — Patient Instructions (Signed)
Erin Zavala, thank you for joining Cathlyn Parsons, NP for today's virtual visit.  While this provider is not your primary care provider (PCP), if your PCP is located in our provider database this encounter information will be shared with them immediately following your visit.   A [facility] MyChart account gives you access to today's visit and all your visits, tests, and labs performed at [facility] " click here if you don't have a  MyChart account or go to [email or web address]  Consent: (Patient) Erin Zavala provided verbal consent for this virtual visit at the beginning of the encounter.  Current Medications:  Current Outpatient Medications:    azithromycin (ZITHROMAX) 250 MG tablet, Take 2 tablets on day 1, then 1 tablet daily on days 2 through 5, Disp: 6 tablet, Rfl: 0   ADCIRCA 20 MG tablet, Take 2 tablets (40 mg total) by mouth daily., Disp: 60 tablet, Rfl: 11   aspirin 81 MG tablet, Take 1 tablet (81 mg total) by mouth daily., Disp: 30 tablet, Rfl: 0   atorvastatin (LIPITOR) 80 MG tablet, TAKE 1 TABLET EVERY DAY, Disp: 90 tablet, Rfl: 4   azelastine (OPTIVAR) 0.05 % ophthalmic solution, Place 1 drop into both eyes 2 (two) times daily., Disp: 6 mL, Rfl: 0   benzonatate (TESSALON) 200 MG capsule, Take 1 capsule (200 mg total) by mouth 3 (three) times daily as needed for cough., Disp: 30 capsule, Rfl: 1   cetirizine (ZYRTEC) 10 MG tablet, TAKE 1 TABLET EVERY DAY, Disp: 90 tablet, Rfl: 4   esomeprazole (NEXIUM) 40 MG capsule, Take 1 capsule (40 mg total) by mouth daily., Disp: 90 capsule, Rfl: 3   ezetimibe (ZETIA) 10 MG tablet, Take 1 tablet (10 mg total) by mouth daily., Disp: 90 tablet, Rfl: 3   ferrous sulfate 325 (65 FE) MG tablet, TAKE 1 TABLET (325 MG TOTAL) BY MOUTH 2 (TWO) TIMES DAILY WITH A MEAL., Disp: 60 tablet, Rfl: 5   fluticasone furoate-vilanterol (BREO ELLIPTA) 200-25 MCG/ACT AEPB, USE 1 INHALATION INTO THE LUNGS DAILY, Disp: 180 each, Rfl:  0   guaiFENesin (MUCINEX) 600 MG 12 hr tablet, Take 600 mg by mouth 2 (two) times daily as needed., Disp: , Rfl:    HYDROcodone bit-homatropine (HYCODAN) 5-1.5 MG/5ML syrup, Take 5 mLs by mouth every 6 (six) hours as needed for cough., Disp: 120 mL, Rfl: 0   ipratropium-albuterol (DUONEB) 0.5-2.5 (3) MG/3ML SOLN, TAKE 3 MLS BY NEBULIZATION EVERY 6 (SIX) HOURS AS NEEDED., Disp: 360 mL, Rfl: 10   OPSUMIT 10 MG tablet, Take 1 tablet (10 mg total) by mouth daily., Disp: 30 tablet, Rfl: 11   OXYGEN, 2-3 lpm 24/7  AHC, Disp: , Rfl:    predniSONE (DELTASONE) 10 MG tablet, 4 tabs for 3 days, then 3 tabs for 3 days, 2 tabs for 3 days, then 1 tab for 3 days, then stop, Disp: 30 tablet, Rfl: 0   predniSONE (DELTASONE) 2.5 MG tablet, Take 1 tablet (2.5 mg total) by mouth daily with breakfast., Disp: 30 tablet, Rfl: 5   Treprostinil (TYVASO DPI TITRATION KIT) 16 & 32 & 48 MCG POWD, Inhale 16 mcg into the lungs in the morning, at noon, in the evening, and at bedtime., Disp: 112 each, Rfl: 11   VENTOLIN HFA 108 (90 Base) MCG/ACT inhaler, INHALE 2 PUFFS INTO THE LUNGS EVERY 4 HOURS AS NEEDED FOR WHEEZE OR FOR SHORTNESS OF BREATH, Disp: 18 each, Rfl: 2  Current Facility-Administered Medications:    methylPREDNISolone acetate (DEPO-MEDROL)  injection 80 mg, 80 mg, Intramuscular, Once, Cobb, Ruby Cola, NP   Medications ordered in this encounter:  Meds ordered this encounter  Medications   predniSONE (DELTASONE) 10 MG tablet    Sig: 4 tabs for 3 days, then 3 tabs for 3 days, 2 tabs for 3 days, then 1 tab for 3 days, then stop    Dispense:  30 tablet    Refill:  0   azithromycin (ZITHROMAX) 250 MG tablet    Sig: Take 2 tablets on day 1, then 1 tablet daily on days 2 through 5    Dispense:  6 tablet    Refill:  0     *If you need refills on other medications prior to your next appointment, please contact your pharmacy*  Follow-Up: Call back or seek an in-person evaluation if the symptoms worsen or if the  condition fails to improve as anticipated.  [facility] [phone]  Other Instructions Please call 911 or seek emergency care if your shortness of breath is worsening instead of getting better. Please follow up with pulmonology. Since you have the sniffles, I recommend you resume your nasal saline rinses with distilled water and resume using flonase.    If you have been instructed to have an in-person evaluation today at a local Urgent Care facility, please use the link below. It will take you to a list of all of our available [facility], including address, phone number and hours of operation. Please do not delay care.  [facility]  If you or a family member do not have a primary care provider, use the link below to schedule a visit and establish care. When you choose a [facility] primary care physician or advanced practice provider, you gain a long-term partner in health. Find a Primary Care Provider  Learn more about [place] [facility]'s in-office and virtual care options: [place] [facility] - Get Care Now

## 2023-01-28 DIAGNOSIS — J45909 Unspecified asthma, uncomplicated: Secondary | ICD-10-CM | POA: Diagnosis not present

## 2023-01-28 DIAGNOSIS — D869 Sarcoidosis, unspecified: Secondary | ICD-10-CM | POA: Diagnosis not present

## 2023-01-28 DIAGNOSIS — J9602 Acute respiratory failure with hypercapnia: Secondary | ICD-10-CM | POA: Diagnosis not present

## 2023-02-04 ENCOUNTER — Other Ambulatory Visit: Payer: Self-pay | Admitting: Emergency Medicine

## 2023-02-27 DIAGNOSIS — J45909 Unspecified asthma, uncomplicated: Secondary | ICD-10-CM | POA: Diagnosis not present

## 2023-02-27 DIAGNOSIS — D869 Sarcoidosis, unspecified: Secondary | ICD-10-CM | POA: Diagnosis not present

## 2023-02-27 DIAGNOSIS — J9602 Acute respiratory failure with hypercapnia: Secondary | ICD-10-CM | POA: Diagnosis not present

## 2023-03-02 ENCOUNTER — Other Ambulatory Visit: Payer: Self-pay | Admitting: Emergency Medicine

## 2023-03-02 DIAGNOSIS — J441 Chronic obstructive pulmonary disease with (acute) exacerbation: Secondary | ICD-10-CM

## 2023-03-02 DIAGNOSIS — D860 Sarcoidosis of lung: Secondary | ICD-10-CM

## 2023-03-02 DIAGNOSIS — K219 Gastro-esophageal reflux disease without esophagitis: Secondary | ICD-10-CM

## 2023-03-22 ENCOUNTER — Telehealth: Payer: BC Managed Care – PPO | Admitting: Nurse Practitioner

## 2023-03-22 DIAGNOSIS — J441 Chronic obstructive pulmonary disease with (acute) exacerbation: Secondary | ICD-10-CM

## 2023-03-22 MED ORDER — AZITHROMYCIN 250 MG PO TABS: ORAL_TABLET | ORAL | 0 refills | Status: AC

## 2023-03-22 MED ORDER — PREDNISONE 10 MG (21) PO TBPK: ORAL_TABLET | ORAL | 0 refills | Status: DC

## 2023-03-22 MED ORDER — PROMETHAZINE-DM 6.25-15 MG/5ML PO SYRP: 5.0000 mL | ORAL_SOLUTION | Freq: Four times a day (QID) | ORAL | 0 refills | Status: DC | PRN

## 2023-03-22 NOTE — Progress Notes (Signed)
 Virtual Visit Consent   Erin Zavala, you are scheduled for a virtual visit with a [facility] provider today. Just as with appointments in the office, your consent must be obtained to participate. Your consent will be active for this visit and any virtual visit you may have with one of our providers in the next 365 days. If you have a MyChart account, a copy of this consent can be sent to you electronically.  As this is a virtual visit, video technology does not allow for your provider to perform a traditional examination. This may limit your provider's ability to fully assess your condition. If your provider identifies any concerns that need to be evaluated in person or the need to arrange testing (such as labs, EKG, etc.), we will make arrangements to do so. Although advances in technology are sophisticated, we cannot ensure that it will always work on either your end or our end. If the connection with a video visit is poor, the visit may have to be switched to a telephone visit. With either a video or telephone visit, we are not always able to ensure that we have a secure connection.  By engaging in this virtual visit, you consent to the provision of healthcare and authorize for your insurance to be billed (if applicable) for the services provided during this visit. Depending on your insurance coverage, you may receive a charge related to this service.  I need to obtain your verbal consent now. Are you willing to proceed with your visit today? Erin Zavala has provided verbal consent on 03/22/2023 for a virtual visit (video or telephone). Erin Kitty, FNP  Date: 03/22/2023 8:56 AM  Virtual Visit via Video Note   I, Erin Zavala, connected with  Erin Zavala  ([id number], 01-07-1966) on 03/22/23 at  9:00 AM EST by a video-enabled telemedicine application and verified that I am speaking with the correct person using two identifiers.  Location: Patient: Virtual Visit Location Patient:  Home Provider: Virtual Visit Location Provider: Home Office   I discussed the limitations of evaluation and management by telemedicine and the availability of in person appointments. The patient expressed understanding and agreed to proceed.    History of Present Illness: Erin Zavala is a 58 y.o. who identifies as a female who was assigned female at birth, and is being seen today for cough and wheezing with sinus congestion and pressure Mostly frontal and maxillary on the right side   She has a history of COPD - most recent exacerbation was in November   She feels she gets triggered by dramatic weather changes   She denies any known exposure to COVID or flu    She is wearing her oxygen  at 2L she will increase to 4L with activity   SpO2 96% currently  No fever   She does not typically take anything OTC Mucinex  typically will make her nauseated   She has been using Albuterol  as needed  Continues Breo daily   Problems:  Patient Active Problem List   Diagnosis Date Noted   Acute bronchitis with COPD (HCC) 03/30/2022   Acute rhinitis 06/16/2020   COPD (chronic obstructive pulmonary disease) (HCC) 09/18/2018   Cough 04/04/2018   Thrush 01/02/2018   Chronic respiratory failure with hypoxia (HCC) 08/19/2017   Coronary artery disease 07/23/2016   Health care maintenance 06/11/2016   Sinus congestion 03/23/2016   Hypoxemia 03/23/2016   Pulmonary hypertension (HCC) 12/28/2015   Acute bronchitis 12/28/2015   Childhood asthma    Pulmonary  sarcoidosis (HCC) 11/02/2015   GERD (gastroesophageal reflux disease) 11/02/2015    Allergies: No Known Allergies Medications:  Current Outpatient Medications:    ADCIRCA  20 MG tablet, Take 2 tablets (40 mg total) by mouth daily., Disp: 60 tablet, Rfl: 11   aspirin  81 MG tablet, Take 1 tablet (81 mg total) by mouth daily., Disp: 30 tablet, Rfl: 0   atorvastatin  (LIPITOR) 80 MG tablet, TAKE 1 TABLET EVERY DAY, Disp: 90 tablet, Rfl: 4    azelastine  (OPTIVAR ) 0.05 % ophthalmic solution, Place 1 drop into both eyes 2 (two) times daily., Disp: 6 mL, Rfl: 0   benzonatate  (TESSALON ) 200 MG capsule, Take 1 capsule (200 mg total) by mouth 3 (three) times daily as needed for cough., Disp: 30 capsule, Rfl: 1   BREO ELLIPTA  200-25 MCG/ACT AEPB, USE 1 INHALATION INTO THE LUNGS DAILY, Disp: 180 each, Rfl: 3   cetirizine  (ZYRTEC ) 10 MG tablet, TAKE 1 TABLET EVERY DAY, Disp: 90 tablet, Rfl: 4   esomeprazole  (NEXIUM ) 40 MG capsule, TAKE 1 CAPSULE EVERY DAY, Disp: 30 capsule, Rfl: 11   ezetimibe  (ZETIA ) 10 MG tablet, Take 1 tablet (10 mg total) by mouth daily., Disp: 90 tablet, Rfl: 3   ferrous sulfate  325 (65 FE) MG tablet, TAKE 1 TABLET (325 MG TOTAL) BY MOUTH 2 (TWO) TIMES DAILY WITH A MEAL., Disp: 60 tablet, Rfl: 5   guaiFENesin  (MUCINEX ) 600 MG 12 hr tablet, Take 600 mg by mouth 2 (two) times daily as needed., Disp: , Rfl:    HYDROcodone  bit-homatropine (HYCODAN) 5-1.5 MG/5ML syrup, Take 5 mLs by mouth every 6 (six) hours as needed for cough., Disp: 120 mL, Rfl: 0   ipratropium-albuterol  (DUONEB) 0.5-2.5 (3) MG/3ML SOLN, INHALE 3 ML BY NEBULIZER EVERY 6 HOURS AS NEEDED, Disp: 360 mL, Rfl: 10   OPSUMIT  10 MG tablet, Take 1 tablet (10 mg total) by mouth daily., Disp: 30 tablet, Rfl: 11   OXYGEN , 2-3 lpm 24/7  AHC, Disp: , Rfl:    predniSONE  (DELTASONE ) 10 MG tablet, 4 tabs for 3 days, then 3 tabs for 3 days, 2 tabs for 3 days, then 1 tab for 3 days, then stop, Disp: 30 tablet, Rfl: 0   predniSONE  (DELTASONE ) 2.5 MG tablet, Take 1 tablet (2.5 mg total) by mouth daily with breakfast., Disp: 30 tablet, Rfl: 5   Treprostinil  (TYVASO  DPI TITRATION KIT) 16 & 32 & 48 MCG POWD, Inhale 16 mcg into the lungs in the morning, at noon, in the evening, and at bedtime., Disp: 112 each, Rfl: 11   VENTOLIN  HFA 108 (90 Base) MCG/ACT inhaler, INHALE 2 PUFFS INTO THE LUNGS EVERY 4 HOURS AS NEEDED FOR WHEEZE OR FOR SHORTNESS OF BREATH, Disp: 18 each, Rfl: 2  Current  Facility-Administered Medications:    methylPREDNISolone  acetate (DEPO-MEDROL ) injection 80 mg, 80 mg, Intramuscular, Once, Cobb, Comer GAILS, NP  Observations/Objective: Patient is well-developed, well-nourished in no acute distress.  Resting comfortably  at home.  Head is normocephalic, atraumatic.  No labored breathing.  Speech is clear and coherent with logical content.  Patient is alert and oriented at baseline.    Assessment and Plan:   1. COPD exacerbation (HCC) (Primary)  - predniSONE  (STERAPRED UNI-PAK 21 TAB) 10 MG (21) TBPK tablet; Take 6 tablets on day one, 5 on day two, 4 on day three, 3 on day four, 2 on day five, and 1 on day six. Take with food.  Dispense: 21 tablet; Refill: 0 - azithromycin  (ZITHROMAX ) 250 MG tablet; Take 2  tablets on day 1, then 1 tablet daily on days 2 through 5  Dispense: 6 tablet; Refill: 0 - promethazine -dextromethorphan (PROMETHAZINE -DM) 6.25-15 MG/5ML syrup; Take 5 mLs by mouth 4 (four) times daily as needed for cough.  Dispense: 118 mL; Refill: 0     Follow Up Instructions: I discussed the assessment and treatment plan with the patient. The patient was provided an opportunity to ask questions and all were answered. The patient agreed with the plan and demonstrated an understanding of the instructions.  A copy of instructions were sent to the patient via MyChart unless otherwise noted below.    The patient was advised to call back or seek an in-person evaluation if the symptoms worsen or if the condition fails to improve as anticipated.    Erin Kitty, FNP

## 2023-03-30 DIAGNOSIS — J45909 Unspecified asthma, uncomplicated: Secondary | ICD-10-CM | POA: Diagnosis not present

## 2023-03-30 DIAGNOSIS — J9602 Acute respiratory failure with hypercapnia: Secondary | ICD-10-CM | POA: Diagnosis not present

## 2023-03-30 DIAGNOSIS — D869 Sarcoidosis, unspecified: Secondary | ICD-10-CM | POA: Diagnosis not present

## 2023-04-01 ENCOUNTER — Other Ambulatory Visit (HOSPITAL_COMMUNITY): Payer: Self-pay | Admitting: Cardiology

## 2023-04-11 ENCOUNTER — Other Ambulatory Visit (HOSPITAL_COMMUNITY): Payer: Self-pay

## 2023-04-11 ENCOUNTER — Telehealth (HOSPITAL_COMMUNITY): Payer: Self-pay | Admitting: Pharmacy Technician

## 2023-04-11 NOTE — Telephone Encounter (Signed)
Advanced Heart Failure Patient Advocate Encounter  Received notification for PA for Opsumit. Upon submission, insurance states that PA is not required at this time. Test claim does not show co-pay, as we are not the preferred specialty pharmacy for this medication.  Archer Asa, CPhT

## 2023-04-30 DIAGNOSIS — J9602 Acute respiratory failure with hypercapnia: Secondary | ICD-10-CM | POA: Diagnosis not present

## 2023-04-30 DIAGNOSIS — D869 Sarcoidosis, unspecified: Secondary | ICD-10-CM | POA: Diagnosis not present

## 2023-04-30 DIAGNOSIS — J45909 Unspecified asthma, uncomplicated: Secondary | ICD-10-CM | POA: Diagnosis not present

## 2023-05-01 ENCOUNTER — Other Ambulatory Visit (HOSPITAL_COMMUNITY): Payer: Self-pay | Admitting: Cardiology

## 2023-05-01 ENCOUNTER — Encounter (HOSPITAL_COMMUNITY): Payer: BC Managed Care – PPO

## 2023-05-17 ENCOUNTER — Encounter (HOSPITAL_COMMUNITY): Payer: Self-pay

## 2023-05-17 ENCOUNTER — Ambulatory Visit (HOSPITAL_COMMUNITY)
Admission: RE | Admit: 2023-05-17 | Discharge: 2023-05-17 | Disposition: A | Discharge status: Home / Self Care | Payer: BC Managed Care – PPO | Source: Ambulatory Visit | Attending: Physician Assistant | Admitting: Physician Assistant

## 2023-05-17 VITALS — BP 142/78 | HR 75 | Ht 60.0 in | Wt 158.4 lb

## 2023-05-17 DIAGNOSIS — I2510 Atherosclerotic heart disease of native coronary artery without angina pectoris: Secondary | ICD-10-CM | POA: Diagnosis not present

## 2023-05-17 DIAGNOSIS — Z9981 Dependence on supplemental oxygen: Secondary | ICD-10-CM | POA: Insufficient documentation

## 2023-05-17 DIAGNOSIS — Z955 Presence of coronary angioplasty implant and graft: Secondary | ICD-10-CM | POA: Diagnosis not present

## 2023-05-17 DIAGNOSIS — D869 Sarcoidosis, unspecified: Secondary | ICD-10-CM | POA: Diagnosis not present

## 2023-05-17 DIAGNOSIS — D860 Sarcoidosis of lung: Secondary | ICD-10-CM

## 2023-05-17 DIAGNOSIS — R030 Elevated blood-pressure reading, without diagnosis of hypertension: Secondary | ICD-10-CM | POA: Diagnosis not present

## 2023-05-17 DIAGNOSIS — J9611 Chronic respiratory failure with hypoxia: Secondary | ICD-10-CM | POA: Insufficient documentation

## 2023-05-17 DIAGNOSIS — Z7952 Long term (current) use of systemic steroids: Secondary | ICD-10-CM | POA: Insufficient documentation

## 2023-05-17 DIAGNOSIS — Z79899 Other long term (current) drug therapy: Secondary | ICD-10-CM | POA: Diagnosis not present

## 2023-05-17 DIAGNOSIS — I2721 Secondary pulmonary arterial hypertension: Secondary | ICD-10-CM | POA: Diagnosis not present

## 2023-05-17 DIAGNOSIS — D509 Iron deficiency anemia, unspecified: Secondary | ICD-10-CM | POA: Insufficient documentation

## 2023-05-17 DIAGNOSIS — Z7982 Long term (current) use of aspirin: Secondary | ICD-10-CM | POA: Diagnosis not present

## 2023-05-17 DIAGNOSIS — J449 Chronic obstructive pulmonary disease, unspecified: Secondary | ICD-10-CM | POA: Diagnosis not present

## 2023-05-17 DIAGNOSIS — I272 Pulmonary hypertension, unspecified: Secondary | ICD-10-CM

## 2023-05-17 LAB — IRON AND TIBC
Iron: 37 ug/dL (ref 28–170)
Saturation Ratios: 8 % — ABNORMAL LOW (ref 10.4–31.8)
TIBC: 438 ug/dL (ref 250–450)
UIBC: 401 ug/dL

## 2023-05-17 LAB — BRAIN NATRIURETIC PEPTIDE: B Natriuretic Peptide: 41.3 pg/mL (ref 0.0–100.0)

## 2023-05-17 LAB — BASIC METABOLIC PANEL
Anion gap: 10 (ref 5–15)
BUN: 6 mg/dL (ref 6–20)
CO2: 25 mmol/L (ref 22–32)
Calcium: 9.2 mg/dL (ref 8.9–10.3)
Chloride: 105 mmol/L (ref 98–111)
Creatinine, Ser: 0.74 mg/dL (ref 0.44–1.00)
GFR, Estimated: >60 mL/min (ref >60)
Glucose, Bld: 96 mg/dL (ref 70–99)
Potassium: 4 mmol/L (ref 3.5–5.1)
Sodium: 140 mmol/L (ref 135–145)

## 2023-05-17 LAB — CBC
HCT: 36.6 % (ref 36.0–46.0)
Hemoglobin: 11.1 g/dL — ABNORMAL LOW (ref 12.0–15.0)
MCH: 25.1 pg — ABNORMAL LOW (ref 26.0–34.0)
MCHC: 30.3 g/dL (ref 30.0–36.0)
MCV: 82.6 fL (ref 80.0–100.0)
Platelets: 282 10*3/uL (ref 150–400)
RBC: 4.43 MIL/uL (ref 3.87–5.11)
RDW: 17.4 % — ABNORMAL HIGH (ref 11.5–15.5)
WBC: 8.1 10*3/uL (ref 4.0–10.5)
nRBC: 0 % (ref 0.0–0.2)

## 2023-05-17 LAB — FERRITIN: Ferritin: 5 ng/mL — ABNORMAL LOW (ref 11–307)

## 2023-05-17 NOTE — Patient Instructions (Addendum)
Thank you for coming in today  If you had labs drawn today, any labs that are abnormal the clinic will call you No news is good news  Medications: No changes   Follow up appointments:  Your physician recommends that you schedule a follow-up appointment in:  4 months With Dr. Shirlee Latch with echocardiogram You will receive a reminder letter in the mail a few months in advance. If you don't receive a letter, please call our office to schedule the follow-up appointment.   Your physician has requested that you have an echocardiogram. Echocardiography is a painless test that uses sound waves to create images of your heart. It provides your doctor with information about the size and shape of your heart and how well your heart's chambers and valves are working. This procedure takes approximately one hour. There are no restrictions for this procedure.      Do the following things EVERYDAY: Weigh yourself in the morning before breakfast. Write it down and keep it in a log. Take your medicines as prescribed Eat low salt foods--Limit salt (sodium) to 2000 mg per day.  Stay as active as you can everyday Limit all fluids for the day to less than 2 liters   At the [facility], you and your health needs are our priority. As part of our continuing mission to provide you with exceptional heart care, we have created designated Provider Care Teams. These Care Teams include your primary Cardiologist (physician) and Advanced Practice Providers (APPs- Physician Assistants and Nurse Practitioners) who all work together to provide you with the care you need, when you need it.   You may see any of the following providers on your designated Care Team at your next follow up: Dr Arvilla Meres Dr Marca Ancona Dr. Marcos Eke, NP Robbie Lis, [place] [facility] [place], [place] Brynda Peon, NP Karle Plumber, PharmD   Please be sure to bring in all your medications  bottles to every appointment.    Thank you for choosing [facility]  If you have any questions or concerns before your next appointment please send [place] a message through [place] or call our office at [phone].    TO LEAVE A MESSAGE FOR THE NURSE SELECT OPTION 2, PLEASE LEAVE A MESSAGE INCLUDING: YOUR NAME DATE OF BIRTH CALL BACK NUMBER REASON FOR CALL**this is important as we prioritize the call backs  YOU WILL RECEIVE A CALL BACK THE SAME DAY AS LONG AS YOU CALL BEFORE 4:00 PM

## 2023-05-17 NOTE — Progress Notes (Signed)
 6 Min Walk Test Completed  Pt ambulated 1368ft (396.73m) O2 Sat ranged 97-84 on 2L oxygen HR ranged 131-92

## 2023-05-17 NOTE — Progress Notes (Addendum)
 Date:  05/17/2023   ID:  Erin Zavala, DOB Jun 17, 1965, MRN [id number]  Provider location: [place] [facility] Type of Visit: Established patient  PCP:  [email or web address]  Cardiologist: Dr. Shirlee Zavala   History of Present Illness: Erin Zavala is a 58 y.o. female who has history of sarcoidosis, chronic hypoxemic respiratory failure on home oxygen, COPD, and pulmonary hypertension.  She was diagnosed with ocular sarcoidosis in 1997. It seems like she did not have significant lung complications (that were recognized at least) until early 2017.  She now requires home oxygen.  CTA chest shows changes consistent with sarcoidosis, this has been confirmed by biopsy.  Echo in 8/17 had evidence for RV strain and elevated PA pressure.  RHC in 10/17 confirmed pulmonary arterial hypertension with normal right and left heart filling pressures.    She has started on macitentan followed by Adcirca and most recently Tyvaso.  She did not tolerate selexipag well. She feels like these medications have helped her breathing.     She went to [facility] for transplant evaluation in 4/18.  As part of that evaluation, she had right and left heart cath.  Surprisingly, given age and lack of family history, she had an 80% LAD stenosis.  This was treated with DES.  RHC showed mild to moderate pulmonary hypertension. Transplant evaluation placed on hold as she was doing better symptomatically.   Echo 02/24: EF 60-65%, mild LVH, normal RV with PASP 29 mmHg.   Had acute bronchitis requiring abx with steroid taper in 09/24, 11/24 and 01/25.    Here today for pulmonary hypertension follow-up. Has "flare-ups" with shortness of breath, congestion and flu like symptoms when weather changes. She wears 2L O2 chronically, up to 4L with exertion. Able to perform ADLs without difficulty. No orthopnea or PND. Works from home, may get some lower extremity edema after sitting too long. Improves with  standing.  SBP tends to average around 120.  No ETOH or tobacco use.  6 minute walk (2/24): 366 m 6 minute walk (3/25): 396 m     Current Outpatient Medications  Medication Sig Dispense Refill   ADCIRCA 20 MG tablet TAKE 2 TABLETS BY MOUTH 1 TIME A DAY 60 tablet 11   aspirin 81 MG tablet Take 1 tablet (81 mg total) by mouth daily. 30 tablet 0   atorvastatin (LIPITOR) 80 MG tablet TAKE 1 TABLET EVERY DAY 90 tablet 4   azelastine (OPTIVAR) 0.05 % ophthalmic solution Place 1 drop into both eyes 2 (two) times daily. 6 mL 0   benzonatate (TESSALON) 200 MG capsule Take 1 capsule (200 mg total) by mouth 3 (three) times daily as needed for cough. 30 capsule 1   BREO ELLIPTA 200-25 MCG/ACT AEPB USE 1 INHALATION INTO THE LUNGS DAILY 180 each 3   cetirizine (ZYRTEC) 10 MG tablet TAKE 1 TABLET EVERY DAY 90 tablet 4   esomeprazole (NEXIUM) 40 MG capsule TAKE 1 CAPSULE EVERY DAY 30 capsule 11   ezetimibe (ZETIA) 10 MG tablet Take 1 tablet (10 mg total) by mouth daily. 90 tablet 3   ferrous sulfate 325 (65 FE) MG tablet TAKE 1 TABLET (325 MG TOTAL) BY MOUTH 2 (TWO) TIMES DAILY WITH A MEAL. 60 tablet 5   ipratropium-albuterol (DUONEB) 0.5-2.5 (3) MG/3ML SOLN INHALE 3 ML BY NEBULIZER EVERY 6 HOURS AS NEEDED 360 mL 10   OPSUMIT 10 MG tablet Take 1 tablet daily. 30 tablet 11   OXYGEN 2-3  lpm 24/7  AHC     predniSONE (DELTASONE) 2.5 MG tablet Take 1 tablet (2.5 mg total) by mouth daily with breakfast. 30 tablet 5   Treprostinil (TYVASO DPI TITRATION KIT) 16 & 32 & 48 MCG POWD Inhale 16 mcg into the lungs in the morning, at noon, in the evening, and at bedtime. 112 each 11   VENTOLIN HFA 108 (90 Base) MCG/ACT inhaler INHALE 2 PUFFS INTO THE LUNGS EVERY 4 HOURS AS NEEDED FOR WHEEZE OR FOR SHORTNESS OF BREATH 18 each 2   Current Facility-Administered Medications  Medication Dose Route Frequency Provider Last Rate Last Admin   methylPREDNISolone acetate (DEPO-MEDROL) injection 80 mg  80 mg Intramuscular  Once Erin Zavala, Erin Cola, NP        Allergies:   Patient has no known allergies.   Social History:  The patient  reports that she quit smoking about 12 years ago. Her smoking use included cigarettes. She started smoking about 37 years ago. She has a 6.2 pack-year smoking history. She has never used smokeless tobacco. She reports that she does not drink alcohol and does not use drugs.   Family History:  The patient's family history includes Hypertension in her father and mother; Sarcoidosis in her maternal aunt.   ROS:  Please see the history of present illness.   All other systems are personally reviewed and negative.   Exam:   BP (!) 142/78   Pulse 75   Ht 5' (1.524 m)   Wt 71.8 kg (158 lb 6.4 oz)   SpO2 99%   BMI 30.94 kg/m  General:  Well appearing. Comfortable on 2L O2 Erin Zavala. Neck: no JVD.  Cor: Regular rate & rhythm. No rubs, gallops or murmurs. Lungs: clear Abdomen: soft, nontender, nondistended.  Extremities: trace edema Neuro: alert & orientedx3. Affect pleasant   Recent Labs: 11/19/2022: BUN 5; Creatinine, Ser 0.81; Hemoglobin 9.6; Platelets 450.0; Potassium 3.7; Pro B Natriuretic peptide (BNP) 25.0; Sodium 141  Personally reviewed   Wt Readings from Last 3 Encounters:  05/17/23 71.8 kg (158 lb 6.4 oz)  11/19/22 67.8 kg (149 lb 6.4 oz)  05/08/22 72.9 kg (160 lb 12.8 oz)      ASSESSMENT AND PLAN:  1. Sarcoidosis: Ocular and lung involvement, biopsy-proven.  Cardiac MRI in 5/18 did not show evidence for cardiac sarcoidosis.   - She is on home oxygen.  - She has been evaluated at [facility] for lung transplant, this is currently on hold as she had clinically improved on PH meds.   - Remains on 2.5 mg prednisone daily. - Follows with Dr. Delton Zavala.  2. Pulmonary hypertension: Pulmonary arterial hypertension.  - Suspect mixed group 1 and group 5 PH.  - CTA chest showed no PE but did show changes of sarcoidosis.   - PFTs were restrictive.   - Echo showed RV strain and elevated PA  pressure.  PAH was confirmed by RHC in 10/17 with PVR 7.4 WU.   - Serologic workup was negative.   - RHC in 4/18 showed mild to moderate residual pulmonary hypertension. - She has felt like generic tadalafil has not been as effective as brand name Adcirca.  She is tolerating Tyvaso (unable to tolerate Cordie Grice).   - Echo 02/24 normal-appearing RV and PASP 29 mmHg.  - Stable O2 requirement. Improved 6 minute walk today. - She is not volume overloaded - Continue Opsumit   - Continue brand-name Adcirca 40 mg daily.  - Continue Tyvaso DPI.   - Check BNP. -  Repeat echo at time of follow-up in 4 months.  3. CAD: Found incidentally on cath 4/18 done as part of workup for lung transplantation.  80% LAD stenosis, treated with DES to LAD.    - No chest pain. - Continue 81 mg aspirin daily - Continue Atorvastatin 80 mg daily. LDL 72 in 06/24. Lipid panel at next visit.  4. Chronic microcytic anemia: - Has hx of iron deficiency. No longer on po iron. - Hgb 9.6 and MCV low in 09/24. No melena.  - Check CBC and iron studies. May benefit from IV iron.  - Recommended screening colonoscopy. Recommended she establish with PCP. Her prior PCP is no longer in her insurance network.   5. Elevated BP - No diagnosis of HTN - BP routinely 120 systolic at home - Continue to monitor - Discussed lifestyle modifications including diet and exercise  Follow-up: 4 months with Dr. Shirlee Zavala with echo , [facility] Zavala, [facility], PA-C  05/17/2023

## 2023-05-21 ENCOUNTER — Other Ambulatory Visit (HOSPITAL_COMMUNITY): Payer: Self-pay | Admitting: *Deleted

## 2023-05-21 ENCOUNTER — Telehealth (HOSPITAL_COMMUNITY): Payer: Self-pay | Admitting: *Deleted

## 2023-05-21 DIAGNOSIS — I5022 Chronic systolic (congestive) heart failure: Secondary | ICD-10-CM

## 2023-05-21 DIAGNOSIS — E611 Iron deficiency: Secondary | ICD-10-CM

## 2023-05-21 NOTE — Telephone Encounter (Signed)
 Called patient per Erin Zavala with following:  "Iron stores are low. Please see if IV iron can be set up for her. I also recommend she establish care with a PCP to get age related screenings like colonoscopy."  Patient agrees to IV iron - will place order and forward to Philicia Branch, CMA for approval and scheduling.

## 2023-05-28 DIAGNOSIS — J9602 Acute respiratory failure with hypercapnia: Secondary | ICD-10-CM | POA: Diagnosis not present

## 2023-05-28 DIAGNOSIS — D869 Sarcoidosis, unspecified: Secondary | ICD-10-CM | POA: Diagnosis not present

## 2023-05-28 DIAGNOSIS — J45909 Unspecified asthma, uncomplicated: Secondary | ICD-10-CM | POA: Diagnosis not present

## 2023-05-30 ENCOUNTER — Ambulatory Visit (HOSPITAL_COMMUNITY)
Admission: RE | Admit: 2023-05-30 | Discharge: 2023-05-30 | Disposition: A | Discharge status: Home / Self Care | Source: Ambulatory Visit | Attending: Cardiology | Admitting: Cardiology

## 2023-05-30 DIAGNOSIS — I5022 Chronic systolic (congestive) heart failure: Secondary | ICD-10-CM | POA: Insufficient documentation

## 2023-05-30 DIAGNOSIS — E611 Iron deficiency: Secondary | ICD-10-CM | POA: Insufficient documentation

## 2023-05-30 MED ORDER — FERUMOXYTOL INJECTION 510 MG/17 ML: 510.0000 mg | Freq: Once | INTRAVENOUS | Status: DC

## 2023-05-30 MED ORDER — SODIUM CHLORIDE 0.9 % IV SOLN
510.0000 mg | Freq: Once | INTRAVENOUS | Status: AC
  Administered 2023-05-30: 510 mg via INTRAVENOUS
  Filled 2023-05-30: qty 510

## 2023-05-30 NOTE — Progress Notes (Signed)
 Pt here for feraheme.  Received approx 58cc of med and began complaining of shortness of breath.  Med stopped, VSS.  Within 5 minutes, pt states that shortness of breath resolved.  Heart failure clinic notified

## 2023-06-11 ENCOUNTER — Telehealth (HOSPITAL_COMMUNITY): Payer: Self-pay | Admitting: Pharmacist

## 2023-06-11 ENCOUNTER — Other Ambulatory Visit (HOSPITAL_COMMUNITY): Payer: Self-pay

## 2023-06-11 NOTE — Telephone Encounter (Signed)
 Patient Advocate Encounter   Received notification from [facility] that prior authorization for Opsumit is required.   PA submitted on CoverMyMeds Key [id number]  Status is pending   Will continue to follow.   Karle Plumber, PharmD, BCPS, BCCP, CPP Heart Failure Clinic Pharmacist [phone]

## 2023-06-14 NOTE — Telephone Encounter (Signed)
 Advanced Heart Failure Patient Advocate Encounter  Prior Authorization for Opsumit has been approved.     Effective dates: 05/13/23 through 06/11/24  Karle Plumber, PharmD, BCPS, BCCP, CPP Heart Failure Clinic Pharmacist [phone]

## 2023-06-28 DIAGNOSIS — J9602 Acute respiratory failure with hypercapnia: Secondary | ICD-10-CM | POA: Diagnosis not present

## 2023-06-28 DIAGNOSIS — J45909 Unspecified asthma, uncomplicated: Secondary | ICD-10-CM | POA: Diagnosis not present

## 2023-07-28 DIAGNOSIS — J45909 Unspecified asthma, uncomplicated: Secondary | ICD-10-CM | POA: Diagnosis not present

## 2023-07-28 DIAGNOSIS — D869 Sarcoidosis, unspecified: Secondary | ICD-10-CM | POA: Diagnosis not present

## 2023-07-28 DIAGNOSIS — J9602 Acute respiratory failure with hypercapnia: Secondary | ICD-10-CM | POA: Diagnosis not present

## 2023-07-31 ENCOUNTER — Other Ambulatory Visit: Payer: Self-pay | Admitting: Nurse Practitioner

## 2023-07-31 DIAGNOSIS — D860 Sarcoidosis of lung: Secondary | ICD-10-CM

## 2023-08-28 DIAGNOSIS — J45909 Unspecified asthma, uncomplicated: Secondary | ICD-10-CM | POA: Diagnosis not present

## 2023-08-28 DIAGNOSIS — J9602 Acute respiratory failure with hypercapnia: Secondary | ICD-10-CM | POA: Diagnosis not present

## 2023-08-28 DIAGNOSIS — D869 Sarcoidosis, unspecified: Secondary | ICD-10-CM | POA: Diagnosis not present

## 2023-08-29 ENCOUNTER — Telehealth: Admitting: Family Medicine

## 2023-08-29 DIAGNOSIS — R051 Acute cough: Secondary | ICD-10-CM | POA: Diagnosis not present

## 2023-08-29 DIAGNOSIS — J441 Chronic obstructive pulmonary disease with (acute) exacerbation: Secondary | ICD-10-CM | POA: Diagnosis not present

## 2023-08-29 DIAGNOSIS — J440 Chronic obstructive pulmonary disease with acute lower respiratory infection: Secondary | ICD-10-CM | POA: Diagnosis not present

## 2023-08-29 DIAGNOSIS — J209 Acute bronchitis, unspecified: Secondary | ICD-10-CM

## 2023-08-29 MED ORDER — BENZONATATE 200 MG PO CAPS: 200.0000 mg | ORAL_CAPSULE | Freq: Three times a day (TID) | ORAL | 1 refills | Status: DC | PRN

## 2023-08-29 MED ORDER — PREDNISONE 20 MG PO TABS
20.0000 mg | ORAL_TABLET | Freq: Two times a day (BID) | ORAL | 0 refills | Status: AC
Start: 2023-08-29 — End: 2023-09-03

## 2023-08-29 NOTE — Progress Notes (Signed)
 Pt did not show up for visit-[facility]B

## 2023-08-29 NOTE — Progress Notes (Signed)
 E-Visit for Cough   We are sorry that you are not feeling well.  Here is how we plan to help!  Based on your presentation I believe you most likely have A cough due to a virus.  This is called viral bronchitis and is best treated by rest, plenty of fluids and control of the cough.  You may use Ibuprofen or Tylenol as directed to help your symptoms.     In addition you may use A prescription cough medication called Tessalon Perles 100mg . You may take 1-2 capsules every 8 hours as needed for your cough.  I have also sent prednisone.   From your responses in the eVisit questionnaire you describe inflammation in the upper respiratory tract which is causing a significant cough.  This is commonly called Bronchitis and has four common causes:   Allergies Viral Infections Acid Reflux Bacterial Infection Allergies, viruses and acid reflux are treated by controlling symptoms or eliminating the cause. An example might be a cough caused by taking certain blood pressure medications. You stop the cough by changing the medication. Another example might be a cough caused by acid reflux. Controlling the reflux helps control the cough.  USE OF BRONCHODILATOR ("RESCUE") INHALERS: There is a risk from using your bronchodilator too frequently.  The risk is that over-reliance on a medication which only relaxes the muscles surrounding the breathing tubes can reduce the effectiveness of medications prescribed to reduce swelling and congestion of the tubes themselves.  Although you feel brief relief from the bronchodilator inhaler, your asthma may actually be worsening with the tubes becoming more swollen and filled with mucus.  This can delay other crucial treatments, such as oral steroid medications. If you need to use a bronchodilator inhaler daily, several times per day, you should discuss this with your provider.  There are probably better treatments that could be used to keep your asthma under control.     HOME  CARE Only take medications as instructed by your medical team. Complete the entire course of an antibiotic. Drink plenty of fluids and get plenty of rest. Avoid close contacts especially the very young and the elderly Cover your mouth if you cough or cough into your sleeve. Always remember to wash your hands A steam or ultrasonic humidifier can help congestion.   GET HELP RIGHT AWAY IF: You develop worsening fever. You become short of breath You cough up blood. Your symptoms persist after you have completed your treatment plan MAKE SURE YOU  Understand these instructions. Will watch your condition. Will get help right away if you are not doing well or get worse.    Thank you for choosing an e-visit.  Your e-visit answers were reviewed by a board certified advanced clinical practitioner to complete your personal care plan. Depending upon the condition, your plan could have included both over the counter or prescription medications.  Please review your pharmacy choice. Make sure the pharmacy is open so you can pick up prescription now. If there is a problem, you may contact your provider through [employer] and have the prescription routed to another pharmacy.  Your safety is important to [place]. If you have drug allergies check your prescription carefully.   For the next 24 hours you can use MyChart to ask questions about today's visit, request a non-urgent call back, or ask for a work or school excuse. You will get an email in the next two days asking about your experience. I hope that your e-visit has been  valuable and will speed your recovery.    have provided 5 minutes of non face to face time during this encounter for chart review and documentation.

## 2023-09-08 ENCOUNTER — Telehealth: Admitting: Physician Assistant

## 2023-09-08 DIAGNOSIS — R0602 Shortness of breath: Secondary | ICD-10-CM

## 2023-09-08 DIAGNOSIS — J069 Acute upper respiratory infection, unspecified: Secondary | ICD-10-CM

## 2023-09-08 NOTE — Progress Notes (Signed)
 Virtual Visit Consent   Erin Zavala, you are scheduled for a virtual visit with a [facility] provider today. Just as with appointments in the office, your consent must be obtained to participate. Your consent will be active for this visit and any virtual visit you may have with one of our providers in the next 365 days. If you have a MyChart account, a copy of this consent can be sent to you electronically.  As this is a virtual visit, video technology does not allow for your provider to perform a traditional examination. This may limit your provider's ability to fully assess your condition. If your provider identifies any concerns that need to be evaluated in person or the need to arrange testing (such as labs, EKG, etc.), we will make arrangements to do so. Although advances in technology are sophisticated, we cannot ensure that it will always work on either your end or our end. If the connection with a video visit is poor, the visit may have to be switched to a telephone visit. With either a video or telephone visit, we are not always able to ensure that we have a secure connection.  By engaging in this virtual visit, you consent to the provision of healthcare and authorize for your insurance to be billed (if applicable) for the services provided during this visit. Depending on your insurance coverage, you may receive a charge related to this service.  I need to obtain your verbal consent now. Are you willing to proceed with your visit today? Erin Zavala has provided verbal consent on 09/08/2023 for a virtual visit (video or telephone). Teena Shuck, [place]  Date: 09/08/2023 11:12 AM   Virtual Visit via Video Note   I, Teena Shuck, connected with  Erin Zavala  ([id number], 1965-05-26) on 09/08/23 at 11:15 AM EDT by a video-enabled telemedicine application and verified that I am speaking with the correct person using two identifiers.  Location: Patient: Virtual Visit Location Patient:  Home Provider: Virtual Visit Location Provider: Home Office   I discussed the limitations of evaluation and management by telemedicine and the availability of in person appointments. The patient expressed understanding and agreed to proceed.    History of Present Illness: Erin Zavala is a 58 y.o. who identifies as a female who was assigned female at birth, and is being seen today for shortness of breath.  HPI: Shortness of Breath This is a recurrent problem. The current episode started in the past 7 days. The problem occurs constantly. The problem has been gradually worsening. Nothing aggravates the symptoms. The patient has no known risk factors for DVT/PE. Her past medical history is significant for COPD.    Problems:  Patient Active Problem List   Diagnosis Date Noted   Acute bronchitis with COPD (HCC) 03/30/2022   Acute rhinitis 06/16/2020   COPD (chronic obstructive pulmonary disease) (HCC) 09/18/2018   Cough 04/04/2018   Thrush 01/02/2018   Chronic respiratory failure with hypoxia (HCC) 08/19/2017   Coronary artery disease 07/23/2016   Health care maintenance 06/11/2016   Sinus congestion 03/23/2016   Hypoxemia 03/23/2016   Pulmonary hypertension (HCC) 12/28/2015   Acute bronchitis 12/28/2015   Childhood asthma    Pulmonary sarcoidosis (HCC) 11/02/2015   GERD (gastroesophageal reflux disease) 11/02/2015    Allergies:  Allergies  Allergen Reactions   Feraheme [Ferumoxytol ] Shortness Of Breath   Medications:  Current Outpatient Medications:    ADCIRCA  20 MG tablet, TAKE 2 TABLETS BY MOUTH 1 TIME A DAY, Disp: 60 tablet,  Rfl: 11   aspirin  81 MG tablet, Take 1 tablet (81 mg total) by mouth daily., Disp: 30 tablet, Rfl: 0   atorvastatin  (LIPITOR) 80 MG tablet, TAKE 1 TABLET EVERY DAY, Disp: 90 tablet, Rfl: 4   azelastine  (OPTIVAR ) 0.05 % ophthalmic solution, Place 1 drop into both eyes 2 (two) times daily., Disp: 6 mL, Rfl: 0   benzonatate  (TESSALON ) 200 MG capsule, Take 1  capsule (200 mg total) by mouth 3 (three) times daily as needed for cough., Disp: 30 capsule, Rfl: 1   BREO ELLIPTA  200-25 MCG/ACT AEPB, USE 1 INHALATION INTO THE LUNGS DAILY, Disp: 180 each, Rfl: 3   cetirizine  (ZYRTEC ) 10 MG tablet, TAKE 1 TABLET EVERY DAY, Disp: 90 tablet, Rfl: 4   esomeprazole  (NEXIUM ) 40 MG capsule, TAKE 1 CAPSULE EVERY DAY, Disp: 30 capsule, Rfl: 11   ezetimibe  (ZETIA ) 10 MG tablet, Take 1 tablet (10 mg total) by mouth daily., Disp: 90 tablet, Rfl: 3   ferrous sulfate  325 (65 FE) MG tablet, TAKE 1 TABLET (325 MG TOTAL) BY MOUTH 2 (TWO) TIMES DAILY WITH A MEAL., Disp: 60 tablet, Rfl: 5   ipratropium-albuterol  (DUONEB) 0.5-2.5 (3) MG/3ML SOLN, INHALE 3 ML BY NEBULIZER EVERY 6 HOURS AS NEEDED, Disp: 360 mL, Rfl: 10   OPSUMIT  10 MG tablet, Take 1 tablet daily., Disp: 30 tablet, Rfl: 11   OXYGEN , 2-3 lpm 24/7  AHC, Disp: , Rfl:    predniSONE  (DELTASONE ) 2.5 MG tablet, TAKE 1 TABLET BY MOUTH DAILY WITH BREAKFAST., Disp: 30 tablet, Rfl: 5   Treprostinil  (TYVASO  DPI TITRATION KIT) 16 & 32 & 48 MCG POWD, Inhale 16 mcg into the lungs in the morning, at noon, in the evening, and at bedtime., Disp: 112 each, Rfl: 11   VENTOLIN  HFA 108 (90 Base) MCG/ACT inhaler, INHALE 2 PUFFS INTO THE LUNGS EVERY 4 HOURS AS NEEDED FOR WHEEZE OR FOR SHORTNESS OF BREATH, Disp: 18 each, Rfl: 2  Current Facility-Administered Medications:    methylPREDNISolone  acetate (DEPO-MEDROL ) injection 80 mg, 80 mg, Intramuscular, Once, Cobb, Comer GAILS, NP  Observations/Objective: Patient is well-developed, well-nourished in no acute distress.  Resting comfortably  at home.  Head is normocephalic, atraumatic.  No labored breathing.  Speech is clear and coherent with logical content.  Patient is alert and oriented at baseline.    Assessment and Plan: 1. Shortness of breath (Primary)  2. Upper respiratory tract infection, unspecified type  Patient presenting with shortness of breath and cough x 7 days.   Patient previously evaluated via e-visit.  States her symptoms have not improved since that time.  Given her history of COPD and concern for possible pneumonia versus COPD exacerbation, I recommended that this patient need to emergently report to the emergency department for evaluation.  The patient verbalized understanding and agreement with this plan and agreed to present to the ER for evaluation.  Follow Up Instructions: I discussed the assessment and treatment plan with the patient. The patient was provided an opportunity to ask questions and all were answered. The patient agreed with the plan and demonstrated an understanding of the instructions.  A copy of instructions were sent to the patient via MyChart unless otherwise noted below.     The patient was advised to call back or seek an in-person evaluation if the symptoms worsen or if the condition fails to improve as anticipated.    Teena Shuck, PA-C

## 2023-09-08 NOTE — Patient Instructions (Signed)
 Nena Hilts, thank you for joining Teena Shuck, PA-C for today's virtual visit.  While this provider is not your primary care provider (PCP), if your PCP is located in our provider database this encounter information will be shared with them immediately following your visit.   A [facility] MyChart account gives you access to today's visit and all your visits, tests, and labs performed at [facility]  click here if you don't have a [facility] MyChart account or go to [email or web address]  Consent: (Patient) Erin Zavala provided verbal consent for this virtual visit at the beginning of the encounter.  Current Medications:  Current Outpatient Medications:    ADCIRCA  20 MG tablet, TAKE 2 TABLETS BY MOUTH 1 TIME A DAY, Disp: 60 tablet, Rfl: 11   aspirin  81 MG tablet, Take 1 tablet (81 mg total) by mouth daily., Disp: 30 tablet, Rfl: 0   atorvastatin  (LIPITOR) 80 MG tablet, TAKE 1 TABLET EVERY DAY, Disp: 90 tablet, Rfl: 4   azelastine  (OPTIVAR ) 0.05 % ophthalmic solution, Place 1 drop into both eyes 2 (two) times daily., Disp: 6 mL, Rfl: 0   benzonatate  (TESSALON ) 200 MG capsule, Take 1 capsule (200 mg total) by mouth 3 (three) times daily as needed for cough., Disp: 30 capsule, Rfl: 1   BREO ELLIPTA  200-25 MCG/ACT AEPB, USE 1 INHALATION INTO THE LUNGS DAILY, Disp: 180 each, Rfl: 3   cetirizine  (ZYRTEC ) 10 MG tablet, TAKE 1 TABLET EVERY DAY, Disp: 90 tablet, Rfl: 4   esomeprazole  (NEXIUM ) 40 MG capsule, TAKE 1 CAPSULE EVERY DAY, Disp: 30 capsule, Rfl: 11   ezetimibe  (ZETIA ) 10 MG tablet, Take 1 tablet (10 mg total) by mouth daily., Disp: 90 tablet, Rfl: 3   ferrous sulfate  325 (65 FE) MG tablet, TAKE 1 TABLET (325 MG TOTAL) BY MOUTH 2 (TWO) TIMES DAILY WITH A MEAL., Disp: 60 tablet, Rfl: 5   ipratropium-albuterol  (DUONEB) 0.5-2.5 (3) MG/3ML SOLN, INHALE 3 ML BY NEBULIZER EVERY 6 HOURS AS NEEDED, Disp: 360 mL, Rfl: 10   OPSUMIT  10 MG tablet, Take 1 tablet daily., Disp: 30  tablet, Rfl: 11   OXYGEN , 2-3 lpm 24/7  AHC, Disp: , Rfl:    predniSONE  (DELTASONE ) 2.5 MG tablet, TAKE 1 TABLET BY MOUTH DAILY WITH BREAKFAST., Disp: 30 tablet, Rfl: 5   Treprostinil  (TYVASO  DPI TITRATION KIT) 16 & 32 & 48 MCG POWD, Inhale 16 mcg into the lungs in the morning, at noon, in the evening, and at bedtime., Disp: 112 each, Rfl: 11   VENTOLIN  HFA 108 (90 Base) MCG/ACT inhaler, INHALE 2 PUFFS INTO THE LUNGS EVERY 4 HOURS AS NEEDED FOR WHEEZE OR FOR SHORTNESS OF BREATH, Disp: 18 each, Rfl: 2  Current Facility-Administered Medications:    methylPREDNISolone  acetate (DEPO-MEDROL ) injection 80 mg, 80 mg, Intramuscular, Once, Cobb, Katherine V, NP   Medications ordered in this encounter:  No orders of the defined types were placed in this encounter.    *If you need refills on other medications prior to your next appointment, please contact your pharmacy*  Follow-Up: Call back or seek an in-person evaluation if the symptoms worsen or if the condition fails to improve as anticipated.  [facility] [phone]  Other Instructions Report to ER.    If you have been instructed to have an in-person evaluation today at a local Urgent Care facility, please use the link below. It will take you to a list of all of our available [facility], including address, phone number  and hours of operation. Please do not delay care.  [facility]  If you or a family member do not have a primary care provider, use the link below to schedule a visit and establish care. When you choose a [facility] primary care physician or advanced practice provider, you gain a long-term partner in health. Find a Primary Care Provider  Learn more about [facility]'s in-office and virtual care options: [facility] - Get Care Now

## 2023-09-13 ENCOUNTER — Telehealth: Admitting: Physician Assistant

## 2023-09-13 DIAGNOSIS — J069 Acute upper respiratory infection, unspecified: Secondary | ICD-10-CM

## 2023-09-13 DIAGNOSIS — B9689 Other specified bacterial agents as the cause of diseases classified elsewhere: Secondary | ICD-10-CM

## 2023-09-13 MED ORDER — PSEUDOEPH-BROMPHEN-DM 30-2-10 MG/5ML PO SYRP: 5.0000 mL | ORAL_SOLUTION | Freq: Three times a day (TID) | ORAL | 0 refills | Status: DC | PRN

## 2023-09-13 MED ORDER — AMOXICILLIN-POT CLAVULANATE 875-125 MG PO TABS: 1.0000 | ORAL_TABLET | Freq: Two times a day (BID) | ORAL | 0 refills | Status: DC

## 2023-09-13 NOTE — Progress Notes (Signed)
 Virtual Visit Consent   Erin Zavala, you are scheduled for a virtual visit with a [facility] provider today. Just as with appointments in the office, your consent must be obtained to participate. Your consent will be active for this visit and any virtual visit you may have with one of our providers in the next 365 days. If you have a MyChart account, a copy of this consent can be sent to you electronically.  As this is a virtual visit, video technology does not allow for your provider to perform a traditional examination. This may limit your provider's ability to fully assess your condition. If your provider identifies any concerns that need to be evaluated in person or the need to arrange testing (such as labs, EKG, etc.), we will make arrangements to do so. Although advances in technology are sophisticated, we cannot ensure that it will always work on either your end or our end. If the connection with a video visit is poor, the visit may have to be switched to a telephone visit. With either a video or telephone visit, we are not always able to ensure that we have a secure connection.  By engaging in this virtual visit, you consent to the provision of healthcare and authorize for your insurance to be billed (if applicable) for the services provided during this visit. Depending on your insurance coverage, you may receive a charge related to this service.  I need to obtain your verbal consent now. Are you willing to proceed with your visit today? Erin Zavala has provided verbal consent on 09/13/2023 for a virtual visit (video or telephone). Delon CHRISTELLA Dickinson, PA-C  Date: 09/13/2023 2:34 PM   Virtual Visit via Video Note   I, Delon CHRISTELLA Dickinson, connected with  Erin Zavala  ([id number], Dec 06, 1965) on 09/13/23 at  2:30 PM EDT by a video-enabled telemedicine application and verified that I am speaking with the correct person using two identifiers.  Location: Patient: Virtual Visit Location  Patient: Home Provider: Virtual Visit Location Provider: Home Office   I discussed the limitations of evaluation and management by telemedicine and the availability of in person appointments. The patient expressed understanding and agreed to proceed.    History of Present Illness: Erin Zavala is a 58 y.o. who identifies as a female who was assigned female at birth, and is being seen today for sinus congestion and wheezing.  HPI: Sinusitis This is a new problem. The current episode started 1 to 4 weeks ago (Symptoms started on 08/29/23). The problem has been gradually worsening since onset. There has been no fever. The pain is moderate. Associated symptoms include congestion, coughing, diaphoresis, headaches and sinus pressure (with thick purulent drainage). Pertinent negatives include no chills, ear pain, shortness of breath, sneezing, sore throat or swollen glands. (wheezing) Treatments tried: tessalon  perles, prednisone , saline nasal rinses, claritin . The treatment provided mild relief.     Problems:  Patient Active Problem List   Diagnosis Date Noted   Acute bronchitis with COPD (HCC) 03/30/2022   Acute rhinitis 06/16/2020   COPD (chronic obstructive pulmonary disease) (HCC) 09/18/2018   Cough 04/04/2018   Thrush 01/02/2018   Chronic respiratory failure with hypoxia (HCC) 08/19/2017   Coronary artery disease 07/23/2016   Health care maintenance 06/11/2016   Sinus congestion 03/23/2016   Hypoxemia 03/23/2016   Pulmonary hypertension (HCC) 12/28/2015   Acute bronchitis 12/28/2015   Childhood asthma    Pulmonary sarcoidosis (HCC) 11/02/2015   GERD (gastroesophageal reflux disease) 11/02/2015    Allergies:  Allergies  Allergen Reactions   Feraheme [Ferumoxytol ] Shortness Of Breath   Medications:  Current Outpatient Medications:    amoxicillin -clavulanate (AUGMENTIN ) 875-125 MG tablet, Take 1 tablet by mouth 2 (two) times daily., Disp: 20 tablet, Rfl: 0    brompheniramine-pseudoephedrine-DM 30-2-10 MG/5ML syrup, Take 5 mLs by mouth 3 (three) times daily as needed., Disp: 120 mL, Rfl: 0   ADCIRCA  20 MG tablet, TAKE 2 TABLETS BY MOUTH 1 TIME A DAY, Disp: 60 tablet, Rfl: 11   aspirin  81 MG tablet, Take 1 tablet (81 mg total) by mouth daily., Disp: 30 tablet, Rfl: 0   atorvastatin  (LIPITOR) 80 MG tablet, TAKE 1 TABLET EVERY DAY, Disp: 90 tablet, Rfl: 4   azelastine  (OPTIVAR ) 0.05 % ophthalmic solution, Place 1 drop into both eyes 2 (two) times daily., Disp: 6 mL, Rfl: 0   benzonatate  (TESSALON ) 200 MG capsule, Take 1 capsule (200 mg total) by mouth 3 (three) times daily as needed for cough., Disp: 30 capsule, Rfl: 1   BREO ELLIPTA  200-25 MCG/ACT AEPB, USE 1 INHALATION INTO THE LUNGS DAILY, Disp: 180 each, Rfl: 3   cetirizine  (ZYRTEC ) 10 MG tablet, TAKE 1 TABLET EVERY DAY, Disp: 90 tablet, Rfl: 4   esomeprazole  (NEXIUM ) 40 MG capsule, TAKE 1 CAPSULE EVERY DAY, Disp: 30 capsule, Rfl: 11   ezetimibe  (ZETIA ) 10 MG tablet, Take 1 tablet (10 mg total) by mouth daily., Disp: 90 tablet, Rfl: 3   ferrous sulfate  325 (65 FE) MG tablet, TAKE 1 TABLET (325 MG TOTAL) BY MOUTH 2 (TWO) TIMES DAILY WITH A MEAL., Disp: 60 tablet, Rfl: 5   ipratropium-albuterol  (DUONEB) 0.5-2.5 (3) MG/3ML SOLN, INHALE 3 ML BY NEBULIZER EVERY 6 HOURS AS NEEDED, Disp: 360 mL, Rfl: 10   OPSUMIT  10 MG tablet, Take 1 tablet daily., Disp: 30 tablet, Rfl: 11   OXYGEN , 2-3 lpm 24/7  AHC, Disp: , Rfl:    predniSONE  (DELTASONE ) 2.5 MG tablet, TAKE 1 TABLET BY MOUTH DAILY WITH BREAKFAST., Disp: 30 tablet, Rfl: 5   Treprostinil  (TYVASO  DPI TITRATION KIT) 16 & 32 & 48 MCG POWD, Inhale 16 mcg into the lungs in the morning, at noon, in the evening, and at bedtime., Disp: 112 each, Rfl: 11   VENTOLIN  HFA 108 (90 Base) MCG/ACT inhaler, INHALE 2 PUFFS INTO THE LUNGS EVERY 4 HOURS AS NEEDED FOR WHEEZE OR FOR SHORTNESS OF BREATH, Disp: 18 each, Rfl: 2  Current Facility-Administered Medications:     methylPREDNISolone  acetate (DEPO-MEDROL ) injection 80 mg, 80 mg, Intramuscular, Once, Cobb, Comer GAILS, NP  Observations/Objective: Patient is well-developed, well-nourished in no acute distress.  Resting comfortably at home.  Head is normocephalic, atraumatic.  No labored breathing.  Speech is clear and coherent with logical content.  Patient is alert and oriented at baseline.   Assessment and Plan: 1. Bacterial upper respiratory infection (Primary) - amoxicillin -clavulanate (AUGMENTIN ) 875-125 MG tablet; Take 1 tablet by mouth 2 (two) times daily.  Dispense: 20 tablet; Refill: 0 - brompheniramine-pseudoephedrine-DM 30-2-10 MG/5ML syrup; Take 5 mLs by mouth 3 (three) times daily as needed.  Dispense: 120 mL; Refill: 0  - Worsening over a week despite OTC medications - Will treat with Augmentin  and Bromfed DM - Can continue Tessalon  perles - Advised she could take her daily Prednisone  2.5mg  and increase to 5mg  (2 tablets) daily for the next 5 days - Push fluids.  - Rest.  - Steam and humidifier can help - Seek in person evaluation if worsening or symptoms fail to improve  Follow Up Instructions: I discussed the assessment and treatment plan with the patient. The patient was provided an opportunity to ask questions and all were answered. The patient agreed with the plan and demonstrated an understanding of the instructions.  A copy of instructions were sent to the patient via MyChart unless otherwise noted below.    The patient was advised to call back or seek an in-person evaluation if the symptoms worsen or if the condition fails to improve as anticipated.    Delon CHRISTELLA Dickinson, PA-C

## 2023-09-13 NOTE — Patient Instructions (Signed)
 Nena Hilts, thank you for joining Delon CHRISTELLA Dickinson, PA-C for today's virtual visit.  While this provider is not your primary care provider (PCP), if your PCP is located in our provider database this encounter information will be shared with them immediately following your visit.   A [facility] MyChart account gives you access to today's visit and all your visits, tests, and labs performed at [facility]  click here if you don't have a [facility] MyChart account or go to [email or web address]  Consent: (Patient) Serayah Yazdani provided verbal consent for this virtual visit at the beginning of the encounter.  Current Medications:  Current Outpatient Medications:    amoxicillin -clavulanate (AUGMENTIN ) 875-125 MG tablet, Take 1 tablet by mouth 2 (two) times daily., Disp: 20 tablet, Rfl: 0   brompheniramine-pseudoephedrine-DM 30-2-10 MG/5ML syrup, Take 5 mLs by mouth 3 (three) times daily as needed., Disp: 120 mL, Rfl: 0   ADCIRCA  20 MG tablet, TAKE 2 TABLETS BY MOUTH 1 TIME A DAY, Disp: 60 tablet, Rfl: 11   aspirin  81 MG tablet, Take 1 tablet (81 mg total) by mouth daily., Disp: 30 tablet, Rfl: 0   atorvastatin  (LIPITOR) 80 MG tablet, TAKE 1 TABLET EVERY DAY, Disp: 90 tablet, Rfl: 4   azelastine  (OPTIVAR ) 0.05 % ophthalmic solution, Place 1 drop into both eyes 2 (two) times daily., Disp: 6 mL, Rfl: 0   benzonatate  (TESSALON ) 200 MG capsule, Take 1 capsule (200 mg total) by mouth 3 (three) times daily as needed for cough., Disp: 30 capsule, Rfl: 1   BREO ELLIPTA  200-25 MCG/ACT AEPB, USE 1 INHALATION INTO THE LUNGS DAILY, Disp: 180 each, Rfl: 3   cetirizine  (ZYRTEC ) 10 MG tablet, TAKE 1 TABLET EVERY DAY, Disp: 90 tablet, Rfl: 4   esomeprazole  (NEXIUM ) 40 MG capsule, TAKE 1 CAPSULE EVERY DAY, Disp: 30 capsule, Rfl: 11   ezetimibe  (ZETIA ) 10 MG tablet, Take 1 tablet (10 mg total) by mouth daily., Disp: 90 tablet, Rfl: 3   ferrous sulfate  325 (65 FE) MG tablet, TAKE 1 TABLET  (325 MG TOTAL) BY MOUTH 2 (TWO) TIMES DAILY WITH A MEAL., Disp: 60 tablet, Rfl: 5   ipratropium-albuterol  (DUONEB) 0.5-2.5 (3) MG/3ML SOLN, INHALE 3 ML BY NEBULIZER EVERY 6 HOURS AS NEEDED, Disp: 360 mL, Rfl: 10   OPSUMIT  10 MG tablet, Take 1 tablet daily., Disp: 30 tablet, Rfl: 11   OXYGEN , 2-3 lpm 24/7  AHC, Disp: , Rfl:    predniSONE  (DELTASONE ) 2.5 MG tablet, TAKE 1 TABLET BY MOUTH DAILY WITH BREAKFAST., Disp: 30 tablet, Rfl: 5   Treprostinil  (TYVASO  DPI TITRATION KIT) 16 & 32 & 48 MCG POWD, Inhale 16 mcg into the lungs in the morning, at noon, in the evening, and at bedtime., Disp: 112 each, Rfl: 11   VENTOLIN  HFA 108 (90 Base) MCG/ACT inhaler, INHALE 2 PUFFS INTO THE LUNGS EVERY 4 HOURS AS NEEDED FOR WHEEZE OR FOR SHORTNESS OF BREATH, Disp: 18 each, Rfl: 2  Current Facility-Administered Medications:    methylPREDNISolone  acetate (DEPO-MEDROL ) injection 80 mg, 80 mg, Intramuscular, Once, Cobb, Comer GAILS, NP   Medications ordered in this encounter:  Meds ordered this encounter  Medications   amoxicillin -clavulanate (AUGMENTIN ) 875-125 MG tablet    Sig: Take 1 tablet by mouth 2 (two) times daily.    Dispense:  20 tablet    Refill:  0    Supervising Provider:   BLAISE ALEENE KIDD [[id number]]   brompheniramine-pseudoephedrine-DM 30-2-10 MG/5ML syrup    Sig: Take 5 mLs by mouth 3 (  three) times daily as needed.    Dispense:  120 mL    Refill:  0    Supervising Provider:   BLAISE ALEENE KIDD [[id number]]     *If you need refills on other medications prior to your next appointment, please contact your pharmacy*  Follow-Up: Call back or seek an in-person evaluation if the symptoms worsen or if the condition fails to improve as anticipated.  [facility] [phone]  Other Instructions Upper Respiratory Infection, Adult An upper respiratory infection (URI) is a common viral infection of the nose, throat, and upper air passages that lead to the lungs. The most common type of  URI is the common cold. URIs usually get better on their own, without medical treatment. What are the causes? A URI is caused by a virus. You may catch a virus by: Breathing in droplets from an infected person's cough or sneeze. Touching something that has been exposed to the virus (is contaminated) and then touching your mouth, nose, or eyes. What increases the risk? You are more likely to get a URI if: You are very young or very old. You have close contact with others, such as at work, school, or a health care facility. You smoke. You have long-term (chronic) heart or lung disease. You have a weakened disease-fighting system (immune system). You have nasal allergies or asthma. You are experiencing a lot of stress. You have poor nutrition. What are the signs or symptoms? A URI usually involves some of the following symptoms: Runny or stuffy (congested) nose. Cough. Sneezing. Sore throat. Headache. Fatigue. Fever. Loss of appetite. Pain in your forehead, behind your eyes, and over your cheekbones (sinus pain). Muscle aches. Redness or irritation of the eyes. Pressure in the ears or face. How is this diagnosed? This condition may be diagnosed based on your medical history and symptoms, and a physical exam. Your health care provider may use a swab to take a mucus sample from your nose (nasal swab). This sample can be tested to determine what virus is causing the illness. How is this treated? URIs usually get better on their own within 7-10 days. Medicines cannot cure URIs, but your health care provider may recommend certain medicines to help relieve symptoms, such as: Over-the-counter cold medicines. Cough suppressants. Coughing is a type of defense against infection that helps to clear the respiratory system, so take these medicines only as recommended by your health care provider. Fever-reducing medicines. Follow these instructions at home: Activity Rest as needed. If you have  a fever, stay home from work or school until your fever is gone or until your health care provider says your URI cannot spread to other people (is no longer contagious). Your health care provider may have you wear a face mask to prevent your infection from spreading. Relieving symptoms Gargle with a mixture of salt and water 3-4 times a day or as needed. To make salt water, completely dissolve -1 tsp (3-6 g) of salt in 1 cup (237 mL) of warm water. Use a cool-mist humidifier to add moisture to the air. This can help you breathe more easily. Eating and drinking  Drink enough fluid to keep your urine pale yellow. Eat soups and other clear broths. General instructions  Take over-the-counter and prescription medicines only as told by your health care provider. These include cold medicines, fever reducers, and cough suppressants. Do not use any products that contain nicotine or tobacco. These products include cigarettes, chewing tobacco, and vaping devices, such  as e-cigarettes. If you need help quitting, ask your health care provider. Stay away from secondhand smoke. Stay up to date on all immunizations, including the yearly (annual) flu vaccine. Keep all follow-up visits. This is important. How to prevent the spread of infection to others URIs can be contagious. To prevent the infection from spreading: Wash your hands with soap and water for at least 20 seconds. If soap and water are not available, use hand sanitizer. Avoid touching your mouth, face, eyes, or nose. Cough or sneeze into a tissue or your sleeve or elbow instead of into your hand or into the air.  Contact a health care provider if: You are getting worse instead of better. You have a fever or chills. Your mucus is brown or red. You have yellow or brown discharge coming from your nose. You have pain in your face, especially when you bend forward. You have swollen neck glands. You have pain while swallowing. You have white areas  in the back of your throat. Get help right away if: You have shortness of breath that gets worse. You have severe or persistent: Headache. Ear pain. Sinus pain. Chest pain. You have chronic lung disease along with any of the following: Making high-pitched whistling sounds when you breathe, most often when you breathe out (wheezing). Prolonged cough (more than 14 days). Coughing up blood. A change in your usual mucus. You have a stiff neck. You have changes in your: Vision. Hearing. Thinking. Mood. These symptoms may be an emergency. Get help right away. Call 911. Do not wait to see if the symptoms will go away. Do not drive yourself to the hospital. Summary An upper respiratory infection (URI) is a common infection of the nose, throat, and upper air passages that lead to the lungs. A URI is caused by a virus. URIs usually get better on their own within 7-10 days. Medicines cannot cure URIs, but your health care provider may recommend certain medicines to help relieve symptoms. This information is not intended to replace advice given to you by your health care provider. Make sure you discuss any questions you have with your health care provider. Document Revised: 09/28/2020 Document Reviewed: 09/28/2020 Elsevier Patient Education  2024 Elsevier Inc.   If you have been instructed to have an in-person evaluation today at a local Urgent Care facility, please use the link below. It will take you to a list of all of our available [facility], including address, phone number and hours of operation. Please do not delay care.  [facility]  If you or a family member do not have a primary care provider, use the link below to schedule a visit and establish care. When you choose a [facility] primary care physician or advanced practice provider, you gain a long-term partner in health. Find a Primary Care Provider  Learn more about [facility]'s in-office and  virtual care options: [facility] - Get Care Now

## 2023-09-24 ENCOUNTER — Other Ambulatory Visit (HOSPITAL_COMMUNITY): Payer: Self-pay | Admitting: Cardiology

## 2023-09-27 DIAGNOSIS — J9602 Acute respiratory failure with hypercapnia: Secondary | ICD-10-CM | POA: Diagnosis not present

## 2023-09-27 DIAGNOSIS — J45909 Unspecified asthma, uncomplicated: Secondary | ICD-10-CM | POA: Diagnosis not present

## 2023-09-27 DIAGNOSIS — D869 Sarcoidosis, unspecified: Secondary | ICD-10-CM | POA: Diagnosis not present

## 2023-10-04 ENCOUNTER — Telehealth: Payer: Self-pay

## 2023-10-04 NOTE — Telephone Encounter (Signed)
 Copied from CRM #[id number]. Topic: Clinical - Order For Equipment >> Oct 02, 2023 12:58 PM Whitney O wrote: Reason for CRM: ms tony nurse calling from [employer] is calling to see about a portable oxygen  tank or input on how to place a order for patient she is not able to do a lot cause of how fast she uses the oxygen  tank . [phone] direct line to ms tony please give her a call back concerning a portable oxygen  tank for patient    LM - to return call ( patient would have to be seen in office to do a walk for the POC)   Please schedule appointment

## 2023-10-18 ENCOUNTER — Telehealth: Payer: Self-pay | Admitting: *Deleted

## 2023-10-18 NOTE — Telephone Encounter (Signed)
 Copied from CRM [id number]. Topic: General - Other >> Oct 17, 2023  1:01 PM Corean SAUNDERS wrote: Reason for CRM: Andree a nurse with [employer] [place]  is requesting a call back from [employer] to discuss this patient but did not provide further details. Please call nurse Andree back at [phone]   Florence Andree at the number provided and there was no answer- LMTCB.

## 2023-10-28 DIAGNOSIS — D869 Sarcoidosis, unspecified: Secondary | ICD-10-CM | POA: Diagnosis not present

## 2023-10-28 DIAGNOSIS — J45909 Unspecified asthma, uncomplicated: Secondary | ICD-10-CM | POA: Diagnosis not present

## 2023-10-28 DIAGNOSIS — J9602 Acute respiratory failure with hypercapnia: Secondary | ICD-10-CM | POA: Diagnosis not present

## 2023-11-06 NOTE — Telephone Encounter (Signed)
 Called Erin Zavala again and still no answer- LMTCB and will close encounter per protocol.

## 2023-11-19 ENCOUNTER — Encounter: Payer: Self-pay | Admitting: Physician Assistant

## 2023-11-19 ENCOUNTER — Telehealth: Admitting: Physician Assistant

## 2023-11-19 DIAGNOSIS — J441 Chronic obstructive pulmonary disease with (acute) exacerbation: Secondary | ICD-10-CM | POA: Diagnosis not present

## 2023-11-19 MED ORDER — PREDNISONE 20 MG PO TABS: 20.0000 mg | ORAL_TABLET | Freq: Two times a day (BID) | ORAL | 0 refills | Status: AC

## 2023-11-19 MED ORDER — AZITHROMYCIN 250 MG PO TABS: ORAL_TABLET | ORAL | 0 refills | Status: AC

## 2023-11-19 NOTE — Progress Notes (Signed)
 Virtual Visit Consent   Erin Zavala, you are scheduled for a virtual visit with a [facility] provider today. Just as with appointments in the office, your consent must be obtained to participate. Your consent will be active for this visit and any virtual visit you may have with one of our providers in the next 365 days. If you have a MyChart account, a copy of this consent can be sent to you electronically.  As this is a virtual visit, video technology does not allow for your provider to perform a traditional examination. This may limit your provider's ability to fully assess your condition. If your provider identifies any concerns that need to be evaluated in person or the need to arrange testing (such as labs, EKG, etc.), we will make arrangements to do so. Although advances in technology are sophisticated, we cannot ensure that it will always work on either your end or our end. If the connection with a video visit is poor, the visit may have to be switched to a telephone visit. With either a video or telephone visit, we are not always able to ensure that we have a secure connection.  By engaging in this virtual visit, you consent to the provision of healthcare and authorize for your insurance to be billed (if applicable) for the services provided during this visit. Depending on your insurance coverage, you may receive a charge related to this service.  I need to obtain your verbal consent now. Are you willing to proceed with your visit today? Erin Zavala has provided verbal consent on 11/19/2023 for a virtual visit (video or telephone). Tameem Pullara, PA-C  Date: 11/19/2023 1:18 PM   Virtual Visit via Video Note   I, Erin Zavala, connected with  Erin Zavala  ([id number], Feb 10, 1966) on 11/19/23 at 12:00 PM EDT by a video-enabled telemedicine application and verified that I am speaking with the correct person using two identifiers.  Location: Patient: Virtual Visit Location Patient:  Home Provider: Virtual Visit Location Provider: Home Office   I discussed the limitations of evaluation and management by telemedicine and the availability of in person appointments. The patient expressed understanding and agreed to proceed.    History of Present Illness: Erin Zavala is a 58 y.o. who identifies as a female who was assigned female at birth, and is being seen today for possible COPD exacerbation. Reports sinus congestion, wheezing  for the last 4-5 days. Increased cough, with clear to white mucus. Wheezing, when waking up in the mornings and late at night. Currently using neb, at home oxygen . Finger pulse ox at home HR is at 100, Pulse ox 93 with ambulation at rest 98%. At home oxygen  2.5/lpm, which is at base line as per patient. Denies fever, covid/flu exposure.  Last COPD flare: End of June. Denies ear pain, sore throat, nausea, vomiting.    HPI:  Problems:  Patient Active Problem List   Diagnosis Date Noted   Acute bronchitis with COPD (HCC) 03/30/2022   Acute rhinitis 06/16/2020   COPD (chronic obstructive pulmonary disease) (HCC) 09/18/2018   Cough 04/04/2018   Thrush 01/02/2018   Chronic respiratory failure with hypoxia (HCC) 08/19/2017   Coronary artery disease 07/23/2016   Health care maintenance 06/11/2016   Sinus congestion 03/23/2016   Hypoxemia 03/23/2016   Pulmonary hypertension (HCC) 12/28/2015   Acute bronchitis 12/28/2015   Childhood asthma    Pulmonary sarcoidosis (HCC) 11/02/2015   GERD (gastroesophageal reflux disease) 11/02/2015    Allergies:  Allergies  Allergen Reactions  Feraheme [Ferumoxytol ] Shortness Of Breath   Medications:  Current Outpatient Medications:    azithromycin  (ZITHROMAX ) 250 MG tablet, Take 2 tablets on day 1, then 1 tablet daily on days 2 through 5, Disp: 6 tablet, Rfl: 0   predniSONE  (DELTASONE ) 20 MG tablet, Take 1 tablet (20 mg total) by mouth 2 (two) times daily for 5 days., Disp: 10 tablet, Rfl: 0   ADCIRCA  20 MG  tablet, TAKE 2 TABLETS BY MOUTH 1 TIME A DAY, Disp: 60 tablet, Rfl: 11   amoxicillin -clavulanate (AUGMENTIN ) 875-125 MG tablet, Take 1 tablet by mouth 2 (two) times daily., Disp: 20 tablet, Rfl: 0   aspirin  81 MG tablet, Take 1 tablet (81 mg total) by mouth daily., Disp: 30 tablet, Rfl: 0   atorvastatin  (LIPITOR) 80 MG tablet, TAKE 1 TABLET EVERY DAY, Disp: 90 tablet, Rfl: 4   azelastine  (OPTIVAR ) 0.05 % ophthalmic solution, Place 1 drop into both eyes 2 (two) times daily., Disp: 6 mL, Rfl: 0   benzonatate  (TESSALON ) 200 MG capsule, Take 1 capsule (200 mg total) by mouth 3 (three) times daily as needed for cough., Disp: 30 capsule, Rfl: 1   BREO ELLIPTA  200-25 MCG/ACT AEPB, USE 1 INHALATION INTO THE LUNGS DAILY, Disp: 180 each, Rfl: 3   brompheniramine-pseudoephedrine-DM 30-2-10 MG/5ML syrup, Take 5 mLs by mouth 3 (three) times daily as needed., Disp: 120 mL, Rfl: 0   cetirizine  (ZYRTEC ) 10 MG tablet, TAKE 1 TABLET EVERY DAY, Disp: 90 tablet, Rfl: 4   esomeprazole  (NEXIUM ) 40 MG capsule, TAKE 1 CAPSULE EVERY DAY, Disp: 30 capsule, Rfl: 11   ezetimibe  (ZETIA ) 10 MG tablet, TAKE 1 TABLET DAILY, Disp: 90 tablet, Rfl: 3   ferrous sulfate  325 (65 FE) MG tablet, TAKE 1 TABLET (325 MG TOTAL) BY MOUTH 2 (TWO) TIMES DAILY WITH A MEAL., Disp: 60 tablet, Rfl: 5   ipratropium-albuterol  (DUONEB) 0.5-2.5 (3) MG/3ML SOLN, INHALE 3 ML BY NEBULIZER EVERY 6 HOURS AS NEEDED, Disp: 360 mL, Rfl: 10   OPSUMIT  10 MG tablet, Take 1 tablet daily., Disp: 30 tablet, Rfl: 11   OXYGEN , 2-3 lpm 24/7  AHC, Disp: , Rfl:    predniSONE  (DELTASONE ) 2.5 MG tablet, TAKE 1 TABLET BY MOUTH DAILY WITH BREAKFAST., Disp: 30 tablet, Rfl: 5   Treprostinil  (TYVASO  DPI TITRATION KIT) 16 & 32 & 48 MCG POWD, Inhale 16 mcg into the lungs in the morning, at noon, in the evening, and at bedtime., Disp: 112 each, Rfl: 11   VENTOLIN  HFA 108 (90 Base) MCG/ACT inhaler, INHALE 2 PUFFS INTO THE LUNGS EVERY 4 HOURS AS NEEDED FOR WHEEZE OR FOR SHORTNESS OF  BREATH, Disp: 18 each, Rfl: 2  Current Facility-Administered Medications:    methylPREDNISolone  acetate (DEPO-MEDROL ) injection 80 mg, 80 mg, Intramuscular, Once, Cobb, Comer GAILS, NP  Observations/Objective: Patient is well-developed, well-nourished in no acute distress.  Resting comfortably  at home. Using nasal-canal Head is normocephalic, atraumatic.  No labored breathing.  Speech is clear and coherent with logical content.  Patient is alert and oriented at baseline.  Appears congested but able to speak in full sentences Not actively coughing during examination  Assessment and Plan: 1. COPD exacerbation (HCC) (Primary) - azithromycin  (ZITHROMAX ) 250 MG tablet; Take 2 tablets on day 1, then 1 tablet daily on days 2 through 5  Dispense: 6 tablet; Refill: 0 - predniSONE  (DELTASONE ) 20 MG tablet; Take 1 tablet (20 mg total) by mouth 2 (two) times daily for 5 days.  Dispense: 10 tablet; Refill: 0  Medications  as directed, continue to monitor oxygen  saturation at home Get rest and adequate sleep  Drink plenty of water, broth, and other clear fluids to stay hydrated.  Use a cool-mist humidifier or take steamy showers to relieve congestion.  Elevate the head of the bed to help with post nasal drainage Sip warm liquids, gargle with salt water, use lozenges, or suck on hard candy.  Use over-the-counter medications like acetaminophen  (Tylenol ) or ibuprofen (Advil, Motrin) as needed for fever and pain, IF no contraindications Use saline nasal sprays or washes.  Over the counter mucinex , max strength ( blue and white box) to help loosen sinus congestion.   Strict precautions discussed with patient, given history and current symptoms, ER if no change or worsening symptoms, onset of fever, no relief with neb, drop in O2 sats    PCP and/OR Pulmonary follow-up in 5 day   Follow Up Instructions: I discussed the assessment and treatment plan with the patient. The patient was provided an  opportunity to ask questions and all were answered. The patient agreed with the plan and demonstrated an understanding of the instructions.  A copy of instructions were sent to the patient via MyChart unless otherwise noted below.    The patient was advised to call back or seek an in-person evaluation if the symptoms worsen or if the condition fails to improve as anticipated.    Foster Sonnier, PA-C

## 2023-11-19 NOTE — Patient Instructions (Addendum)
 Nena Hilts, thank you for joining Varick Keys, PA-C for today's virtual visit.  While this provider is not your primary care provider (PCP), if your PCP is located in our provider database this encounter information will be shared with them immediately following your visit.   A [facility] MyChart account gives you access to today's visit and all your visits, tests, and labs performed at [facility]  click here if you don't have a [facility] MyChart account or go to [email or web address]  Consent: (Patient) Erin Zavala provided verbal consent for this virtual visit at the beginning of the encounter.  Current Medications:  Current Outpatient Medications:    ADCIRCA  20 MG tablet, TAKE 2 TABLETS BY MOUTH 1 TIME A DAY, Disp: 60 tablet, Rfl: 11   amoxicillin -clavulanate (AUGMENTIN ) 875-125 MG tablet, Take 1 tablet by mouth 2 (two) times daily., Disp: 20 tablet, Rfl: 0   aspirin  81 MG tablet, Take 1 tablet (81 mg total) by mouth daily., Disp: 30 tablet, Rfl: 0   atorvastatin  (LIPITOR) 80 MG tablet, TAKE 1 TABLET EVERY DAY, Disp: 90 tablet, Rfl: 4   azelastine  (OPTIVAR ) 0.05 % ophthalmic solution, Place 1 drop into both eyes 2 (two) times daily., Disp: 6 mL, Rfl: 0   benzonatate  (TESSALON ) 200 MG capsule, Take 1 capsule (200 mg total) by mouth 3 (three) times daily as needed for cough., Disp: 30 capsule, Rfl: 1   BREO ELLIPTA  200-25 MCG/ACT AEPB, USE 1 INHALATION INTO THE LUNGS DAILY, Disp: 180 each, Rfl: 3   brompheniramine-pseudoephedrine-DM 30-2-10 MG/5ML syrup, Take 5 mLs by mouth 3 (three) times daily as needed., Disp: 120 mL, Rfl: 0   cetirizine  (ZYRTEC ) 10 MG tablet, TAKE 1 TABLET EVERY DAY, Disp: 90 tablet, Rfl: 4   esomeprazole  (NEXIUM ) 40 MG capsule, TAKE 1 CAPSULE EVERY DAY, Disp: 30 capsule, Rfl: 11   ezetimibe  (ZETIA ) 10 MG tablet, TAKE 1 TABLET DAILY, Disp: 90 tablet, Rfl: 3   ferrous sulfate  325 (65 FE) MG tablet, TAKE 1 TABLET (325 MG TOTAL) BY MOUTH 2 (TWO)  TIMES DAILY WITH A MEAL., Disp: 60 tablet, Rfl: 5   ipratropium-albuterol  (DUONEB) 0.5-2.5 (3) MG/3ML SOLN, INHALE 3 ML BY NEBULIZER EVERY 6 HOURS AS NEEDED, Disp: 360 mL, Rfl: 10   OPSUMIT  10 MG tablet, Take 1 tablet daily., Disp: 30 tablet, Rfl: 11   OXYGEN , 2-3 lpm 24/7  AHC, Disp: , Rfl:    predniSONE  (DELTASONE ) 2.5 MG tablet, TAKE 1 TABLET BY MOUTH DAILY WITH BREAKFAST., Disp: 30 tablet, Rfl: 5   Treprostinil  (TYVASO  DPI TITRATION KIT) 16 & 32 & 48 MCG POWD, Inhale 16 mcg into the lungs in the morning, at noon, in the evening, and at bedtime., Disp: 112 each, Rfl: 11   VENTOLIN  HFA 108 (90 Base) MCG/ACT inhaler, INHALE 2 PUFFS INTO THE LUNGS EVERY 4 HOURS AS NEEDED FOR WHEEZE OR FOR SHORTNESS OF BREATH, Disp: 18 each, Rfl: 2  Current Facility-Administered Medications:    methylPREDNISolone  acetate (DEPO-MEDROL ) injection 80 mg, 80 mg, Intramuscular, Once, Cobb, Katherine V, NP   Medications ordered in this encounter:  No orders of the defined types were placed in this encounter.    *If you need refills on other medications prior to your next appointment, please contact your pharmacy*  Follow-Up: Call back or seek an in-person evaluation if the symptoms worsen or if the condition fails to improve as anticipated.  [facility] [phone]  Other Instructions  Medications as directed, continue to monitor oxygen  saturation at  home Get rest and adequate sleep  Drink plenty of water, broth, and other clear fluids to stay hydrated.  Use a cool-mist humidifier or take steamy showers to relieve congestion.  Elevate the head of the bed to help with post nasal drainage Sip warm liquids, gargle with salt water, use lozenges, or suck on hard candy.  Use over-the-counter medications like acetaminophen  (Tylenol ) or ibuprofen (Advil, Motrin) as needed for fever and pain, IF no contraindications Use saline nasal sprays or washes.  Over the counter mucinex , max strength ( blue and  white box) to help loosen sinus congestion.    Strict precautions discussed with patient, given history and current symptoms, ER if no change or worsening symptoms, onset of fever, no relief with neb, drop in O2 sats    PCP and/OR Pulmonary follow-up in 5 day  If you have been instructed to have an in-person evaluation today at a local Urgent Care facility, please use the link below. It will take you to a list of all of our available [facility], including address, phone number and hours of operation. Please do not delay care.  [facility]  If you or a family member do not have a primary care provider, use the link below to schedule a visit and establish care. When you choose a [facility] primary care physician or advanced practice provider, you gain a long-term partner in health. Find a Primary Care Provider  Learn more about [facility]'s in-office and virtual care options: [facility] - Get Care Now

## 2023-11-28 DIAGNOSIS — J45909 Unspecified asthma, uncomplicated: Secondary | ICD-10-CM | POA: Diagnosis not present

## 2023-11-28 DIAGNOSIS — D869 Sarcoidosis, unspecified: Secondary | ICD-10-CM | POA: Diagnosis not present

## 2023-11-28 DIAGNOSIS — J9602 Acute respiratory failure with hypercapnia: Secondary | ICD-10-CM | POA: Diagnosis not present

## 2023-12-28 DIAGNOSIS — J9602 Acute respiratory failure with hypercapnia: Secondary | ICD-10-CM | POA: Diagnosis not present

## 2023-12-28 DIAGNOSIS — J45909 Unspecified asthma, uncomplicated: Secondary | ICD-10-CM | POA: Diagnosis not present

## 2024-01-16 ENCOUNTER — Other Ambulatory Visit: Payer: Self-pay | Admitting: Emergency Medicine

## 2024-01-16 DIAGNOSIS — D860 Sarcoidosis of lung: Secondary | ICD-10-CM

## 2024-01-16 DIAGNOSIS — K219 Gastro-esophageal reflux disease without esophagitis: Secondary | ICD-10-CM

## 2024-01-16 NOTE — Telephone Encounter (Unsigned)
 Copied from CRM #[id number]. Topic: Clinical - Medication Refill >> Jan 16, 2024  4:40 PM Abigail D wrote: Medication: predniSONE  (DELTASONE ) 2.5 MG tablet [[id number]] ipratropium-albuterol  (DUONEB) 0.5-2.5 (3) MG/3ML SOLN [[id number]] esomeprazole  (NEXIUM ) 40 MG capsule [[id number]]  Has the patient contacted their pharmacy? No (Agent: If no, request that the patient contact the pharmacy for the refill. If patient does not wish to contact the pharmacy document the reason why and proceed with request.) (Agent: If yes, when and what did the pharmacy advise?)  This is the patient's preferred pharmacy:  [id number] - [place], [place] - [address] AT [facility] [address] [place] [address] Phone: [phone] Fax: [phone]  Is this the correct pharmacy for this prescription? Yes If no, delete pharmacy and type the correct one.   Has the prescription been filled recently? No  Is the patient out of the medication? Yes, not out of prednisone  but out of the other 2.  Has the patient been seen for an appointment in the last year OR does the patient have an upcoming appointment? Yes  Can we respond through MyChart? Yes  Agent: Please be advised that Rx refills may take up to 3 business days. We ask that you follow-up with your pharmacy.

## 2024-02-04 ENCOUNTER — Other Ambulatory Visit: Payer: Self-pay | Admitting: Nurse Practitioner

## 2024-02-04 DIAGNOSIS — D860 Sarcoidosis of lung: Secondary | ICD-10-CM

## 2024-02-04 NOTE — Telephone Encounter (Signed)
 Pt has not been seen in over one year. Must keep 02/13/24 appt with Izetta Rouleau for further refills.

## 2024-02-13 ENCOUNTER — Ambulatory Visit

## 2024-02-13 ENCOUNTER — Ambulatory Visit: Admitting: Nurse Practitioner

## 2024-02-13 ENCOUNTER — Encounter: Payer: Self-pay | Admitting: Nurse Practitioner

## 2024-02-13 VITALS — BP 110/70 | HR 89 | Temp 97.5°F | Ht 60.0 in | Wt 154.8 lb

## 2024-02-13 DIAGNOSIS — R918 Other nonspecific abnormal finding of lung field: Secondary | ICD-10-CM | POA: Diagnosis not present

## 2024-02-13 DIAGNOSIS — J984 Other disorders of lung: Secondary | ICD-10-CM | POA: Diagnosis not present

## 2024-02-13 DIAGNOSIS — D860 Sarcoidosis of lung: Secondary | ICD-10-CM

## 2024-02-13 DIAGNOSIS — J9610 Chronic respiratory failure, unspecified whether with hypoxia or hypercapnia: Secondary | ICD-10-CM | POA: Diagnosis not present

## 2024-02-13 DIAGNOSIS — I272 Pulmonary hypertension, unspecified: Secondary | ICD-10-CM

## 2024-02-13 DIAGNOSIS — R32 Unspecified urinary incontinence: Secondary | ICD-10-CM

## 2024-02-13 DIAGNOSIS — J439 Emphysema, unspecified: Secondary | ICD-10-CM

## 2024-02-13 DIAGNOSIS — J4489 Other specified chronic obstructive pulmonary disease: Secondary | ICD-10-CM | POA: Diagnosis not present

## 2024-02-13 DIAGNOSIS — Z87891 Personal history of nicotine dependence: Secondary | ICD-10-CM

## 2024-02-13 DIAGNOSIS — J9611 Chronic respiratory failure with hypoxia: Secondary | ICD-10-CM

## 2024-02-13 MED ORDER — SPIRIVA RESPIMAT 2.5 MCG/ACT IN AERS: 2.0000 | INHALATION_SPRAY | Freq: Every day | RESPIRATORY_TRACT | 5 refills | Status: AC

## 2024-02-13 NOTE — Patient Instructions (Addendum)
 Continue Albuterol  inhaler 2 puffs or 3 mL neb every 6 hours as needed for shortness of breath or wheezing. Notify if symptoms persist despite rescue inhaler/neb use. Continue zyrtec  1 tab daily Continue nexium  once daily  Continue breo 1 puff daily Continue supplemental oxygen  2-3 lpm for goal >88-90% Continue Tyvaso , Opsumit , and Adcirca  as prescribed by Dr. Mclean Continue prednisone  2.5 mg daily in morning   Start Spiriva 2 puffs daily. Notify of any vision changes or urinary problems. This will be an extra maintenance medication that you will use with your Breo Use guaifenesin  (mucinex ) over the counter twice daily as needed for congestion Try taking a spoonful of yogurt, honey or peanut butter before you do your Tyvaso  to see if this helps with the coughing response  Talk to your primary care doctor about the urinary incontinence episodes. I would suggest starting some pelvic floor therapy exercises as well, which you can lookup online   Chest x ray today for yearly follow up   Follow up in 6-8 weeks with Dr. Shelah. If symptoms do not improve or worsen, please contact office for sooner follow up or seek emergency care.

## 2024-02-13 NOTE — Progress Notes (Signed)
 @Patient  ID: Erin Zavala, female    DOB: 1965-11-13, 58 y.o.   MRN: [id number]  Chief Complaint  Patient presents with   Follow-up    Annual follow up    Referring provider: [facility]  HPI: 58 year old female, former smoker followed for pulmonary sarcoidosis, chronic respiratory failure, COPD, and pulmonary hypertension. She is a patient of Dr. Lanny and last seen in office 11/2022 by [facility] NP. Past medical history significant for CAD, allergic rhinitis, GERD.  TESTS/EVENTS: 12/26/2021 HRCT chest: CAD/atherosclerosis. Prominent mediastinal and hilar nodes, many partially calcified. Chronic stigmata in lungs compatible with known hx of sarcoidosis, minimal progression when compared to prior imaging 02/07/2022 PFT: FVC 59, FEV1 62, ratio 81, TLC 56, DLCO 29. Moderate restriction with very severe diffusion defect  05/08/2022 echo: EF 60-65%. GIDD. RV function and size normal. Normal PASP. Trivial MR.    02/07/2022: OV with Dr. Shelah. Evidence of stage III sarcoidosis changes. Titrating down on her steroids; now on 1 mg daily. May be reasonable to wean off and see how she does. Change to every other day. Reviewed symptoms of adrenal insufficiency. Continued on Breo for management of COPD. Followed by Dr. Mclean for [facility].    03/29/2022: SHERLEAN with Shade Rivenbark NP for acute virtual visit. She tells me that over the past two weeks or so, she has had a persistent cough with yellow sputum production. She had some nasal congestion and postnasal drainage as well. She did an e-visit with her PCP who prescribed azithromycin  and benzonatate  on 1/14. She tells me today that she is feeling a tad better, cough seems to be a little clear on the azithromycin  (one dose left) but she is still coughing and it hurts at times in her chest when she does. She feels she may need a course of steroids. She denies fevers, chills, hemoptysis, wheezing, increased shortness of breath. She is using her Breo inhaler. No  increased O2 requirement. She admits adherence to her PAH regimen; no increased weight gain, swelling, or orthopnea.   11/19/2022: OV with Arnella Pralle NP Patient presents today for overdue follow up but she is also having some acute symptoms. She has been sick for the last month. She did a video visit 8/7 and received doxycycline  and prednisone . She felt better when she was on the steroids but symptoms returned shortly after completing. She feels more short winded than her usual. She is coughing frequently. Occasionally produces a small amount of clear phlegm. She also has sinus congestion and drainage, which is also clear. She does notice an occasional wheeze. Denies fevers, chills, hemoptysis, leg swelling, weight gain, anorexia, CP, lightheadedness/dizziness. No visual changes or skin lesions. She did COVID test when symptoms started and this was negative. She has been off of daily prednisone . Felt better when she was on it. She also had a flare up in April 2024 requiring steroids and abx. She is using her Breo inhaler daily. Using her rescue a few times a day. She was taking tessalon  but it doesn't always help with her cough. She did run out of nexium  a few weeks ago too. Still taking her daily allergy pill. No increased O2 requirement. Compliant with her PH meds.   02/13/2024: Today - follow up Discussed the use of AI scribe software for clinical note transcription with the patient, who gave verbal consent to proceed.  History of Present Illness Erin Zavala is a 58 year old female with sarcoidosis, COPD and pulmonary hypertension who presents for  yearly follow up.  She experiences breathing difficulties with occasional flares, particularly in moist weather/weather changes. Her last exacerbation requiring increased steroids/abx was in September and prior to this, June. No hospitalizations. Her breathing today feels stable although she is a little more raspy with the recent rain. Minimal cough with clear  phlegm. No wheezing, fevers, hemoptysis, orthopnea, PND, leg swelling, CP.   She is currently on Breo, one puff daily, and uses Tyvaso , Opsumit , and Adcirca . Tyvaso  causes a cough and dryness, leading her to not use it as consistently.   She has also been having issues with urinary incontinence for many months now, particularly when coughing or during 'spells'. She plans to discuss this with her PCP. She denies dysuria, frequency, hematuria, flank pain. No new medications around the onset of symptoms.   She is on 2 liters of oxygen , which is her baseline.   She is currently taking prednisone  2.5 mg daily.     Allergies  Allergen Reactions   Feraheme [Ferumoxytol ] Shortness Of Breath    Immunization History  Administered Date(s) Administered   Influenza,inj,Quad PF,6+ Mos 02/28/2016, 12/18/2016, 01/02/2018, 11/04/2018, 12/24/2019, 12/20/2021   PPD Test 02/28/2016   Pneumococcal Conjugate-13 11/29/2016   Pneumococcal Polysaccharide-23 11/03/2015    Past Medical History:  Diagnosis Date   Asthma    as a child   CHF (congestive heart failure) (HCC)    Cough    Dyspnea    GERD (gastroesophageal reflux disease)    Migraines    Oxygen  dependent    Pulmonary hypertension (HCC)    Sarcoidosis     Tobacco History: Social History   Tobacco Use  Smoking Status Former   Current packs/day: 0.00   Average packs/day: 0.3 packs/day for 24.8 years (6.2 ttl pk-yrs)   Types: Cigarettes   Start date: 34   Quit date: 01/11/2011   Years since quitting: 13.0  Smokeless Tobacco Never   Counseling given: Not Answered   Outpatient Medications Prior to Visit  Medication Sig Dispense Refill   ADCIRCA  20 MG tablet TAKE 2 TABLETS BY MOUTH 1 TIME A DAY 60 tablet 11   aspirin  81 MG tablet Take 1 tablet (81 mg total) by mouth daily. 30 tablet 0   atorvastatin  (LIPITOR) 80 MG tablet TAKE 1 TABLET EVERY DAY 90 tablet 4   BREO ELLIPTA  200-25 MCG/ACT AEPB USE 1 INHALATION INTO THE LUNGS DAILY  180 each 3   cetirizine  (ZYRTEC ) 10 MG tablet TAKE 1 TABLET EVERY DAY 90 tablet 4   esomeprazole  (NEXIUM ) 40 MG capsule TAKE 1 CAPSULE EVERY DAY 30 capsule 11   ezetimibe  (ZETIA ) 10 MG tablet TAKE 1 TABLET DAILY 90 tablet 3   ferrous sulfate  325 (65 FE) MG tablet TAKE 1 TABLET (325 MG TOTAL) BY MOUTH 2 (TWO) TIMES DAILY WITH A MEAL. 60 tablet 5   ipratropium-albuterol  (DUONEB) 0.5-2.5 (3) MG/3ML SOLN INHALE 3 ML BY NEBULIZER EVERY 6 HOURS AS NEEDED 360 mL 10   OPSUMIT  10 MG tablet Take 1 tablet daily. 30 tablet 11   OXYGEN  2-3 lpm 24/7  AHC     predniSONE  (DELTASONE ) 2.5 MG tablet TAKE 1 TABLET BY MOUTH DAILY WITH BREAKFAST. 30 tablet 0   Treprostinil  (TYVASO  DPI TITRATION KIT) 16 & 32 & 48 MCG POWD Inhale 16 mcg into the lungs in the morning, at noon, in the evening, and at bedtime. 112 each 11   VENTOLIN  HFA 108 (90 Base) MCG/ACT inhaler INHALE 2 PUFFS INTO THE LUNGS EVERY 4 HOURS AS NEEDED  FOR WHEEZE OR FOR SHORTNESS OF BREATH 18 each 2   brompheniramine-pseudoephedrine-DM 30-2-10 MG/5ML syrup Take 5 mLs by mouth 3 (three) times daily as needed. 120 mL 0   amoxicillin -clavulanate (AUGMENTIN ) 875-125 MG tablet Take 1 tablet by mouth 2 (two) times daily. 20 tablet 0   azelastine  (OPTIVAR ) 0.05 % ophthalmic solution Place 1 drop into both eyes 2 (two) times daily. 6 mL 0   benzonatate  (TESSALON ) 200 MG capsule Take 1 capsule (200 mg total) by mouth 3 (three) times daily as needed for cough. 30 capsule 1   Facility-Administered Medications Prior to Visit  Medication Dose Route Frequency Provider Last Rate Last Admin   methylPREDNISolone  acetate (DEPO-MEDROL ) injection 80 mg  80 mg Intramuscular Once Tallin Hart, Comer GAILS, NP         Review of Systems: as above    Physical Exam:  BP 110/70   Pulse 89   Temp (!) 97.5 F (36.4 C) (Oral)   Ht 5' (1.524 m)   Wt 154 lb 12.8 oz (70.2 kg)   SpO2 99% Comment: 2L  BMI 30.23 kg/m   GEN: Pleasant, interactive, chronically-ill appearing; in no  acute distress. HEENT:  Normocephalic and atraumatic. PERRLA. Sclera white. Nasal turbinates pink, moist and patent bilaterally. No rhinorrhea present. Oropharynx pink and moist, without exudate or edema. No lesions, ulcerations, or postnasal drip.  NECK:  Supple w/ fair ROM. No JVD present.  No lymphadenopathy.   CV: RRR, no m/r/g, no peripheral edema. Pulses intact, +2 bilaterally. No cyanosis, pallor or clubbing. PULMONARY:  Unlabored, regular breathing. Diminished bibasilar airflow otherwise clear w/o wheezes/rales/rhonchi. No accessory muscle use.  GI: BS present and normoactive. Soft, non-tender to palpation.  MSK: No erythema, warmth or tenderness. Cap refil <2 sec all extrem.  Neuro: A/Ox3. No focal deficits noted.   Skin: Warm, no lesions or rashe Psych: Normal affect and behavior. Judgement and thought content appropriate.     Lab Results:  CBC    Component Value Date/Time   WBC 8.1 05/17/2023 1446   RBC 4.43 05/17/2023 1446   HGB 11.1 (L) 05/17/2023 1446   HCT 36.6 05/17/2023 1446   PLT 282 05/17/2023 1446   MCV 82.6 05/17/2023 1446   MCH 25.1 (L) 05/17/2023 1446   MCHC 30.3 05/17/2023 1446   RDW 17.4 (H) 05/17/2023 1446   LYMPHSABS 1.3 11/19/2022 1539   MONOABS 0.7 11/19/2022 1539   EOSABS 1.2 (H) 11/19/2022 1539   BASOSABS 0.0 11/19/2022 1539    BMET    Component Value Date/Time   NA 140 05/17/2023 1446   K 4.0 05/17/2023 1446   CL 105 05/17/2023 1446   CO2 25 05/17/2023 1446   GLUCOSE 96 05/17/2023 1446   BUN 6 05/17/2023 1446   CREATININE 0.74 05/17/2023 1446   CALCIUM  9.2 05/17/2023 1446   GFRNONAA >60 05/17/2023 1446   GFRAA >60 03/16/2019 1505    BNP    Component Value Date/Time   BNP 41.3 05/17/2023 1446     Imaging:  No results found.  Administration History     None          Latest Ref Rng & Units 02/07/2022    9:59 AM 06/26/2017   10:01 AM 11/28/2016   11:00 AM 08/23/2016   11:58 AM 12/27/2015   10:46 AM  PFT Results   FVC-Pre L 1.75  1.61  1.69  1.70  1.53   FVC-Predicted Pre % 59  66  68  69  61   FVC-Post  L 1.76  1.67    1.56   FVC-Predicted Post % 59  68    63   Pre FEV1/FVC % % 82  84  87  87  79   Post FEV1/FCV % % 81  87    74   FEV1-Pre L 1.44  1.35  1.46  1.49  1.21   FEV1-Predicted Pre % 62  69  74  75  60   FEV1-Post L 1.43  1.46    1.16   DLCO uncorrected ml/min/mmHg 5.32  6.52  5.26  5.96  3.85   DLCO UNC% % 29  34  27  31  20    DLCO corrected ml/min/mmHg 5.32   5.23  5.70    DLCO COR %Predicted % 29   27  30     DLVA Predicted % 47  59  51  54  47   TLC L 2.51  2.80    2.62   TLC % Predicted % 56  62    58   RV % Predicted % 48  78    70     No results found for: NITRICOXIDE      Assessment & Plan:   Assessment & Plan COPD with chronic bronchitis and emphysema Pulmonary sarcoidosis  Intermittent flares, particularly with weather changes and moisture. Stable today. Two exacerbations in last six months. Currently on Breo 200 mcg daily. Will step up to triple therapy with addition of Spiriva . Side effect profile reviewed. Teachback performed. Action plan in place. Mucociliary clearance therapies encouraged.  - Added Spiriva , two puffs once daily - Continue Breo, one puff daily. - Educated on Spiriva  inhaler use. - Monitor for side effects including vision changes or urinary problems. - Use Mucinex  as needed for congestion. - Repeat CXR today   Pulmonary hypertension Managed with Tyvaso , Opsumit , and Adcirca . Experiencing cough and dryness with Tyvaso  use, leading to inconsistent use. Euvolemic. Advised to add on yogurt, honey, or peanut butter to help reduce upper airway irritation with Tyvaso  use. Follow up with HF clinic as scheduled  - Use Tyvaso  consistently with pre-treatment of peanut butter, honey, and yogurt to coat airways. - Continue current medications: Tyvaso , Opsumit , and Adcirca .  Incontinence  Advised to discuss with PCP. Encouraged pelvic floor therapy  exercises. No acute symptoms.  - Follow up with PCP   Chronic respiratory failure On supplemental oxygen . No increased requirements. Goal >88-90% - Continue supplemental oxygen  use      I spent 35 minutes of dedicated to the care of this patient on the date of this encounter to include pre-visit review of records, face-to-face time with the patient discussing conditions above, post visit ordering of testing, clinical documentation with the electronic health record, making appropriate referrals as documented, and communicating necessary findings to members of the patients care team.  Comer LULLA Rouleau, NP 02/13/2024  Pt aware and understands NP's role.

## 2024-02-17 ENCOUNTER — Ambulatory Visit: Payer: Self-pay | Admitting: Nurse Practitioner

## 2024-02-26 ENCOUNTER — Ambulatory Visit: Admitting: Nurse Practitioner

## 2024-03-06 ENCOUNTER — Other Ambulatory Visit (HOSPITAL_COMMUNITY): Payer: Self-pay | Admitting: Cardiology

## 2024-03-16 ENCOUNTER — Other Ambulatory Visit: Payer: Self-pay | Admitting: Nurse Practitioner

## 2024-03-16 DIAGNOSIS — D860 Sarcoidosis of lung: Secondary | ICD-10-CM

## 2024-04-10 ENCOUNTER — Ambulatory Visit (HOSPITAL_COMMUNITY): Admitting: Cardiology

## 2024-04-10 ENCOUNTER — Other Ambulatory Visit (HOSPITAL_COMMUNITY): Payer: Self-pay

## 2024-04-17 ENCOUNTER — Ambulatory Visit: Admitting: Emergency Medicine

## 2024-04-17 ENCOUNTER — Other Ambulatory Visit (HOSPITAL_COMMUNITY): Payer: Self-pay

## 2024-04-17 ENCOUNTER — Other Ambulatory Visit (HOSPITAL_COMMUNITY): Payer: Self-pay | Admitting: Cardiology

## 2024-04-23 ENCOUNTER — Ambulatory Visit (HOSPITAL_COMMUNITY): Payer: Self-pay

## 2024-05-29 ENCOUNTER — Ambulatory Visit: Admitting: Emergency Medicine
